# Patient Record
Sex: Male | Born: 1960
Health system: Southern US, Community
[De-identification: ages and names within clinical notes are randomized; demographics above are authoritative.]

## PROBLEM LIST (undated history)

## (undated) DIAGNOSIS — E119 Type 2 diabetes mellitus without complications: Secondary | ICD-10-CM

## (undated) DIAGNOSIS — E785 Hyperlipidemia, unspecified: Secondary | ICD-10-CM

## (undated) DIAGNOSIS — I10 Essential (primary) hypertension: Secondary | ICD-10-CM

## (undated) HISTORY — DX: Hyperlipidemia, unspecified: E78.5

## (undated) HISTORY — PX: COLONOSCOPY: SHX174

## (undated) HISTORY — DX: Type 2 diabetes mellitus without complications: E11.9

---

## 1998-04-06 ENCOUNTER — Encounter: Admission: RE | Admit: 1998-04-06 | Discharge: 1998-07-05 | Payer: Self-pay | Admitting: Family Medicine

## 2002-03-23 ENCOUNTER — Encounter: Admission: RE | Admit: 2002-03-23 | Discharge: 2002-06-21 | Payer: Self-pay | Admitting: Family Medicine

## 2002-07-06 ENCOUNTER — Encounter: Admission: RE | Admit: 2002-07-06 | Discharge: 2002-10-04 | Payer: Self-pay | Admitting: Family Medicine

## 2008-06-27 ENCOUNTER — Inpatient Hospital Stay (HOSPITAL_COMMUNITY): Admission: EM | Admit: 2008-06-27 | Discharge: 2008-07-03 | Payer: Self-pay | Admitting: Emergency Medicine

## 2008-06-29 ENCOUNTER — Ambulatory Visit: Payer: Self-pay | Admitting: Physical Medicine & Rehabilitation

## 2008-09-07 ENCOUNTER — Ambulatory Visit (HOSPITAL_COMMUNITY): Admission: RE | Admit: 2008-09-07 | Discharge: 2008-09-07 | Payer: Self-pay | Admitting: Urology

## 2010-02-27 ENCOUNTER — Encounter
Admission: RE | Admit: 2010-02-27 | Discharge: 2010-02-27 | Payer: Self-pay | Source: Home / Self Care | Attending: Sports Medicine | Admitting: Sports Medicine

## 2010-06-09 LAB — URINE CULTURE
Colony Count: NO GROWTH
Special Requests: NEGATIVE

## 2010-06-09 LAB — BASIC METABOLIC PANEL
CO2: 24 mEq/L (ref 19–32)
Chloride: 110 mEq/L (ref 96–112)
Creatinine, Ser: 0.99 mg/dL (ref 0.4–1.5)
GFR calc Af Amer: >60 mL/min (ref >60)
Glucose, Bld: 105 mg/dL — ABNORMAL HIGH (ref 70–99)
Potassium: 5 mEq/L (ref 3.5–5.1)
Sodium: 141 mEq/L (ref 135–145)

## 2010-06-09 LAB — GLUCOSE, CAPILLARY
Glucose-Capillary: 100 mg/dL — ABNORMAL HIGH (ref 70–99)
Glucose-Capillary: 116 mg/dL — ABNORMAL HIGH (ref 70–99)

## 2010-06-11 LAB — CARDIAC PANEL(CRET KIN+CKTOT+MB+TROPI)
CK, MB: 1.2 ng/mL (ref 0.3–4.0)
CK, MB: 1.2 ng/mL (ref 0.3–4.0)
Total CK: 543 U/L — ABNORMAL HIGH (ref 7–232)

## 2010-06-11 LAB — BASIC METABOLIC PANEL
BUN: 24 mg/dL — ABNORMAL HIGH (ref 6–23)
BUN: 26 mg/dL — ABNORMAL HIGH (ref 6–23)
Chloride: 102 mEq/L (ref 96–112)
Creatinine, Ser: 1.15 mg/dL (ref 0.4–1.5)
GFR calc Af Amer: >60 mL/min (ref >60)
GFR calc non Af Amer: >60 mL/min (ref >60)
Potassium: 5.1 mEq/L (ref 3.5–5.1)
Potassium: 5.2 mEq/L — ABNORMAL HIGH (ref 3.5–5.1)
Sodium: 141 mEq/L (ref 135–145)

## 2010-06-11 LAB — GLUCOSE, CAPILLARY
Glucose-Capillary: 137 mg/dL — ABNORMAL HIGH (ref 70–99)
Glucose-Capillary: 145 mg/dL — ABNORMAL HIGH (ref 70–99)
Glucose-Capillary: 91 mg/dL (ref 70–99)
Glucose-Capillary: 99 mg/dL (ref 70–99)

## 2010-06-11 LAB — CBC
HCT: 32.5 % — ABNORMAL LOW (ref 39.0–52.0)
HCT: 33.1 % — ABNORMAL LOW (ref 39.0–52.0)
Hemoglobin: 10.9 g/dL — ABNORMAL LOW (ref 13.0–17.0)
MCHC: 32.8 g/dL (ref 30.0–36.0)
MCV: 92.8 fL (ref 78.0–100.0)
Platelets: 295 10*3/uL (ref 150–400)
Platelets: 301 10*3/uL (ref 150–400)
RBC: 3.54 MIL/uL — ABNORMAL LOW (ref 4.22–5.81)
RBC: 3.57 MIL/uL — ABNORMAL LOW (ref 4.22–5.81)
WBC: 7.3 10*3/uL (ref 4.0–10.5)

## 2010-06-11 LAB — PROTIME-INR
INR: 1.8 — ABNORMAL HIGH (ref 0.00–1.49)
Prothrombin Time: 21.4 seconds — ABNORMAL HIGH (ref 11.6–15.2)
Prothrombin Time: 22.6 seconds — ABNORMAL HIGH (ref 11.6–15.2)

## 2010-06-11 LAB — BRAIN NATRIURETIC PEPTIDE: Pro B Natriuretic peptide (BNP): <30 pg/mL (ref 0.0–100.0)

## 2010-06-11 LAB — APTT: aPTT: 59 seconds — ABNORMAL HIGH (ref 24–37)

## 2010-06-12 LAB — PROTIME-INR
INR: 1 (ref 0.00–1.49)
INR: 1.2 (ref 0.00–1.49)
INR: 1.3 (ref 0.00–1.49)
Prothrombin Time: 15.2 seconds (ref 11.6–15.2)
Prothrombin Time: 16.4 seconds — ABNORMAL HIGH (ref 11.6–15.2)

## 2010-06-12 LAB — GLUCOSE, CAPILLARY
Glucose-Capillary: 115 mg/dL — ABNORMAL HIGH (ref 70–99)
Glucose-Capillary: 177 mg/dL — ABNORMAL HIGH (ref 70–99)
Glucose-Capillary: 187 mg/dL — ABNORMAL HIGH (ref 70–99)
Glucose-Capillary: 204 mg/dL — ABNORMAL HIGH (ref 70–99)
Glucose-Capillary: 92 mg/dL (ref 70–99)
Glucose-Capillary: 92 mg/dL (ref 70–99)

## 2010-06-12 LAB — CBC
MCV: 91.4 fL (ref 78.0–100.0)
Platelets: 220 10*3/uL (ref 150–400)
WBC: 8.2 10*3/uL (ref 4.0–10.5)

## 2010-07-16 NOTE — Discharge Summary (Signed)
NAMEEARSEL, SHOUSE NO.:  [id number]   MEDICAL RECORD NO.:  [id number]          PATIENT TYPE:  INP   LOCATION:  5023                         FACILITY:  [facility]   PHYSICIAN:  Eulas Post, MD    DATE OF BIRTH:  1960-07-11   DATE OF ADMISSION:  06/27/2008  DATE OF DISCHARGE:  06/30/2008                               DISCHARGE SUMMARY   ADMISSION DIAGNOSES:  1. Right ischium fracture.  2. Morbid obesity.   DISCHARGE DIAGNOSES:  1. Right ischium fracture.  2. Morbid obesity.  3. Diabetes.  4. Hypercholesterolemia.   DISCHARGE INSTRUCTIONS:  He can be weightbearing as tolerated and will  be on Coumadin for DVT prophylaxis for period of 1 month.   HOSPITAL COURSE:  Mr. Mccormick Macon is a 50 year old morbidly obese  gentleman who slipped while getting out of the shower and broke his  right ischium.  He was admitted to the hospital for pain control and  inability to ambulate.  He was given IV and subsequently p.o.  analgesics.  He did improve with therapy, but due to his morbid obesity  was still having significant difficulty getting out of the bed.  He did  not have sufficient support at home and required inpatient  rehabilitation.  Inpatient rehab was consulted, and we are currently  planning for him to be discharged to inpatient rehab.  The patient  benefited maximum from this hospital stay.  There were no complications.  He will plan to follow up with me in 2 weeks.      Eulas Post, MD  Electronically Signed     JPL/MEDQ  D:  06/30/2008  T:  06/30/2008  Job:  [id number]

## 2010-07-16 NOTE — Op Note (Signed)
NAMEGLENDELL, [name]             ACCOUNT NO.:  [id number]   MEDICAL RECORD NO.:  [id number]          PATIENT TYPE:  AMB   LOCATION:  DAY                          FACILITY:  [facility]   PHYSICIAN:  Martina Sinner, MD DATE OF BIRTH:  June 26, 1960   DATE OF PROCEDURE:  09/07/2008  DATE OF DISCHARGE:                               OPERATIVE REPORT   PREOPERATIVE DIAGNOSIS:  Urethral stricture.   POSTOPERATIVE DIAGNOSIS:  Urethral stricture.   PROCEDURE:  Cystoscopy, retrograde urethrogram, balloon dilation of  stricture, insertion of Foley catheter.   [name] has symptomatic stricture with frequency and  decreased flow.   The patient is prepped and draped in usual fashion.  Extra care was  taken with leg positioning to avoid risks.  He is very obese.  He was  given ciprofloxacin.   I initially cystoscoped the patient with a 21-French scope.  The penile  and most of the bulbar urethra were normal.  He had an 8-French  stricture near the membranous urethra at the proximal bulbar urethra.   I then used a modified open-end 6-French ureteral catheter to do  retrograde urethrogram using fluoroscopy.  Hard copy films were taken.  He had a stricture near the sphincter and dye was not reaching the  bladder which was concerning.  The rest of the urethra looked normal.   I then passed a sensor wire cystoscopically and under fluoroscopic  guidance up into the bladder.  There was initially resistance, but it  did finally go through nicely.  There is no question it curled up well  in the bladder.   Under fluoroscopic guidance, I passed the balloon dilation catheter with  appropriate markers just reaching the bladder neck.  I dilated for 5  minutes using contrast and fluoroscopic guidance.  I removed the  balloon.  He had a little bit of urethral bleeding.   I then used a 17-French scope to scope along the wire.  I had to  irrigate a little bit of a small clot.  There is no  question he had a  little bit of a perforation of the sponge  tissue between 3 and 6  o'clock.  I could stay next to the wire in negotiating into the bladder.   Urine was a little bit cloudy.  I sent it for culture.  The bladder  mucosa and trigone were normal.  He had normal prostatic urethra and  mild bilobar enlargement of the prostate.  Verumontanum was intact.  Just distal to his membranous urethra, he had his stricture but again  had been popped open with trauma associated with it.  In my opinion this  was due to the density of the stricture.  I did not keep cystoscoping  since I did not want to cause more scope trauma or extravasation.   Under fluoroscopic guidance, I passed a 16-French Council catheter  easily into the bladder.  It was draining well at the end of the case.   Because of the perforation into the sponge tissue from dilation of his  tight fibrotic stricture, I am going to leave  the Council catheter in  until next Wednesday.  I think it would be fine to remove the catheter  then.  I will keep him on antibiotics.  We will proceed accordingly.   In my opinion, this stricture did extend distal to the sphincter but was  right at the sphincter.  I did examine him in lithotomy position based  on his obesity, and I do not think he is a good urethroplasty candidate  because of his obesity but also because the stricture also involved the  membranous urethra.  Hopefully we can manage him with a dilation  protocol.           ______________________________  Martina Sinner, MD  Electronically Signed     SAM/MEDQ  D:  09/07/2008  T:  09/07/2008  Job:  [id number]

## 2010-07-16 NOTE — H&P (Signed)
Dillon Mcgee NO.:  [id number]   MEDICAL RECORD NO.:  [id number]          PATIENT TYPE:  INP   LOCATION:  5023                         FACILITY:  [facility]   PHYSICIAN:  Dillon Post, MD    DATE OF BIRTH:  March 29, 1960   DATE OF ADMISSION:  06/27/2008  DATE OF DISCHARGE:                              HISTORY & PHYSICAL   CHIEF COMPLAINT:  Right hip pain.   HISTORY:  Dillon Mcgee is a 50 year old morbidly obese gentleman,  who fell while getting out of the shower, and had acute onset of right  posterior buttock and hip pain.  He felt a crack.  He also noted  numbness in his genitalia region.  He was unable to walk and crawled his  way for help.  The date of injury was June 26, 2008.  He came to the  emergency room on June 27, 2008.  He continues to complain of right  posterior thigh and buttock pain.  He also has some mild pain over his  left anterior knee, but this is less significant.  The pain on the right  side is 10/10 and is described as sharp.  It is worse with  weightbearing.   PAST MEDICAL HISTORY:  Significant for:  1. Diabetes.  2. Hypertension.  3. Hypercholesterolemia.  4. Morbid obesity, weighing 370 pounds.   FAMILY HISTORY:  His mother has had non-Hodgkin lymphoma.  She also has  multiple people in the family with diabetes and heart disease.   SOCIAL HISTORY:  He is a nonsmoker and lives alone and works [employer].  He does mostly Company secretary.   REVIEW OF SYSTEMS:  Complete review of systems was performed and was  negative with the exception of the musculoskeletal complaints above.   PHYSICAL EXAMINATION:  CONSTITUTIONAL:  He is alert and oriented, in no  acute distress, and is certainly well developed and well nourished.  He  is fairly obese.  EYES:  His extraocular movements are intact.  LYMPHATICS:  He has no cervical or axillary lymphadenopathy.  ABDOMEN:  He has no rebound or guarding.  I do not  appreciate any  hepatosplenomegaly.  CARDIOVASCULAR:  He has mild pedal edema bilaterally.  RESPIRATORY:  He has no increase in respiratory efforts.  He has no  cyanosis.  PSYCHIATRIC:  His judgment and insight are intact.  SKIN:  He has no lesions around his buttocks or groin.  NEUROLOGIC:  His sensation seems to be intact throughout both legs.  Subjectively, he says that there is numbness that he had felt in his  groin that has resolved.  MUSCULOSKELETAL:  He has a contusion over the left anterior tibial  tubercle.  His left straight leg raise is intact, and he can do a right  straight leg raise as well.  This is with some difficulty.  He has  minimal pain with log roll maneuver, but has exquisite pain to palpation  posteriorly along the buttocks.   His x-rays demonstrate an ischium fracture.  The CT scan does not  definitively demonstrate extension into the acetabulum, however,  even if  it does go into the acetabulum, it is well below the weightbearing dome.   IMPRESSION:  Right ischium fracture with morbid obesity.   PLAN:  Given his inability to walk, and his significant pain, he will be  admitted for IV pain medication and physical therapy.  I have discussed  the risks, benefits, and alternatives to surgical versus nonsurgical  intervention with Dillon Mcgee, and I am currently recommending  nonsurgical intervention.  He should be able to heal this over time, but  it certainly will take at least 4-6 weeks for him to get significantly  better.  He will be out of work for a fairly extended period of time.  We will plan to admit him and get physical therapy.  I will allow them  to be weightbearing as tolerated.      Dillon Post, MD  Electronically Signed     JPL/MEDQ  D:  06/27/2008  T:  06/28/2008  Job:  [id number]

## 2010-07-18 IMAGING — CR DG HIP COMPLETE 2+V*R*
3 series · 3 of 3 positions shown · non-contrast
Comparison: None.

CLINICAL DATA: Fall.

RIGHT HIP - COMPLETE 2+ VIEW

[t pelvis a.p. *]
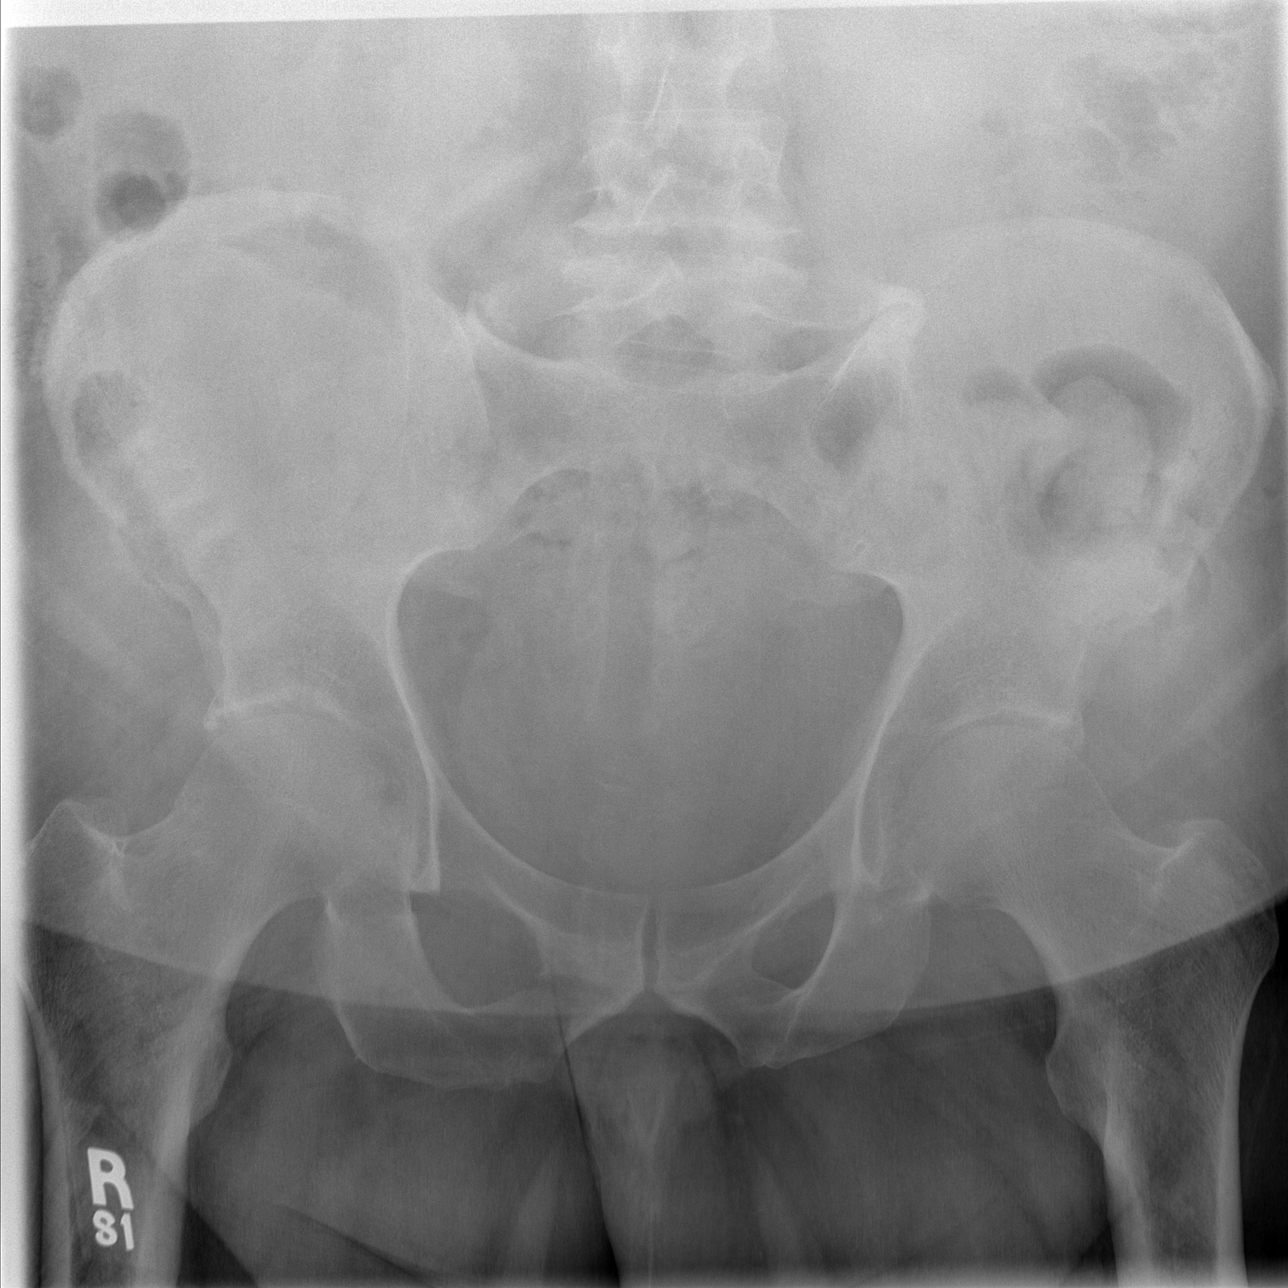

[t hip ap right *]
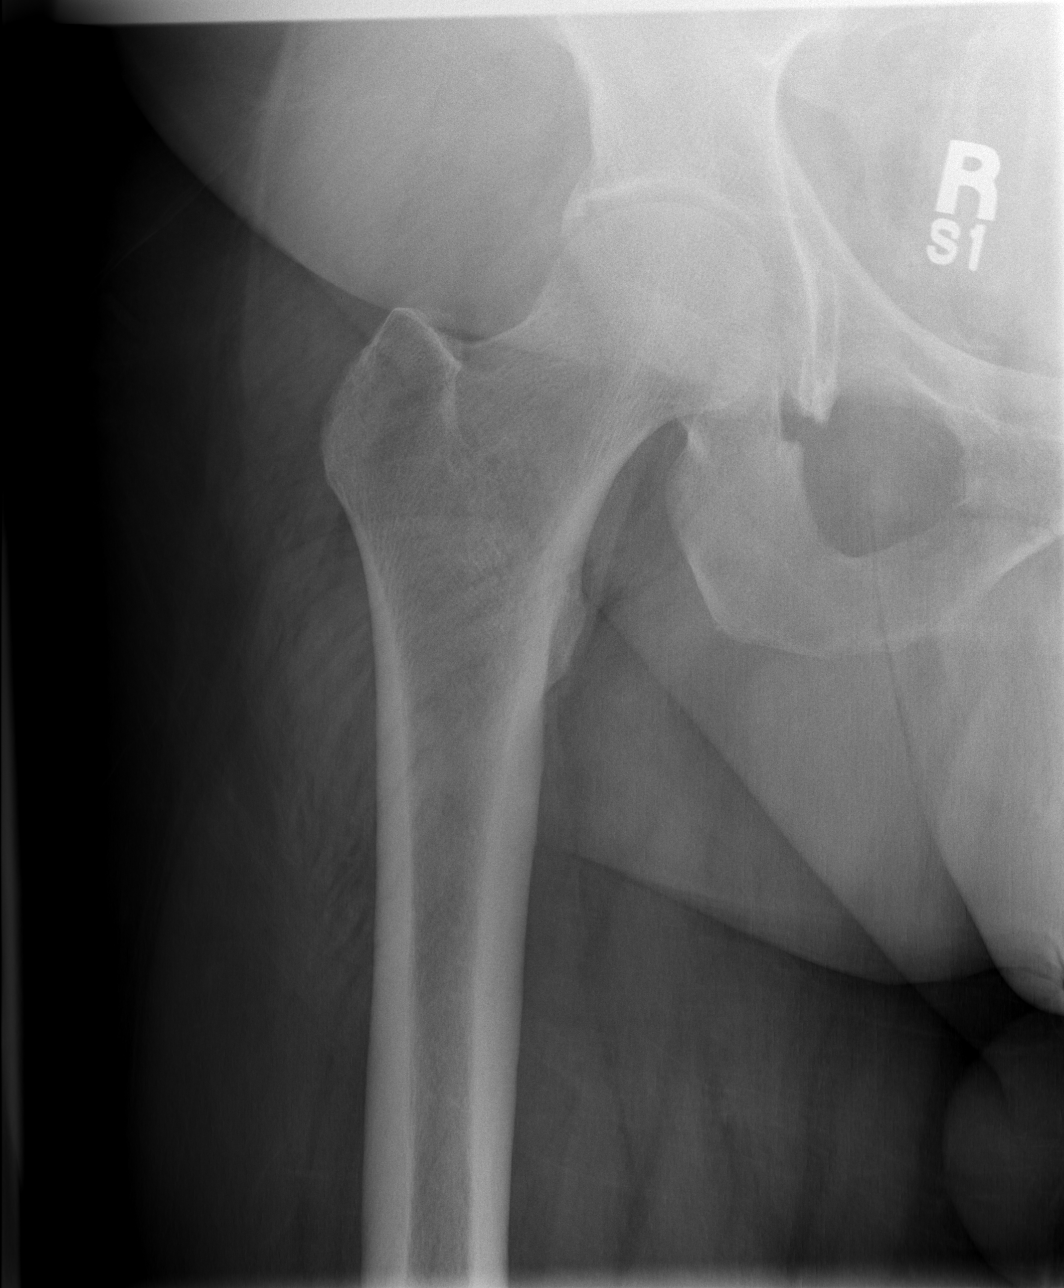

[t hip frog leg right *]
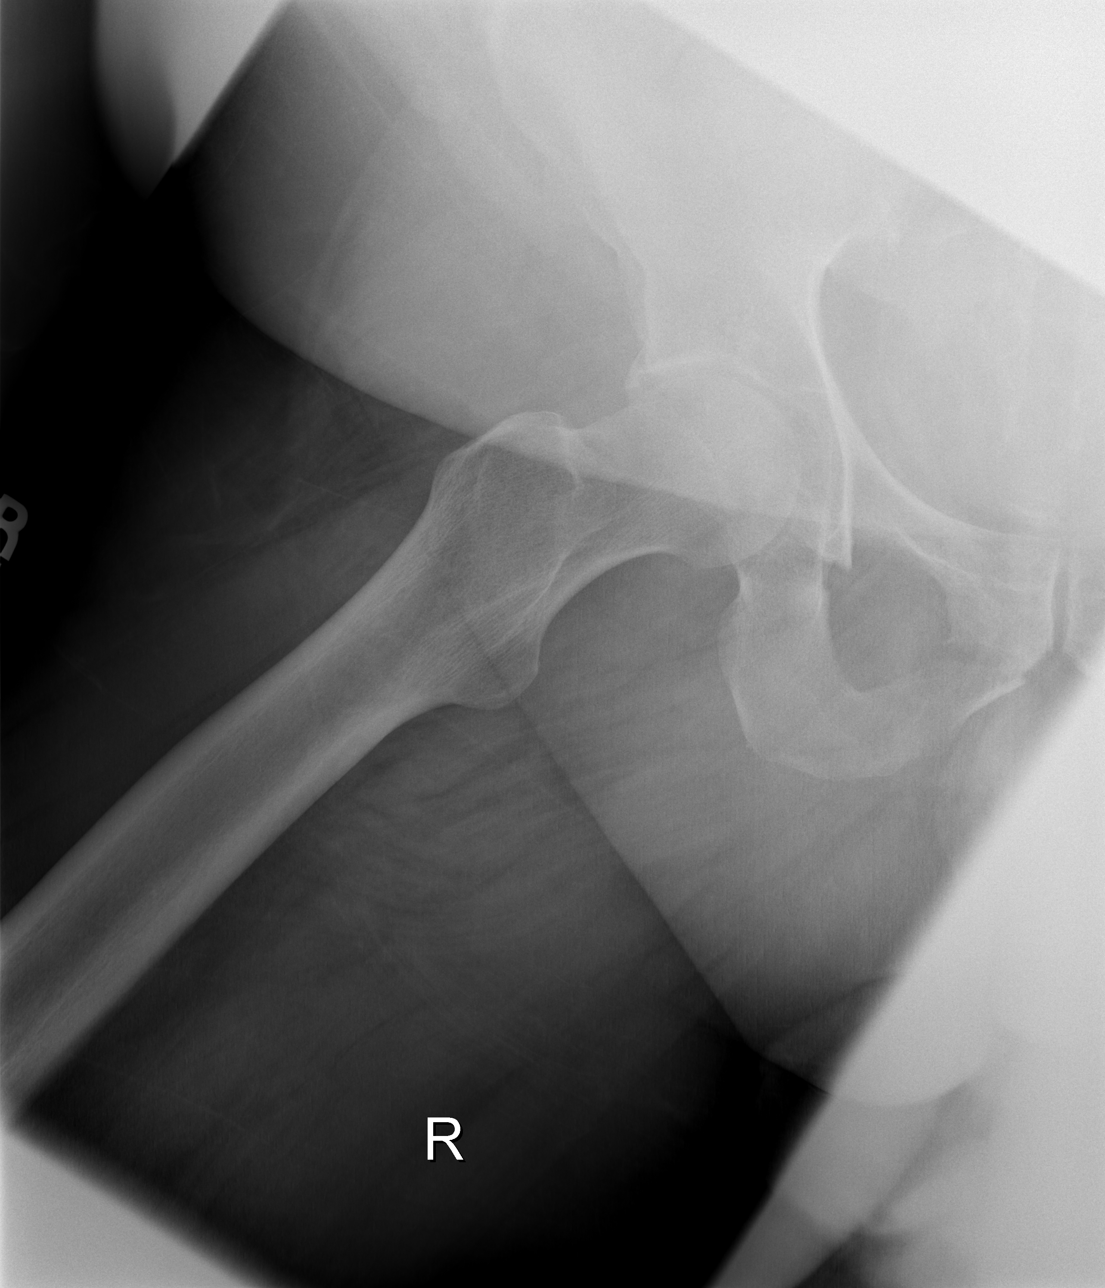

[3 of 3 positions shown; findings below may reference images not displayed]

FINDINGS: There is a fracture of the right parasymphyseal pubis
with mild displacement.  There is a fracture of the right medial
acetabulum.

There is joint space narrowing and mild moderate degenerative
change in both hip joints.  There is no hip fracture.

 There is gas in the soft tissues of the right thigh related to
laceration.
IMPRESSION: Fracture of the right acetabulum and pubic bone.  Gas is present in
the soft tissues.  Further evaluation with CT is suggested.

## 2010-07-19 NOTE — Discharge Summary (Signed)
NAMECAISON, HEARN NO.:  [id number]   MEDICAL RECORD NO.:  [id number]          PATIENT TYPE:  INP   LOCATION:  5023                         FACILITY:  [facility]   PHYSICIAN:  Eulas Post, MD    DATE OF BIRTH:  10/20/1960   DATE OF ADMISSION:  06/27/2008  DATE OF DISCHARGE:  07/03/2008                               DISCHARGE SUMMARY   ADMISSION DIAGNOSIS:  Right ischial tuberosity fracture.   DISCHARGE DIAGNOSIS:  Right ischial tuberosity fracture.   DISCHARGE INSTRUCTIONS:  Weightbearing as tolerated with in-home  physical therapy.   HOSPITAL COURSE:  [name] is a 50 year old gentleman who  slipped and fell and broke his ischial tuberosity.  He was admitted to  the hospital for pain control and mobility work with physical therapy.  He was given IV and subsequently p.o. analgesics.  He was making slow  progress.  He also has morbid obesity.  His sensation was intact  throughout his hospital stay.  He was given Coumadin and sequential  compression devices for DVT prophylaxis.  He initially may have been a  candidate for rehab versus skilled nursing, but ultimately was  discharged home.  He benefited maximally from this hospital stay and is  planned to follow up with me in 1-2 weeks.  There were no complications.      Eulas Post, MD  Electronically Signed     JPL/MEDQ  D:  08/03/2008  T:  08/04/2008  Job:  [id number]

## 2010-09-10 ENCOUNTER — Observation Stay (HOSPITAL_COMMUNITY)
Admission: EM | Admit: 2010-09-10 | Discharge: 2010-09-11 | DRG: 568 | Disposition: A | Discharge status: Home / Self Care | Payer: BC Managed Care – PPO | Attending: Internal Medicine | Admitting: Internal Medicine

## 2010-09-10 DIAGNOSIS — E1100 Type 2 diabetes mellitus with hyperosmolarity without nonketotic hyperglycemic-hyperosmolar coma (NKHHC): Principal | ICD-10-CM | POA: Insufficient documentation

## 2010-09-10 DIAGNOSIS — G8929 Other chronic pain: Secondary | ICD-10-CM | POA: Insufficient documentation

## 2010-09-10 DIAGNOSIS — N179 Acute kidney failure, unspecified: Secondary | ICD-10-CM | POA: Insufficient documentation

## 2010-09-10 DIAGNOSIS — I10 Essential (primary) hypertension: Secondary | ICD-10-CM | POA: Insufficient documentation

## 2010-09-10 DIAGNOSIS — L600 Ingrowing nail: Secondary | ICD-10-CM | POA: Insufficient documentation

## 2010-09-10 DIAGNOSIS — E669 Obesity, unspecified: Secondary | ICD-10-CM | POA: Insufficient documentation

## 2010-09-10 DIAGNOSIS — E860 Dehydration: Secondary | ICD-10-CM | POA: Insufficient documentation

## 2010-09-10 DIAGNOSIS — E785 Hyperlipidemia, unspecified: Secondary | ICD-10-CM | POA: Insufficient documentation

## 2010-09-10 LAB — URINALYSIS, ROUTINE W REFLEX MICROSCOPIC
Leukocytes, UA: NEGATIVE
Nitrite: NEGATIVE
Specific Gravity, Urine: 1.031 — ABNORMAL HIGH (ref 1.005–1.030)
pH: 5.5 (ref 5.0–8.0)

## 2010-09-10 LAB — BASIC METABOLIC PANEL
Chloride: 96 mEq/L (ref 96–112)
GFR calc Af Amer: >60 mL/min (ref >60)
Potassium: 4.4 mEq/L (ref 3.5–5.1)
Sodium: 134 mEq/L — ABNORMAL LOW (ref 135–145)

## 2010-09-10 LAB — CBC
HCT: 40.4 % (ref 39.0–52.0)
Platelets: 190 10*3/uL (ref 150–400)
RDW: 13.7 % (ref 11.5–15.5)
WBC: 8 10*3/uL (ref 4.0–10.5)

## 2010-09-10 LAB — DIFFERENTIAL
Basophils Absolute: 0.1 10*3/uL (ref 0.0–0.1)
Eosinophils Absolute: 0.1 10*3/uL (ref 0.0–0.7)
Eosinophils Relative: 2 % (ref 0–5)
Lymphocytes Relative: 22 % (ref 12–46)

## 2010-09-10 LAB — GLUCOSE, CAPILLARY: Glucose-Capillary: 466 mg/dL — ABNORMAL HIGH (ref 70–99)

## 2010-09-10 LAB — URINE MICROSCOPIC-ADD ON

## 2010-09-11 ENCOUNTER — Emergency Department (HOSPITAL_COMMUNITY): Payer: BC Managed Care – PPO

## 2010-09-11 LAB — LIPID PANEL
Cholesterol: 159 mg/dL (ref 0–200)
HDL: 52 mg/dL (ref >39)
Triglycerides: 167 mg/dL — ABNORMAL HIGH (ref <150)

## 2010-09-11 LAB — CBC
MCH: 31 pg (ref 26.0–34.0)
Platelets: 188 10*3/uL (ref 150–400)
RBC: 4.1 MIL/uL — ABNORMAL LOW (ref 4.22–5.81)
RDW: 14 % (ref 11.5–15.5)
WBC: 7.3 10*3/uL (ref 4.0–10.5)

## 2010-09-11 LAB — POCT I-STAT 3, ART BLOOD GAS (G3+)
O2 Saturation: 96 %
pCO2 arterial: 37.8 mmHg (ref 35.0–45.0)
pH, Arterial: 7.416 (ref 7.350–7.450)

## 2010-09-11 LAB — COMPREHENSIVE METABOLIC PANEL
AST: 29 U/L (ref 0–37)
BUN: 20 mg/dL (ref 6–23)
CO2: 22 mEq/L (ref 19–32)
Calcium: 8.7 mg/dL (ref 8.4–10.5)
Creatinine, Ser: 0.96 mg/dL (ref 0.50–1.35)
GFR calc Af Amer: >60 mL/min (ref >60)
GFR calc non Af Amer: >60 mL/min (ref >60)
Glucose, Bld: 238 mg/dL — ABNORMAL HIGH (ref 70–99)
Total Bilirubin: 0.3 mg/dL (ref 0.3–1.2)

## 2010-09-11 LAB — CK TOTAL AND CKMB (NOT AT ARMC)
CK, MB: 2.8 ng/mL (ref 0.3–4.0)
Relative Index: 2.1 (ref 0.0–2.5)
Total CK: 132 U/L (ref 7–232)

## 2010-09-11 LAB — GLUCOSE, CAPILLARY: Glucose-Capillary: 238 mg/dL — ABNORMAL HIGH (ref 70–99)

## 2010-09-11 LAB — DRUGS OF ABUSE SCREEN W/O ALC, ROUTINE URINE
Amphetamine Screen, Ur: NEGATIVE
Barbiturate Quant, Ur: NEGATIVE
Benzodiazepines.: NEGATIVE
Methadone: NEGATIVE
Phencyclidine (PCP): NEGATIVE

## 2010-09-15 NOTE — Discharge Summary (Signed)
NAMECAYLIN, Dillon Mcgee NO.:  [id number]  MEDICAL RECORD NO.:  [id number]  LOCATION:  3014                         FACILITY:  [facility]  PHYSICIAN:  Lonia Blood, M.D.       DATE OF BIRTH:  Sep 21, 1960  DATE OF ADMISSION:  09/10/2010 DATE OF DISCHARGE:  09/11/2010                              DISCHARGE SUMMARY   PRIMARY CARE PHYSICIAN:  Carola J. Gerri Spore, MD, [place] Sprang, [place].  DISCHARGE DIAGNOSES: 1. Uncontrolled diabetes mellitus type 2 with hyperosmolar nonketotic     syndrome, resolved. 2. Hypertension. 3. Hyperlipidemia. 4. Obesity. 5. Chronic low back pain.  DISCHARGE MEDICATIONS: 1. Actos 15 mg daily. 2. Aspirin 81 mg daily. 3. Cephalexin 500 mg three times a day. 4. Doxycycline 50 mg daily. 5. Iron 325 mg daily. 6. Gabapentin 300 mg twice a day. 7. Glipizide 5 mg daily. 8. Hydrochlorothiazide 12.5 mg daily. 9. Levemir 10 units at bedtime. 10.Metformin 1000 mg twice a day. 11.Naproxen 500 mg daily as needed. 12.Niaspan 1000 mg 2 tablets daily. 13.Sleep-Aid 1 tablet at bedtime as needed. 14.Slow magnesium 1 tablet daily.  CONDITION ON DISCHARGE:  Dillon Mcgee was discharged in good condition. At the time of discharge, his vital signs indicated temperature 98.1, heart rate 90, respirations 18, blood pressure 138/81, saturation 98% on room air.  His discharge CBG was 220.  The patient was instructed to follow up with his primary care physician this week or the next week.  PROCEDURE THIS ADMISSION:  No procedures done.  CONSULTATION:  No consultation obtained.  HISTORY AND PHYSICAL:  Refer to dictation H and P done by Dr. Toniann Fail.  HOSPITAL COURSE:  Dillon Mcgee is a 50 year old gentleman with diabetes mellitus type 2 who was admitted to hospital because he was running persistent elevated capillary blood glucose at home.  He was also becoming weak due to a dehydration from polyuria and polydipsia.  Dillon Mcgee was found upon admission to  have a glucose level of 549 with bicarbonate level of 21.  He was not in DKA suggesting that he was having uncontrolled diabetes mellitus type 2.  I do think that at this point in time though he probably is insulin requiring into his type 2 diabetes and that it might be wise for now to treat him with insulin in addition to his oral anti hypoglycemics.  The patient may require the measurement of GAD antibodies to decide if he is total insulin dependent or if he gets control on his weight an attempt could be made of discontinuing the insulin in a month or two.  Otherwise, the patient has not had any other acute illnesses identified to suggest that he has an infection or stroke or an MI to run his sugars high.  I have given him a dose of Lantus and meal coverage with his food, his CBGs corrected to levels of 200.  He was started on 10 units of Levemir at bedtime dose and he will follow up with his primary care physician for further adjustment of the insulin.     Lonia Blood, M.D.     Mcgee  D:  09/11/2010  T:  09/12/2010  Job:  [id number] cc:   Chesley Mires  Virl Son, M.D.  Electronically Signed by Lonia Blood M.D. on 09/15/2010 07:57:51 AM

## 2010-09-17 NOTE — H&P (Signed)
NAMEJIMMYLEE, [name] NO.:  [id number]  MEDICAL RECORD NO.:  [id number]  LOCATION:  [facility]                         FACILITY:  [facility]  PHYSICIAN:  Eduard Clos, MDDATE OF BIRTH:  Oct 07, 1960  DATE OF ADMISSION:  09/10/2010 DATE OF DISCHARGE:                             HISTORY & PHYSICAL   PRIMARY CARE PHYSICIAN:  [facility].  CHIEF COMPLAINT:  Increased blood sugar.  HISTORY OF PRESENT ILLNESS:  A 50 year old male with known history of diabetes mellitus type 2, hypertension, hyperlipidemia who has had ingrown toenail removed 2 days ago.  Was experiencing increased urination and weakness for last 2 days and had gone to his PCP yesterday who checked his hemoglobin A1c, which was found to be 12 and he stated that it was just 6 couple of months ago and his blood sugar also was found to be very high and more than 500 and was instructed to go to the ER because of his uncontrolled diabetes and also possible dehydration.  The patient denies any loss of consciousness, any focal deficit, any visual symptoms, difficulty speaking or swallowing.  Denies any chest pain, shortness of breath, or any palpitation though his primary care physician did find that he was a little bit tachycardic.  Denies any abdominal pain, dysuria, discharge, or diarrhea.  In the ER, the patient was found to have high blood sugar and anion gap of 17.  UA was found to have ketones.  He was given 2 L bolus of fluids and blood sugars rechecked, it was still very high.  He was started on IV glucose sterilizer.  At this time, his blood sugar is decreasing.  The patient has been admitted for further observation.  PAST MEDICAL HISTORY: 1. Hypertension. 2. Diabetes mellitus type 2. 3. Hyperlipidemia. 4. Obesity. 5. Chronic low-back pain for which he has received steroid shot low in     the low back 2 months ago.  SURGICAL HISTORY:  Ureteral dilatation 2 years ago.  MEDICATIONS PRIOR  TO ADMISSION: 1. Aspirin 81 mg p.o. daily. 2. Cephalexin 500 mg p.o. three times daily. 3. Doxycycline 50 mg daily. 4. Naproxen. 5. Gabapentin 300 mg twice daily. 6. Glipizide 5 mg p.o. daily. 7. Actos 50 mg p.o. daily. 8. Metformin 1000 mg twice daily. 9. Aspirin 1000 mg 2 tablets daily. 10.Hydrochlorothiazide 12.5 mg p.o. daily.  ALLERGIES:  No known drug allergies.  FAMILY HISTORY:  Significant for diabetes mellitus type 2, hypertension.  SOCIAL HISTORY:  The patient denies smoking cigarettes, drinking alcohol, or using illegal drugs.  REVIEW OF SYSTEMS:  As per history of present illness, nothing else significant.  PHYSICAL EXAMINATION:  GENERAL:  The patient examined at bedside not in acute distress . VITAL SIGNS:  Blood pressure is 138/88, pulse is around 100 per minute, temperature is 98.5, respiration 18 per minute, O2 sat 98%. HEENT: Anicteric.  No pallor.  No discharge from ears, eyes, nose, or mouth. CHEST:  Bilateral air entry present.  No rhonchi, no crepitation. HEART:  S1 and S2 heard. ABDOMEN:  Soft, nontender.  Good bowel sounds. CNS:  The patient is alert, awake, and oriented to time, place, and person.  Moves upper and lower  extremities 5/5. EXTREMITIES:  Peripheral pulses felt.  No edema.  The left great toe is dressed.  Has mild discharge from it.  There is no localized tenderness.  LABORATORY DATA:  EKG shows normal sinus rhythm with RBBB pattern. Heart rate of 88 beats per minute with nonspecific ST-T changes.  ABGs show a pH of 7.41, pCO2 of 37.8, pO2 of 81, oxygen saturation is 96%. CBC; WBC is 8, hemoglobin is 13.3, hematocrit is 40.4, platelets 190. Basic metabolic panel; sodium 134, potassium 4.4, chloride 96, carbon dioxide 21, glucose 549, anion gap is 17, glucose 549, BUN 25, creatinine 1.4, calcium 8.8, CK is 132, MB is 2.8, troponin less than 0.3.  UA is clear, glucose 1000, ketones 40, negative for nitrites  and leukocytes.  ASSESSMENT: 1. Uncontrolled diabetes mellitus type 2. 2. Dehydration with acute renal failure. 3. Recent left ingrown toenail removal 2 days ago, is on antibiotics. 4. History of hypertension. 5. History of hyperlipidemia. 6. History of chronic low-back pain. 7. History of obesity.  PLAN: 1. At this time, admit the patient to telemetry. 2. For his uncontrolled diabetes mellitus type 2, the patient's     initial anion gap was high, but at this time ABG shows normal pH.     His blood sugar decreased to 239.  At this time, I am going to     discontinue the glucose stabilizer.  Start him on Lantus 10 units,     place the patient on moderate sliding scale and the patient to use     insulin and we will check hemoglobin A1c. 3. For his acute renal failure and dehydration, I am going to continue     his IV hydration.  We will recheck labs again in a.m. which at this     time I think will improve after hydrating. 4. There is still some discharge from his left great toe, which is     expected, but at this time I am going to get x-ray     of the left great toe to make sure there is no definite     osteomyelitis forming there, but at this time he does not have any     fever, chills, or any leukocytosis.  He is still on doxycycline and     Keppra, which we will continue. 5. Further recommendations based on test order and clinical course.     Eduard Clos, MD     ANK/MEDQ  D:  09/11/2010  T:  09/11/2010  Job:  [id number]  Electronically Signed by Midge Minium MD on 09/17/2010 07:33:38 AM

## 2010-12-02 ENCOUNTER — Ambulatory Visit (INDEPENDENT_AMBULATORY_CARE_PROVIDER_SITE_OTHER): Payer: BC Managed Care – PPO | Admitting: Ophthalmology

## 2010-12-02 DIAGNOSIS — H2510 Age-related nuclear cataract, unspecified eye: Secondary | ICD-10-CM

## 2010-12-02 DIAGNOSIS — E11319 Type 2 diabetes mellitus with unspecified diabetic retinopathy without macular edema: Secondary | ICD-10-CM

## 2010-12-02 DIAGNOSIS — H43819 Vitreous degeneration, unspecified eye: Secondary | ICD-10-CM

## 2011-06-09 ENCOUNTER — Ambulatory Visit (INDEPENDENT_AMBULATORY_CARE_PROVIDER_SITE_OTHER): Payer: BC Managed Care – PPO | Admitting: Ophthalmology

## 2011-06-09 DIAGNOSIS — H43819 Vitreous degeneration, unspecified eye: Secondary | ICD-10-CM

## 2011-06-09 DIAGNOSIS — H2510 Age-related nuclear cataract, unspecified eye: Secondary | ICD-10-CM

## 2011-06-09 DIAGNOSIS — E1165 Type 2 diabetes mellitus with hyperglycemia: Secondary | ICD-10-CM

## 2011-06-09 DIAGNOSIS — E11319 Type 2 diabetes mellitus with unspecified diabetic retinopathy without macular edema: Secondary | ICD-10-CM

## 2011-06-09 DIAGNOSIS — E1139 Type 2 diabetes mellitus with other diabetic ophthalmic complication: Secondary | ICD-10-CM

## 2011-12-09 ENCOUNTER — Ambulatory Visit (INDEPENDENT_AMBULATORY_CARE_PROVIDER_SITE_OTHER): Payer: BC Managed Care – PPO | Admitting: Ophthalmology

## 2011-12-09 DIAGNOSIS — E1139 Type 2 diabetes mellitus with other diabetic ophthalmic complication: Secondary | ICD-10-CM

## 2011-12-09 DIAGNOSIS — E1165 Type 2 diabetes mellitus with hyperglycemia: Secondary | ICD-10-CM

## 2011-12-09 DIAGNOSIS — H43819 Vitreous degeneration, unspecified eye: Secondary | ICD-10-CM

## 2011-12-09 DIAGNOSIS — I10 Essential (primary) hypertension: Secondary | ICD-10-CM

## 2011-12-09 DIAGNOSIS — H35039 Hypertensive retinopathy, unspecified eye: Secondary | ICD-10-CM

## 2011-12-09 DIAGNOSIS — H2510 Age-related nuclear cataract, unspecified eye: Secondary | ICD-10-CM

## 2011-12-09 DIAGNOSIS — E11319 Type 2 diabetes mellitus with unspecified diabetic retinopathy without macular edema: Secondary | ICD-10-CM

## 2012-06-08 ENCOUNTER — Ambulatory Visit (INDEPENDENT_AMBULATORY_CARE_PROVIDER_SITE_OTHER): Payer: BC Managed Care – PPO | Admitting: Ophthalmology

## 2012-06-08 DIAGNOSIS — I10 Essential (primary) hypertension: Secondary | ICD-10-CM

## 2012-06-08 DIAGNOSIS — H43819 Vitreous degeneration, unspecified eye: Secondary | ICD-10-CM

## 2012-06-08 DIAGNOSIS — H2510 Age-related nuclear cataract, unspecified eye: Secondary | ICD-10-CM

## 2012-06-08 DIAGNOSIS — E1139 Type 2 diabetes mellitus with other diabetic ophthalmic complication: Secondary | ICD-10-CM

## 2012-06-08 DIAGNOSIS — E11319 Type 2 diabetes mellitus with unspecified diabetic retinopathy without macular edema: Secondary | ICD-10-CM

## 2012-06-08 DIAGNOSIS — H35039 Hypertensive retinopathy, unspecified eye: Secondary | ICD-10-CM

## 2012-12-08 ENCOUNTER — Ambulatory Visit (INDEPENDENT_AMBULATORY_CARE_PROVIDER_SITE_OTHER): Payer: BC Managed Care – PPO | Admitting: Ophthalmology

## 2014-01-30 ENCOUNTER — Ambulatory Visit: Payer: BC Managed Care – PPO | Attending: Internal Medicine

## 2014-02-06 ENCOUNTER — Ambulatory Visit: Payer: BC Managed Care – PPO | Attending: Internal Medicine | Admitting: Internal Medicine

## 2014-02-06 ENCOUNTER — Encounter: Payer: Self-pay | Admitting: Internal Medicine

## 2014-02-06 VITALS — BP 123/85 | HR 80 | Temp 98.0°F | Resp 16 | Wt 376.2 lb

## 2014-02-06 DIAGNOSIS — E119 Type 2 diabetes mellitus without complications: Secondary | ICD-10-CM | POA: Diagnosis present

## 2014-02-06 DIAGNOSIS — E139 Other specified diabetes mellitus without complications: Secondary | ICD-10-CM | POA: Insufficient documentation

## 2014-02-06 DIAGNOSIS — Z794 Long term (current) use of insulin: Secondary | ICD-10-CM | POA: Insufficient documentation

## 2014-02-06 DIAGNOSIS — Z7982 Long term (current) use of aspirin: Secondary | ICD-10-CM | POA: Diagnosis not present

## 2014-02-06 DIAGNOSIS — E785 Hyperlipidemia, unspecified: Secondary | ICD-10-CM | POA: Diagnosis not present

## 2014-02-06 DIAGNOSIS — I10 Essential (primary) hypertension: Secondary | ICD-10-CM | POA: Diagnosis not present

## 2014-02-06 DIAGNOSIS — Z139 Encounter for screening, unspecified: Secondary | ICD-10-CM

## 2014-02-06 LAB — POCT GLYCOSYLATED HEMOGLOBIN (HGB A1C): HEMOGLOBIN A1C: 6.5

## 2014-02-06 LAB — GLUCOSE, POCT (MANUAL RESULT ENTRY): POC GLUCOSE: 91 mg/dL (ref 70–99)

## 2014-02-06 NOTE — Progress Notes (Signed)
Patient Demographics  Dillon Mcgee, is a 53 y.o. male  WNU:[id number]  IHK:[id number]  DOB - 06/11/1960  CC:  Chief Complaint  Patient presents with  . Establish Care       HPI: Dillon Mcgee is a 53 y.o. male here today to establish medical care.patient has history of diabetes hypertension hyperlipidemia, patient currently denies any acute symptoms denies any hypoglycemic symptoms, his diabetes has been well-controlled currently on metformin 1 g twice a day, Actos 15 mg, NovoLog 10 units 3 times a day, today's hemoglobin A1c 6.5% , as per patient he had a colonoscopy done 5 years ago, will obtain medical records, has been compliant in taking his medications. Patient denies smoking cigarettes drinking alcohol or any illicit drugs, as per patient his trying to lose weight and does exercise regularly. Patient has No headache, No chest pain, No abdominal pain - No Nausea, No new weakness tingling or numbness, No Cough - SOB.  No Known Allergies Past Medical History  Diagnosis Date  . Diabetes mellitus without complication   . Hyperlipidemia    No current outpatient prescriptions on file prior to visit.   No current facility-administered medications on file prior to visit.   Family History  Problem Relation Age of Onset  . Heart disease Maternal Aunt   . Diabetes Maternal Uncle    History   Social History  . Marital Status: Single    Spouse Name: N/A    Number of Children: N/A  . Years of Education: N/A   Occupational History  . Not on file.   Social History Main Topics  . Smoking status: Never Smoker   . Smokeless tobacco: Not on file  . Alcohol Use: No  . Drug Use: Not on file  . Sexual Activity: Not on file   Other Topics Concern  . Not on file   Social History Narrative  . No narrative on file    Review of Systems: Constitutional: Negative for fever, chills, diaphoresis, activity change, appetite change and fatigue. HENT: Negative for ear pain,  nosebleeds, congestion, facial swelling, rhinorrhea, neck pain, neck stiffness and ear discharge.  Eyes: Negative for pain, discharge, redness, itching and visual disturbance. Respiratory: Negative for cough, choking, chest tightness, shortness of breath, wheezing and stridor.  Cardiovascular: Negative for chest pain, palpitations and leg swelling. Gastrointestinal: Negative for abdominal distention. Genitourinary: Negative for dysuria, urgency, frequency, hematuria, flank pain, decreased urine volume, difficulty urinating and dyspareunia.  Musculoskeletal: Negative for back pain, joint swelling, arthralgia and gait problem. Neurological: Negative for dizziness, tremors, seizures, syncope, facial asymmetry, speech difficulty, weakness, light-headedness, numbness and headaches.  Hematological: Negative for adenopathy. Does not bruise/bleed easily. Psychiatric/Behavioral: Negative for hallucinations, behavioral problems, confusion, dysphoric mood, decreased concentration and agitation.    Objective:   Filed Vitals:   02/06/14 1025  BP: 123/85  Pulse: 80  Temp: 98 F (36.7 C)  Resp: 16    Physical Exam: Constitutional: obese male sitting comfortably not in acute distress. HENT: Normocephalic, atraumatic, External right and left ear normal. Oropharynx is clear and moist.  Eyes: Conjunctivae and EOM are normal. PERRLA, no scleral icterus. Neck: Normal ROM. Neck supple. No JVD. No tracheal deviation. No thyromegaly. CVS: RRR, S1/S2 +, no murmurs, no gallops, no carotid bruit.  Pulmonary: Effort and breath sounds normal, no stridor, rhonchi, wheezes, rales.  Abdominal: Soft. BS +, no distension, tenderness, rebound or guarding.  Musculoskeletal: Normal range of motion. No edema and no tenderness.  Neuro: Alert. Normal reflexes,  muscle tone coordination. No cranial nerve deficit. Skin: Skin is warm and dry. No rash noted. Not diaphoretic. No erythema. No pallor. Psychiatric: Normal mood and  affect. Behavior, judgment, thought content normal.  Lab Results  Component Value Date   WBC 7.3 09/11/2010   HGB 12.7* 09/11/2010   HCT 39.0 09/11/2010   MCV 95.1 09/11/2010   PLT 188 09/11/2010   Lab Results  Component Value Date   CREATININE 0.96 09/11/2010   BUN 20 09/11/2010   NA 142 09/11/2010   K 3.9 09/11/2010   CL 103 09/11/2010   CO2 22 09/11/2010    Lab Results  Component Value Date   HGBA1C 6.5 02/06/2014   Lipid Panel     Component Value Date/Time   CHOL 159 09/11/2010 0512   TRIG 167* 09/11/2010 0512   HDL 52 09/11/2010 0512   CHOLHDL 3.1 09/11/2010 0512   VLDL 33 09/11/2010 0512   LDLCALC 74 09/11/2010 0512       Assessment and plan:   1. Other specified diabetes mellitus without complications Results for orders placed or performed in visit on 02/06/14  Glucose (CBG)  Result Value Ref Range   POC Glucose 91 70 - 99 mg/dl  HgB [id number]  Result Value Ref Range   Hemoglobin A1C 6.5     Diabetes is well controlled, continue with metformin, Actos and NovoLog, advise for diabetes meal planning, repeat A1c in 3 months. - COMPLETE METABOLIC PANEL WITH GFR; Future  2. Hyperlipidemia Currently patient is on Pravachol 20 mg daily, will check fasting lipid panel. - Lipid panel; Future  3. Obesity, morbid Advise for diet and exercise. - TSH; Future  4. Essential hypertension Blood pressure is well controlled, currently he's on hydrochlorothiazide 12.5 mg daily.  5. Screening Ordered baseline blood work. - CBC with Differential; Future - TSH; Future - Vit D  25 hydroxy (rtn osteoporosis monitoring); Future      Health Maintenance -Colonoscopy: uptodate obtain records   -Vaccinations: uptodate with the flu shot   Return in about 3 months (around 05/08/2014) for diabetes, hypertension, hyperipidemia, fasting blood work in 2 weeks Lab visit.   Doris CheadleADVANI, Dillon Partain, MD

## 2014-02-06 NOTE — Progress Notes (Signed)
Patient here to establish care  Has history of diabetes and high cholesterol

## 2014-02-06 NOTE — Patient Instructions (Signed)
Diabetes Mellitus and Food It is important for you to manage your blood sugar (glucose) level. Your blood glucose level can be greatly affected by what you eat. Eating healthier foods in the appropriate amounts throughout the day at about the same time each day will help you control your blood glucose level. It can also help slow or prevent worsening of your diabetes mellitus. Healthy eating may even help you improve the level of your blood pressure and reach or maintain a healthy weight.  HOW CAN FOOD AFFECT ME? Carbohydrates Carbohydrates affect your blood glucose level more than any other type of food. Your dietitian will help you determine how many carbohydrates to eat at each meal and teach you how to count carbohydrates. Counting carbohydrates is important to keep your blood glucose at a healthy level, especially if you are using insulin or taking certain medicines for diabetes mellitus. Alcohol Alcohol can cause sudden decreases in blood glucose (hypoglycemia), especially if you use insulin or take certain medicines for diabetes mellitus. Hypoglycemia can be a life-threatening condition. Symptoms of hypoglycemia (sleepiness, dizziness, and disorientation) are similar to symptoms of having too much alcohol.  If your health care provider has given you approval to drink alcohol, do so in moderation and use the following guidelines:  Women should not have more than one drink per day, and men should not have more than two drinks per day. One drink is equal to:  12 oz of beer.  5 oz of wine.  1 oz of hard liquor.  Do not drink on an empty stomach.  Keep yourself hydrated. Have water, diet soda, or unsweetened iced tea.  Regular soda, juice, and other mixers might contain a lot of carbohydrates and should be counted. WHAT FOODS ARE NOT RECOMMENDED? As you make food choices, it is important to remember that all foods are not the same. Some foods have fewer nutrients per serving than other  foods, even though they might have the same number of calories or carbohydrates. It is difficult to get your body what it needs when you eat foods with fewer nutrients. Examples of foods that you should avoid that are high in calories and carbohydrates but low in nutrients include:  Trans fats (most processed foods list trans fats on the Nutrition Facts label).  Regular soda.  Juice.  Candy.  Sweets, such as cake, pie, doughnuts, and cookies.  Fried foods. WHAT FOODS CAN I EAT? Have nutrient-rich foods, which will nourish your body and keep you healthy. The food you should eat also will depend on several factors, including:  The calories you need.  The medicines you take.  Your weight.  Your blood glucose level.  Your blood pressure level.  Your cholesterol level. You also should eat a variety of foods, including:  Protein, such as meat, poultry, fish, tofu, nuts, and seeds (lean animal proteins are best).  Fruits.  Vegetables.  Dairy products, such as milk, cheese, and yogurt (low fat is best).  Breads, grains, pasta, cereal, rice, and beans.  Fats such as olive oil, trans fat-free margarine, canola oil, avocado, and olives. DOES EVERYONE WITH DIABETES MELLITUS HAVE THE SAME MEAL PLAN? Because every person with diabetes mellitus is different, there is not one meal plan that works for everyone. It is very important that you meet with a dietitian who will help you create a meal plan that is just right for you. Document Released: 11/14/2004 Document Revised: 02/22/2013 Document Reviewed: 01/14/2013 ExitCare Patient Information 2015 [employer]. This   information is not intended to replace advice given to you by your health care provider. Make sure you discuss any questions you have with your health care provider. DASH Eating Plan DASH stands for "Dietary Approaches to Stop Hypertension." The DASH eating plan is a healthy eating plan that has been shown to reduce high  blood pressure (hypertension). Additional health benefits may include reducing the risk of type 2 diabetes mellitus, heart disease, and stroke. The DASH eating plan may also help with weight loss. WHAT DO I NEED TO KNOW ABOUT THE DASH EATING PLAN? For the DASH eating plan, you will follow these general guidelines:  Choose foods with a percent daily value for sodium of less than 5% (as listed on the food label).  Use salt-free seasonings or herbs instead of table salt or sea salt.  Check with your health care provider or pharmacist before using salt substitutes.  Eat lower-sodium products, often labeled as "lower sodium" or "no salt added."  Eat fresh foods.  Eat more vegetables, fruits, and low-fat dairy products.  Choose whole grains. Look for the word "whole" as the first word in the ingredient list.  Choose fish and skinless chicken or turkey more often than red meat. Limit fish, poultry, and meat to 6 oz (170 g) each day.  Limit sweets, desserts, sugars, and sugary drinks.  Choose heart-healthy fats.  Limit cheese to 1 oz (28 g) per day.  Eat more home-cooked food and less restaurant, buffet, and fast food.  Limit fried foods.  Cook foods using methods other than frying.  Limit canned vegetables. If you do use them, rinse them well to decrease the sodium.  When eating at a restaurant, ask that your food be prepared with less salt, or no salt if possible. WHAT FOODS CAN I EAT? Seek help from a dietitian for individual calorie needs. Grains Whole grain or whole wheat bread. Brown rice. Whole grain or whole wheat pasta. Quinoa, bulgur, and whole grain cereals. Low-sodium cereals. Corn or whole wheat flour tortillas. Whole grain cornbread. Whole grain crackers. Low-sodium crackers. Vegetables Fresh or frozen vegetables (raw, steamed, roasted, or grilled). Low-sodium or reduced-sodium tomato and vegetable juices. Low-sodium or reduced-sodium tomato sauce and paste. Low-sodium  or reduced-sodium canned vegetables.  Fruits All fresh, canned (in natural juice), or frozen fruits. Meat and Other Protein Products Ground beef (85% or leaner), grass-fed beef, or beef trimmed of fat. Skinless chicken or turkey. Ground chicken or turkey. Pork trimmed of fat. All fish and seafood. Eggs. Dried beans, peas, or lentils. Unsalted nuts and seeds. Unsalted canned beans. Dairy Low-fat dairy products, such as skim or 1% milk, 2% or reduced-fat cheeses, low-fat ricotta or cottage cheese, or plain low-fat yogurt. Low-sodium or reduced-sodium cheeses. Fats and Oils Tub margarines without trans fats. Light or reduced-fat mayonnaise and salad dressings (reduced sodium). Avocado. Safflower, olive, or canola oils. Natural peanut or almond butter. Other Unsalted popcorn and pretzels. The items listed above may not be a complete list of recommended foods or beverages. Contact your dietitian for more options. WHAT FOODS ARE NOT RECOMMENDED? Grains White bread. White pasta. White rice. Refined cornbread. Bagels and croissants. Crackers that contain trans fat. Vegetables Creamed or fried vegetables. Vegetables in a cheese sauce. Regular canned vegetables. Regular canned tomato sauce and paste. Regular tomato and vegetable juices. Fruits Dried fruits. Canned fruit in light or heavy syrup. Fruit juice. Meat and Other Protein Products Fatty cuts of meat. Ribs, chicken wings, bacon, sausage, bologna, salami, chitterlings, fatback, hot   dogs, bratwurst, and packaged luncheon meats. Salted nuts and seeds. Canned beans with salt. Dairy Whole or 2% milk, cream, half-and-half, and cream cheese. Whole-fat or sweetened yogurt. Full-fat cheeses or blue cheese. Nondairy creamers and whipped toppings. Processed cheese, cheese spreads, or cheese curds. Condiments Onion and garlic salt, seasoned salt, table salt, and sea salt. Canned and packaged gravies. Worcestershire sauce. Tartar sauce. Barbecue sauce.  Teriyaki sauce. Soy sauce, including reduced sodium. Steak sauce. Fish sauce. Oyster sauce. Cocktail sauce. Horseradish. Ketchup and mustard. Meat flavorings and tenderizers. Bouillon cubes. Hot sauce. Tabasco sauce. Marinades. Taco seasonings. Relishes. Fats and Oils Butter, stick margarine, lard, shortening, ghee, and bacon fat. Coconut, palm kernel, or palm oils. Regular salad dressings. Other Pickles and olives. Salted popcorn and pretzels. The items listed above may not be a complete list of foods and beverages to avoid. Contact your dietitian for more information. WHERE CAN I FIND MORE INFORMATION? National Heart, Lung, and Blood Institute: www.nhlbi.nih.gov/health/health-topics/topics/dash/ Document Released: 02/06/2011 Document Revised: 07/04/2013 Document Reviewed: 12/22/2012 ExitCare Patient Information 2015 [employer]. This information is not intended to replace advice given to you by your health care provider. Make sure you discuss any questions you have with your health care provider.  

## 2014-02-20 ENCOUNTER — Telehealth: Payer: Self-pay | Admitting: Internal Medicine

## 2014-02-20 ENCOUNTER — Ambulatory Visit: Payer: BC Managed Care – PPO | Attending: Internal Medicine

## 2014-02-20 DIAGNOSIS — E139 Other specified diabetes mellitus without complications: Secondary | ICD-10-CM

## 2014-02-20 DIAGNOSIS — E785 Hyperlipidemia, unspecified: Secondary | ICD-10-CM

## 2014-02-20 DIAGNOSIS — Z139 Encounter for screening, unspecified: Secondary | ICD-10-CM

## 2014-02-20 LAB — CBC WITH DIFFERENTIAL/PLATELET
BASOS PCT: 1 % (ref 0–1)
Basophils Absolute: 0.1 10*3/uL (ref 0.0–0.1)
EOS ABS: 0.4 10*3/uL (ref 0.0–0.7)
EOS PCT: 6 % — AB (ref 0–5)
HCT: 41.1 % (ref 39.0–52.0)
HEMOGLOBIN: 13.1 g/dL (ref 13.0–17.0)
LYMPHS ABS: 2.2 10*3/uL (ref 0.7–4.0)
Lymphocytes Relative: 30 % (ref 12–46)
MCH: 30.1 pg (ref 26.0–34.0)
MCHC: 31.9 g/dL (ref 30.0–36.0)
MCV: 94.5 fL (ref 78.0–100.0)
MPV: 10.4 fL (ref 9.4–12.4)
Monocytes Absolute: 0.5 10*3/uL (ref 0.1–1.0)
Monocytes Relative: 7 % (ref 3–12)
NEUTROS PCT: 56 % (ref 43–77)
Neutro Abs: 4.1 10*3/uL (ref 1.7–7.7)
Platelets: 314 10*3/uL (ref 150–400)
RBC: 4.35 MIL/uL (ref 4.22–5.81)
RDW: 13.5 % (ref 11.5–15.5)
WBC: 7.3 10*3/uL (ref 4.0–10.5)

## 2014-02-20 LAB — COMPLETE METABOLIC PANEL WITH GFR
ALK PHOS: 67 U/L (ref 39–117)
ALT: 12 U/L (ref 0–53)
AST: 12 U/L (ref 0–37)
Albumin: 3.9 g/dL (ref 3.5–5.2)
BILIRUBIN TOTAL: 0.4 mg/dL (ref 0.2–1.2)
BUN: 18 mg/dL (ref 6–23)
CO2: 28 meq/L (ref 19–32)
CREATININE: 0.95 mg/dL (ref 0.50–1.35)
Calcium: 9.4 mg/dL (ref 8.4–10.5)
Chloride: 105 mEq/L (ref 96–112)
GLUCOSE: 133 mg/dL — AB (ref 70–99)
Potassium: 5.4 mEq/L — ABNORMAL HIGH (ref 3.5–5.3)
Sodium: 142 mEq/L (ref 135–145)
Total Protein: 6.6 g/dL (ref 6.0–8.3)

## 2014-02-20 LAB — LIPID PANEL
CHOL/HDL RATIO: 4.5 ratio
Cholesterol: 200 mg/dL (ref 0–200)
HDL: 44 mg/dL (ref >39)
LDL Cholesterol: 133 mg/dL — ABNORMAL HIGH (ref 0–99)
TRIGLYCERIDES: 116 mg/dL (ref <150)
VLDL: 23 mg/dL (ref 0–40)

## 2014-02-20 NOTE — Telephone Encounter (Signed)
Pt. Is requesting new prescriptions for his meds in order for him to get his medications at our pharmacy, pt. States that he would like to be enrolled in the [facility] program and would like to speak to PCP nurse.

## 2014-02-21 ENCOUNTER — Telehealth: Payer: Self-pay | Admitting: Emergency Medicine

## 2014-02-21 LAB — VITAMIN D 25 HYDROXY (VIT D DEFICIENCY, FRACTURES): Vit D, 25-Hydroxy: 15 ng/mL — ABNORMAL LOW (ref 30–100)

## 2014-02-21 LAB — TSH: TSH: 1.413 u[IU]/mL (ref 0.350–4.500)

## 2014-02-21 NOTE — Telephone Encounter (Signed)
Left message for pt to call regarding medication change

## 2014-02-22 ENCOUNTER — Telehealth: Payer: Self-pay

## 2014-02-22 NOTE — Telephone Encounter (Signed)
Patient called inquiring on how to transfer [facility] Explained to patient that our [facility] will take of the transfer [facility] [address] to here

## 2014-02-23 ENCOUNTER — Other Ambulatory Visit: Payer: Self-pay | Admitting: Emergency Medicine

## 2014-02-23 ENCOUNTER — Telehealth: Payer: Self-pay | Admitting: Emergency Medicine

## 2014-02-23 MED ORDER — GLUCOSE BLOOD VI STRP: ORAL_STRIP | Status: DC

## 2014-02-23 MED ORDER — INSULIN SYRINGES (DISPOSABLE) U-100 0.5 ML MISC: Status: DC

## 2014-02-23 MED ORDER — VITAMIN D (ERGOCALCIFEROL) 1.25 MG (50000 UNIT) PO CAPS: 50000.0000 [IU] | ORAL_CAPSULE | ORAL | Status: DC

## 2014-02-23 NOTE — Telephone Encounter (Signed)
-----   Message from Doris Cheadleeepak Advani, MD sent at 02/21/2014  2:19 PM EST ----- Blood work reviewed, noticed low vitamin D, call patient advise to start ergocalciferol 50,000 units once a week for the duration of  12 weeks. noticed borderline elevated potassium, advise patient to avoid high potassium containing foods.

## 2014-02-23 NOTE — Telephone Encounter (Signed)
Left message with lab results and to start taking prescribed medication Vitamin 50,000 units once a week x 12 weeks Medication e-scribed to [facility]

## 2014-03-13 ENCOUNTER — Other Ambulatory Visit: Payer: Self-pay

## 2014-03-13 ENCOUNTER — Ambulatory Visit: Payer: BLUE CROSS/BLUE SHIELD | Attending: Internal Medicine

## 2014-03-13 MED ORDER — INSULIN ASPART 100 UNIT/ML ~~LOC~~ SOLN: 10.0000 [IU] | Freq: Three times a day (TID) | SUBCUTANEOUS | Status: DC

## 2014-03-23 ENCOUNTER — Ambulatory Visit: Payer: BC Managed Care – PPO

## 2014-04-04 ENCOUNTER — Other Ambulatory Visit: Payer: Self-pay

## 2014-04-04 MED ORDER — METFORMIN HCL 1000 MG PO TABS: 1000.0000 mg | ORAL_TABLET | Freq: Two times a day (BID) | ORAL | Status: DC

## 2014-05-31 ENCOUNTER — Encounter: Payer: Self-pay | Admitting: Internal Medicine

## 2014-05-31 ENCOUNTER — Ambulatory Visit: Payer: BLUE CROSS/BLUE SHIELD | Attending: Internal Medicine | Admitting: Internal Medicine

## 2014-05-31 ENCOUNTER — Ambulatory Visit (HOSPITAL_COMMUNITY)
Admission: RE | Admit: 2014-05-31 | Discharge: 2014-05-31 | Disposition: A | Discharge status: Home / Self Care | Payer: No Typology Code available for payment source | Source: Ambulatory Visit | Attending: Internal Medicine | Admitting: Internal Medicine

## 2014-05-31 VITALS — BP 116/77 | HR 97 | Temp 98.4°F | Resp 16 | Wt 357.4 lb

## 2014-05-31 DIAGNOSIS — E559 Vitamin D deficiency, unspecified: Secondary | ICD-10-CM | POA: Diagnosis not present

## 2014-05-31 DIAGNOSIS — M79671 Pain in right foot: Secondary | ICD-10-CM | POA: Insufficient documentation

## 2014-05-31 DIAGNOSIS — E119 Type 2 diabetes mellitus without complications: Secondary | ICD-10-CM | POA: Insufficient documentation

## 2014-05-31 DIAGNOSIS — E139 Other specified diabetes mellitus without complications: Secondary | ICD-10-CM

## 2014-05-31 DIAGNOSIS — M79674 Pain in right toe(s): Secondary | ICD-10-CM

## 2014-05-31 DIAGNOSIS — E785 Hyperlipidemia, unspecified: Secondary | ICD-10-CM | POA: Insufficient documentation

## 2014-05-31 DIAGNOSIS — I10 Essential (primary) hypertension: Secondary | ICD-10-CM

## 2014-05-31 DIAGNOSIS — M7989 Other specified soft tissue disorders: Secondary | ICD-10-CM | POA: Insufficient documentation

## 2014-05-31 LAB — GLUCOSE, POCT (MANUAL RESULT ENTRY): POC GLUCOSE: 203 mg/dL — AB (ref 70–99)

## 2014-05-31 LAB — COMPLETE METABOLIC PANEL WITH GFR
ALT: 10 U/L (ref 0–53)
AST: 12 U/L (ref 0–37)
Albumin: 4.2 g/dL (ref 3.5–5.2)
Alkaline Phosphatase: 80 U/L (ref 39–117)
BUN: 17 mg/dL (ref 6–23)
CO2: 30 meq/L (ref 19–32)
Calcium: 9.9 mg/dL (ref 8.4–10.5)
Chloride: 104 mEq/L (ref 96–112)
Creat: 1.04 mg/dL (ref 0.50–1.35)
GFR, Est African American: >89 mL/min
GFR, Est Non African American: 83 mL/min
Glucose, Bld: 206 mg/dL — ABNORMAL HIGH (ref 70–99)
Potassium: 5.3 mEq/L (ref 3.5–5.3)
SODIUM: 144 meq/L (ref 135–145)
TOTAL PROTEIN: 7.5 g/dL (ref 6.0–8.3)
Total Bilirubin: 0.3 mg/dL (ref 0.2–1.2)

## 2014-05-31 LAB — URIC ACID: URIC ACID, SERUM: 8.5 mg/dL — AB (ref 4.0–7.8)

## 2014-05-31 LAB — POCT GLYCOSYLATED HEMOGLOBIN (HGB A1C): HEMOGLOBIN A1C: 6.5

## 2014-05-31 MED ORDER — IBUPROFEN 600 MG PO TABS: 600.0000 mg | ORAL_TABLET | Freq: Three times a day (TID) | ORAL | Status: DC | PRN

## 2014-05-31 NOTE — Progress Notes (Signed)
MRN: [id number] Name: Dillon Mcgee  Sex: male Age: 54 y.o. DOB: 1961/01/26  Allergies: Review of patient's allergies indicates no known allergies.  Chief Complaint  Patient presents with  . Follow-up    HPI: Patient is 54 y.o. male who history of hypertension, diabetes, hyperlipidemia comes today for followup , he has been compliant with his medications his blood pressure is controlled, his hemoglobin A1c is 6.5%, he denies any hypoglycemic symptoms, today's major concern is right foot big toe swelling for the last 4-5 days, patient not sure if he bumped it, has taken ibuprofen which helped him with the symptoms denies any fever chills. Patient does report family history of gout, denies drinking alcohol.  Past Medical History  Diagnosis Date  . Diabetes mellitus without complication   . Hyperlipidemia     Past Surgical History  Procedure Laterality Date  . Colonoscopy      5 years ago       Medication List       This list is accurate as of: 05/31/14  3:39 PM.  Always use your most recent med list.               aspirin EC 81 MG tablet  Take 81 mg by mouth daily.     ferrous sulfate 325 (65 FE) MG tablet  Take 325 mg by mouth daily with breakfast.     gabapentin 300 MG capsule  Commonly known as:  NEURONTIN  Take 300 mg by mouth 3 (three) times daily.     glucose blood test strip  Use as instructed     hydrochlorothiazide 12.5 MG tablet  Commonly known as:  HYDRODIURIL  Take 12.5 mg by mouth daily.     ibuprofen 600 MG tablet  Commonly known as:  ADVIL,MOTRIN  Take 1 tablet (600 mg total) by mouth every 8 (eight) hours as needed.     insulin aspart 100 UNIT/ML injection  Commonly known as:  novoLOG  Inject 10 Units into the skin 3 (three) times daily before meals.     Insulin Syringes (Disposable) U-100 0.5 ML Misc  Used to inject into the skin as prescribed by physician     metFORMIN 1000 MG tablet  Commonly known as:  GLUCOPHAGE  Take 1 tablet  (1,000 mg total) by mouth 2 (two) times daily with a meal.     pioglitazone 15 MG tablet  Commonly known as:  ACTOS  Take 15 mg by mouth daily.     pravastatin 20 MG tablet  Commonly known as:  PRAVACHOL  Take 20 mg by mouth daily.     Vitamin D (Ergocalciferol) 50000 UNITS Caps capsule  Commonly known as:  DRISDOL  Take 1 capsule (50,000 Units total) by mouth every 7 (seven) days.        Meds ordered this encounter  Medications  . ibuprofen (ADVIL,MOTRIN) 600 MG tablet    Sig: Take 1 tablet (600 mg total) by mouth every 8 (eight) hours as needed.    Dispense:  30 tablet    Refill:  1     There is no immunization history on file for this patient.  Family History  Problem Relation Age of Onset  . Heart disease Maternal Aunt   . Diabetes Maternal Uncle     History  Substance Use Topics  . Smoking status: Never Smoker   . Smokeless tobacco: Not on file  . Alcohol Use: No    Review of Systems   As  noted in HPI  Filed Vitals:   05/31/14 1408  BP: 116/77  Pulse: 97  Temp: 98.4 F (36.9 C)  Resp: 16    Physical Exam  Physical Exam  Constitutional: No distress.  Eyes: EOM are normal. Pupils are equal, round, and reactive to light.  Neck: Neck supple.  Cardiovascular: Normal rate and regular rhythm.   Pulmonary/Chest: Breath sounds normal. No respiratory distress. He has no wheezes. He has no rales.  Musculoskeletal:   Right foot big toe erythema and swelling, minimal warmth and tenderness, no skin breakdown, 2+ dorsalis pedis pulse.    CBC    Component Value Date/Time   WBC 7.3 02/20/2014 0903   RBC 4.35 02/20/2014 0903   HGB 13.1 02/20/2014 0903   HCT 41.1 02/20/2014 0903   PLT 314 02/20/2014 0903   MCV 94.5 02/20/2014 0903   LYMPHSABS 2.2 02/20/2014 0903   MONOABS 0.5 02/20/2014 0903   EOSABS 0.4 02/20/2014 0903   BASOSABS 0.1 02/20/2014 0903    CMP     Component Value Date/Time   NA 142 02/20/2014 0903   K 5.4* 02/20/2014 0903   CL 105  02/20/2014 0903   CO2 28 02/20/2014 0903   GLUCOSE 133* 02/20/2014 0903   BUN 18 02/20/2014 0903   CREATININE 0.95 02/20/2014 0903   CREATININE 0.96 09/11/2010 0512   CALCIUM 9.4 02/20/2014 0903   PROT 6.6 02/20/2014 0903   ALBUMIN 3.9 02/20/2014 0903   AST 12 02/20/2014 0903   ALT 12 02/20/2014 0903   ALKPHOS 67 02/20/2014 0903   BILITOT 0.4 02/20/2014 0903   GFRNONAA >89 02/20/2014 0903   GFRNONAA >60 09/11/2010 0512   GFRAA >89 02/20/2014 0903   GFRAA >60 09/11/2010 0512    Lab Results  Component Value Date/Time   CHOL 200 02/20/2014 09:03 AM    No components found for: HGA1C  Lab Results  Component Value Date/Time   AST 12 02/20/2014 09:03 AM    Assessment and Plan  Other specified diabetes mellitus without complications - Plan: Results for orders placed or performed in visit on 05/31/14  Glucose (CBG)  Result Value Ref Range   POC Glucose 203.0 (A) 70 - 99 mg/dl  HgB [id number]  Result Value Ref Range   Hemoglobin A1C 6.50    Diabetes is well controlled,patient to continue with NovoLog, metformin, Actos, repeat A1c in 3 months.  Essential hypertension - Plan: blood pressure is well controlled continue current meds repeat blood chemistry COMPLETE METABOLIC PANEL WITH GFR  Hyperlipidemia Currently patient is on Pravachol 20 mg, will check fasting lipid panel on the next visit.  Vitamin D deficiency Patient will start taking over-the-counter vitamin D 2000 units daily  Pain of toe of right foot - Plan: Uric acid, DG Toe Great Right, ibuprofen (ADVIL,MOTRIN) 600 MG tablet    Return in about 3 months (around 08/31/2014) for diabetes, hypertension.   This note has been created with Education officer, environmentalDragon speech recognition software and smart phrase technology. Any transcriptional errors are unintentional.    Doris CheadleADVANI, Cordella Nyquist, MD

## 2014-05-31 NOTE — Patient Instructions (Signed)
Fat and Cholesterol Control Diet Fat and cholesterol levels in your blood and organs are influenced by your diet. High levels of fat and cholesterol may lead to diseases of the heart, small and large blood vessels, gallbladder, liver, and pancreas. CONTROLLING FAT AND CHOLESTEROL WITH DIET Although exercise and lifestyle factors are important, your diet is key. That is because certain foods are known to raise cholesterol and others to lower it. The goal is to balance foods for their effect on cholesterol and more importantly, to replace saturated and trans fat with other types of fat, such as monounsaturated fat, polyunsaturated fat, and omega-3 fatty acids. On average, a person should consume no more than 15 to 17 g of saturated fat daily. Saturated and trans fats are considered "bad" fats, and they will raise LDL cholesterol. Saturated fats are primarily found in animal products such as meats, butter, and cream. However, that does not mean you need to give up all your favorite foods. Today, there are good tasting, low-fat, low-cholesterol substitutes for most of the things you like to eat. Choose low-fat or nonfat alternatives. Choose round or loin cuts of red meat. These types of cuts are lowest in fat and cholesterol. Chicken (without the skin), fish, veal, and ground turkey breast are great choices. Eliminate fatty meats, such as hot dogs and salami. Even shellfish have little or no saturated fat. Have a 3 oz (85 g) portion when you eat lean meat, poultry, or fish. Trans fats are also called "partially hydrogenated oils." They are oils that have been scientifically manipulated so that they are solid at room temperature resulting in a longer shelf life and improved taste and texture of foods in which they are added. Trans fats are found in stick margarine, some tub margarines, cookies, crackers, and baked goods.  When baking and cooking, oils are a great substitute for butter. The monounsaturated oils are  especially beneficial since it is believed they lower LDL and raise HDL. The oils you should avoid entirely are saturated tropical oils, such as coconut and palm.  Remember to eat a lot from food groups that are naturally free of saturated and trans fat, including fish, fruit, vegetables, beans, grains (barley, rice, couscous, bulgur wheat), and pasta (without cream sauces).  IDENTIFYING FOODS THAT LOWER FAT AND CHOLESTEROL  Soluble fiber may lower your cholesterol. This type of fiber is found in fruits such as apples, vegetables such as broccoli, potatoes, and carrots, legumes such as beans, peas, and lentils, and grains such as barley. Foods fortified with plant sterols (phytosterol) may also lower cholesterol. You should eat at least 2 g per day of these foods for a cholesterol lowering effect.  Read package labels to identify low-saturated fats, trans fat free, and low-fat foods at the supermarket. Select cheeses that have only 2 to 3 g saturated fat per ounce. Use a heart-healthy tub margarine that is free of trans fats or partially hydrogenated oil. When buying baked goods (cookies, crackers), avoid partially hydrogenated oils. Breads and muffins should be made from whole grains (whole-wheat or whole oat flour, instead of "flour" or "enriched flour"). Buy non-creamy canned soups with reduced salt and no added fats.  FOOD PREPARATION TECHNIQUES  Never deep-fry. If you must fry, either stir-fry, which uses very little fat, or use non-stick cooking sprays. When possible, broil, bake, or roast meats, and steam vegetables. Instead of putting butter or margarine on vegetables, use lemon and herbs, applesauce, and cinnamon (for squash and sweet potatoes). Use nonfat   yogurt, salsa, and low-fat dressings for salads.  LOW-SATURATED FAT / LOW-FAT FOOD SUBSTITUTES Meats / Saturated Fat (g)  Avoid: Steak, marbled (3 oz/85 g) / 11 g  Choose: Steak, lean (3 oz/85 g) / 4 g  Avoid: Hamburger (3 oz/85 g) / 7  g  Choose: Hamburger, lean (3 oz/85 g) / 5 g  Avoid: Ham (3 oz/85 g) / 6 g  Choose: Ham, lean cut (3 oz/85 g) / 2.4 g  Avoid: Chicken, with skin, dark meat (3 oz/85 g) / 4 g  Choose: Chicken, skin removed, dark meat (3 oz/85 g) / 2 g  Avoid: Chicken, with skin, light meat (3 oz/85 g) / 2.5 g  Choose: Chicken, skin removed, light meat (3 oz/85 g) / 1 g Dairy / Saturated Fat (g)  Avoid: Whole milk (1 cup) / 5 g  Choose: Low-fat milk, 2% (1 cup) / 3 g  Choose: Low-fat milk, 1% (1 cup) / 1.5 g  Choose: Skim milk (1 cup) / 0.3 g  Avoid: Hard cheese (1 oz/28 g) / 6 g  Choose: Skim milk cheese (1 oz/28 g) / 2 to 3 g  Avoid: Cottage cheese, 4% fat (1 cup) / 6.5 g  Choose: Low-fat cottage cheese, 1% fat (1 cup) / 1.5 g  Avoid: Ice cream (1 cup) / 9 g  Choose: Sherbet (1 cup) / 2.5 g  Choose: Nonfat frozen yogurt (1 cup) / 0.3 g  Choose: Frozen fruit bar / trace  Avoid: Whipped cream (1 tbs) / 3.5 g  Choose: Nondairy whipped topping (1 tbs) / 1 g Condiments / Saturated Fat (g)  Avoid: Mayonnaise (1 tbs) / 2 g  Choose: Low-fat mayonnaise (1 tbs) / 1 g  Avoid: Butter (1 tbs) / 7 g  Choose: Extra light margarine (1 tbs) / 1 g  Avoid: Coconut oil (1 tbs) / 11.8 g  Choose: Olive oil (1 tbs) / 1.8 g  Choose: Corn oil (1 tbs) / 1.7 g  Choose: Safflower oil (1 tbs) / 1.2 g  Choose: Sunflower oil (1 tbs) / 1.4 g  Choose: Soybean oil (1 tbs) / 2.4 g  Choose: Canola oil (1 tbs) / 1 g Document Released: 02/17/2005 Document Revised: 06/14/2012 Document Reviewed: 05/18/2013 Exit[employer] Patient Information 2015 [employer]. This information is not intended to replace advice given to you by your health care provider. Make sure you discuss any questions you have with your health care provider. Diabetes Mellitus and Food It is important for you to manage your blood sugar (glucose) level. Your blood glucose level can be greatly affected by what you eat. Eating healthier  foods in the appropriate amounts throughout the day at about the same time each day will help you control your blood glucose level. It can also help slow or prevent worsening of your diabetes mellitus. Healthy eating may even help you improve the level of your blood pressure and reach or maintain a healthy weight.  HOW CAN FOOD AFFECT ME? Carbohydrates Carbohydrates affect your blood glucose level more than any other type of food. Your dietitian will help you determine how many carbohydrates to eat at each meal and teach you how to count carbohydrates. Counting carbohydrates is important to keep your blood glucose at a healthy level, especially if you are using insulin or taking certain medicines for diabetes mellitus. Alcohol Alcohol can cause sudden decreases in blood glucose (hypoglycemia), especially if you use insulin or take certain medicines for diabetes mellitus. Hypoglycemia can be a life-threatening   condition. Symptoms of hypoglycemia (sleepiness, dizziness, and disorientation) are similar to symptoms of having too much alcohol.  If your health care provider has given you approval to drink alcohol, do so in moderation and use the following guidelines:  Women should not have more than one drink per day, and men should not have more than two drinks per day. One drink is equal to:  12 oz of beer.  5 oz of wine.  1 oz of hard liquor.  Do not drink on an empty stomach.  Keep yourself hydrated. Have water, diet soda, or unsweetened iced tea.  Regular soda, juice, and other mixers might contain a lot of carbohydrates and should be counted. WHAT FOODS ARE NOT RECOMMENDED? As you make food choices, it is important to remember that all foods are not the same. Some foods have fewer nutrients per serving than other foods, even though they might have the same number of calories or carbohydrates. It is difficult to get your body what it needs when you eat foods with fewer nutrients. Examples of  foods that you should avoid that are high in calories and carbohydrates but low in nutrients include:  Trans fats (most processed foods list trans fats on the Nutrition Facts label).  Regular soda.  Juice.  Candy.  Sweets, such as cake, pie, doughnuts, and cookies.  Fried foods. WHAT FOODS CAN I EAT? Have nutrient-rich foods, which will nourish your body and keep you healthy. The food you should eat also will depend on several factors, including:  The calories you need.  The medicines you take.  Your weight.  Your blood glucose level.  Your blood pressure level.  Your cholesterol level. You also should eat a variety of foods, including:  Protein, such as meat, poultry, fish, tofu, nuts, and seeds (lean animal proteins are best).  Fruits.  Vegetables.  Dairy products, such as milk, cheese, and yogurt (low fat is best).  Breads, grains, pasta, cereal, rice, and beans.  Fats such as olive oil, trans fat-free margarine, canola oil, avocado, and olives. DOES EVERYONE WITH DIABETES MELLITUS HAVE THE SAME MEAL PLAN? Because every person with diabetes mellitus is different, there is not one meal plan that works for everyone. It is very important that you meet with a dietitian who will help you create a meal plan that is just right for you. Document Released: 11/14/2004 Document Revised: 02/22/2013 Document Reviewed: 01/14/2013 ExitCare Patient Information 2015 [employer]. This information is not intended to replace advice given to you by your health care provider. Make sure you discuss any questions you have with your health care provider. DASH Eating Plan DASH stands for "Dietary Approaches to Stop Hypertension." The DASH eating plan is a healthy eating plan that has been shown to reduce high blood pressure (hypertension). Additional health benefits may include reducing the risk of type 2 diabetes mellitus, heart disease, and stroke. The DASH eating plan may also help with  weight loss. WHAT DO I NEED TO KNOW ABOUT THE DASH EATING PLAN? For the DASH eating plan, you will follow these general guidelines:  Choose foods with a percent daily value for sodium of less than 5% (as listed on the food label).  Use salt-free seasonings or herbs instead of table salt or sea salt.  Check with your health care provider or pharmacist before using salt substitutes.  Eat lower-sodium products, often labeled as "lower sodium" or "no salt added."  Eat fresh foods.  Eat more vegetables, fruits, and low-fat dairy   products.  Choose whole grains. Look for the word "whole" as the first word in the ingredient list.  Choose fish and skinless chicken or turkey more often than red meat. Limit fish, poultry, and meat to 6 oz (170 g) each day.  Limit sweets, desserts, sugars, and sugary drinks.  Choose heart-healthy fats.  Limit cheese to 1 oz (28 g) per day.  Eat more home-cooked food and less restaurant, buffet, and fast food.  Limit fried foods.  Cook foods using methods other than frying.  Limit canned vegetables. If you do use them, rinse them well to decrease the sodium.  When eating at a restaurant, ask that your food be prepared with less salt, or no salt if possible. WHAT FOODS CAN I EAT? Seek help from a dietitian for individual calorie needs. Grains Whole grain or whole wheat bread. Brown rice. Whole grain or whole wheat pasta. Quinoa, bulgur, and whole grain cereals. Low-sodium cereals. Corn or whole wheat flour tortillas. Whole grain cornbread. Whole grain crackers. Low-sodium crackers. Vegetables Fresh or frozen vegetables (raw, steamed, roasted, or grilled). Low-sodium or reduced-sodium tomato and vegetable juices. Low-sodium or reduced-sodium tomato sauce and paste. Low-sodium or reduced-sodium canned vegetables.  Fruits All fresh, canned (in natural juice), or frozen fruits. Meat and Other Protein Products Ground beef (85% or leaner), grass-fed beef, or  beef trimmed of fat. Skinless chicken or turkey. Ground chicken or turkey. Pork trimmed of fat. All fish and seafood. Eggs. Dried beans, peas, or lentils. Unsalted nuts and seeds. Unsalted canned beans. Dairy Low-fat dairy products, such as skim or 1% milk, 2% or reduced-fat cheeses, low-fat ricotta or cottage cheese, or plain low-fat yogurt. Low-sodium or reduced-sodium cheeses. Fats and Oils Tub margarines without trans fats. Light or reduced-fat mayonnaise and salad dressings (reduced sodium). Avocado. Safflower, olive, or canola oils. Natural peanut or almond butter. Other Unsalted popcorn and pretzels. The items listed above may not be a complete list of recommended foods or beverages. Contact your dietitian for more options. WHAT FOODS ARE NOT RECOMMENDED? Grains White bread. White pasta. White rice. Refined cornbread. Bagels and croissants. Crackers that contain trans fat. Vegetables Creamed or fried vegetables. Vegetables in a cheese sauce. Regular canned vegetables. Regular canned tomato sauce and paste. Regular tomato and vegetable juices. Fruits Dried fruits. Canned fruit in light or heavy syrup. Fruit juice. Meat and Other Protein Products Fatty cuts of meat. Ribs, chicken wings, bacon, sausage, bologna, salami, chitterlings, fatback, hot dogs, bratwurst, and packaged luncheon meats. Salted nuts and seeds. Canned beans with salt. Dairy Whole or 2% milk, cream, half-and-half, and cream cheese. Whole-fat or sweetened yogurt. Full-fat cheeses or blue cheese. Nondairy creamers and whipped toppings. Processed cheese, cheese spreads, or cheese curds. Condiments Onion and garlic salt, seasoned salt, table salt, and sea salt. Canned and packaged gravies. Worcestershire sauce. Tartar sauce. Barbecue sauce. Teriyaki sauce. Soy sauce, including reduced sodium. Steak sauce. Fish sauce. Oyster sauce. Cocktail sauce. Horseradish. Ketchup and mustard. Meat flavorings and tenderizers. Bouillon cubes.  Hot sauce. Tabasco sauce. Marinades. Taco seasonings. Relishes. Fats and Oils Butter, stick margarine, lard, shortening, ghee, and bacon fat. Coconut, palm kernel, or palm oils. Regular salad dressings. Other Pickles and olives. Salted popcorn and pretzels. The items listed above may not be a complete list of foods and beverages to avoid. Contact your dietitian for more information. WHERE CAN I FIND MORE INFORMATION? National Heart, [employer], and [employer] Institute: www.nhlbi.nih.gov/health/health-topics/topics/dash/ Document Released: 02/06/2011 Document Revised: 07/04/2013 Document Reviewed: 12/22/2012 Exit[employer] Patient Information 2015 [employer]. This information is not intended to replace advice given to you by your health care provider. Make sure you discuss any questions you have with your health care provider.  

## 2014-05-31 NOTE — Progress Notes (Signed)
Patient states was out walking last weekend Not sure if he injured his foot but the right foot was swollen and  Now has a red area on his right great toe that is red and swollen

## 2014-06-01 ENCOUNTER — Telehealth: Payer: Self-pay

## 2014-06-01 NOTE — Telephone Encounter (Signed)
-----   Message from Doris Cheadleeepak Advani, MD sent at 05/31/2014  5:01 PM EDT ----- Call and let the patient know that her as x-ray reported  IMPRESSION: No fracture or dislocation. No erosive change or bony destruction. No radiopaque foreign body.

## 2014-06-01 NOTE — Telephone Encounter (Signed)
Patient is aware of his x ray results 

## 2014-06-05 ENCOUNTER — Telehealth: Payer: Self-pay | Admitting: Internal Medicine

## 2014-06-05 NOTE — Telephone Encounter (Signed)
Patient has walked in the clinic today to request a medication refill for Vitamin D, Ergocalciferol, (DRISDOL) 50000 UNITS CAPS capsule; patient is not going to wait but did request that all his medication be switched over to [facility]

## 2014-06-07 ENCOUNTER — Telehealth: Payer: Self-pay

## 2014-06-07 NOTE — Telephone Encounter (Signed)
-----   Message from Doris Cheadleeepak Advani, MD sent at 06/01/2014 12:11 PM EDT ----- Call and let the patient know that his uric acid is borderline elevated, advise patient to avoid alcohol and avoid high-protein diet.

## 2014-06-07 NOTE — Telephone Encounter (Signed)
Patient is aware of his lab results 

## 2014-07-11 ENCOUNTER — Other Ambulatory Visit: Payer: Self-pay | Admitting: Internal Medicine

## 2014-07-12 ENCOUNTER — Other Ambulatory Visit: Payer: Self-pay | Admitting: Internal Medicine

## 2014-07-13 ENCOUNTER — Other Ambulatory Visit: Payer: Self-pay

## 2014-07-13 DIAGNOSIS — E119 Type 2 diabetes mellitus without complications: Secondary | ICD-10-CM

## 2014-07-13 MED ORDER — METFORMIN HCL 1000 MG PO TABS: 1000.0000 mg | ORAL_TABLET | Freq: Two times a day (BID) | ORAL | Status: DC

## 2014-07-14 ENCOUNTER — Other Ambulatory Visit: Payer: Self-pay | Admitting: Internal Medicine

## 2014-08-31 ENCOUNTER — Encounter (HOSPITAL_COMMUNITY): Payer: Self-pay | Admitting: Emergency Medicine

## 2014-08-31 ENCOUNTER — Emergency Department (INDEPENDENT_AMBULATORY_CARE_PROVIDER_SITE_OTHER)
Admission: EM | Admit: 2014-08-31 | Discharge: 2014-08-31 | Disposition: A | Discharge status: Home / Self Care | Payer: Self-pay | Source: Home / Self Care | Attending: Family Medicine | Admitting: Family Medicine

## 2014-08-31 DIAGNOSIS — G5711 Meralgia paresthetica, right lower limb: Secondary | ICD-10-CM

## 2014-08-31 DIAGNOSIS — M5431 Sciatica, right side: Secondary | ICD-10-CM

## 2014-08-31 MED ORDER — METHYLPREDNISOLONE ACETATE 80 MG/ML IJ SUSP
80.0000 mg | Freq: Once | INTRAMUSCULAR | Status: AC
  Administered 2014-08-31: 80 mg via INTRAMUSCULAR

## 2014-08-31 MED ORDER — METHYLPREDNISOLONE ACETATE 80 MG/ML IJ SUSP
INTRAMUSCULAR | Status: AC
  Filled 2014-08-31: qty 1

## 2014-08-31 NOTE — ED Notes (Signed)
C/o back pain States six years ago he fell inside of shower States pain has been for three weeks Does get cortisone shots  Last shot was sept.  Of last year

## 2014-08-31 NOTE — Discharge Instructions (Signed)
Thank you for coming in today. Follow-up with sports medicine. Consider losing weight. Keep track of your blood sugar as the cortisone shot will increase your blood sugar. Come back or go to the emergency room if you notice new weakness new numbness problems walking or bowel or bladder problems.  Sciatica with Rehab The sciatic nerve runs from the back down the leg and is responsible for sensation and control of the muscles in the back (posterior) side of the thigh, lower leg, and foot. Sciatica is a condition that is characterized by inflammation of this nerve.  SYMPTOMS   Signs of nerve damage, including numbness and/or weakness along the posterior side of the lower extremity.  Pain in the back of the thigh that may also travel down the leg.  Pain that worsens when sitting for long periods of time.  Occasionally, pain in the back or buttock. CAUSES  Inflammation of the sciatic nerve is the cause of sciatica. The inflammation is due to something irritating the nerve. Common sources of irritation include:  Sitting for long periods of time.  Direct trauma to the nerve.  Arthritis of the spine.  Herniated or ruptured disk.  Slipping of the vertebrae (spondylolisthesis).  Pressure from soft tissues, such as muscles or ligament-like tissue (fascia). RISK INCREASES WITH:  Sports that place pressure or stress on the spine (football or weightlifting).  Poor strength and flexibility.  Failure to warm up properly before activity.  Family history of low back pain or disk disorders.  Previous back injury or surgery.  Poor body mechanics, especially when lifting, or poor posture. PREVENTION   Warm up and stretch properly before activity.  Maintain physical fitness:  Strength, flexibility, and endurance.  Cardiovascular fitness.  Learn and use proper technique, especially with posture and lifting. When possible, have coach correct improper technique.  Avoid activities that  place stress on the spine. PROGNOSIS If treated properly, then sciatica usually resolves within 6 weeks. However, occasionally surgery is necessary.  RELATED COMPLICATIONS   Permanent nerve damage, including pain, numbness, tingle, or weakness.  Chronic back pain.  Risks of surgery: infection, bleeding, nerve damage, or damage to surrounding tissues. TREATMENT Treatment initially involves resting from any activities that aggravate your symptoms. The use of ice and medication may help reduce pain and inflammation. The use of strengthening and stretching exercises may help reduce pain with activity. These exercises may be performed at home or with referral to a therapist. A therapist may recommend further treatments, such as transcutaneous electronic nerve stimulation (TENS) or ultrasound. Your caregiver may recommend corticosteroid injections to help reduce inflammation of the sciatic nerve. If symptoms persist despite non-surgical (conservative) treatment, then surgery may be recommended. MEDICATION  If pain medication is necessary, then nonsteroidal anti-inflammatory medications, such as aspirin and ibuprofen, or other minor pain relievers, such as acetaminophen, are often recommended.  Do not take pain medication for 7 days before surgery.  Prescription pain relievers may be given if deemed necessary by your caregiver. Use only as directed and only as much as you need.  Ointments applied to the skin may be helpful.  Corticosteroid injections may be given by your caregiver. These injections should be reserved for the most serious cases, because they may only be given a certain number of times. HEAT AND COLD  Cold treatment (icing) relieves pain and reduces inflammation. Cold treatment should be applied for 10 to 15 minutes every 2 to 3 hours for inflammation and pain and immediately after any activity that  aggravates your symptoms. Use ice packs or massage the area with a piece of ice (ice  massage).  Heat treatment may be used prior to performing the stretching and strengthening activities prescribed by your caregiver, physical therapist, or athletic trainer. Use a heat pack or soak the injury in warm water. SEEK MEDICAL CARE IF:  Treatment seems to offer no benefit, or the condition worsens.  Any medications produce adverse side effects. EXERCISES  RANGE OF MOTION (ROM) AND STRETCHING EXERCISES - Sciatica Most people with sciatic will find that their symptoms worsen with either excessive bending forward (flexion) or arching at the low back (extension). The exercises which will help resolve your symptoms will focus on the opposite motion. Your physician, physical therapist or athletic trainer will help you determine which exercises will be most helpful to resolve your low back pain. Do not complete any exercises without first consulting with your clinician. Discontinue any exercises which worsen your symptoms until you speak to your clinician. If you have pain, numbness or tingling which travels down into your buttocks, leg or foot, the goal of the therapy is for these symptoms to move closer to your back and eventually resolve. Occasionally, these leg symptoms will get better, but your low back pain may worsen; this is typically an indication of progress in your rehabilitation. Be certain to be very alert to any changes in your symptoms and the activities in which you participated in the 24 hours prior to the change. Sharing this information with your clinician will allow him/her to most efficiently treat your condition. These exercises may help you when beginning to rehabilitate your injury. Your symptoms may resolve with or without further involvement from your physician, physical therapist or athletic trainer. While completing these exercises, remember:   Restoring tissue flexibility helps normal motion to return to the joints. This allows healthier, less painful movement and  activity.  An effective stretch should be held for at least 30 seconds.  A stretch should never be painful. You should only feel a gentle lengthening or release in the stretched tissue. FLEXION RANGE OF MOTION AND STRETCHING EXERCISES: STRETCH - Flexion, Single Knee to Chest   Lie on a firm bed or floor with both legs extended in front of you.  Keeping one leg in contact with the floor, bring your opposite knee to your chest. Hold your leg in place by either grabbing behind your thigh or at your knee.  Pull until you feel a gentle stretch in your low back. Hold __________ seconds.  Slowly release your grasp and repeat the exercise with the opposite side. Repeat __________ times. Complete this exercise __________ times per day.  STRETCH - Flexion, Double Knee to Chest  Lie on a firm bed or floor with both legs extended in front of you.  Keeping one leg in contact with the floor, bring your opposite knee to your chest.  Tense your stomach muscles to support your back and then lift your other knee to your chest. Hold your legs in place by either grabbing behind your thighs or at your knees.  Pull both knees toward your chest until you feel a gentle stretch in your low back. Hold __________ seconds.  Tense your stomach muscles and slowly return one leg at a time to the floor. Repeat __________ times. Complete this exercise __________ times per day.  STRETCH - Low Trunk Rotation   Lie on a firm bed or floor. Keeping your legs in front of you, bend your knees  so they are both pointed toward the ceiling and your feet are flat on the floor.  Extend your arms out to the side. This will stabilize your upper body by keeping your shoulders in contact with the floor.  Gently and slowly drop both knees together to one side until you feel a gentle stretch in your low back. Hold for __________ seconds.  Tense your stomach muscles to support your low back as you bring your knees back to the  starting position. Repeat the exercise to the other side. Repeat __________ times. Complete this exercise __________ times per day  EXTENSION RANGE OF MOTION AND FLEXIBILITY EXERCISES: STRETCH - Extension, Prone on Elbows  Lie on your stomach on the floor, a bed will be too soft. Place your palms about shoulder width apart and at the height of your head.  Place your elbows under your shoulders. If this is too painful, stack pillows under your chest.  Allow your body to relax so that your hips drop lower and make contact more completely with the floor.  Hold this position for __________ seconds.  Slowly return to lying flat on the floor. Repeat __________ times. Complete this exercise __________ times per day.  RANGE OF MOTION - Extension, Prone Press Ups  Lie on your stomach on the floor, a bed will be too soft. Place your palms about shoulder width apart and at the height of your head.  Keeping your back as relaxed as possible, slowly straighten your elbows while keeping your hips on the floor. You may adjust the placement of your hands to maximize your comfort. As you gain motion, your hands will come more underneath your shoulders.  Hold this position __________ seconds.  Slowly return to lying flat on the floor. Repeat __________ times. Complete this exercise __________ times per day.  STRENGTHENING EXERCISES - Sciatica  These exercises may help you when beginning to rehabilitate your injury. These exercises should be done near your "sweet spot." This is the neutral, low-back arch, somewhere between fully rounded and fully arched, that is your least painful position. When performed in this safe range of motion, these exercises can be used for people who have either a flexion or extension based injury. These exercises may resolve your symptoms with or without further involvement from your physician, physical therapist or athletic trainer. While completing these exercises, remember:    Muscles can gain both the endurance and the strength needed for everyday activities through controlled exercises.  Complete these exercises as instructed by your physician, physical therapist or athletic trainer. Progress with the resistance and repetition exercises only as your caregiver advises.  You may experience muscle soreness or fatigue, but the pain or discomfort you are trying to eliminate should never worsen during these exercises. If this pain does worsen, stop and make certain you are following the directions exactly. If the pain is still present after adjustments, discontinue the exercise until you can discuss the trouble with your clinician. STRENGTHENING - Deep Abdominals, Pelvic Tilt   Lie on a firm bed or floor. Keeping your legs in front of you, bend your knees so they are both pointed toward the ceiling and your feet are flat on the floor.  Tense your lower abdominal muscles to press your low back into the floor. This motion will rotate your pelvis so that your tail bone is scooping upwards rather than pointing at your feet or into the floor.  With a gentle tension and even breathing, hold this position for  __________ seconds. Repeat __________ times. Complete this exercise __________ times per day.  STRENGTHENING - Abdominals, Crunches   Lie on a firm bed or floor. Keeping your legs in front of you, bend your knees so they are both pointed toward the ceiling and your feet are flat on the floor. Cross your arms over your chest.  Slightly tip your chin down without bending your neck.  Tense your abdominals and slowly lift your trunk high enough to just clear your shoulder blades. Lifting higher can put excessive stress on the low back and does not further strengthen your abdominal muscles.  Control your return to the starting position. Repeat __________ times. Complete this exercise __________ times per day.  STRENGTHENING - Quadruped, Opposite UE/LE Lift  Assume a  hands and knees position on a firm surface. Keep your hands under your shoulders and your knees under your hips. You may place padding under your knees for comfort.  Find your neutral spine and gently tense your abdominal muscles so that you can maintain this position. Your shoulders and hips should form a rectangle that is parallel with the floor and is not twisted.  Keeping your trunk steady, lift your right hand no higher than your shoulder and then your left leg no higher than your hip. Make sure you are not holding your breath. Hold this position __________ seconds.  Continuing to keep your abdominal muscles tense and your back steady, slowly return to your starting position. Repeat with the opposite arm and leg. Repeat __________ times. Complete this exercise __________ times per day.  STRENGTHENING - Abdominals and Quadriceps, Straight Leg Raise   Lie on a firm bed or floor with both legs extended in front of you.  Keeping one leg in contact with the floor, bend the other knee so that your foot can rest flat on the floor.  Find your neutral spine, and tense your abdominal muscles to maintain your spinal position throughout the exercise.  Slowly lift your straight leg off the floor about 6 inches for a count of 15, making sure to not hold your breath.  Still keeping your neutral spine, slowly lower your leg all the way to the floor. Repeat this exercise with each leg __________ times. Complete this exercise __________ times per day. POSTURE AND BODY MECHANICS CONSIDERATIONS - Sciatica Keeping correct posture when sitting, standing or completing your activities will reduce the stress put on different body tissues, allowing injured tissues a chance to heal and limiting painful experiences. The following are general guidelines for improved posture. Your physician or physical therapist will provide you with any instructions specific to your needs. While reading these guidelines,  remember:  The exercises prescribed by your provider will help you have the flexibility and strength to maintain correct postures.  The correct posture provides the optimal environment for your joints to work. All of your joints have less wear and tear when properly supported by a spine with good posture. This means you will experience a healthier, less painful body.  Correct posture must be practiced with all of your activities, especially prolonged sitting and standing. Correct posture is as important when doing repetitive low-stress activities (typing) as it is when doing a single heavy-load activity (lifting). RESTING POSITIONS Consider which positions are most painful for you when choosing a resting position. If you have pain with flexion-based activities (sitting, bending, stooping, squatting), choose a position that allows you to rest in a less flexed posture. You would want to avoid curling into  a fetal position on your side. If your pain worsens with extension-based activities (prolonged standing, working overhead), avoid resting in an extended position such as sleeping on your stomach. Most people will find more comfort when they rest with their spine in a more neutral position, neither too rounded nor too arched. Lying on a non-sagging bed on your side with a pillow between your knees, or on your back with a pillow under your knees will often provide some relief. Keep in mind, being in any one position for a prolonged period of time, no matter how correct your posture, can still lead to stiffness. PROPER SITTING POSTURE In order to minimize stress and discomfort on your spine, you must sit with correct posture Sitting with good posture should be effortless for a healthy body. Returning to good posture is a gradual process. Many people can work toward this most comfortably by using various supports until they have the flexibility and strength to maintain this posture on their own. When sitting  with proper posture, your ears will fall over your shoulders and your shoulders will fall over your hips. You should use the back of the chair to support your upper back. Your low back will be in a neutral position, just slightly arched. You may place a small pillow or folded towel at the base of your low back for support.  When working at a desk, create an environment that supports good, upright posture. Without extra support, muscles fatigue and lead to excessive strain on joints and other tissues. Keep these recommendations in mind: CHAIR:   A chair should be able to slide under your desk when your back makes contact with the back of the chair. This allows you to work closely.  The chair's height should allow your eyes to be level with the upper part of your monitor and your hands to be slightly lower than your elbows. BODY POSITION  Your feet should make contact with the floor. If this is not possible, use a foot rest.  Keep your ears over your shoulders. This will reduce stress on your neck and low back. INCORRECT SITTING POSTURES   If you are feeling tired and unable to assume a healthy sitting posture, do not slouch or slump. This puts excessive strain on your back tissues, causing more damage and pain. Healthier options include:  Using more support, like a lumbar pillow.  Switching tasks to something that requires you to be upright or walking.  Talking a brief walk.  Lying down to rest in a neutral-spine position. PROLONGED STANDING WHILE SLIGHTLY LEANING FORWARD  When completing a task that requires you to lean forward while standing in one place for a long time, place either foot up on a stationary 2-4 inch high object to help maintain the best posture. When both feet are on the ground, the low back tends to lose its slight inward curve. If this curve flattens (or becomes too large), then the back and your other joints will experience too much stress, fatigue more quickly and can  cause pain.  CORRECT STANDING POSTURES Proper standing posture should be assumed with all daily activities, even if they only take a few moments, like when brushing your teeth. As in sitting, your ears should fall over your shoulders and your shoulders should fall over your hips. You should keep a slight tension in your abdominal muscles to brace your spine. Your tailbone should point down to the ground, not behind your body, resulting in an over-extended  swayback posture.  INCORRECT STANDING POSTURES  Common incorrect standing postures include a forward head, locked knees and/or an excessive swayback. WALKING Walk with an upright posture. Your ears, shoulders and hips should all line-up. PROLONGED ACTIVITY IN A FLEXED POSITION When completing a task that requires you to bend forward at your waist or lean over a low surface, try to find a way to stabilize 3 of 4 of your limbs. You can place a hand or elbow on your thigh or rest a knee on the surface you are reaching across. This will provide you more stability so that your muscles do not fatigue as quickly. By keeping your knees relaxed, or slightly bent, you will also reduce stress across your low back. CORRECT LIFTING TECHNIQUES DO :   Assume a wide stance. This will provide you more stability and the opportunity to get as close as possible to the object which you are lifting.  Tense your abdominals to brace your spine; then bend at the knees and hips. Keeping your back locked in a neutral-spine position, lift using your leg muscles. Lift with your legs, keeping your back straight.  Test the weight of unknown objects before attempting to lift them.  Try to keep your elbows locked down at your sides in order get the best strength from your shoulders when carrying an object.  Always ask for help when lifting heavy or awkward objects. INCORRECT LIFTING TECHNIQUES DO NOT:   Lock your knees when lifting, even if it is a small object.  Bend and  twist. Pivot at your feet or move your feet when needing to change directions.  Assume that you cannot safely pick up a paperclip without proper posture. Document Released: 02/17/2005 Document Revised: 07/04/2013 Document Reviewed: 06/01/2008 [facility] Patient Information 2015 [place], [place]. This information is not intended to replace advice given to you by your health care provider. Make sure you discuss any questions you have with your health care provider.

## 2014-08-31 NOTE — ED Provider Notes (Signed)
Dillon Mcgee is a 54 y.o. male who presents to Urgent Care today for Back and leg pain. Patient has a history of intermittent right-sided low back pain with pain radiating to his right leg. This previously was managed via orthopedics with intermittent intermuscular cortical steroid injections. However he has lost his health insurance and is now [facility]. He notes that over the past few days to weeks he's had worsening right low back pain and pain radiating to his right leg. He denies any weakness or numbness in his legs or bowel or bladder problems. He additionally notes a tingling burning sensation to the skin on the lateral upper thigh and his right.   Past Medical History  Diagnosis Date  . Diabetes mellitus without complication   . Hyperlipidemia    Past Surgical History  Procedure Laterality Date  . Colonoscopy      5 years ago    History  Substance Use Topics  . Smoking status: Never Smoker   . Smokeless tobacco: Not on file  . Alcohol Use: No   ROS as above Medications: No current facility-administered medications for this encounter.   Current Outpatient Prescriptions  Medication Sig Dispense Refill  . aspirin EC 81 MG tablet Take 81 mg by mouth daily.    . ferrous sulfate 325 (65 FE) MG tablet Take 325 mg by mouth daily with breakfast.    . gabapentin (NEURONTIN) 300 MG capsule Take 300 mg by mouth 3 (three) times daily.    Marland Kitchen. glucose blood test strip Use as instructed 100 each 12  . hydrochlorothiazide (HYDRODIURIL) 12.5 MG tablet Take 12.5 mg by mouth daily.    Marland Kitchen. ibuprofen (ADVIL,MOTRIN) 600 MG tablet Take 1 tablet (600 mg total) by mouth every 8 (eight) hours as needed. 30 tablet 1  . insulin aspart (NOVOLOG) 100 UNIT/ML injection Inject 10 Units into the skin 3 (three) times daily before meals. 30 mL 3  . Insulin Syringes, Disposable, U-100 0.5 ML MISC Used to inject into the skin as prescribed by physician 100 each 12  . metFORMIN  (GLUCOPHAGE) 1000 MG tablet Take 1 tablet (1,000 mg total) by mouth 2 (two) times daily with a meal. 60 tablet 2  . pioglitazone (ACTOS) 15 MG tablet Take 15 mg by mouth daily.    . pravastatin (PRAVACHOL) 20 MG tablet Take 20 mg by mouth daily.    . Vitamin D, Ergocalciferol, (DRISDOL) 50000 UNITS CAPS capsule Take 1 capsule (50,000 Units total) by mouth every 7 (seven) days. 12 capsule 0   No Known Allergies   Exam:  BP 139/62 mmHg  Pulse 83  Temp(Src) 98.7 F (37.1 C) (Oral)  Resp 16  SpO2 98% Gen: Well NAD morbidly obese HEENT: EOMI,  MMM  Lungs: Normal work of breathing. CTABL Heart: RRR no MRG Abd: NABS, Soft. , Nontender large pannus Exts: Brisk capillary refill, warm and well perfused.  Back: Nontender normal back range of motion. Lower extremity strength is equal and normal bilaterally. Neck sign reflexes are equal and normal bilaterally. Sensation is intact throughout. Negative straight leg raise test bilaterally  No results found for this or any previous visit (from the past 24 hour(s)). No results found.  Assessment and Plan: 54 y.o. male with sciatica. Patient was given 80 mg IM Depo-Medrol injection as this is working very well in the past. Warned about hyperglycemia with this medication. 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 Advised weight loss. Follow up  with sports medicine.  Discussed warning signs or symptoms. Please see discharge instructions. Patient expresses understanding.     Dillon Bong, MD 08/31/14 [phone]

## 2014-09-08 ENCOUNTER — Ambulatory Visit: Payer: Self-pay | Attending: Internal Medicine

## 2014-09-08 ENCOUNTER — Telehealth: Payer: Self-pay | Admitting: Internal Medicine

## 2014-09-08 ENCOUNTER — Other Ambulatory Visit: Payer: Self-pay

## 2014-09-08 DIAGNOSIS — M5489 Other dorsalgia: Secondary | ICD-10-CM

## 2014-09-08 NOTE — Telephone Encounter (Signed)
Patient has come in today for a financial assistance appointment; patient was recently seen in the [facility] where [facility]; patient was told to come over to his PCP for the referral to be placed; please f/u with patient as he has not been seen in our office since March

## 2014-09-25 ENCOUNTER — Encounter: Payer: Self-pay | Admitting: Family Medicine

## 2014-09-25 ENCOUNTER — Ambulatory Visit (INDEPENDENT_AMBULATORY_CARE_PROVIDER_SITE_OTHER): Payer: Self-pay | Admitting: Family Medicine

## 2014-09-25 VITALS — BP 128/75 | Ht 75.0 in | Wt 360.0 lb

## 2014-09-25 DIAGNOSIS — M4726 Other spondylosis with radiculopathy, lumbar region: Secondary | ICD-10-CM

## 2014-09-26 ENCOUNTER — Other Ambulatory Visit: Payer: Self-pay

## 2014-09-26 DIAGNOSIS — M47816 Spondylosis without myelopathy or radiculopathy, lumbar region: Secondary | ICD-10-CM | POA: Insufficient documentation

## 2014-09-26 MED ORDER — INSULIN ASPART 100 UNIT/ML ~~LOC~~ SOLN: 10.0000 [IU] | Freq: Three times a day (TID) | SUBCUTANEOUS | Status: DC

## 2014-09-26 NOTE — Progress Notes (Signed)
Patient ID: Dillon Mcgee, male   DOB: Feb 01, 1961, 54 y.o.   MRN: [id number]  Dillon Mcgee - 54 y.o. 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 This has been going on years. At some point he had custom molded orthotics and received intramuscular injections of steroid-induced which seemed to improve his symptoms. He has unfortunately lost his insurance coverage and has not had regular health care for some time. He was seen at urgent care center or back pain and given intramuscular sterile injection which helped his symptoms about 60%. He is here today for further valuation and workup.  Pain is in the low back radiating down to the posterior and lateral portion of the upper thigh on the right. Occasionally will radiate to the sole of the foot but not always. He does not have any numbness in his leg and does not feel weak, he's had no giving way of his leg. ROS:     He denies any unusual weight change recently. No fever, sweats, chills. No incontinence of bowel or bladder. No leg weakness.  PERTINENT  PMH / PSH FH / / SH:  Past Medical, Surgical, Social, and Family History Reviewed & Updated in the EMR.  Pertinent findings include:  Diabetes mellitus Obesity with most of his weight and the abdominal area Hyperlipidemia   Pertinent past imaging: MRI of December 2011 LS-spine.L4-5: Mild disc degeneration with small Schmorl's node deformity and adjacent bony reactive changes. Mild bilateral facet joint degenerative changes more notable on the left with fluid within the joint space. Bulge with superimposed large left posterior lateral extruded disc with a slight caudal extension and compression of the left lateral aspect of the thecal sac with elevation of the posterior longitudinal ligament extends from the L3-4 through the L5-S1 level. Right lateral extension of disc/osteophyte touches but  does not compress the exiting right L4 nerve root.  L5-S1: Very mild facet joint degenerative changes greater on the right. Minimal bulge. No significant spinal stenosis or foraminal Narrowing  OBJECTIVE: BP 128/75 mmHg  Ht  (1.905 m)  Wt 360 lb (163.295 kg)  BMI 45.00 kg/m2  Physical Exam:  Vital signs are reviewed. GEN.: Well-developed obese male, no acute distress. GAIT: Very slight wide-based to his gait but otherwise normal in stride length. BACK: Nontender to palpation and percussion. There is no defect. He has some mild tenderness to palpation in the gluteus medius area on the right but this does not reproduce his symptoms. There is no tenderness over the issue of crest. HIPS: Bilaterally internal and external rotation is full. There is no tenderness to palpation of either greater trochanter. STRAIGHT leg raise. Positive in the right at 90 in the seated and supine position. EXTREMITY: Lower strandy strength 5 out of 5 hip flexion extension. He can get on off exam table without any assistance. NEURO: DTRs at the knee and ankle 1-2+ bilaterally symmetrical.  ASSESSMENT & PLAN:  See problem based charting & AVS for pt instructions.

## 2014-09-26 NOTE — Assessment & Plan Note (Signed)
I had a long discussion with him about treatment options. Certainly if he could reduce some of his truncal obesity, it might take some pressure off his spine. 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 I would like to avoid doing serial intramuscular injections of cortical stereo for many reasons including his diabetes etc. He would be open to considering epidural sterile injection which I think might be very beneficial and probably for longer period of time. We discussed that at length because he has some concerns about he procedure and risks of paralysis etc. I think at this dose. He's quite concerned about finances and doesn't want to incur large bill so we are checking with radiology to see how much this would cost on his current discounted hospital indigent plan. I be happy see him back in follow-up. If he is able to afford it, we will set him up for the epidural sterile injection.

## 2014-10-11 ENCOUNTER — Other Ambulatory Visit: Payer: Self-pay | Admitting: Internal Medicine

## 2014-10-12 ENCOUNTER — Other Ambulatory Visit: Payer: Self-pay | Admitting: Internal Medicine

## 2014-10-25 ENCOUNTER — Encounter: Payer: Self-pay | Admitting: Internal Medicine

## 2014-10-25 ENCOUNTER — Ambulatory Visit: Payer: Self-pay | Attending: Internal Medicine | Admitting: Internal Medicine

## 2014-10-25 VITALS — BP 115/75 | HR 96 | Temp 98.0°F | Resp 16 | Ht 75.0 in | Wt 368.4 lb

## 2014-10-25 DIAGNOSIS — E785 Hyperlipidemia, unspecified: Secondary | ICD-10-CM | POA: Insufficient documentation

## 2014-10-25 DIAGNOSIS — E1140 Type 2 diabetes mellitus with diabetic neuropathy, unspecified: Secondary | ICD-10-CM | POA: Insufficient documentation

## 2014-10-25 DIAGNOSIS — I10 Essential (primary) hypertension: Secondary | ICD-10-CM | POA: Insufficient documentation

## 2014-10-25 DIAGNOSIS — E669 Obesity, unspecified: Secondary | ICD-10-CM | POA: Insufficient documentation

## 2014-10-25 LAB — GLUCOSE, POCT (MANUAL RESULT ENTRY): POC GLUCOSE: 142 mg/dL — AB (ref 70–99)

## 2014-10-25 LAB — POCT GLYCOSYLATED HEMOGLOBIN (HGB A1C): HEMOGLOBIN A1C: 7.2

## 2014-10-25 NOTE — Progress Notes (Signed)
Patient here for follow up on his diabetes and in need Of refill on his metformin

## 2014-10-26 NOTE — Progress Notes (Signed)
Patient ID: Dillon Mcgee, male   DOB: December 18, 1960, 54 y.o.   MRN: [id number] SUBJECTIVE: 54 y.o. male for follow up of diabetes, HTN, and HLD. Diabetic Review of Systems - medication compliance: compliant all of the time, diabetic diet compliance: compliant all of the time, home glucose monitoring: is performed sporadically, further diabetic ROS: no polyuria or polydipsia, no chest pain, dyspnea or TIA's, no unusual visual symptoms, no hypoglycemia, has dysesthesias in the feet.  He is tolerating pravastatin well, denies claudication or myalgias.  Current Outpatient Prescriptions  Medication Sig Dispense Refill  . aspirin EC 81 MG tablet Take 81 mg by mouth daily.    . ferrous sulfate 325 (65 FE) MG tablet Take 325 mg by mouth daily with breakfast.    . gabapentin (NEURONTIN) 300 MG capsule Take 300 mg by mouth 3 (three) times daily.    Marland Kitchen glucose blood test strip Use as instructed 100 each 12  . hydrochlorothiazide (HYDRODIURIL) 12.5 MG tablet Take 12.5 mg by mouth daily.    Marland Kitchen ibuprofen (ADVIL,MOTRIN) 600 MG tablet Take 1 tablet (600 mg total) by mouth every 8 (eight) hours as needed. 30 tablet 1  . insulin aspart (NOVOLOG) 100 UNIT/ML injection Inject 10 Units into the skin 3 (three) times daily before meals. 30 mL 3  . Insulin Syringes, Disposable, U-100 0.5 ML MISC Used to inject into the skin as prescribed by physician 100 each 12  . metFORMIN (GLUCOPHAGE) 1000 MG tablet Take 1 tablet (1,000 mg total) by mouth 2 (two) times daily with a meal. 60 tablet 2  . pioglitazone (ACTOS) 15 MG tablet Take 15 mg by mouth daily.    . pravastatin (PRAVACHOL) 20 MG tablet Take 20 mg by mouth daily.    . Vitamin D, Ergocalciferol, (DRISDOL) 50000 UNITS CAPS capsule Take 1 capsule (50,000 Units total) by mouth every 7 (seven) days. 12 capsule 0   No current facility-administered medications for this visit.   Review of Systems  Neurological: Positive for tingling. Negative for headaches.  All other systems  reviewed and are negative.   OBJECTIVE: Appearance: alert, well appearing, and in no distress, oriented to person, place, and time and overweight. BP 115/75 mmHg  Pulse 96  Temp(Src) 98 F (36.7 C)  Resp 16  Ht  (1.905 m)  Wt 368 lb 6.4 oz (167.105 kg)  BMI 46.05 kg/m2  SpO2 100%  Exam: heart sounds normal rate, regular rhythm, normal S1, S2, no murmurs, rubs, clicks or gallops, no JVD, no carotid bruits. No edema.   ASSESSMENT: Diabetes Mellitus with neuropathy: well controlled and needs to follow diet more regularly  HTN: Patient blood pressure is stable and may continue on current medication.  Education on diet, exercise, and modifiable risk factors discussed. Will obtain appropriate labs as needed. Will follow up in 3-6 months.  HLD: continue pravastatin, stable. Obesity: Weight loss discussed at length and its complications to health.  Patient will loss 10 months by next visit in 3 months.  Diet and exercise discussed as well as calorie intake.  PLAN: See orders for this visit as documented in the electronic medical record. Issues reviewed with him: diabetic diet discussed in detail, written exchange diet given, low cholesterol diet, weight control and daily exercise discussed, home glucose monitoring emphasized, foot care discussed and Podiatry visits discussed, annual eye examinations at Ophthalmology discussed and labs immediately prior to next visit. Refills were written and given to patient during Epic downtime.   Return in about 3 months (  around 01/25/2015) for Diabetes Mellitus.    Ambrose Finland, NP 10/26/2014 2:37 PM

## 2014-12-14 ENCOUNTER — Telehealth: Payer: Self-pay

## 2014-12-14 MED ORDER — GABAPENTIN 300 MG PO CAPS
300.0000 mg | ORAL_CAPSULE | Freq: Three times a day (TID) | ORAL | Status: DC
Start: 2014-12-14 — End: 2015-06-22

## 2014-12-14 NOTE — Telephone Encounter (Signed)
Returned patient phone call Patient not available Left message on voice mail to return our call Per Holland Commonsvalerie keck it is ok to fill gababpentin [facility]

## 2015-01-03 ENCOUNTER — Telehealth: Payer: Self-pay

## 2015-01-03 NOTE — Telephone Encounter (Signed)
Returned patient phone call Patient stated he will no longer be able to get his metformin for free From [facility] and was inquiring prices at our pharmacy He is aware the cost here will be four dollars

## 2015-01-04 ENCOUNTER — Telehealth: Payer: Self-pay

## 2015-01-04 NOTE — Telephone Encounter (Signed)
Returned patient phone call Patient not available Left message on voice mail that [facility]'s will be transferred [facility] per patient request

## 2015-01-10 ENCOUNTER — Other Ambulatory Visit: Payer: Self-pay

## 2015-01-12 ENCOUNTER — Other Ambulatory Visit: Payer: Self-pay | Admitting: Internal Medicine

## 2015-02-19 ENCOUNTER — Emergency Department (HOSPITAL_BASED_OUTPATIENT_CLINIC_OR_DEPARTMENT_OTHER)
Admission: EM | Admit: 2015-02-19 | Discharge: 2015-02-19 | Disposition: A | Discharge status: Home / Self Care | Payer: Worker's Compensation | Attending: Emergency Medicine | Admitting: Emergency Medicine

## 2015-02-19 ENCOUNTER — Encounter (HOSPITAL_BASED_OUTPATIENT_CLINIC_OR_DEPARTMENT_OTHER): Payer: Self-pay | Admitting: Emergency Medicine

## 2015-02-19 DIAGNOSIS — I10 Essential (primary) hypertension: Secondary | ICD-10-CM | POA: Insufficient documentation

## 2015-02-19 DIAGNOSIS — Z7982 Long term (current) use of aspirin: Secondary | ICD-10-CM | POA: Insufficient documentation

## 2015-02-19 DIAGNOSIS — Y998 Other external cause status: Secondary | ICD-10-CM | POA: Insufficient documentation

## 2015-02-19 DIAGNOSIS — Y9389 Activity, other specified: Secondary | ICD-10-CM | POA: Diagnosis not present

## 2015-02-19 DIAGNOSIS — W208XXA Other cause of strike by thrown, projected or falling object, initial encounter: Secondary | ICD-10-CM | POA: Diagnosis not present

## 2015-02-19 DIAGNOSIS — Z7984 Long term (current) use of oral hypoglycemic drugs: Secondary | ICD-10-CM | POA: Insufficient documentation

## 2015-02-19 DIAGNOSIS — Z794 Long term (current) use of insulin: Secondary | ICD-10-CM | POA: Insufficient documentation

## 2015-02-19 DIAGNOSIS — S01111A Laceration without foreign body of right eyelid and periocular area, initial encounter: Secondary | ICD-10-CM | POA: Insufficient documentation

## 2015-02-19 DIAGNOSIS — E119 Type 2 diabetes mellitus without complications: Secondary | ICD-10-CM | POA: Insufficient documentation

## 2015-02-19 DIAGNOSIS — Z79899 Other long term (current) drug therapy: Secondary | ICD-10-CM | POA: Diagnosis not present

## 2015-02-19 DIAGNOSIS — E785 Hyperlipidemia, unspecified: Secondary | ICD-10-CM | POA: Insufficient documentation

## 2015-02-19 DIAGNOSIS — Y9289 Other specified places as the place of occurrence of the external cause: Secondary | ICD-10-CM | POA: Insufficient documentation

## 2015-02-19 DIAGNOSIS — S0181XA Laceration without foreign body of other part of head, initial encounter: Secondary | ICD-10-CM | POA: Diagnosis present

## 2015-02-19 HISTORY — DX: Essential (primary) hypertension: I10

## 2015-02-19 NOTE — Discharge Instructions (Signed)
Local wound care with bacitracin twice daily.  Return to the emergency department if symptoms significantly worsen or change.   Laceration Care, Adult A laceration is a cut that goes through all of the layers of the skin and into the tissue that is right under the skin. Some lacerations heal on their own. Others need to be closed with stitches (sutures), staples, skin adhesive strips, or skin glue. Proper laceration care minimizes the risk of infection and helps the laceration to heal better. HOW TO CARE FOR YOUR LACERATION If sutures or staples were used:  Keep the wound clean and dry.  If you were given a bandage (dressing), you should change it at least one time per day or as told by your health care provider. You should also change it if it becomes wet or dirty.  Keep the wound completely dry for the first 24 hours or as told by your health care provider. After that time, you may shower or bathe. However, make sure that the wound is not soaked in water until after the sutures or staples have been removed.  Clean the wound one time each day or as told by your health care provider:  Wash the wound with soap and water.  Rinse the wound with water to remove all soap.  Pat the wound dry with a clean towel. Do not rub the wound.  After cleaning the wound, apply a thin layer of antibiotic ointmentas told by your health care provider. This will help to prevent infection and keep the dressing from sticking to the wound.  Have the sutures or staples removed as told by your health care provider. If skin adhesive strips were used:  Keep the wound clean and dry.  If you were given a bandage (dressing), you should change it at least one time per day or as told by your health care provider. You should also change it if it becomes dirty or wet.  Do not get the skin adhesive strips wet. You may shower or bathe, but be careful to keep the wound dry.  If the wound gets wet, pat it dry with a  clean towel. Do not rub the wound.  Skin adhesive strips fall off on their own. You may trim the strips as the wound heals. Do not remove skin adhesive strips that are still stuck to the wound. They will fall off in time. If skin glue was used:  Try to keep the wound dry, but you may briefly wet it in the shower or bath. Do not soak the wound in water, such as by swimming.  After you have showered or bathed, gently pat the wound dry with a clean towel. Do not rub the wound.  Do not do any activities that will make you sweat heavily until the skin glue has fallen off on its own.  Do not apply liquid, cream, or ointment medicine to the wound while the skin glue is in place. Using those may loosen the film before the wound has healed.  If you were given a bandage (dressing), you should change it at least one time per day or as told by your health care provider. You should also change it if it becomes dirty or wet.  If a dressing is placed over the wound, be careful not to apply tape directly over the skin glue. Doing that may cause the glue to be pulled off before the wound has healed.  Do not pick at the glue. The skin glue  usually remains in place for 5-10 days, then it falls off of the skin. General Instructions  Take over-the-counter and prescription medicines only as told by your health care provider.  If you were prescribed an antibiotic medicine or ointment, take or apply it as told by your doctor. Do not stop using it even if your condition improves.  To help prevent scarring, make sure to cover your wound with sunscreen whenever you are outside after stitches are removed, after adhesive strips are removed, or when glue remains in place and the wound is healed. Make sure to wear a sunscreen of at least 30 SPF.  Do not scratch or pick at the wound.  Keep all follow-up visits as told by your health care provider. This is important.  Check your wound every day for signs of infection.  Watch for:  Redness, swelling, or pain.  Fluid, blood, or pus.  Raise (elevate) the injured area above the level of your heart while you are sitting or lying down, if possible. SEEK MEDICAL CARE IF:  You received a tetanus shot and you have swelling, severe pain, redness, or bleeding at the injection site.  You have a fever.  A wound that was closed breaks open.  You notice a bad smell coming from your wound or your dressing.  You notice something coming out of the wound, such as wood or glass.  Your pain is not controlled with medicine.  You have increased redness, swelling, or pain at the site of your wound.  You have fluid, blood, or pus coming from your wound.  You notice a change in the color of your skin near your wound.  You need to change the dressing frequently due to fluid, blood, or pus draining from the wound.  You develop a new rash.  You develop numbness around the wound. SEEK IMMEDIATE MEDICAL CARE IF:  You develop severe swelling around the wound.  Your pain suddenly increases and is severe.  You develop painful lumps near the wound or on skin that is anywhere on your body.  You have a red streak going away from your wound.  The wound is on your hand or foot and you cannot properly move a finger or toe.  The wound is on your hand or foot and you notice that your fingers or toes look pale or bluish.   This information is not intended to replace advice given to you by your health care provider. Make sure you discuss any questions you have with your health care provider.   Document Released: 02/17/2005 Document Revised: 07/04/2014 Document Reviewed: 02/13/2014 Elsevier Interactive Patient Education [employer]2016 [employer]

## 2015-02-19 NOTE — ED Notes (Signed)
Part of a metal Christmas fell and struckrt eye lid while at wrm

## 2015-02-19 NOTE — ED Notes (Signed)
MD at bedside. 

## 2015-02-19 NOTE — ED Provider Notes (Signed)
CSN: [id number]     Arrival date & time 02/19/15  0805 History   First MD Initiated Contact with Patient 02/19/15 [phone]     Chief Complaint  Patient presents with  . Facial Laceration     (Consider location/radiation/quality/duration/timing/severity/associated sxs/prior Treatment) HPI Comments: Patient is a 54 year old male who presents with complaints of laceration to his right eyelid. He states that he was putting up Christmas decorations when one fell and struck him above the eye. He denies any difficulty with his vision. He does report a small laceration to the right eyelid.  The history is provided by the patient.    Past Medical History  Diagnosis Date  . Diabetes mellitus without complication (HCC)   . Hyperlipidemia   . Hypertension    Past Surgical History  Procedure Laterality Date  . Colonoscopy      5 years ago    Family History  Problem Relation Age of Onset  . Heart disease Maternal Aunt   . Diabetes Maternal Uncle    Social History  Substance Use Topics  . Smoking status: Never Smoker   . Smokeless tobacco: None  . Alcohol Use: No    Review of Systems  All other systems reviewed and are negative.     Allergies  Review of patient's allergies indicates no known allergies.  Home Medications   Prior to Admission medications   Medication Sig Start Date End Date Taking? Authorizing Provider  aspirin EC 81 MG tablet Take 81 mg by mouth daily.    Historical Provider, MD  ferrous sulfate 325 (65 FE) MG tablet Take 325 mg by mouth daily with breakfast.    Historical Provider, MD  gabapentin (NEURONTIN) 300 MG capsule Take 1 capsule (300 mg total) by mouth 3 (three) times daily. 12/14/14   Ambrose FinlandValerie A Keck, NP  glucose blood test strip Use as instructed 02/23/14   Doris Cheadleeepak Advani, MD  hydrochlorothiazide (HYDRODIURIL) 12.5 MG tablet Take 12.5 mg by mouth daily.    Historical Provider, MD  ibuprofen (ADVIL,MOTRIN) 600 MG tablet Take 1 tablet (600 mg total) by  mouth every 8 (eight) hours as needed. 05/31/14   Doris Cheadleeepak Advani, MD  insulin aspart (NOVOLOG) 100 UNIT/ML injection Inject 10 Units into the skin 3 (three) times daily before meals. 09/26/14   Doris Cheadleeepak Advani, MD  Insulin Syringes, Disposable, U-100 0.5 ML MISC Used to inject into the skin as prescribed by physician 02/23/14   Doris Cheadleeepak Advani, MD  metFORMIN (GLUCOPHAGE) 1000 MG tablet Take 1 tablet (1,000 mg total) by mouth 2 (two) times daily with a meal. 07/13/14   Doris Cheadleeepak Advani, MD  pioglitazone (ACTOS) 30 MG tablet TAKE 1/2 TABLET BY MOUTH DAILY 01/18/15   Ambrose FinlandValerie A Keck, NP  pravastatin (PRAVACHOL) 20 MG tablet Take 20 mg by mouth daily.    Historical Provider, MD  Vitamin D, Ergocalciferol, (DRISDOL) 50000 UNITS CAPS capsule Take 1 capsule (50,000 Units total) by mouth every 7 (seven) days. 02/23/14   Doris Cheadleeepak Advani, MD   BP 128/84 mmHg  Pulse 78  Temp(Src) 97.5 F (36.4 C) (Oral)  Resp 16  Ht 6\' 3"  (1.905 m)  Wt 375 lb (170.099 kg)  BMI 46.87 kg/m2  SpO2 98% Physical Exam  Constitutional: He is oriented to person, place, and time. He appears well-developed and well-nourished. No distress.  HENT:  Head: Normocephalic.  There is a small 0.5 cm laceration to the right eyelid.  Eyes: EOM are normal. Pupils are equal, round, and reactive to light.  The  remainder of the eye exam is otherwise unremarkable. Pupils are equally round and reactive. Extraocular muscles are intact. There is no diplopia with upward gaze and no evidence for orbital entrapment. The anterior chambers clear.  Neurological: He is alert and oriented to person, place, and time.  Skin: Skin is warm and dry. He is not diaphoretic.  Nursing note and vitals reviewed.   ED Course  Procedures (including critical care time) Labs Review Labs Reviewed - No data to display  Imaging Review No results found. I have personally reviewed and evaluated these images and lab results as part of my medical decision-making.   EKG  Interpretation None      MDM   Final diagnoses:  None    The laceration above the eye does not require stitches. It is well approximated and somewhat superficial. There is no evidence for any globe injury or eye socket injury.    Geoffery Lyons, MD 02/19/15 [phone]

## 2015-02-19 NOTE — ED Notes (Signed)
Patient preparing for discharge. 

## 2015-03-12 MED FILL — PRAVASTATIN NA 40 MG TAB: 40 | 30 days supply | Qty: 30 | Fill #4

## 2015-03-12 MED FILL — HYDROCHLOROTHIAZIDE 12.5 MG: 12.5 | 30 days supply | Qty: 30 | Fill #4

## 2015-03-12 MED FILL — GABAPENTIN 300 MG CAPSULE: 300 | 30 days supply | Qty: 90 | Fill #2

## 2015-03-12 MED FILL — PIOGLITAZONE HCL 30 MG TAB: 30 | 30 days supply | Qty: 15 | Fill #2

## 2015-03-13 ENCOUNTER — Other Ambulatory Visit: Payer: Self-pay | Admitting: Internal Medicine

## 2015-03-13 MED FILL — !NOVOLOG 100UNITS/ML VIAL: 100/ML | 90 days supply | Qty: 30 | Fill #0

## 2015-03-13 MED FILL — ?METFORMIN HCL 1,000 MG TAB: 1000 | 30 days supply | Qty: 60 | Fill #1

## 2015-03-13 MED FILL — TRUEPLUS SYR 1ML 30GX5/16: 30G X 5/16" | 25 days supply | Qty: 100 | Fill #0

## 2015-03-16 ENCOUNTER — Ambulatory Visit: Payer: Self-pay | Attending: Family Medicine

## 2015-04-03 ENCOUNTER — Encounter (HOSPITAL_BASED_OUTPATIENT_CLINIC_OR_DEPARTMENT_OTHER): Payer: Self-pay | Admitting: *Deleted

## 2015-04-03 ENCOUNTER — Emergency Department (HOSPITAL_BASED_OUTPATIENT_CLINIC_OR_DEPARTMENT_OTHER)
Admission: EM | Admit: 2015-04-03 | Discharge: 2015-04-03 | Disposition: A | Discharge status: Home / Self Care | Payer: Self-pay | Attending: Emergency Medicine | Admitting: Emergency Medicine

## 2015-04-03 DIAGNOSIS — Z79899 Other long term (current) drug therapy: Secondary | ICD-10-CM | POA: Insufficient documentation

## 2015-04-03 DIAGNOSIS — G8929 Other chronic pain: Secondary | ICD-10-CM | POA: Insufficient documentation

## 2015-04-03 DIAGNOSIS — Z7984 Long term (current) use of oral hypoglycemic drugs: Secondary | ICD-10-CM | POA: Insufficient documentation

## 2015-04-03 DIAGNOSIS — E785 Hyperlipidemia, unspecified: Secondary | ICD-10-CM | POA: Insufficient documentation

## 2015-04-03 DIAGNOSIS — Z794 Long term (current) use of insulin: Secondary | ICD-10-CM | POA: Insufficient documentation

## 2015-04-03 DIAGNOSIS — M5431 Sciatica, right side: Secondary | ICD-10-CM | POA: Insufficient documentation

## 2015-04-03 DIAGNOSIS — I10 Essential (primary) hypertension: Secondary | ICD-10-CM | POA: Insufficient documentation

## 2015-04-03 DIAGNOSIS — E119 Type 2 diabetes mellitus without complications: Secondary | ICD-10-CM | POA: Insufficient documentation

## 2015-04-03 DIAGNOSIS — Z7982 Long term (current) use of aspirin: Secondary | ICD-10-CM | POA: Insufficient documentation

## 2015-04-03 MED ORDER — METHYLPREDNISOLONE ACETATE 40 MG/ML IJ SUSP
80.0000 mg | Freq: Once | INTRAMUSCULAR | Status: DC
  Filled 2015-04-03: qty 2

## 2015-04-03 MED ORDER — PREDNISONE 50 MG PO TABS
60.0000 mg | ORAL_TABLET | Freq: Once | ORAL | Status: AC
  Administered 2015-04-03: 60 mg via ORAL
  Filled 2015-04-03: qty 1

## 2015-04-03 MED ORDER — PREDNISONE 10 MG PO TABS: 40.0000 mg | ORAL_TABLET | Freq: Every day | ORAL | Status: DC

## 2015-04-03 MED ORDER — HYDROCODONE-ACETAMINOPHEN 5-325 MG PO TABS: 1.0000 | ORAL_TABLET | Freq: Four times a day (QID) | ORAL | Status: DC | PRN

## 2015-04-03 MED FILL — HYDROCODON-APAP 5-325: 5-325 | 2 days supply | Qty: 10 | Fill #0

## 2015-04-03 MED FILL — predniSONE 10 MG TABS: 10 | 5 days supply | Qty: 20 | Fill #0

## 2015-04-03 NOTE — ED Notes (Signed)
MD at bedside. 

## 2015-04-03 NOTE — ED Provider Notes (Signed)
CSN: [id number]     Arrival date & time 04/03/15  0914 History   First MD Initiated Contact with Patient 04/03/15 [phone]     No chief complaint on file.    (Consider location/radiation/quality/duration/timing/severity/associated sxs/prior Treatment) The history is provided by the patient.   55 year old male with history of low back pain and right-sided sciatica. Last exacerbation was in June 2016. Patient was seen at the [facility] urgent care and received a steroid injection I am not into the back. That was Depo-Medrol. Patient the back pain started to flareup a week ago and has been getting worse. Does have pain radiating into the right leg but no significant numbness or weakness to the right leg. No incontinence. Pain currently 8 out of 10. Made worse with movement. No falls or injuries.  Past Medical History  Diagnosis Date  . Diabetes mellitus without complication (HCC)   . Hyperlipidemia   . Hypertension    Past Surgical History  Procedure Laterality Date  . Colonoscopy      5 years ago    Family History  Problem Relation Age of Onset  . Heart disease Maternal Aunt   . Diabetes Maternal Uncle    Social History  Substance Use Topics  . Smoking status: Never Smoker   . Smokeless tobacco: None  . Alcohol Use: No    Review of Systems  Constitutional: Negative for fever.  HENT: Negative for congestion.   Eyes: Negative for visual disturbance.  Respiratory: Negative for shortness of breath.   Cardiovascular: Negative for chest pain.  Gastrointestinal: Negative for abdominal pain.  Genitourinary: Negative for dysuria.  Musculoskeletal: Positive for back pain.  Skin: Negative for rash.  Neurological: Negative for weakness and numbness.  Psychiatric/Behavioral: Negative for confusion.      Allergies  Review of patient's allergies indicates no known allergies.  Home Medications   Prior to Admission medications   Medication Sig Start Date End Date Taking? Authorizing  Provider  aspirin EC 81 MG tablet Take 81 mg by mouth daily.   Yes Historical Provider, MD  ferrous sulfate 325 (65 FE) MG tablet Take 325 mg by mouth daily with breakfast.   Yes Historical Provider, MD  gabapentin (NEURONTIN) 300 MG capsule Take 1 capsule (300 mg total) by mouth 3 (three) times daily. 12/14/14  Yes Ambrose Finland, NP  glucose blood test strip Use as instructed 02/23/14  Yes Deepak Advani, MD  hydrochlorothiazide (HYDRODIURIL) 12.5 MG tablet Take 12.5 mg by mouth daily.   Yes Historical Provider, MD  ibuprofen (ADVIL,MOTRIN) 600 MG tablet Take 1 tablet (600 mg total) by mouth every 8 (eight) hours as needed. 05/31/14  Yes Deepak Advani, MD  insulin aspart (NOVOLOG) 100 UNIT/ML injection Inject 10 Units into the skin 3 (three) times daily before meals. 09/26/14  Yes Doris Cheadle, MD  metFORMIN (GLUCOPHAGE) 1000 MG tablet Take 1 tablet (1,000 mg total) by mouth 2 (two) times daily with a meal. 07/13/14  Yes Deepak Advani, MD  pioglitazone (ACTOS) 30 MG tablet TAKE 1/2 TABLET BY MOUTH DAILY 01/18/15  Yes Ambrose Finland, NP  pravastatin (PRAVACHOL) 20 MG tablet Take 20 mg by mouth daily.   Yes Historical Provider, MD  TRUEPLUS INSULIN SYRINGE 30G X 5/16" 0.5 ML MISC USE TO INJECT INTO THE SKIN AS PRESCRIBED BY PHYSICIAN 03/13/15  Yes Ambrose Finland, NP  Vitamin D, Ergocalciferol, (DRISDOL) 50000 UNITS CAPS capsule Take 1 capsule (50,000 Units total) by mouth every 7 (seven) days. 02/23/14  Yes  Doris Cheadle, MD  HYDROcodone-acetaminophen (NORCO/VICODIN) 5-325 MG tablet Take 1-2 tablets by mouth every 6 (six) hours as needed for moderate pain. 04/03/15   Vanetta Mulders, MD  predniSONE (DELTASONE) 10 MG tablet Take 4 tablets (40 mg total) by mouth daily. 04/03/15   Vanetta Mulders, MD   BP 129/82 mmHg  Pulse 80  Temp(Src) 98.3 F (36.8 C) (Oral)  Resp 18  Ht  (1.93 m)  Wt 172.367 kg  BMI 46.27 kg/m2  SpO2 100% Physical Exam  Constitutional: He is oriented to person, place, and  time. He appears well-developed and well-nourished. No distress.  HENT:  Head: Normocephalic and atraumatic.  Mouth/Throat: Oropharynx is clear and moist.  Eyes: Conjunctivae and EOM are normal. Pupils are equal, round, and reactive to light.  Neck: Normal range of motion. Neck supple.  Cardiovascular: Normal rate, regular rhythm and normal heart sounds.   No murmur heard. Pulmonary/Chest: Effort normal and breath sounds normal. No respiratory distress.  Abdominal: Soft. Bowel sounds are normal. There is no tenderness.  Musculoskeletal: Normal range of motion.  Neurological: He is alert and oriented to person, place, and time. No cranial nerve deficit. He exhibits normal muscle tone. Coordination normal.  Skin: Skin is warm.  Nursing note and vitals reviewed.   ED Course  Procedures (including critical care time) Labs Review Labs Reviewed - No data to display  Imaging Review No results found. I have personally reviewed and evaluated these images and lab results as part of my medical decision-making.   EKG Interpretation None      MDM   Final diagnoses:  Sciatica of right side    Patient with an exacerbation of his chronic of low back pain with the flareup of his right sciatica. No significant neurological deficits. Patient was hoping for a Depo-Medrol injection but we do not carry that out here to even give IM. Patient therefore given 60 mg of prednisone and be continued on prednisone. Patient will follow-up with sports medicine. Patient will check also with [facility] urgent care on possible follow-up there where in June he received a Depo-Medrol. Best we can tell they're currently not open. Work note provided.    Vanetta Mulders, MD 04/03/15 1013

## 2015-04-03 NOTE — Discharge Instructions (Signed)
Follow-up with orthopedic sports medicine. Take prednisone as directed. Take the hydrocodone as needed for pain relief in the short run here. Would expect the prednisone start to help. Return for any new or worse symptoms.

## 2015-04-03 NOTE — ED Notes (Signed)
C/o low back pain x 1 week. States he has hx of pinched nerve.

## 2015-04-03 NOTE — ED Notes (Signed)
Dr. Debroah Loop ware of  meds ordered not available.

## 2015-04-18 ENCOUNTER — Other Ambulatory Visit: Payer: Self-pay | Admitting: Internal Medicine

## 2015-04-18 MED FILL — PRAVASTATIN NA 40 MG TAB: 40 | 30 days supply | Qty: 30 | Fill #0

## 2015-04-18 MED FILL — PIOGLITAZONE HCL 30 MG TAB: 30 | 30 days supply | Qty: 15 | Fill #3

## 2015-04-18 MED FILL — ?HYDROCHLOROTHIAZIDE 12.5MG: 12.5 | 30 days supply | Qty: 30 | Fill #0

## 2015-05-17 ENCOUNTER — Other Ambulatory Visit: Payer: Self-pay | Admitting: Internal Medicine

## 2015-05-17 MED FILL — TRUEPLUS SYR 1ML 30GX5/16: 30G X 5/16" | 25 days supply | Qty: 100 | Fill #1

## 2015-05-18 MED FILL — PRAVASTATIN NA 40 MG TAB: 40 | 30 days supply | Qty: 30 | Fill #0

## 2015-05-18 MED FILL — PIOGLITAZONE HCL 30 MG TAB: 30 | 30 days supply | Qty: 15 | Fill #0

## 2015-05-18 MED FILL — metFORMIN HCL 1000 MG TABS: 1000 | 30 days supply | Qty: 60 | Fill #0

## 2015-05-31 ENCOUNTER — Ambulatory Visit: Payer: Self-pay | Attending: Internal Medicine | Admitting: Internal Medicine

## 2015-05-31 ENCOUNTER — Encounter: Payer: Self-pay | Admitting: Internal Medicine

## 2015-05-31 VITALS — BP 132/74 | HR 102 | Temp 98.1°F | Resp 16 | Ht 75.0 in | Wt 386.0 lb

## 2015-05-31 DIAGNOSIS — M545 Low back pain, unspecified: Secondary | ICD-10-CM

## 2015-05-31 DIAGNOSIS — G8929 Other chronic pain: Secondary | ICD-10-CM

## 2015-05-31 DIAGNOSIS — I10 Essential (primary) hypertension: Secondary | ICD-10-CM

## 2015-05-31 DIAGNOSIS — E139 Other specified diabetes mellitus without complications: Secondary | ICD-10-CM

## 2015-05-31 LAB — POCT GLYCOSYLATED HEMOGLOBIN (HGB A1C): HEMOGLOBIN A1C: 9.1

## 2015-05-31 LAB — GLUCOSE, POCT (MANUAL RESULT ENTRY): POC Glucose: 219 mg/dl — AB (ref 70–99)

## 2015-05-31 MED ORDER — METFORMIN HCL 1000 MG PO TABS: 1000.0000 mg | ORAL_TABLET | Freq: Two times a day (BID) | ORAL | Status: DC

## 2015-05-31 MED ORDER — MELOXICAM 15 MG PO TABS: 15.0000 mg | ORAL_TABLET | Freq: Every day | ORAL | Status: DC

## 2015-05-31 MED ORDER — PRAVASTATIN SODIUM 40 MG PO TABS: 40.0000 mg | ORAL_TABLET | Freq: Every day | ORAL | Status: DC

## 2015-05-31 MED ORDER — HYDROCHLOROTHIAZIDE 12.5 MG PO CAPS: 12.5000 mg | ORAL_CAPSULE | Freq: Every day | ORAL | Status: DC

## 2015-05-31 MED ORDER — PIOGLITAZONE HCL 30 MG PO TABS: 15.0000 mg | ORAL_TABLET | Freq: Every day | ORAL | Status: DC

## 2015-05-31 MED FILL — ?HYDROCHLOROTHIAZIDE 12.5MG: 12.5 | 30 days supply | Qty: 30 | Fill #0

## 2015-05-31 MED FILL — MELOXICAM 15 MG TABLET: 15 | 30 days supply | Qty: 30 | Fill #0

## 2015-05-31 NOTE — Progress Notes (Signed)
Patient ID: Dillon Mcgee, male   DOB: 02/01/1961, 55 y.o.   MRN: [id number] SUBJECTIVE: 55 y.o. male for follow up of diabetes and HTN. Diabetic Review of Systems - medication compliance: noncompliant some of the time, diabetic diet compliance: noncompliant much of the time (forget to take his Novolog), further diabetic ROS: no polyuria or polydipsia, no chest pain, dyspnea or TIA's, no numbness, tingling or pain in extremities, no unusual visual symptoms, no hypoglycemia.   Patient reports that he suffered a tailbone injury 7 years ago and now he is beginning to have more pain. Pain sharp and radiates down his right leg. Pain is aggravated by movement and activity. He would like a refill of his hydrocodone for pain. He would like medication refills today.   Current Outpatient Prescriptions  Medication Sig Dispense Refill  . aspirin EC 81 MG tablet Take 81 mg by mouth daily.    . ferrous sulfate 325 (65 FE) MG tablet Take 325 mg by mouth daily with breakfast.    . gabapentin (NEURONTIN) 300 MG capsule Take 1 capsule (300 mg total) by mouth 3 (three) times daily. 90 capsule 2  . glucose blood test strip Use as instructed 100 each 12  . hydrochlorothiazide (MICROZIDE) 12.5 MG capsule Take 1 capsule (12.5 mg total) by mouth daily. Needs office visit for more refills 30 capsule 0  . ibuprofen (ADVIL,MOTRIN) 600 MG tablet Take 1 tablet (600 mg total) by mouth every 8 (eight) hours as needed. 30 tablet 1  . insulin aspart (NOVOLOG) 100 UNIT/ML injection Inject 10 Units into the skin 3 (three) times daily before meals. 30 mL 3  . metFORMIN (GLUCOPHAGE) 1000 MG tablet Take 1 tablet (1,000 mg total) by mouth 2 (two) times daily with a meal. Must have office visit for refills 60 tablet 0  . pioglitazone (ACTOS) 30 MG tablet Take 0.5 tablets (15 mg total) by mouth daily. Must have office visit for refills 30 tablet 0  . pravastatin (PRAVACHOL) 40 MG tablet Take 1 tablet (40 mg total) by mouth daily. Must have  office visit for refills 30 tablet 0  . TRUEPLUS INSULIN SYRINGE 30G X 5/16" 0.5 ML MISC USE TO INJECT INTO THE SKIN AS PRESCRIBED BY PHYSICIAN 100 each 12  . Vitamin D, Ergocalciferol, (DRISDOL) 50000 UNITS CAPS capsule Take 1 capsule (50,000 Units total) by mouth every 7 (seven) days. 12 capsule 0  . HYDROcodone-acetaminophen (NORCO/VICODIN) 5-325 MG tablet Take 1-2 tablets by mouth every 6 (six) hours as needed for moderate pain. (Patient not taking: Reported on 05/31/2015) 10 tablet 0  . predniSONE (DELTASONE) 10 MG tablet Take 4 tablets (40 mg total) by mouth daily. (Patient not taking: Reported on 05/31/2015) 20 tablet 0   No current facility-administered medications for this visit.  ROS: Other than what is stated in HPI, all other systems are negative.   OBJECTIVE: Appearance: alert, well appearing, and in no distress, oriented to person, place, and time and overweight. BP 132/74 mmHg  Pulse 102  Temp(Src) 98.1 F (36.7 C) (Oral)  Resp 16  Ht 6\' 3"  (1.905 m)  Wt 386 lb (175.088 kg)  BMI 48.25 kg/m2  SpO2 95%  Exam: heart sounds normal rate, regular rhythm, normal S1, S2, no murmurs, rubs, clicks or gallops, no JVD, chest clear, no carotid bruits  ASSESSMENT: Dillon Mcgee was seen today for medication refill and tailbone pain.  Diagnoses and all orders for this visit:  Other specified diabetes mellitus without complications (HCC) -  POCT A1C -     Glucose (CBG) -     Microalbumin, urine -     pravastatin (PRAVACHOL) 40 MG tablet; Take 1 tablet (40 mg total) by mouth daily. Must have office visit for refills -     pioglitazone (ACTOS) 30 MG tablet; Take 0.5 tablets (15 mg total) by mouth daily. Must have office visit for refills -     metFORMIN (GLUCOPHAGE) 1000 MG tablet; Take 1 tablet (1,000 mg total) by mouth 2 (two) times daily with a meal. Must have office visit for refills -     Lipid panel; Future  Patients diabetes remains uncontrolled as evidence by hemoglobin a1c >8.   Patient has been non-compliant with medication regimen. Stressed the multiple complications associated with uncontrolled diabetes.  Patient will stay on current medication dose and report back to clinic with cbg log in 2 weeks.  Essential hypertension -     hydrochlorothiazide (MICROZIDE) 12.5 MG capsule; Take 1 capsule (12.5 mg total) by mouth daily. Needs office visit for more refills -     COMPLETE METABOLIC PANEL WITH GFR; Future Patient blood pressure is stable and may continue on current medication.  Education on diet, exercise, and modifiable risk factors discussed. Will obtain appropriate labs as needed. Will follow up in 3-6 months.   Chronic right-sided low back pain without sciatica -    Begin meloxicam (MOBIC) 15 MG tablet; Take 1 tablet (15 mg total) by mouth daily.   PLAN: See orders for this visit as documented in the electronic medical record. Issues reviewed with him: diabetic diet discussed in detail, written exchange diet given, low cholesterol diet, weight control and daily exercise discussed, long term diabetic complications discussed and weight loss highly advised to help with back and A1C.   Return in about 1 week (around 06/07/2015) for Lab Visit.  Ambrose Finland, NP 05/31/2015 3:51 PM

## 2015-05-31 NOTE — Progress Notes (Signed)
Patient's here for medication refills.  Patient c/o injury to tailbone back in 2010 that radiates down leg causing pain. Patient rates pain 1/10. Patient states it's painful with movement.

## 2015-06-01 LAB — MICROALBUMIN, URINE: Microalb, Ur: 3 mg/dL

## 2015-06-07 ENCOUNTER — Ambulatory Visit: Payer: Self-pay | Attending: Internal Medicine

## 2015-06-07 DIAGNOSIS — I10 Essential (primary) hypertension: Secondary | ICD-10-CM

## 2015-06-07 DIAGNOSIS — E139 Other specified diabetes mellitus without complications: Secondary | ICD-10-CM

## 2015-06-07 LAB — COMPLETE METABOLIC PANEL WITH GFR
ALT: 16 U/L (ref 9–46)
AST: 15 U/L (ref 10–35)
Albumin: 3.8 g/dL (ref 3.6–5.1)
Alkaline Phosphatase: 63 U/L (ref 40–115)
BUN: 19 mg/dL (ref 7–25)
CHLORIDE: 103 mmol/L (ref 98–110)
CO2: 28 mmol/L (ref 20–31)
Calcium: 9 mg/dL (ref 8.6–10.3)
Creat: 0.87 mg/dL (ref 0.70–1.33)
GFR, Est African American: >89 mL/min (ref >=60)
GFR, Est Non African American: >89 mL/min (ref >=60)
GLUCOSE: 161 mg/dL — AB (ref 65–99)
POTASSIUM: 4.9 mmol/L (ref 3.5–5.3)
SODIUM: 140 mmol/L (ref 135–146)
Total Bilirubin: 0.3 mg/dL (ref 0.2–1.2)
Total Protein: 6.4 g/dL (ref 6.1–8.1)

## 2015-06-07 LAB — LIPID PANEL
CHOL/HDL RATIO: 3.5 ratio (ref <=5.0)
Cholesterol: 169 mg/dL (ref 125–200)
HDL: 48 mg/dL (ref >=40)
LDL CALC: 88 mg/dL (ref <130)
Triglycerides: 164 mg/dL — ABNORMAL HIGH (ref <150)
VLDL: 33 mg/dL — AB (ref <30)

## 2015-06-18 ENCOUNTER — Telehealth: Payer: Self-pay

## 2015-06-18 NOTE — Telephone Encounter (Signed)
CMA called patient, patient verified name and DOB. Patient was given lab results verbalized he understood with no further questions. 

## 2015-06-18 NOTE — Telephone Encounter (Signed)
-----   Message from Ambrose FinlandValerie A Keck, NP sent at 06/14/2015  9:35 PM EDT ----- Continue pravastatin Labs look good

## 2015-06-22 ENCOUNTER — Other Ambulatory Visit: Payer: Self-pay | Admitting: Internal Medicine

## 2015-06-22 MED FILL — ?PRAVASTATIN NA 40 MG TAB: 40 MG | 30 days supply | Qty: 30 | Fill #0

## 2015-06-22 MED FILL — PIOGLITAZONE HCL 30 MG TAB: 30 | 30 days supply | Qty: 15 | Fill #1

## 2015-06-22 MED FILL — GABAPENTIN 300 MG CAPSULE: 300 | 30 days supply | Qty: 90 | Fill #0

## 2015-06-22 MED FILL — ?METFORMIN HCL 1,000 MG TAB: 1000 | 30 days supply | Qty: 60 | Fill #0

## 2015-06-25 ENCOUNTER — Other Ambulatory Visit: Payer: Self-pay | Admitting: Internal Medicine

## 2015-06-25 MED FILL — !NOVOLOG 100UNITS/ML VIAL: 100/ML | 33 days supply | Qty: 10 | Fill #0

## 2015-07-04 MED FILL — MELOXICAM 15 MG TABLET: 15 | 30 days supply | Qty: 30 | Fill #1

## 2015-07-16 MED FILL — !NOVOLOG 100UNITS/ML VIAL: 100/ML | 33 days supply | Qty: 10 | Fill #1

## 2015-07-16 MED FILL — HYDROCHLOROTHIAZIDE 12.5 MG: 12.5 | 30 days supply | Qty: 30 | Fill #1

## 2015-07-16 MED FILL — ?PRAVASTATIN NA 40 MG TAB: 40 MG | 30 days supply | Qty: 30 | Fill #1

## 2015-07-16 MED FILL — ?PIOGLITAZONE HCL 30 MG TAB: 30 | 30 days supply | Qty: 15 | Fill #0

## 2015-07-31 ENCOUNTER — Other Ambulatory Visit: Payer: Self-pay | Admitting: Internal Medicine

## 2015-07-31 MED FILL — TRUEPLUS SYR 1ML 30GX5/16: 30G X 5/16" | 25 days supply | Qty: 100 | Fill #2

## 2015-07-31 MED FILL — GABAPENTIN 300 MG CAPSULE: 300 | 30 days supply | Qty: 90 | Fill #1

## 2015-07-31 MED FILL — MELOXICAM 15 MG TABLET: 15 | 30 days supply | Qty: 30 | Fill #0

## 2015-07-31 MED FILL — ?METFORMIN HCL 1,000 MG TAB: 1000 | 30 days supply | Qty: 60 | Fill #1

## 2015-08-09 MED FILL — ?HYDROCHLOROTHIAZIDE 12.5MG: 12.5 | 30 days supply | Qty: 30 | Fill #2

## 2015-08-09 MED FILL — !NOVOLOG 100UNITS/ML VIAL: 100/ML | 33 days supply | Qty: 10 | Fill #2

## 2015-08-20 MED FILL — PRAVASTATIN NA 40 MG TAB: 40 | 30 days supply | Qty: 30 | Fill #2

## 2015-08-20 MED FILL — ?PIOGLITAZONE HCL 30 MG TAB: 30 | 30 days supply | Qty: 15 | Fill #1

## 2015-08-28 ENCOUNTER — Other Ambulatory Visit: Payer: Self-pay | Admitting: Internal Medicine

## 2015-08-28 MED FILL — GABAPENTIN 300 MG CAPSULE: 300 | 30 days supply | Qty: 90 | Fill #2

## 2015-08-28 MED FILL — metFORMIN HCL 1000 MG TABS: 1000 | 30 days supply | Qty: 60 | Fill #2

## 2015-08-29 MED FILL — MELOXICAM 15 MG TABLET: 15 | 30 days supply | Qty: 30 | Fill #0

## 2015-08-30 ENCOUNTER — Other Ambulatory Visit: Payer: Self-pay | Admitting: Internal Medicine

## 2015-09-05 MED FILL — TRUEPLUS SYR 1ML 30GX5/16: 30G X 5/16" | 25 days supply | Qty: 100 | Fill #3

## 2015-09-05 MED FILL — !NOVOLOG 100UNITS/ML VIAL: 100/ML | 33 days supply | Qty: 10 | Fill #0

## 2015-09-21 MED FILL — ?HYDROCHLOROTHIAZIDE 12.5MG: 12.5 | 30 days supply | Qty: 30 | Fill #3

## 2015-09-21 MED FILL — ?PIOGLITAZONE HCL 30 MG TAB: 30 | 30 days supply | Qty: 15 | Fill #2

## 2015-09-21 MED FILL — ?PRAVASTATIN NA 40 MG TAB: 40 MG | 30 days supply | Qty: 30 | Fill #3

## 2015-10-01 ENCOUNTER — Other Ambulatory Visit: Payer: Self-pay | Admitting: Internal Medicine

## 2015-10-01 MED FILL — metFORMIN HCL 1000 MG TABS: 1000 | 30 days supply | Qty: 60 | Fill #3

## 2015-10-02 ENCOUNTER — Other Ambulatory Visit: Payer: Self-pay | Admitting: Internal Medicine

## 2015-10-03 MED FILL — MELOXICAM 15 MG TABLET: 15 | 30 days supply | Qty: 30 | Fill #0

## 2015-10-15 MED FILL — !NOVOLOG 100UNITS/ML VIAL: 100/ML | 33 days supply | Qty: 10 | Fill #1

## 2015-10-22 ENCOUNTER — Other Ambulatory Visit: Payer: Self-pay | Admitting: Internal Medicine

## 2015-10-22 DIAGNOSIS — I10 Essential (primary) hypertension: Secondary | ICD-10-CM

## 2015-10-22 DIAGNOSIS — E139 Other specified diabetes mellitus without complications: Secondary | ICD-10-CM

## 2015-10-22 MED FILL — ?HYDROCHLOROTHIAZIDE 12.5MG: 12.5 | 30 days supply | Qty: 30 | Fill #0

## 2015-10-22 MED FILL — ?PIOGLITAZONE HCL 30 MG TAB: 30 | 30 days supply | Qty: 15 | Fill #3

## 2015-10-22 MED FILL — ?PRAVASTATIN NA 40 MG TAB: 40 MG | 30 days supply | Qty: 30 | Fill #0

## 2015-10-22 MED FILL — GABAPENTIN 300 MG CAPSULE: 300 | 30 days supply | Qty: 90 | Fill #0

## 2015-11-06 ENCOUNTER — Other Ambulatory Visit: Payer: Self-pay | Admitting: Internal Medicine

## 2015-11-06 DIAGNOSIS — E139 Other specified diabetes mellitus without complications: Secondary | ICD-10-CM

## 2015-11-06 MED FILL — ?METFORMIN HCL 1,000 MG TAB: 1000 | 30 days supply | Qty: 60 | Fill #0

## 2015-11-08 ENCOUNTER — Other Ambulatory Visit: Payer: Self-pay | Admitting: Internal Medicine

## 2015-11-12 MED FILL — !NOVOLOG 100UNITS/ML VIAL: 100/ML | 33 days supply | Qty: 10 | Fill #2

## 2015-11-12 MED FILL — TRUEPLUS SYR 1ML 30GX5/16": 30G X 5/16" | 25 days supply | Qty: 100 | Fill #4

## 2015-11-12 MED FILL — TRUEPLUS SYR 1ML 30GX5/16: 30G X 5/16" | 25 days supply | Qty: 100 | Fill #4

## 2015-11-13 ENCOUNTER — Other Ambulatory Visit: Payer: Self-pay | Admitting: Internal Medicine

## 2015-11-16 MED FILL — ?PIOGLITAZONE HCL 30 MG TAB: 30 | 30 days supply | Qty: 15 | Fill #4

## 2015-11-19 ENCOUNTER — Other Ambulatory Visit: Payer: Self-pay | Admitting: Internal Medicine

## 2015-11-19 DIAGNOSIS — E139 Other specified diabetes mellitus without complications: Secondary | ICD-10-CM

## 2015-11-21 ENCOUNTER — Other Ambulatory Visit: Payer: Self-pay | Admitting: Internal Medicine

## 2015-11-21 DIAGNOSIS — E139 Other specified diabetes mellitus without complications: Secondary | ICD-10-CM

## 2015-11-27 ENCOUNTER — Other Ambulatory Visit: Payer: Self-pay | Admitting: Internal Medicine

## 2015-11-28 ENCOUNTER — Other Ambulatory Visit: Payer: Self-pay | Admitting: Internal Medicine

## 2015-11-28 MED FILL — GABAPENTIN 300 MG CAPSULE: 300 | 30 days supply | Qty: 90 | Fill #0

## 2015-12-03 ENCOUNTER — Ambulatory Visit: Payer: Self-pay | Attending: Internal Medicine | Admitting: Internal Medicine

## 2015-12-03 ENCOUNTER — Encounter: Payer: Self-pay | Admitting: Internal Medicine

## 2015-12-03 VITALS — BP 146/80 | HR 84 | Temp 99.0°F | Resp 16 | Wt 381.8 lb

## 2015-12-03 DIAGNOSIS — Z1159 Encounter for screening for other viral diseases: Secondary | ICD-10-CM

## 2015-12-03 DIAGNOSIS — Z23 Encounter for immunization: Secondary | ICD-10-CM | POA: Insufficient documentation

## 2015-12-03 DIAGNOSIS — E119 Type 2 diabetes mellitus without complications: Secondary | ICD-10-CM | POA: Insufficient documentation

## 2015-12-03 DIAGNOSIS — I10 Essential (primary) hypertension: Secondary | ICD-10-CM | POA: Insufficient documentation

## 2015-12-03 DIAGNOSIS — Z114 Encounter for screening for human immunodeficiency virus [HIV]: Secondary | ICD-10-CM | POA: Insufficient documentation

## 2015-12-03 DIAGNOSIS — E785 Hyperlipidemia, unspecified: Secondary | ICD-10-CM | POA: Insufficient documentation

## 2015-12-03 DIAGNOSIS — M4726 Other spondylosis with radiculopathy, lumbar region: Secondary | ICD-10-CM

## 2015-12-03 DIAGNOSIS — E1169 Type 2 diabetes mellitus with other specified complication: Secondary | ICD-10-CM

## 2015-12-03 DIAGNOSIS — Z6841 Body Mass Index (BMI) 40.0 and over, adult: Secondary | ICD-10-CM | POA: Insufficient documentation

## 2015-12-03 DIAGNOSIS — M4716 Other spondylosis with myelopathy, lumbar region: Secondary | ICD-10-CM | POA: Insufficient documentation

## 2015-12-03 DIAGNOSIS — E669 Obesity, unspecified: Secondary | ICD-10-CM

## 2015-12-03 LAB — CBC WITH DIFFERENTIAL/PLATELET
BASOS ABS: 74 {cells}/uL (ref 0–200)
Basophils Relative: 1 %
EOS ABS: 148 {cells}/uL (ref 15–500)
Eosinophils Relative: 2 %
HCT: 38.9 % (ref 38.5–50.0)
HEMOGLOBIN: 12.6 g/dL — AB (ref 13.2–17.1)
LYMPHS ABS: 2294 {cells}/uL (ref 850–3900)
Lymphocytes Relative: 31 %
MCH: 30.2 pg (ref 27.0–33.0)
MCHC: 32.4 g/dL (ref 32.0–36.0)
MCV: 93.3 fL (ref 80.0–100.0)
MONO ABS: 518 {cells}/uL (ref 200–950)
MPV: 10.9 fL (ref 7.5–12.5)
Monocytes Relative: 7 %
NEUTROS ABS: 4366 {cells}/uL (ref 1500–7800)
Neutrophils Relative %: 59 %
Platelets: 288 10*3/uL (ref 140–400)
RBC: 4.17 MIL/uL — ABNORMAL LOW (ref 4.20–5.80)
RDW: 13.1 % (ref 11.0–15.0)
WBC: 7.4 10*3/uL (ref 3.8–10.8)

## 2015-12-03 LAB — POCT GLYCOSYLATED HEMOGLOBIN (HGB A1C): HEMOGLOBIN A1C: 7.9

## 2015-12-03 LAB — GLUCOSE, POCT (MANUAL RESULT ENTRY): POC GLUCOSE: 130 mg/dL — AB (ref 70–99)

## 2015-12-03 MED ORDER — PRAVASTATIN SODIUM 40 MG PO TABS: 40.0000 mg | ORAL_TABLET | Freq: Every day | ORAL | 3 refills | Status: DC

## 2015-12-03 MED ORDER — GABAPENTIN 300 MG PO CAPS: 300.0000 mg | ORAL_CAPSULE | Freq: Three times a day (TID) | ORAL | 3 refills | Status: DC

## 2015-12-03 MED ORDER — PIOGLITAZONE HCL 30 MG PO TABS: 15.0000 mg | ORAL_TABLET | Freq: Every day | ORAL | 3 refills | Status: DC

## 2015-12-03 MED ORDER — METFORMIN HCL 1000 MG PO TABS: 1000.0000 mg | ORAL_TABLET | Freq: Two times a day (BID) | ORAL | 0 refills | Status: DC

## 2015-12-03 MED ORDER — MELOXICAM 15 MG PO TABS: 15.0000 mg | ORAL_TABLET | Freq: Every day | ORAL | 2 refills | Status: DC

## 2015-12-03 MED ORDER — HYDROCHLOROTHIAZIDE 25 MG PO TABS: 25.0000 mg | ORAL_TABLET | Freq: Every day | ORAL | 3 refills | Status: DC

## 2015-12-03 MED ORDER — ASPIRIN EC 81 MG PO TBEC: 81.0000 mg | DELAYED_RELEASE_TABLET | Freq: Every day | ORAL | 2 refills | Status: DC

## 2015-12-03 MED ORDER — INSULIN ASPART 100 UNIT/ML ~~LOC~~ SOLN: 10.0000 [IU] | Freq: Three times a day (TID) | SUBCUTANEOUS | 3 refills | Status: DC

## 2015-12-03 MED FILL — PRAVASTATIN NA 40 MG TAB: 40 | 30 days supply | Qty: 30 | Fill #0

## 2015-12-03 MED FILL — HYDROCHLOROTHIAZIDE 25 MG T: 25 | 30 days supply | Qty: 30 | Fill #0

## 2015-12-03 MED FILL — metFORMIN HCL 1000 MG TABS: 1000 | 30 days supply | Qty: 60 | Fill #0

## 2015-12-03 MED FILL — MELOXICAM 15 MG TABLET: 15 | 30 days supply | Qty: 30 | Fill #0

## 2015-12-03 NOTE — Progress Notes (Signed)
Dillon Mcgee, is a 55 y.o. male  JYN:[id number]:[id number]  [id number]  DOB - [birth year]  CC:  Chief Complaint  Patient presents with  . Establish Care       HPI: Dillon Mcgee is a 55 y.o. male here today to establish medical care, last seen in clinic 3/17, w/ hx of htn, dm, chronic left flank/extremity pain from prior injury to his tailbone he states, as well as morbid obesity.   Per pt, sometimes does not take am novolog b/c does not eat much carbs and walks a lot at work. Typically takes 10 units lunch and dinner. No issues taking metformin.  He ran out of mobic, which helped his pain greatly. But also c/o of leg/thigh pains/muscle tightness, improves w/ walking it out and stretching muscles sometimes, but asked for rx assitance.  Does not smoke or drink etoh.  Patient has No headache, No chest pain, No abdominal pain - No Nausea, No new weakness tingling or numbness, No Cough - SOB.    Review of Systems: Per HPI, o/w all systems reviewed and negative.  No Known Allergies Past Medical History:  Diagnosis Date  . Diabetes mellitus without complication (HCC)   . Hyperlipidemia   . Hypertension    Current Outpatient Prescriptions on File Prior to Visit  Medication Sig Dispense Refill  . TRUEPLUS INSULIN SYRINGE 30G X 5/16" 0.5 ML MISC USE TO INJECT INTO THE SKIN AS PRESCRIBED BY PHYSICIAN 100 each 12  . ferrous sulfate 325 (65 FE) MG tablet Take 325 mg by mouth daily with breakfast.    . glucose blood test strip Use as instructed 100 each 12  . hydrochlorothiazide (MICROZIDE) 12.5 MG capsule TAKE 1 CAPSULE BY MOUTH DAILY. 30 capsule 0  . HYDROcodone-acetaminophen (NORCO/VICODIN) 5-325 MG tablet Take 1-2 tablets by mouth every 6 (six) hours as needed for moderate pain. (Patient not taking: Reported on 12/03/2015) 10 tablet 0  . ibuprofen (ADVIL,MOTRIN) 600 MG tablet Take 1 tablet (600 mg total) by mouth every 8 (eight) hours as needed. 30 tablet 1  . Vitamin D,  Ergocalciferol, (DRISDOL) 50000 UNITS CAPS capsule Take 1 capsule (50,000 Units total) by mouth every 7 (seven) days. (Patient not taking: Reported on 12/03/2015) 12 capsule 0   No current facility-administered medications on file prior to visit.    Family History  Problem Relation Age of Onset  . Heart disease Maternal Aunt   . Diabetes Maternal Uncle    Social History   Social History  . Marital status: Single    Spouse name: N/A  . Number of children: N/A  . Years of education: N/A   Occupational History  . Not on file.   Social History Main Topics  . Smoking status: Never Smoker  . Smokeless tobacco: Not on file  . Alcohol use No  . Drug use: Unknown  . Sexual activity: Not on file   Other Topics Concern  . Not on file   Social History Narrative  . No narrative on file    Objective:   Vitals:   12/03/15 1621  BP: (!) 146/80  Pulse: 84  Resp: 16  Temp: 99 F (37.2 C)    Filed Weights   12/03/15 1621  Weight: (!) 381 lb 12.8 oz (173.2 kg)    BP Readings from Last 3 Encounters:  12/03/15 (!) 146/80  05/31/15 132/74  04/03/15 129/82   Pt was 386lbs 05/31/15 (lost 5 lbs since)  Physical Exam: Constitutional: Patient appears well-developed and well-nourished. No  distress. AAOx3, morbid obese HENT: Normocephalic, atraumatic, External right and left ear normal. Oropharynx is clear and moist.  Eyes: Conjunctivae and EOM are normal. PERRL, no scleral icterus. Neck: Normal ROM. Neck supple. No JVD.  CVS: RRR, S1/S2 +, no murmurs, no gallops, no carotid bruit.  Pulmonary: Effort and breath sounds normal, no stridor, rhonchi, wheezes, rales.  Abdominal: Soft. BS +, abd distension/obesity,  NO tenderness, rebound or guarding.  Musculoskeletal: Normal range of motion. No edema and no tenderness.   +muslce stiffness left lateral thigh region, mild ttp, rom intact LE: bilat/ no c/c/e, pulses 2+ bilateral. Neuro: Alert.  muscle tone coordination wnl. No cranial  nerve deficit grossly. Skin: Skin is warm and dry. No rash noted. Not diaphoretic. No erythema. No pallor. Psychiatric: Normal mood and affect. Behavior, judgment, thought content normal.  Lab Results  Component Value Date   WBC 7.3 02/20/2014   HGB 13.1 02/20/2014   HCT 41.1 02/20/2014   MCV 94.5 02/20/2014   PLT 314 02/20/2014   Lab Results  Component Value Date   CREATININE 0.87 06/07/2015   BUN 19 06/07/2015   NA 140 06/07/2015   K 4.9 06/07/2015   CL 103 06/07/2015   CO2 28 06/07/2015    Lab Results  Component Value Date   HGBA1C 7.9 12/03/2015   Lipid Panel     Component Value Date/Time   CHOL 169 06/07/2015 1005   TRIG 164 (H) 06/07/2015 1005   HDL 48 06/07/2015 1005   CHOLHDL 3.5 06/07/2015 1005   VLDL 33 (H) 06/07/2015 1005   LDLCALC 88 06/07/2015 1005       Depression screen [facility] 2/9 05/31/2015 09/25/2014 09/25/2014  Decreased Interest 0 0 0  Down, Depressed, Hopeless 0 0 0  PHQ - 2 Score 0 0 0    Assessment and plan:   1. Essential hypertension Not at goal for dm, increase to hctz 25 qd Dash diet disccused - CBC with Differential - BASIC METABOLIC PANEL WITH GFR  2. Osteoarthritis of spine with radiculopathy, lumbar region Flexeril rx, prn Renewed mobic  3. Hyperlipidemia, unspecified hyperlipidemia type Renewed statin  4. Diabetes mellitus type 2 in obese (HCC) Continue metformin and novolog same dosing, but cautioned pt that if does strict low carb diet for weight loss, than his insulin /metformin needs may decrease as well, and to watch it closely - renewed pioglitazone for now, consider stopping next appt. - Glucose (CBG) - HgB A1c 7.9 - pneumococcal 23v today  5. Need for hepatitis C screening test - Hepatitis C antibody  6. Encounter for screening for HIV - HIV antibody (with reflex)  7. Morbid obesity Lengthy discussion w/ pt today, recd LCHF/IF to patient, info provided, cautioned him that if does this, will notice weight loss,  but also may need to reduce insulin/dm rx as well due to it. Recd <50gm/carbs day if able.  Return in about 4 weeks (around 12/31/2015) for DM /OBESITY.  The patient was given clear instructions to go to ER or return to medical center if symptoms don't improve, worsen or new problems develop. The patient verbalized understanding. The patient was told to call to get lab results if they haven't heard anything in the next week.    This note has been created with Education officer, environmental. Any transcriptional errors are unintentional.   Pete Glatter, MD, MBA/MHA [facility] [place], [place] [phone]   12/03/2015, 5:08 PM

## 2015-12-03 NOTE — Patient Instructions (Signed)
RECOMMEND LIMIT TO <50 GM /DAY CARBOHYDRATES. WATCH YOUR SUGARS!!  QUICK START PATIENT GUIDE TO LCHF/IF LOW CARB HIGH FAT / INTERMITTENT FASTING What is this diet and how does it work? o Insulin is a hormone made by your body that allows you to use sugar (glucose) from carbohydrates in the food you eat for energy or to store glucose (as fat) for future use  o Insulin levels need to be lowered in order to utilize our stored energy (fat) o Many struggling with obesity are insulin resistant and have high levels of insulin o This diet works to lower your insulin in two ways o Fasting - allows your insulin levels to naturally decrease  o Avoiding carbohydrates - carbs trigger increase in insulin Low Carb Healthy Fat (LCHF) o Get a free app for your phone, such as MyFitnessPal, to help you track your macronutrients (carbs/protein/fats) and to track your weight and body measurements to see your progress o Set your goal for around 10% carbs/20% protein/70% fat o A good starting goal for amount of net carbs per day is 50 grams (some will aim for 20 grams) o "Net carbs" refers to total grams of carbs minus grams of fiber (as fiber is not typically absorbed). For example, if a food has 5g total carb and 3g fiber, that would be 2g net carbs o Increase healthy fats - eg. olive oil, eggs, nuts, avocado, cheese, butter, coconut, meats, fish o Avoid high carb foods - eg. bread, pasta, potatoes, rice, cookies, soda, juice, anything sugary o Buy full-fat ingredients (avoid low-fat versions, which often have more sugar) o No need to count calories, but pay close attention to grams of carbs on labels Intermittent Fasting (IF) o "Fasting" is going a period of time without eating - it helps to stay busy and well-hydrated o Purpose of fasting is to allow insulin levels to drop as low as possible, allowing your body to switch into fat-burning mode o With this diet there are many approaches to fasting, but 16:8 and  24hr fasts are commonly used o 16:8 fast, usually 5-7 days a week - Fasting for 16 hours of the day, then eating all meals for the day over course of 8 hours. o 24 hour fast, usually 1-3 days a week - Typically eating one meal a day, then fasting until the next day. Plenty of fluids (and some salt to help you hold onto fluids) are recommended during longer fasts.  o During fasts certain beverages are still acceptable - water, sparkling water, bone broth, black tea or coffee, or tea/coffee with small amount of heavy whipping cream Special note for those on diabetic medications o Discuss your medications with your physician. You may need to hold your medication or adjust to only taking when eating. Diabetics should keep close track of their blood sugars when making any changes to diet/meds, to ensure they are staying within normal limits For more info about LCHF/IF o Watch "Therapeutic Fasting - Solving the Two-Compartment Problem" video by Dr. Wylene SimmerJason Fung on YouTube (SatelliteSeeker.nohttps://www.youtube.com/watch?v=tIuj-oMN-Fk) for a great intro to these concepts o Read "The Obesity Code" and/or "The Complete Guide to Fasting" by Dr. Wylene SimmerJason Fung o Go to www.dietdoctor.com for explanations, recipes, and infographics about foods to eat/avoid EXAMPLES TO GET STARTED Fasting Beverages -water (can add  tsp Pink Himalayan salt once or twice a day to help stay hydrated for longer fasts) -Sparkling water (such as [employer] or similar; avoid any with artificial sweeteners)  -Bone broth (multiple  recipes available online or can buy pre-made) -Tea or Coffee (Adding heavy whipping cream or coconut oil to your tea or coffee can be helpful if you find yourself getting too hungry during the fasts. Can also add cinnamon for flavor. Or "bulletproof coffee.") Low Carb Healthy Fat Breakfast (if not fasting) -eggs in butter or olive oil with avocado -omelet with veggies and cheese  Lunch -hamburger with cheese and avocado wrapped in  lettuce (no bun, no ketchup) -meat and cheese wrapped in lettuce (can dip in mustard or olive oil/vinegar/mayo) -salad with meat/cheese/nuts and higher fat dressing (vinaigrette or Ranch, etc) -tuna salad lettuce wrap -taco meat with cheese, sour cream, guacamole, cheese over lettuce  Dinner -steak with herb butter or Barnaise sauce -"Fathead" pizza (uses cheese and almond flour for the dough - several recipes available online) -roasted or grilled chicken with skin on, with low carb sauce (buffalo, garlic butter, alfredo, pesto, etc) -baked salmon with lemon butter -chicken alfredo with zucchini noodles -[place] butter chicken with low carb garlic naan -egg roll in a bowl  Side Dishes -mashed cauliflower (homemade or available in freezer section) -roast vegetables (green veggies that grow above ground rather than root veggies) with butter or cheese -Caprese salad (fresh mozzarella, tomato and basil with olive oil) -homemade low-carb coleslaw Snacks/Desserts (try to avoid unnecessary snacking and sweets in general) -celery or cucumber dipped in guacamole or sour cream dip -cheese and meat slices  -raspberries with whipped cream (can make homemade with no sugar added) -low carb [place] butter cake  AVOID - sugar, diet/regular soda, potatoes, breads, rice, pasta, candy, cookies, cakes, muffins, juice, high carb fruit (bananas, grapes), beer, ketchup, barbeque and other sweet sauces

## 2015-12-04 LAB — BASIC METABOLIC PANEL WITH GFR
BUN: 18 mg/dL (ref 7–25)
CHLORIDE: 105 mmol/L (ref 98–110)
CO2: 26 mmol/L (ref 20–31)
CREATININE: 0.85 mg/dL (ref 0.70–1.33)
Calcium: 9.6 mg/dL (ref 8.6–10.3)
GFR, Est African American: >89 mL/min (ref >=60)
Glucose, Bld: 95 mg/dL (ref 65–99)
POTASSIUM: 4 mmol/L (ref 3.5–5.3)
SODIUM: 142 mmol/L (ref 135–146)

## 2015-12-04 LAB — HEPATITIS C ANTIBODY: HCV AB: NEGATIVE

## 2015-12-04 LAB — HIV ANTIBODY (ROUTINE TESTING W REFLEX): HIV: NONREACTIVE

## 2015-12-05 ENCOUNTER — Telehealth: Payer: Self-pay

## 2015-12-05 ENCOUNTER — Telehealth: Payer: Self-pay | Admitting: Internal Medicine

## 2015-12-05 NOTE — Telephone Encounter (Signed)
Patient returned the nurses phone call. Patient is best reached after 3pm.

## 2015-12-05 NOTE — Telephone Encounter (Signed)
Contacted pt to go over lab results pt didn't answer lvm for pt to give me a call at his earliest convenience  

## 2015-12-11 ENCOUNTER — Other Ambulatory Visit: Payer: Self-pay | Admitting: Internal Medicine

## 2015-12-11 MED FILL — TRUEPLUS SYR 1ML 30GX5/16: 30G X 5/16" | 25 days supply | Qty: 100 | Fill #5

## 2015-12-11 MED FILL — TRUEPLUS SYR 1ML 30GX5/16": 30G X 5/16" | 25 days supply | Qty: 100 | Fill #5

## 2015-12-12 MED FILL — !NOVOLOG 100UNITS/ML VIAL: 100/ML | 33 days supply | Qty: 10 | Fill #0

## 2015-12-24 MED FILL — PIOGLITAZONE HCL 30 MG TAB: 30 | 30 days supply | Qty: 15 | Fill #5

## 2015-12-28 ENCOUNTER — Other Ambulatory Visit: Payer: Self-pay | Admitting: Internal Medicine

## 2015-12-28 MED FILL — HYDROCHLOROTHIAZIDE 25 MG T: 25 | 30 days supply | Qty: 30 | Fill #1

## 2015-12-28 MED FILL — ?PRAVASTATIN NA 40 MG TAB: 40 MG | 30 days supply | Qty: 30 | Fill #1

## 2016-01-01 ENCOUNTER — Encounter: Payer: Self-pay | Admitting: Internal Medicine

## 2016-01-01 ENCOUNTER — Ambulatory Visit: Payer: Self-pay | Attending: Internal Medicine | Admitting: Internal Medicine

## 2016-01-01 VITALS — BP 130/80 | HR 79 | Temp 97.7°F | Resp 16 | Wt 374.2 lb

## 2016-01-01 DIAGNOSIS — E1165 Type 2 diabetes mellitus with hyperglycemia: Secondary | ICD-10-CM

## 2016-01-01 DIAGNOSIS — E785 Hyperlipidemia, unspecified: Secondary | ICD-10-CM | POA: Insufficient documentation

## 2016-01-01 DIAGNOSIS — Z1211 Encounter for screening for malignant neoplasm of colon: Secondary | ICD-10-CM

## 2016-01-01 DIAGNOSIS — Z794 Long term (current) use of insulin: Secondary | ICD-10-CM | POA: Insufficient documentation

## 2016-01-01 DIAGNOSIS — Z79899 Other long term (current) drug therapy: Secondary | ICD-10-CM | POA: Insufficient documentation

## 2016-01-01 DIAGNOSIS — I10 Essential (primary) hypertension: Secondary | ICD-10-CM | POA: Insufficient documentation

## 2016-01-01 DIAGNOSIS — E119 Type 2 diabetes mellitus without complications: Secondary | ICD-10-CM | POA: Insufficient documentation

## 2016-01-01 DIAGNOSIS — Z23 Encounter for immunization: Secondary | ICD-10-CM

## 2016-01-01 DIAGNOSIS — Z7982 Long term (current) use of aspirin: Secondary | ICD-10-CM | POA: Insufficient documentation

## 2016-01-01 DIAGNOSIS — Z7984 Long term (current) use of oral hypoglycemic drugs: Secondary | ICD-10-CM | POA: Insufficient documentation

## 2016-01-01 LAB — GLUCOSE, POCT (MANUAL RESULT ENTRY): POC Glucose: 85 mg/dl (ref 70–99)

## 2016-01-01 MED ORDER — GABAPENTIN 300 MG PO CAPS: 300.0000 mg | ORAL_CAPSULE | Freq: Three times a day (TID) | ORAL | 3 refills | Status: DC

## 2016-01-01 MED ORDER — TRIAMCINOLONE ACETONIDE 0.1 % EX CREA: 1.0000 "application " | TOPICAL_CREAM | Freq: Two times a day (BID) | CUTANEOUS | 2 refills | Status: DC

## 2016-01-01 MED ORDER — METFORMIN HCL 500 MG PO TABS: 500.0000 mg | ORAL_TABLET | Freq: Two times a day (BID) | ORAL | 2 refills | Status: DC

## 2016-01-01 MED FILL — ?METFORMIN HCL 500MG TABLET: 500 | 30 days supply | Qty: 60 | Fill #0

## 2016-01-01 MED FILL — TRIAMCINOLONE 0.1% CREAM: 0.1 | 20 days supply | Qty: 30 | Fill #0

## 2016-01-01 MED FILL — GABAPENTIN 300 MG CAPSULE: 300 | 30 days supply | Qty: 90 | Fill #0

## 2016-01-01 NOTE — Progress Notes (Addendum)
Dillon CompCharles Poffenberger, is a 55 y.o. male  ZOX:[id number]  [id number]  DOB - Jan 12, 1961  Chief Complaint  Patient presents with  . Diabetes  . Obesity        Subjective:   Dillon Mcgee is a 55 y.o. male here today for a follow up visit for dm, obesity and htn. Pt taking all his meds, now on low carb diet, watching what he eats much more closely. He has lost about 7 lbs since beginning of month. Largest meal is breakfast, typically skips am insulin b/c gets jittery by atou 9am b/c burning off the calories due to lots of walking. Sometimes has to eat candy bar to help with. Usually forgets to chk his sugar though.  He is eating smaller meals, smaller snacks, lots of vegs/salads.   Urinating frequently since started hctz.   Patient has No headache, No chest pain, No abdominal pain - No Nausea, No new weakness tingling or numbness, No Cough - SOB.  No problems updated.  ALLERGIES: No Known Allergies  PAST MEDICAL HISTORY: Past Medical History:  Diagnosis Date  . Diabetes mellitus without complication (HCC)   . Hyperlipidemia   . Hypertension     MEDICATIONS AT HOME: Prior to Admission medications   Medication Sig Start Date End Date Taking? Authorizing Provider  aspirin EC 81 MG tablet Take 1 tablet (81 mg total) by mouth daily. 12/03/15   Pete Glatterawn T Mykelti Goldenstein, MD  ferrous sulfate 325 (65 FE) MG tablet Take 325 mg by mouth daily with breakfast.    Historical Provider, MD  gabapentin (NEURONTIN) 300 MG capsule Take 1 capsule (300 mg total) by mouth 3 (three) times daily. 01/01/16   Pete Glatterawn T Lexiana Spindel, MD  glucose blood test strip Use as instructed 02/23/14   Doris Cheadleeepak Advani, MD  hydrochlorothiazide (HYDRODIURIL) 25 MG tablet Take 1 tablet (25 mg total) by mouth daily. 12/03/15   Pete Glatterawn T Lonzy Mato, MD  hydrochlorothiazide (MICROZIDE) 12.5 MG capsule TAKE 1 CAPSULE BY MOUTH DAILY. 10/22/15   Quentin Angstlugbemiga E Jegede, MD  HYDROcodone-acetaminophen (NORCO/VICODIN) 5-325 MG tablet Take 1-2  tablets by mouth every 6 (six) hours as needed for moderate pain. Patient not taking: Reported on 01/01/2016 04/03/15   Vanetta MuldersScott Zackowski, MD  ibuprofen (ADVIL,MOTRIN) 600 MG tablet Take 1 tablet (600 mg total) by mouth every 8 (eight) hours as needed. 05/31/14   Doris Cheadleeepak Advani, MD  insulin aspart (NOVOLOG) 100 UNIT/ML injection Inject 10 Units into the skin 3 (three) times daily with meals. MAY NEED TO REDUCE PENDING CARB LIMITING DIET 12/03/15   Pete Glatterawn T Endre Coutts, MD  meloxicam (MOBIC) 15 MG tablet Take 1 tablet (15 mg total) by mouth daily. 12/03/15   Pete Glatterawn T Joanna Hall, MD  metFORMIN (GLUCOPHAGE) 500 MG tablet Take 1 tablet (500 mg total) by mouth 2 (two) times daily with a meal. 01/01/16   Pete Glatterawn T Sipriano Fendley, MD  pioglitazone (ACTOS) 30 MG tablet Take 0.5 tablets (15 mg total) by mouth daily. 12/03/15   Pete Glatterawn T Shakina Choy, MD  pravastatin (PRAVACHOL) 40 MG tablet Take 1 tablet (40 mg total) by mouth daily. 12/03/15   Pete Glatterawn T Waris Rodger, MD  triamcinolone cream (KENALOG) 0.1 % Apply 1 application topically 2 (two) times daily. 01/01/16   Pete Glatterawn T Nayef College, MD  TRUEPLUS INSULIN SYRINGE 30G X 5/16" 0.5 ML MISC USE TO INJECT INTO THE SKIN AS PRESCRIBED BY PHYSICIAN 03/13/15   Ambrose FinlandValerie A Keck, NP  Vitamin D, Ergocalciferol, (DRISDOL) 50000 UNITS CAPS capsule Take 1 capsule (50,000 Units total) by mouth  every 7 (seven) days. Patient not taking: Reported on 01/01/2016 02/23/14   Doris Cheadleeepak Advani, MD     Objective:   Vitals:   01/01/16 1607  BP: 130/80  Pulse: 79  Resp: 16  Temp: 97.7 F (36.5 C)  TempSrc: Oral  SpO2: 93%  Weight: (!) 374 lb 3.2 oz (169.7 kg)    Exam General appearance : Awake, alert, not in any distress. Speech Clear. Not toxic looking HEENT: Atraumatic and Normocephalic, pupils equally reactive to light. Neck: supple, no JVD. No cervical lymphadenopathy.  Chest:Good air entry bilaterally, no added sounds. CVS: S1 S2 regular, no murmurs/gallups or rubs. Abdomen: Bowel sounds active, Non  tender and not distended with no gaurding, rigidity or rebound. Extremities: B/L Lower Ext shows no edema, both legs are warm to touch Neurology: Awake alert, and oriented X 3, CN II-XII grossly intact, Non focal Skin:No Rash  Data Review Lab Results  Component Value Date   HGBA1C 7.9 12/03/2015   HGBA1C 9.1 05/31/2015   HGBA1C 7.2 10/25/2014    Depression screen PHQ 2/9 01/01/2016 12/03/2015 05/31/2015 09/25/2014 09/25/2014  Decreased Interest 0 0 0 0 0  Down, Depressed, Hopeless 0 0 0 0 0  PHQ - 2 Score 0 0 0 0 0      Assessment & Plan   1. Essential hypertension Controlled on hctz, dash diet discussed  2. Type 2 diabetes mellitus with hyperglycemia, with long-term current use of insulin (HCC) Suspect getting better controlled since on low carb diet now. - back off on metformin to 500bid given hypoglycemia - Glucose (CBG)  3. Obesity, morbid (HCC) - continue exericise/low carb diet and weight loss! Doing great, lost 7 lbs since I saw at beginning of month  4. tdap today  5. amb ref to gi and eye doctor    Patient have been counseled extensively about nutrition and exercise  Return in about 3 months (around 04/02/2016).  The patient was given clear instructions to go to ER or return to medical center if symptoms don't improve, worsen or new problems develop. The patient verbalized understanding. The patient was told to call to get lab results if they haven't heard anything in the next week.   This note has been created with Education officer, environmentalDragon speech recognition software and smart phrase technology. Any transcriptional errors are unintentional.   Pete Glatterawn T Zakari Couchman, MD, MBA/MHA [facility] [place], [place] [phone]   01/01/2016, 4:27 PM

## 2016-01-01 NOTE — Patient Instructions (Addendum)
Tdap Vaccine (Tetanus, Diphtheria and Pertussis): What You Need to Know 1. Why get vaccinated? Tetanus, diphtheria and pertussis are very serious diseases. Tdap vaccine can protect us from these diseases. And, Tdap vaccine given to pregnant women can protect newborn babies against pertussis. TETANUS (Lockjaw) is rare in the [place] today. It causes painful muscle tightening and stiffness, usually all over the body.  It can lead to tightening of muscles in the head and neck so you can't open your mouth, swallow, or sometimes even breathe. Tetanus kills about 1 out of 10 people who are infected even after receiving the best medical care. DIPHTHERIA is also rare in the [place] today. It can cause a thick coating to form in the back of the throat.  It can lead to breathing problems, heart failure, paralysis, and death. PERTUSSIS (Whooping Cough) causes severe coughing spells, which can cause difficulty breathing, vomiting and disturbed sleep.  It can also lead to weight loss, incontinence, and rib fractures. Up to 2 in 100 adolescents and 5 in 100 adults with pertussis are hospitalized or have complications, which could include pneumonia or death. These diseases are caused by bacteria. Diphtheria and pertussis are spread from person to person through secretions from coughing or sneezing. Tetanus enters the body through cuts, scratches, or wounds. Before vaccines, as many as 200,000 cases of diphtheria, 200,000 cases of pertussis, and hundreds of cases of tetanus, were reported in the [place] each year. Since vaccination began, reports of cases for tetanus and diphtheria have dropped by about 99% and for pertussis by about 80%. 2. Tdap vaccine Tdap vaccine can protect adolescents and adults from tetanus, diphtheria, and pertussis. One dose of Tdap is routinely given at age 11 or 12. People who did not get Tdap at that age should get it as soon as possible. Tdap is especially important  for healthcare professionals and anyone having close contact with a baby younger than 12 months. Pregnant women should get a dose of Tdap during every pregnancy, to protect the newborn from pertussis. Infants are most at risk for severe, life-threatening complications from pertussis. Another vaccine, called Td, protects against tetanus and diphtheria, but not pertussis. A Td booster should be given every 10 years. Tdap may be given as one of these boosters if you have never gotten Tdap before. Tdap may also be given after a severe cut or burn to prevent tetanus infection. Your doctor or the person giving you the vaccine can give you more information. Tdap may safely be given at the same time as other vaccines. 3. Some people should not get this vaccine  A person who has ever had a life-threatening allergic reaction after a previous dose of any diphtheria, tetanus or pertussis containing vaccine, OR has a severe allergy to any part of this vaccine, should not get Tdap vaccine. Tell the person giving the vaccine about any severe allergies.  Anyone who had coma or long repeated seizures within 7 days after a childhood dose of DTP or DTaP, or a previous dose of Tdap, should not get Tdap, unless a cause other than the vaccine was found. They can still get Td.  Talk to your doctor if you:  have seizures or another nervous system problem,  had severe pain or swelling after any vaccine containing diphtheria, tetanus or pertussis,  ever had a condition called Guillain-Barr Syndrome (GBS),  aren't feeling well on the day the shot is scheduled. 4. Risks With any medicine, including vaccines, there is   a chance of side effects. These are usually mild and go away on their own. Serious reactions are also possible but are rare. Most people who get Tdap vaccine do not have any problems with it. Mild problems following Tdap (Did not interfere with activities)  Pain where the shot was given (about 3 in 4  adolescents or 2 in 3 adults)  Redness or swelling where the shot was given (about 1 person in 5)  Mild fever of at least 100.4F (up to about 1 in 25 adolescents or 1 in 100 adults)  Headache (about 3 or 4 people in 10)  Tiredness (about 1 person in 3 or 4)  Nausea, vomiting, diarrhea, stomach ache (up to 1 in 4 adolescents or 1 in 10 adults)  Chills, sore joints (about 1 person in 10)  Body aches (about 1 person in 3 or 4)  Rash, swollen glands (uncommon) Moderate problems following Tdap (Interfered with activities, but did not require medical attention)  Pain where the shot was given (up to 1 in 5 or 6)  Redness or swelling where the shot was given (up to about 1 in 16 adolescents or 1 in 12 adults)  Fever over 102F (about 1 in 100 adolescents or 1 in 250 adults)  Headache (about 1 in 7 adolescents or 1 in 10 adults)  Nausea, vomiting, diarrhea, stomach ache (up to 1 or 3 people in 100)  Swelling of the entire arm where the shot was given (up to about 1 in 500). Severe problems following Tdap (Unable to perform usual activities; required medical attention)  Swelling, severe pain, bleeding and redness in the arm where the shot was given (rare). Problems that could happen after any vaccine:  People sometimes faint after a medical procedure, including vaccination. Sitting or lying down for about 15 minutes can help prevent fainting, and injuries caused by a fall. Tell your doctor if you feel dizzy, or have vision changes or ringing in the ears.  Some people get severe pain in the shoulder and have difficulty moving the arm where a shot was given. This happens very rarely.  Any medication can cause a severe allergic reaction. Such reactions from a vaccine are very rare, estimated at fewer than 1 in a million doses, and would happen within a few minutes to a few hours after the vaccination. As with any medicine, there is a very remote chance of a vaccine causing a serious  injury or death. The safety of vaccines is always being monitored. For more information, visit: www.cdc.gov/vaccinesafety/ 5. What if there is a serious problem? What should I look for?  Look for anything that concerns you, such as signs of a severe allergic reaction, very high fever, or unusual behavior.  Signs of a severe allergic reaction can include hives, swelling of the face and throat, difficulty breathing, a fast heartbeat, dizziness, and weakness. These would usually start a few minutes to a few hours after the vaccination. What should I do?  If you think it is a severe allergic reaction or other emergency that can't wait, call 9-1-1 or get the person to the nearest hospital. Otherwise, call your doctor.  Afterward, the reaction should be reported to the Vaccine Adverse Event Reporting System (VAERS). Your doctor might file this report, or you can do it yourself through the VAERS web site at www.vaers.hhs.gov, or by calling 1-800-822-7967. VAERS does not give medical advice.  6. The National Vaccine Injury Compensation Program The National Vaccine Injury Compensation Program (  VICP) is a federal program that was created to compensate people who may have been injured by certain vaccines. Persons who believe they may have been injured by a vaccine can learn about the program and about filing a claim by calling 1-770-208-7099 or visiting the VICP website at SpiritualWord.atwww.hrsa.gov/vaccinecompensation. There is a time limit to file a claim for compensation. 7. How can I learn more?  Ask your doctor. He or she can give you the vaccine package insert or suggest other sources of information.  Call your local or state health department.  Contact the Centers for Disease Control and Prevention (CDC):  Call 860-358-44631-361-856-4689 (1-800-CDC-INFO) or  Visit CDC's website at PicCapture.uywww.cdc.gov/vaccines CDC Tdap Vaccine VIS (04/26/13)   This information is not intended to replace advice given to you by your health care  provider. Make sure you discuss any questions you have with your health care provider.   Document Released: 08/19/2011 Document Revised: 03/10/2014 Document Reviewed: 06/01/2013 Elsevier Interactive Patient Education 2016 Elsevier Inc.   - QUICK START PATIENT GUIDE TO LCHF/IF LOW CARB HIGH FAT / INTERMITTENT FASTING  Recommend: <50 gram carbohydrate a day for weightloss.  What is this diet and how does it work? o Insulin is a hormone made by your body that allows you to use sugar (glucose) from carbohydrates in the food you eat for energy or to store glucose (as fat) for future use  o Insulin levels need to be lowered in order to utilize our stored energy (fat) o Many struggling with obesity are insulin resistant and have high levels of insulin o This diet works to lower your insulin in two ways o Fasting - allows your insulin levels to naturally decrease  o Avoiding carbohydrates - carbs trigger increase in insulin Low Carb Healthy Fat (LCHF) o Get a free app for your phone, such as MyFitnessPal, to help you track your macronutrients (carbs/protein/fats) and to track your weight and body measurements to see your progress o Set your goal for around 10% carbs/20% protein/70% fat o A good starting goal for amount of net carbs per day is 50 grams (some will aim for 20 grams) o "Net carbs" refers to total grams of carbs minus grams of fiber (as fiber is not typically absorbed). For example, if a food has 5g total carb and 3g fiber, that would be 2g net carbs o Increase healthy fats - eg. olive oil, eggs, nuts, avocado, cheese, butter, coconut, meats, fish o Avoid high carb foods - eg. bread, pasta, potatoes, rice, cookies, soda, juice, anything sugary o Buy full-fat ingredients (avoid low-fat versions, which often have more sugar) o No need to count calories, but pay close attention to grams of carbs on labels Intermittent Fasting (IF) o "Fasting" is going a period of time without eating -  it helps to stay busy and well-hydrated o Purpose of fasting is to allow insulin levels to drop as low as possible, allowing your body to switch into fat-burning mode o With this diet there are many approaches to fasting, but 16:8 and 24hr fasts are commonly used o 16:8 fast, usually 5-7 days a week - Fasting for 16 hours of the day, then eating all meals for the day over course of 8 hours. o 24 hour fast, usually 1-3 days a week - Typically eating one meal a day, then fasting until the next day. Plenty of fluids (and some salt to help you hold onto fluids) are recommended during longer fasts.  o During fasts certain beverages  are still acceptable - water, sparkling water, bone broth, black tea or coffee, or tea/coffee with small amount of heavy whipping cream Special note for those on diabetic medications o Discuss your medications with your physician. You may need to hold your medication or adjust to only taking when eating. Diabetics should keep close track of their blood sugars when making any changes to diet/meds, to ensure they are staying within normal limits For more info about LCHF/IF o Watch "Therapeutic Fasting - Solving the Two-Compartment Problem" video by Dr. Wylene SimmerJason Fung on YouTube (SatelliteSeeker.nohttps://www.youtube.com/watch?v=tIuj-oMN-Fk) for a great intro to these concepts o Read "The Obesity Code" and/or "The Complete Guide to Fasting" by Dr. Wylene SimmerJason Fung o Go to www.dietdoctor.com for explanations, recipes, and infographics about foods to eat/avoid o Get a Free smartphone app that helps count carbohydrates  - ie MyKeto EXAMPLES TO GET STARTED Fasting Beverages -water (can add  tsp Pink Himalayan salt once or twice a day to help stay hydrated for longer fasts) -Sparkling water (such as [employer] or similar; avoid any with artificial sweeteners)  -Bone broth (multiple recipes available online or can buy pre-made) -Tea or Coffee (Adding heavy whipping cream or coconut oil to your tea or coffee can  be helpful if you find yourself getting too hungry during the fasts. Can also add cinnamon for flavor. Or "bulletproof coffee.") Low Carb Healthy Fat Breakfast (if not fasting) -eggs in butter or olive oil with avocado -omelet with veggies and cheese  Lunch -hamburger with cheese and avocado wrapped in lettuce (no bun, no ketchup) -meat and cheese wrapped in lettuce (can dip in mustard or olive oil/vinegar/mayo) -salad with meat/cheese/nuts and higher fat dressing (vinaigrette or Ranch, etc) -tuna salad lettuce wrap -taco meat with cheese, sour cream, guacamole, cheese over lettuce  Dinner -steak with herb butter or Barnaise sauce -"Fathead" pizza (uses cheese and almond flour for the dough - several recipes available online) -roasted or grilled chicken with skin on, with low carb sauce (buffalo, garlic butter, alfredo, pesto, etc) -baked salmon with lemon butter -chicken alfredo with zucchini noodles -[place] butter chicken with low carb garlic naan -egg roll in a bowl  Side Dishes -mashed cauliflower (homemade or available in freezer section) -roast vegetables (green veggies that grow above ground rather than root veggies) with butter or cheese -Caprese salad (fresh mozzarella, tomato and basil with olive oil) -homemade low-carb coleslaw Snacks/Desserts (try to avoid unnecessary snacking and sweets in general) -celery or cucumber dipped in guacamole or sour cream dip -cheese and meat slices  -raspberries with whipped cream (can make homemade with no sugar added) -low carb [place] butter cake  AVOID - sugar, diet/regular soda, potatoes, breads, rice, pasta, candy, cookies, cakes, muffins, juice, high carb fruit (bananas, grapes), beer, ketchup, barbeque and other sweet sauces  -  Diabetes and Exercise Exercising regularly is important. It is not just about losing weight. It has many health benefits, such as:  Improving your overall fitness, flexibility, and  endurance.  Increasing your bone density.  Helping with weight control.  Decreasing your body fat.  Increasing your muscle strength.  Reducing stress and tension.  Improving your overall health. People with diabetes who exercise gain additional benefits because exercise:  Reduces appetite.  Improves the body's use of blood sugar (glucose).  Helps lower or control blood glucose.  Decreases blood pressure.  Helps control blood lipids (such as cholesterol and triglycerides).  Improves the body's use of the hormone insulin by:  Increasing the body's insulin sensitivity.  Reducing the body's insulin needs.  Decreases the risk for heart disease because exercising:  Lowers cholesterol and triglycerides levels.  Increases the levels of good cholesterol (such as high-density lipoproteins [HDL]) in the body.  Lowers blood glucose levels. YOUR ACTIVITY PLAN  Choose an activity that you enjoy, and set realistic goals. To exercise safely, you should begin practicing any new physical activity slowly, and gradually increase the intensity of the exercise over time. Your health care provider or diabetes educator can help create an activity plan that works for you. General recommendations include:  Encouraging children to engage in at least 60 minutes of physical activity each day.  Stretching and performing strength training exercises, such as yoga or weight lifting, at least 2 times per week.  Performing a total of at least 150 minutes of moderate-intensity exercise each week, such as brisk walking or water aerobics.  Exercising at least 3 days per week, making sure you allow no more than 2 consecutive days to pass without exercising.  Avoiding long periods of inactivity (90 minutes or more). When you have to spend an extended period of time sitting down, take frequent breaks to walk or stretch. RECOMMENDATIONS FOR EXERCISING WITH TYPE 1 OR TYPE 2 DIABETES   Check your blood  glucose before exercising. If blood glucose levels are greater than 240 mg/dL, check for urine ketones. Do not exercise if ketones are present.  Avoid injecting insulin into areas of the body that are going to be exercised. For example, avoid injecting insulin into:  The arms when playing tennis.  The legs when jogging.  Keep a record of:  Food intake before and after you exercise.  Expected peak times of insulin action.  Blood glucose levels before and after you exercise.  The type and amount of exercise you have done.  Review your records with your health care provider. Your health care provider will help you to develop guidelines for adjusting food intake and insulin amounts before and after exercising.  If you take insulin or oral hypoglycemic agents, watch for signs and symptoms of hypoglycemia. They include:  Dizziness.  Shaking.  Sweating.  Chills.  Confusion.  Drink plenty of water while you exercise to prevent dehydration or heat stroke. Body water is lost during exercise and must be replaced.  Talk to your health care provider before starting an exercise program to make sure it is safe for you. Remember, almost any type of activity is better than none.   This information is not intended to replace advice given to you by your health care provider. Make sure you discuss any questions you have with your health care provider.   Document Released: 05/10/2003 Document Revised: 07/04/2014 Document Reviewed: 07/27/2012 Elsevier Interactive Patient Education 2016 [employer].   -

## 2016-01-14 MED FILL — MELOXICAM 15 MG TABLET: 15 | 30 days supply | Qty: 30 | Fill #1

## 2016-01-15 MED FILL — !NOVOLOG 100UNITS/ML VIAL: 100/ML | 33 days supply | Qty: 10 | Fill #1

## 2016-02-01 ENCOUNTER — Encounter: Payer: Self-pay | Admitting: Internal Medicine

## 2016-02-08 MED FILL — MELOXICAM 15 MG TABLET: 15 | 30 days supply | Qty: 30 | Fill #2

## 2016-02-08 MED FILL — HYDROCHLOROTHIAZIDE 25 MG T: 25 | 30 days supply | Qty: 30 | Fill #2

## 2016-02-08 MED FILL — GABAPENTIN 300 MG CAPSULE: 300 | 30 days supply | Qty: 90 | Fill #1

## 2016-02-08 MED FILL — PIOGLITAZONE HCL 30 MG TAB: 30 | 30 days supply | Qty: 15 | Fill #6

## 2016-02-08 MED FILL — ?PRAVASTATIN NA 40 MG TAB: 40 MG | 30 days supply | Qty: 30 | Fill #2

## 2016-02-18 MED FILL — ?METFORMIN HCL 500MG TABLET: 500 | 30 days supply | Qty: 60 | Fill #1

## 2016-03-10 MED FILL — ?PRAVASTATIN NA 40 MG TAB: 40 MG | 30 days supply | Qty: 30 | Fill #3

## 2016-03-10 MED FILL — HYDROCHLOROTHIAZIDE 25 MG T: 25 | 30 days supply | Qty: 30 | Fill #3

## 2016-03-10 MED FILL — !NOVOLOG 100UNITS/ML VIAL: 100/ML | 33 days supply | Qty: 10 | Fill #2

## 2016-03-10 MED FILL — PIOGLITAZONE HCL 30 MG TAB: 30 | 30 days supply | Qty: 15 | Fill #7

## 2016-03-22 MED FILL — metFORMIN HCL 500 MG TABS: 500 | 30 days supply | Qty: 60 | Fill #2

## 2016-03-22 MED FILL — GABAPENTIN 300 MG CAPSULE: 300 | 30 days supply | Qty: 90 | Fill #2

## 2016-04-04 ENCOUNTER — Other Ambulatory Visit: Payer: Self-pay | Admitting: Internal Medicine

## 2016-04-04 MED FILL — PIOGLITAZONE HCL 30 MG TAB: 30 | 30 days supply | Qty: 15 | Fill #0

## 2016-04-07 MED FILL — ?PRAVASTATIN NA 40 MG TAB: 40 MG | 30 days supply | Qty: 30 | Fill #0

## 2016-04-18 MED FILL — HYDROCHLOROTHIAZIDE 25 MG T: 25 | 30 days supply | Qty: 30 | Fill #4

## 2016-04-18 MED FILL — !NOVOLOG 100UNITS/ML VIAL: 100/ML | 33 days supply | Qty: 10 | Fill #3

## 2016-04-24 ENCOUNTER — Ambulatory Visit: Payer: Self-pay | Attending: Internal Medicine

## 2016-04-25 ENCOUNTER — Other Ambulatory Visit: Payer: Self-pay

## 2016-04-25 ENCOUNTER — Other Ambulatory Visit: Payer: Self-pay | Admitting: Internal Medicine

## 2016-04-25 MED ORDER — INSULIN ASPART 100 UNIT/ML ~~LOC~~ SOLN: 10.0000 [IU] | Freq: Three times a day (TID) | SUBCUTANEOUS | 3 refills | Status: DC

## 2016-04-25 MED FILL — ?METFORMIN HCL 500MG TABLET: 500 | 30 days supply | Qty: 60 | Fill #0

## 2016-05-12 ENCOUNTER — Other Ambulatory Visit: Payer: Self-pay | Admitting: Internal Medicine

## 2016-05-12 MED FILL — ?PIOGLITAZONE HCL 30 MG TAB: 30 MG | 30 days supply | Qty: 15 | Fill #1

## 2016-05-12 MED FILL — MELOXICAM 15 MG TABLET: 15 | 30 days supply | Qty: 30 | Fill #0

## 2016-05-12 MED FILL — PRAVASTATIN NA 40 MG TAB: 40 | 30 days supply | Qty: 30 | Fill #1

## 2016-05-14 ENCOUNTER — Other Ambulatory Visit: Payer: Self-pay | Admitting: Internal Medicine

## 2016-05-26 MED FILL — GABAPENTIN 300 MG CAPSULE: 300 | 30 days supply | Qty: 90 | Fill #3

## 2016-05-26 MED FILL — !NOVOLOG 100UNITS/ML VIAL: 100/ML | 28 days supply | Qty: 10 | Fill #4

## 2016-05-27 MED FILL — TRUEPLUS SYR 1ML 30GX5/16": 30G X 5/16" | 30 days supply | Qty: 100 | Fill #0

## 2016-05-27 MED FILL — TRUEPLUS SYR 1ML 30GX5/16: 30G X 5/16" | 30 days supply | Qty: 100 | Fill #0

## 2016-06-05 ENCOUNTER — Other Ambulatory Visit: Payer: Self-pay | Admitting: Internal Medicine

## 2016-06-05 MED FILL — ?PIOGLITAZONE HCL 30 MG TAB: 30 MG | 30 days supply | Qty: 15 | Fill #2

## 2016-06-05 MED FILL — PRAVASTATIN NA 40 MG TAB: 40 | 30 days supply | Qty: 30 | Fill #2

## 2016-06-09 ENCOUNTER — Other Ambulatory Visit: Payer: Self-pay | Admitting: Internal Medicine

## 2016-06-09 MED FILL — metFORMIN HCL 500 MG TABS: 500 | 30 days supply | Qty: 60 | Fill #0

## 2016-06-10 ENCOUNTER — Other Ambulatory Visit: Payer: Self-pay | Admitting: Internal Medicine

## 2016-06-10 ENCOUNTER — Ambulatory Visit: Payer: Self-pay | Attending: Internal Medicine | Admitting: Internal Medicine

## 2016-06-10 VITALS — BP 144/87 | HR 85 | Temp 98.6°F | Resp 16 | Wt 397.2 lb

## 2016-06-10 DIAGNOSIS — Z794 Long term (current) use of insulin: Secondary | ICD-10-CM

## 2016-06-10 DIAGNOSIS — I10 Essential (primary) hypertension: Secondary | ICD-10-CM

## 2016-06-10 DIAGNOSIS — E785 Hyperlipidemia, unspecified: Secondary | ICD-10-CM

## 2016-06-10 DIAGNOSIS — M79609 Pain in unspecified limb: Secondary | ICD-10-CM | POA: Insufficient documentation

## 2016-06-10 DIAGNOSIS — Z7982 Long term (current) use of aspirin: Secondary | ICD-10-CM | POA: Insufficient documentation

## 2016-06-10 DIAGNOSIS — E1140 Type 2 diabetes mellitus with diabetic neuropathy, unspecified: Secondary | ICD-10-CM

## 2016-06-10 DIAGNOSIS — F329 Major depressive disorder, single episode, unspecified: Secondary | ICD-10-CM

## 2016-06-10 LAB — GLUCOSE, POCT (MANUAL RESULT ENTRY): POC GLUCOSE: 216 mg/dL — AB (ref 70–99)

## 2016-06-10 LAB — POCT GLYCOSYLATED HEMOGLOBIN (HGB A1C): HEMOGLOBIN A1C: 8.7

## 2016-06-10 MED ORDER — TRUEPLUS LANCETS 26G MISC: 1.0000 | Freq: Three times a day (TID) | 12 refills | Status: DC | PRN

## 2016-06-10 MED ORDER — METFORMIN HCL 1000 MG PO TABS: 1000.0000 mg | ORAL_TABLET | Freq: Two times a day (BID) | ORAL | 3 refills | Status: DC

## 2016-06-10 MED ORDER — GLUCOSE BLOOD VI STRP: ORAL_STRIP | 12 refills | Status: DC

## 2016-06-10 MED ORDER — PIOGLITAZONE HCL 30 MG PO TABS: 15.0000 mg | ORAL_TABLET | Freq: Every day | ORAL | 3 refills | Status: DC

## 2016-06-10 MED ORDER — INSULIN DETEMIR 100 UNIT/ML ~~LOC~~ SOLN: 15.0000 [IU] | Freq: Every day | SUBCUTANEOUS | 11 refills | Status: DC

## 2016-06-10 MED ORDER — LISINOPRIL-HYDROCHLOROTHIAZIDE 10-12.5 MG PO TABS: 1.0000 | ORAL_TABLET | Freq: Every day | ORAL | 3 refills | Status: DC

## 2016-06-10 MED ORDER — INSULIN ASPART 100 UNIT/ML ~~LOC~~ SOLN: 15.0000 [IU] | Freq: Three times a day (TID) | SUBCUTANEOUS | 3 refills | Status: DC

## 2016-06-10 MED ORDER — MELOXICAM 15 MG PO TABS: 15.0000 mg | ORAL_TABLET | Freq: Every day | ORAL | 3 refills | Status: DC

## 2016-06-10 MED ORDER — ASPIRIN EC 81 MG PO TBEC: 81.0000 mg | DELAYED_RELEASE_TABLET | Freq: Every day | ORAL | 2 refills | Status: DC

## 2016-06-10 MED ORDER — GABAPENTIN 300 MG PO CAPS: 300.0000 mg | ORAL_CAPSULE | Freq: Three times a day (TID) | ORAL | 3 refills | Status: DC

## 2016-06-10 MED ORDER — TRUE METRIX GO GLUCOSE METER W/DEVICE KIT: 1.0000 | PACK | Freq: Three times a day (TID) | 0 refills | Status: DC | PRN

## 2016-06-10 MED ORDER — PRAVASTATIN SODIUM 40 MG PO TABS: 40.0000 mg | ORAL_TABLET | Freq: Every day | ORAL | 3 refills | Status: DC

## 2016-06-10 MED FILL — LISINOPRIL-HCTZ 10-12.5 MG: 10-12.5 | 90 days supply | Qty: 90 | Fill #0

## 2016-06-10 MED FILL — TRUEplus LANCETS 28G MISC: 33 days supply | Qty: 100 | Fill #0

## 2016-06-10 MED FILL — !NOVOLOG 100UNITS/ML VIAL: 100/ML | 22 days supply | Qty: 10 | Fill #0

## 2016-06-10 MED FILL — TRUE METRIX TEST STRIP: 100 days supply | Qty: 100 | Fill #0

## 2016-06-10 MED FILL — !TRUE METRIX BLOOD GLUCOSE: 1 days supply | Qty: 1 | Fill #0

## 2016-06-10 MED FILL — !LEVEMIR 100 UNITS/ML VIAL: 100/ML | 30 days supply | Qty: 10 | Fill #0

## 2016-06-10 MED FILL — ?MELOXICAM 15 MG TABLET: 15 MG | 30 days supply | Qty: 30 | Fill #0

## 2016-06-10 NOTE — Progress Notes (Signed)
Dillon Mcgee, is a 56 y.o. male  HEN:[id number]  TIR:[id number]  DOB - [birth year]  Chief Complaint  Patient presents with  . Diabetes        Subjective:   Dillon Mcgee is a 56 y.o. 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    He has been taking all his meds as prescribed, but has started to eat a lot more due to depression from losing his job about 2 weeks ago. +sedentary lifestyle.  c/o of intermittent swelling in bilat legs as well, which goes away, none currently.  Does not smoke or drink etoh.  Patient has No headache, No chest pain, No abdominal pain - No Nausea, No new weakness tingling or numbness, No Cough - SOB.  No problems updated.  ALLERGIES: No Known Allergies  PAST MEDICAL HISTORY: Past Medical History:  Diagnosis Date  . Diabetes mellitus without complication ([place])   . Hyperlipidemia   . Hypertension     MEDICATIONS AT HOME: Prior to Admission medications   Medication Sig Start Date End Date Taking? Authorizing Provider  aspirin EC 81 MG tablet Take 1 tablet (81 mg total) by mouth daily. 06/10/16   Maren Reamer, MD  Blood Glucose Monitoring Suppl (TRUE METRIX GO GLUCOSE METER) w/Device KIT 1 each by Does not apply route every 8 (eight) hours as needed. 06/10/16   Maren Reamer, MD  ferrous sulfate 325 (65 FE) MG tablet Take 325 mg by mouth daily with breakfast.    Historical Provider, MD  gabapentin (NEURONTIN) 300 MG capsule Take 1 capsule (300 mg total) by mouth 3 (three) times daily. 06/10/16   Maren Reamer, MD  glucose blood test strip Use as instructed 06/10/16   Maren Reamer, MD  hydrochlorothiazide (HYDRODIURIL) 25 MG tablet Take 1 tablet (25 mg total) by mouth daily. 12/03/15   Maren Reamer, MD  ibuprofen (ADVIL,MOTRIN) 600 MG tablet Take 1 tablet (600 mg total) by mouth every 8 (eight) hours as  needed. 05/31/14   Lorayne Marek, MD  insulin aspart (NOVOLOG) 100 UNIT/ML injection Inject 15 Units into the skin 3 (three) times daily with meals. 06/10/16   Maren Reamer, MD  insulin detemir (LEVEMIR) 100 UNIT/ML injection Inject 0.15 mLs (15 Units total) into the skin at bedtime. 06/10/16   Maren Reamer, MD  meloxicam (MOBIC) 15 MG tablet Take 1 tablet (15 mg total) by mouth daily. Take with food 06/10/16   Maren Reamer, MD  metFORMIN (GLUCOPHAGE) 1000 MG tablet Take 1 tablet (1,000 mg total) by mouth 2 (two) times daily with a meal. 06/10/16   Maren Reamer, MD  pioglitazone (ACTOS) 30 MG tablet Take 0.5 tablets (15 mg total) by mouth daily. 06/10/16   Maren Reamer, MD  pravastatin (PRAVACHOL) 40 MG tablet Take 1 tablet (40 mg total) by mouth daily. 06/10/16   Maren Reamer, MD  triamcinolone cream (KENALOG) 0.1 % Apply 1 application topically 2 (two) times daily. 01/01/16   Maren Reamer, MD  TRUEPLUS INSULIN SYRINGE 30G X 5/16" 1 ML MISC USE TO INJECT INTO THE SKIN AS PRESCRIBED BY PHYSICIAN 05/14/16   Maren Reamer, MD  TRUEPLUS LANCETS 26G MISC 1 each by Does not apply route every 8 (eight) hours as needed. 06/10/16   Maren Reamer, MD  Vitamin D, Ergocalciferol, (DRISDOL) 50000 UNITS  CAPS capsule Take 1 capsule (50,000 Units total) by mouth every 7 (seven) days. Patient not taking: Reported on 01/01/2016 02/23/14   Lorayne Marek, MD     Objective:   Vitals:   06/10/16 1125  BP: (!) 144/87  Pulse: 85  Resp: 16  Temp: 98.6 F (37 C)  TempSrc: Oral  SpO2: 95%  Weight: (!) 397 lb 3.2 oz (180.2 kg)   Weight 10/17 374  Exam General appearance : Awake, alert, not in any distress. Speech Clear. Not toxic looking, morbid obese HEENT: Atraumatic and Normocephalic, pupils equally reactive to light. Neck: supple, no JVD.  Chest:Good air entry bilaterally, no added sounds. CVS: S1 S2 regular, no murmurs/gallups or rubs. Abdomen: Bowel sounds active, obese,  Non  tender. Limited exam due to body habitus. Foot exam: chronic venous stasis changes noted, +varicose veins noted bilat, trace edema bilat as well.  bilateral peripheral pulses 2+ (dorsalis pedis and post tibialis pulses), no ulcers noted/no ecchymosis, warm to touch, monofilament testing 3/3 bilat. Sensation intact.   Neurology: Awake alert, and oriented X 3, CN II-XII grossly intact, Non focal Skin:No Rash  Data Review Lab Results  Component Value Date   HGBA1C 8.7 06/10/2016   HGBA1C 7.9 12/03/2015   HGBA1C 9.1 05/31/2015    Depression screen PHQ 2/9 06/10/2016 01/01/2016 12/03/2015 05/31/2015 09/25/2014  Decreased Interest 0 0 0 0 0  Down, Depressed, Hopeless 1 0 0 0 0  PHQ - 2 Score 1 0 0 0 0      Assessment & Plan   1. Type 2 diabetes mellitus with diabetic neuropathy, with long-term current use of insulin (HCC) - he has been doing 10-20 units of novolog prior to meals. - increase metformin to 1042m bid - continue actos 150mqd - continue novolog qac - add levemir 15 u qhs - POCT glucose (manual entry) 216 - POCT glycosylated hemoglobin (Hb A1C) 8.7 (from 7.9) - ada diet discussed, increase activity recd as well - fu [facility] 2 wks for dm and bp check  2. Essential hypertension Uncontrolled currently, was controlled in oct. Suspect due to worsening dietary discretion. Pt was not on acei. Change hctz 25 to prinzide 10-12.5 qd for now. * bp check [facility]/ [facility] 2 wks.  3. Hyperlipidemia, unspecified hyperlipidemia type Continue asa 81 and pravastin 40 qhs for cvs/stroke protection.  4. Obesity, morbid (HCC) Recd increase exercise, watching his diet better  5. Reactive depression - currently unemployed, d/w him that increase his activity will help his mood as well.  6. Financial aid.     Patient have been counseled extensively about nutrition and exercise  Return in about 2 months (around 08/10/2016), or if symptoms worsen or fail to  improve.  The patient was given clear instructions to go to ER or return to medical center if symptoms don't improve, worsen or new problems develop. The patient verbalized understanding. The patient was told to call to get lab results if they haven't heard anything in the next week.   This note has been created with DrSurveyor, quantityAny transcriptional errors are unintentional.   DaMaren ReamerMD, MBBatchtownnd WeR[facility] [place] 06/10/2016, 12:14 PM

## 2016-06-10 NOTE — Patient Instructions (Addendum)
[facility] - 2 wks for dm and bp check  -  financial aid -  - Diabetes Mellitus and Food It is important for you to manage your blood sugar (glucose) level. Your blood glucose level can be greatly affected by what you eat. Eating healthier foods in the appropriate amounts throughout the day at about the same time each day will help you control your blood glucose level. It can also help slow or prevent worsening of your diabetes mellitus. Healthy eating may even help you improve the level of your blood pressure and reach or maintain a healthy weight. General recommendations for healthful eating and cooking habits include:  Eating meals and snacks regularly. Avoid going long periods of time without eating to lose weight.  Eating a diet that consists mainly of plant-based foods, such as fruits, vegetables, nuts, legumes, and whole grains.  Using low-heat cooking methods, such as baking, instead of high-heat cooking methods, such as deep frying. Work with your dietitian to make sure you understand how to use the Nutrition Facts information on food labels. How can food affect me? Carbohydrates  Carbohydrates affect your blood glucose level more than any other type of food. Your dietitian will help you determine how many carbohydrates to eat at each meal and teach you how to count carbohydrates. Counting carbohydrates is important to keep your blood glucose at a healthy level, especially if you are using insulin or taking certain medicines for diabetes mellitus. Alcohol  Alcohol can cause sudden decreases in blood glucose (hypoglycemia), especially if you use insulin or take certain medicines for diabetes mellitus. Hypoglycemia can be a life-threatening condition. Symptoms of hypoglycemia (sleepiness, dizziness, and disorientation) are similar to symptoms of having too much alcohol. If your health care provider has given you approval to drink alcohol, do so in moderation and use the following  guidelines:  Women should not have more than one drink per day, and men should not have more than two drinks per day. One drink is equal to:  12 oz of beer.  5 oz of wine.  1 oz of hard liquor.  Do not drink on an empty stomach.  Keep yourself hydrated. Have water, diet soda, or unsweetened iced tea.  Regular soda, juice, and other mixers might contain a lot of carbohydrates and should be counted. What foods are not recommended? As you make food choices, it is important to remember that all foods are not the same. Some foods have fewer nutrients per serving than other foods, even though they might have the same number of calories or carbohydrates. It is difficult to get your body what it needs when you eat foods with fewer nutrients. Examples of foods that you should avoid that are high in calories and carbohydrates but low in nutrients include:  Trans fats (most processed foods list trans fats on the Nutrition Facts label).  Regular soda.  Juice.  Candy.  Sweets, such as cake, pie, doughnuts, and cookies.  Fried foods. What foods can I eat? Eat nutrient-rich foods, which will nourish your body and keep you healthy. The food you should eat also will depend on several factors, including:  The calories you need.  The medicines you take.  Your weight.  Your blood glucose level.  Your blood pressure level.  Your cholesterol level. You should eat a variety of foods, including:  Protein.  Lean cuts of meat.  Proteins low in saturated fats, such as fish, egg whites, and beans. Avoid processed meats.  Fruits and vegetables.  Fruits and vegetables that may help control blood glucose levels, such as apples, mangoes, and yams.  Dairy products.  Choose fat-free or low-fat dairy products, such as milk, yogurt, and cheese.  Grains, bread, pasta, and rice.  Choose whole grain products, such as multigrain bread, whole oats, and brown rice. These foods may help control  blood pressure.  Fats.  Foods containing healthful fats, such as nuts, avocado, olive oil, canola oil, and fish. Does everyone with diabetes mellitus have the same meal plan? Because every person with diabetes mellitus is different, there is not one meal plan that works for everyone. It is very important that you meet with a dietitian who will help you create a meal plan that is just right for you. This information is not intended to replace advice given to you by your health care provider. Make sure you discuss any questions you have with your health care provider. Document Released: 11/14/2004 Document Revised: 07/26/2015 Document Reviewed: 01/14/2013 Elsevier Interactive Patient Education  2017 Elsevier Inc.  -  Diabetes Mellitus and Exercise Exercising regularly is important for your overall health, especially when you have diabetes (diabetes mellitus). Exercising is not only about losing weight. It has many health benefits, such as increasing muscle strength and bone density and reducing body fat and stress. This leads to improved fitness, flexibility, and endurance, all of which result in better overall health. Exercise has additional benefits for people with diabetes, including:  Reducing appetite.  Helping to lower and control blood glucose.  Lowering blood pressure.  Helping to control amounts of fatty substances (lipids) in the blood, such as cholesterol and triglycerides.  Helping the body to respond better to insulin (improving insulin sensitivity).  Reducing how much insulin the body needs.  Decreasing the risk for heart disease by:  Lowering cholesterol and triglyceride levels.  Increasing the levels of good cholesterol.  Lowering blood glucose levels. What is my activity plan? Your health care provider or certified diabetes educator can help you make a plan for the type and frequency of exercise (activity plan) that works for you. Make sure that you:  Do at  least 150 minutes of moderate-intensity or vigorous-intensity exercise each week. This could be brisk walking, biking, or water aerobics.  Do stretching and strength exercises, such as yoga or weightlifting, at least 2 times a week.  Spread out your activity over at least 3 days of the week.  Get some form of physical activity every day.  Do not go more than 2 days in a row without some kind of physical activity.  Avoid being inactive for more than 90 minutes at a time. Take frequent breaks to walk or stretch.  Choose a type of exercise or activity that you enjoy, and set realistic goals.  Start slowly, and gradually increase the intensity of your exercise over time. What do I need to know about managing my diabetes?  Check your blood glucose before and after exercising.  If your blood glucose is higher than 240 mg/dL (16.1 mmol/L) before you exercise, check your urine for ketones. If you have ketones in your urine, do not exercise until your blood glucose returns to normal.  Know the symptoms of low blood glucose (hypoglycemia) and how to treat it. Your risk for hypoglycemia increases during and after exercise. Common symptoms of hypoglycemia can include:  Hunger.  Anxiety.  Sweating and feeling clammy.  Confusion.  Dizziness or feeling light-headed.  Increased heart rate or palpitations.  Blurry vision.  Tingling or numbness around the mouth, lips, or tongue.  Tremors or shakes.  Irritability.  Keep a rapid-acting carbohydrate snack available before, during, and after exercise to help prevent or treat hypoglycemia.  Avoid injecting insulin into areas of the body that are going to be exercised. For example, avoid injecting insulin into:  The arms, when playing tennis.  The legs, when jogging.  Keep records of your exercise habits. Doing this can help you and your health care provider adjust your diabetes management plan as needed. Write down:  Food that you eat  before and after you exercise.  Blood glucose levels before and after you exercise.  The type and amount of exercise you have done.  When your insulin is expected to peak, if you use insulin. Avoid exercising at times when your insulin is peaking.  When you start a new exercise or activity, work with your health care provider to make sure the activity is safe for you, and to adjust your insulin, medicines, or food intake as needed.  Drink plenty of water while you exercise to prevent dehydration or heat stroke. Drink enough fluid to keep your urine clear or pale yellow. This information is not intended to replace advice given to you by your health care provider. Make sure you discuss any questions you have with your health care provider. Document Released: 05/10/2003 Document Revised: 09/07/2015 Document Reviewed: 07/30/2015 Elsevier Interactive Patient Education  2017 Elsevier Inc.  -   Low-Sodium Eating Plan Sodium, which is an element that makes up salt, helps you maintain a healthy balance of fluids in your body. Too much sodium can increase your blood pressure and cause fluid and waste to be held in your body. Your health care provider or dietitian may recommend following this plan if you have high blood pressure (hypertension), kidney disease, liver disease, or heart failure. Eating less sodium can help lower your blood pressure, reduce swelling, and protect your heart, liver, and kidneys. What are tips for following this plan? General guidelines   Most people on this plan should limit their sodium intake to 1,500-2,000 mg (milligrams) of sodium each day. Reading food labels   The Nutrition Facts label lists the amount of sodium in one serving of the food. If you eat more than one serving, you must multiply the listed amount of sodium by the number of servings.  Choose foods with less than 140 mg of sodium per serving.  Avoid foods with 300 mg of sodium or more per  serving. Shopping   Look for lower-sodium products, often labeled as "low-sodium" or "no salt added."  Always check the sodium content even if foods are labeled as "unsalted" or "no salt added".  Buy fresh foods.  Avoid canned foods and premade or frozen meals.  Avoid canned, cured, or processed meats  Buy breads that have less than 80 mg of sodium per slice. Cooking   Eat more home-cooked food and less restaurant, buffet, and fast food.  Avoid adding salt when cooking. Use salt-free seasonings or herbs instead of table salt or sea salt. Check with your health care provider or pharmacist before using salt substitutes.  Cook with plant-based oils, such as canola, sunflower, or olive oil. Meal planning   When eating at a restaurant, ask that your food be prepared with less salt or no salt, if possible.  Avoid foods that contain MSG (monosodium glutamate). MSG is sometimes added to [place] food, bouillon, and some canned foods. What foods  are recommended? The items listed may not be a complete list. Talk with your dietitian about what dietary choices are best for you. Grains  Low-sodium cereals, including oats, puffed wheat and rice, and shredded wheat. Low-sodium crackers. Unsalted rice. Unsalted pasta. Low-sodium bread. Whole-grain breads and whole-grain pasta. Vegetables  Fresh or frozen vegetables. "No salt added" canned vegetables. "No salt added" tomato sauce and paste. Low-sodium or reduced-sodium tomato and vegetable juice. Fruits  Fresh, frozen, or canned fruit. Fruit juice. Meats and other protein foods  Fresh or frozen (no salt added) meat, poultry, seafood, and fish. Low-sodium canned tuna and salmon. Unsalted nuts. Dried peas, beans, and lentils without added salt. Unsalted canned beans. Eggs. Unsalted nut butters. Dairy  Milk. Soy milk. Cheese that is naturally low in sodium, such as ricotta cheese, fresh mozzarella, or Swiss cheese Low-sodium or reduced-sodium cheese.  Cream cheese. Yogurt. Fats and oils  Unsalted butter. Unsalted margarine with no trans fat. Vegetable oils such as canola or olive oils. Seasonings and other foods  Fresh and dried herbs and spices. Salt-free seasonings. Low-sodium mustard and ketchup. Sodium-free salad dressing. Sodium-free light mayonnaise. Fresh or refrigerated horseradish. Lemon juice. Vinegar. Homemade, reduced-sodium, or low-sodium soups. Unsalted popcorn and pretzels. Low-salt or salt-free chips. What foods are not recommended? The items listed may not be a complete list. Talk with your dietitian about what dietary choices are best for you. Grains  Instant hot cereals. Bread stuffing, pancake, and biscuit mixes. Croutons. Seasoned rice or pasta mixes. Noodle soup cups. Boxed or frozen macaroni and cheese. Regular salted crackers. Self-rising flour. Vegetables  Sauerkraut, pickled vegetables, and relishes. Olives. [place] fries. Onion rings. Regular canned vegetables (not low-sodium or reduced-sodium). Regular canned tomato sauce and paste (not low-sodium or reduced-sodium). Regular tomato and vegetable juice (not low-sodium or reduced-sodium). Frozen vegetables in sauces. Meats and other protein foods  Meat or fish that is salted, canned, smoked, spiced, or pickled. Bacon, ham, sausage, hotdogs, corned beef, chipped beef, packaged lunch meats, salt pork, jerky, pickled herring, anchovies, regular canned tuna, sardines, salted nuts. Dairy  Processed cheese and cheese spreads. Cheese curds. Blue cheese. Feta cheese. String cheese. Regular cottage cheese. Buttermilk. Canned milk. Fats and oils  Salted butter. Regular margarine. Ghee. Bacon fat. Seasonings and other foods  Onion salt, garlic salt, seasoned salt, table salt, and sea salt. Canned and packaged gravies. Worcestershire sauce. Tartar sauce. Barbecue sauce. Teriyaki sauce. Soy sauce, including reduced-sodium. Steak sauce. Fish sauce. Oyster sauce. Cocktail sauce.  Horseradish that you find on the shelf. Regular ketchup and mustard. Meat flavorings and tenderizers. Bouillon cubes. Hot sauce and Tabasco sauce. Premade or packaged marinades. Premade or packaged taco seasonings. Relishes. Regular salad dressings. Salsa. Potato and tortilla chips. Corn chips and puffs. Salted popcorn and pretzels. Canned or dried soups. Pizza. Frozen entrees and pot pies. Summary  Eating less sodium can help lower your blood pressure, reduce swelling, and protect your heart, liver, and kidneys.  Most people on this plan should limit their sodium intake to 1,500-2,000 mg (milligrams) of sodium each day.  Canned, boxed, and frozen foods are high in sodium. Restaurant foods, fast foods, and pizza are also very high in sodium. You also get sodium by adding salt to food.  Try to cook at home, eat more fresh fruits and vegetables, and eat less fast food, canned, processed, or prepared foods. This information is not intended to replace advice given to you by your health care provider. Make sure you discuss any questions you have  with your health care provider. Document Released: 08/09/2001 Document Revised: 02/11/2016 Document Reviewed: 02/11/2016 Elsevier Interactive Patient Education  2017 Elsevier Inc.   -   Hypertension Hypertension is another name for high blood pressure. High blood pressure forces your heart to work harder to pump blood. This can cause problems over time. There are two numbers in a blood pressure reading. There is a top number (systolic) over a bottom number (diastolic). It is best to have a blood pressure below 120/80. Healthy choices can help lower your blood pressure. You may need medicine to help lower your blood pressure if:  Your blood pressure cannot be lowered with healthy choices.  Your blood pressure is higher than 130/80. Follow these instructions at home: Eating and drinking   If directed, follow the DASH eating plan. This diet  includes:  Filling half of your plate at each meal with fruits and vegetables.  Filling one quarter of your plate at each meal with whole grains. Whole grains include whole wheat pasta, brown rice, and whole grain bread.  Eating or drinking low-fat dairy products, such as skim milk or low-fat yogurt.  Filling one quarter of your plate at each meal with low-fat (lean) proteins. Low-fat proteins include fish, skinless chicken, eggs, beans, and tofu.  Avoiding fatty meat, cured and processed meat, or chicken with skin.  Avoiding premade or processed food.  Eat less than 1,500 mg of salt (sodium) a day.  Limit alcohol use to no more than 1 drink a day for nonpregnant women and 2 drinks a day for men. One drink equals 12 oz of beer, 5 oz of wine, or 1 oz of hard liquor. Lifestyle   Work with your doctor to stay at a healthy weight or to lose weight. Ask your doctor what the best weight is for you.  Get at least 30 minutes of exercise that causes your heart to beat faster (aerobic exercise) most days of the week. This may include walking, swimming, or biking.  Get at least 30 minutes of exercise that strengthens your muscles (resistance exercise) at least 3 days a week. This may include lifting weights or pilates.  Do not use any products that contain nicotine or tobacco. This includes cigarettes and e-cigarettes. If you need help quitting, ask your doctor.  Check your blood pressure at home as told by your doctor.  Keep all follow-up visits as told by your doctor. This is important. Medicines   Take over-the-counter and prescription medicines only as told by your doctor. Follow directions carefully.  Do not skip doses of blood pressure medicine. The medicine does not work as well if you skip doses. Skipping doses also puts you at risk for problems.  Ask your doctor about side effects or reactions to medicines that you should watch for. Contact a doctor if:  You think you are having  a reaction to the medicine you are taking.  You have headaches that keep coming back (recurring).  You feel dizzy.  You have swelling in your ankles.  You have trouble with your vision. Get help right away if:  You get a very bad headache.  You start to feel confused.  You feel weak or numb.  You feel faint.  You get very bad pain in your:  Chest.  Belly (abdomen).  You throw up (vomit) more than once.  You have trouble breathing. Summary  Hypertension is another name for high blood pressure.  Making healthy choices can help lower blood pressure. If  your blood pressure cannot be controlled with healthy choices, you may need to take medicine. This information is not intended to replace advice given to you by your health care provider. Make sure you discuss any questions you have with your health care provider. Document Released: 08/06/2007 Document Revised: 01/16/2016 Document Reviewed: 01/16/2016 Elsevier Interactive Patient Education  2017 [employer]

## 2016-06-11 ENCOUNTER — Other Ambulatory Visit: Payer: Self-pay

## 2016-06-11 MED ORDER — INSULIN DETEMIR 100 UNIT/ML ~~LOC~~ SOLN: 15.0000 [IU] | Freq: Every day | SUBCUTANEOUS | 3 refills | Status: DC

## 2016-06-11 MED ORDER — INSULIN DETEMIR 100 UNIT/ML ~~LOC~~ SOLN: 15.0000 [IU] | Freq: Every day | SUBCUTANEOUS | 11 refills | Status: DC

## 2016-06-17 ENCOUNTER — Ambulatory Visit: Payer: Self-pay | Attending: Internal Medicine

## 2016-06-24 ENCOUNTER — Ambulatory Visit: Payer: Self-pay | Attending: Internal Medicine | Admitting: Pharmacist

## 2016-06-24 VITALS — BP 133/82 | HR 80

## 2016-06-24 DIAGNOSIS — I10 Essential (primary) hypertension: Secondary | ICD-10-CM | POA: Insufficient documentation

## 2016-06-24 DIAGNOSIS — E119 Type 2 diabetes mellitus without complications: Secondary | ICD-10-CM | POA: Insufficient documentation

## 2016-06-24 DIAGNOSIS — Z794 Long term (current) use of insulin: Secondary | ICD-10-CM | POA: Insufficient documentation

## 2016-06-24 DIAGNOSIS — E1140 Type 2 diabetes mellitus with diabetic neuropathy, unspecified: Secondary | ICD-10-CM

## 2016-06-24 MED ORDER — INSULIN DETEMIR 100 UNIT/ML ~~LOC~~ SOLN: 18.0000 [IU] | Freq: Every day | SUBCUTANEOUS | 3 refills | Status: DC

## 2016-06-24 MED ORDER — "INSULIN SYRINGE-NEEDLE U-100 30G X 5/16"" 1 ML MISC": 3 refills | Status: DC

## 2016-06-24 MED FILL — TRUEPLUS SYR 0.5ML 30GX5/16: 30G X 5/16" | 25 days supply | Qty: 100 | Fill #0

## 2016-06-24 MED FILL — !LEVEMIR 100 UNITS/ML VIAL: 100/ML | 30 days supply | Qty: 10 | Fill #0

## 2016-06-24 NOTE — Progress Notes (Signed)
S:  No chief complaint today.   DM-  Patient arrives in good spirits.  Presents for diabetes evaluation, education, and management at the request of [facility]. Patient was referred on 06/10/16.  Patient was last seen by Primary Care Provider on 06/10/16.    Patient reports adherence with medications. Current diabetes medications include: insulin detemir (Levemir) 15 Units QHS, insulin aspart (novolog) 15 Units TID, metformin  BID, pioglitazone 30 mg daily  Patient reports single hypoglycemic episode since adding levemir, patient ate something sweet to bring back up. Patient reported dietary habits: follows diabetic diet hand out Patient reported exercise habits: walking in neighborhood Patient reports nocturia - 2/3 times per night.  Patient denies neuropathy as gabapentin is working well. Patient denies visual changes. Patient reports self foot exams.  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  Current BP Medications include:  hydrochlorothiazide/lisinopril 10-12.5 QD   O:   DM- Home fasting CBG: 68, 150's  2 hour post-prandial/random CBG: none - counseled to bring at next visit.   Last A1c: 8.7 (06/10/16)  HTN- Last 3 Office BP readings: BP Readings from Last 3 Encounters: 06/24/16: 133/82  06/10/16: (!)144/87 01/01/16: 130/80  A/P:  DM- Diabetes currently UNcontrolled. Patient reports single hypoglycemic event and is able to verbalize appropriate hypoglycemia management plan. Patient reports adherence with medication. Control is suboptimal due to elevated A1c and home readings - patient has made dietary and exercise changes to lifestyle. Increase insulin detemir (levemir) to 18 Units QHS.    Next A1C anticipated July 2018.      HTN-  HTN is controlled on current medications. No changes made at this time. Encouraged patient to take all of his medications as prescribed and  to not miss any doses. Patient verbalized understanding.  Patient reports being very concerned with money pertaining to visits and affording medications. Patient was encouraged to talk to financial counseling and the pharmacy about patient assistance programs to help make visits/meds more affordable.   Results reviewed and written information provided. Total time in face-to-face counseling 40 minutes.   F/U Clinic Visit with Viann Fish in 4 weeks.  Patient seen with Cheral Bay, PharmD Candidate

## 2016-06-24 NOTE — Patient Instructions (Addendum)
Thanks for coming to see Dillon Mcgee!  Great job with your diet and exercise! Make sure you have a snack or candy to take with you when you exercise or have a snack before.  Increase Levemir to 18 units daily.  Follow up with me in 4 weeks.  Talk to the pharmacy about help with affording your medications, they have programs that can help you get some of your medications for reduced or no cost.  Follow up with Diane about financial assistance.   Hypoglycemia  Hypoglycemia is when the sugar (glucose) level in the blood is too low. Symptoms of low blood sugar may include:  Feeling:  Hungry.  Worried or nervous (anxious).  Sweaty and clammy.  Confused.  Dizzy.  Sleepy.  Sick to your stomach (nauseous).  Having:  A fast heartbeat.  A headache.  A change in your vision.  Jerky movements that you cannot control (seizure).  Nightmares.  Tingling or no feeling (numbness) around the mouth, lips, or tongue.  Having trouble with:  Talking.  Paying attention (concentrating).  Moving (coordination).  Sleeping.  Shaking.  Passing out (fainting).  Getting upset easily (irritability). Low blood sugar can happen to people who have diabetes and people who do not have diabetes. Low blood sugar can happen quickly, and it can be an emergency. Treating Low Blood Sugar  Low blood sugar is often treated by eating or drinking something sugary right away. If you can think clearly and swallow safely, follow the 15:15 rule:  Take 15 grams of a fast-acting carb (carbohydrate). Some fast-acting carbs are:  1 tube of glucose gel.  3 sugar tablets (glucose pills).  6-8 pieces of hard candy.  4 oz (120 mL) of fruit juice.  4 oz (120 mL) of regular (not diet) soda.  Check your blood sugar 15 minutes after you take the carb.  If your blood sugar is still at or below 70 mg/dL (3.9 mmol/L), take 15 grams of a carb again.  If your blood sugar does not go above 70 mg/dL (3.9 mmol/L)  after 3 tries, get help right away.  After your blood sugar goes back to normal, eat a meal or a snack within 1 hour. Treating Very Low Blood Sugar  If your blood sugar is at or below 54 mg/dL (3 mmol/L), you have very low blood sugar (severe hypoglycemia). This is an emergency. Do not wait to see if the symptoms will go away. Get medical help right away. Call your local emergency services (911 in the [place] Do not drive yourself to the hospital. If you have very low blood sugar and you cannot eat or drink, you may need a glucagon shot (injection). A family member or friend should learn how to check your blood sugar and how to give you a glucagon shot. Ask your doctor if you need to have a glucagon shot kit at home. Follow these instructions at home: General instructions   Avoid any diets that cause you to not eat enough food. Talk with your doctor before you start any new diet.  Take over-the-counter and prescription medicines only as told by your doctor.  Limit alcohol to no more than 1 drink per day for nonpregnant women and 2 drinks per day for men. One drink equals 12 oz of beer, 5 oz of wine, or 1 oz of hard liquor.  Keep all follow-up visits as told by your doctor. This is important. If You Have Diabetes:    Make sure you know the  symptoms of low blood sugar.  Always keep a source of sugar with you, such as:  Sugar.  Sugar tablets.  Glucose gel.  Fruit juice.  Regular soda (not diet soda).  Milk.  Hard candy.  Honey.  Take your medicines as told.  Follow your exercise and meal plan.  Eat on time. Do not skip meals.  Follow your sick day plan when you cannot eat or drink normally. Make this plan ahead of time with your doctor.  Check your blood sugar as often as told by your doctor. Always check before and after exercise.  Share your diabetes care plan with:  Your work or school.  People you live with.  Check your pee (urine) for ketones:  When you are  sick.  As told by your doctor.  Carry a card or wear jewelry that says you have diabetes. If You Have Low Blood Sugar From Other Causes:    Check your blood sugar as often as told by your doctor.  Follow instructions from your doctor about what you cannot eat or drink. Contact a doctor if:  You have trouble keeping your blood sugar in your target range.  You have low blood sugar often. Get help right away if:  You still have symptoms after you eat or drink something sugary.  Your blood sugar is at or below 54 mg/dL (3 mmol/L).  You have jerky movements that you cannot control.  You pass out. These symptoms may be an emergency. Do not wait to see if the symptoms will go away. Get medical help right away. Call your local emergency services (911 in the [place] Do not drive yourself to the hospital. This information is not intended to replace advice given to you by your health care provider. Make sure you discuss any questions you have with your health care provider. Document Released: 05/14/2009 Document Revised: 07/26/2015 Document Reviewed: 03/23/2015 Elsevier Interactive Patient Education  2017 [employer].

## 2016-07-04 ENCOUNTER — Other Ambulatory Visit: Payer: Self-pay | Admitting: Internal Medicine

## 2016-07-04 MED FILL — MELOXICAM 15 MG TABLET: 15 | 30 days supply | Qty: 30 | Fill #1

## 2016-07-04 MED FILL — PIOGLITAZONE HCL 30 MG TAB: 30 | 30 days supply | Qty: 15 | Fill #3

## 2016-07-04 MED FILL — PRAVASTATIN NA 40 MG TAB: 40 | 30 days supply | Qty: 30 | Fill #3

## 2016-07-09 MED FILL — ?METFORMIN HCL 1,000 MG TAB: 1000 | 30 days supply | Qty: 60 | Fill #0

## 2016-07-17 ENCOUNTER — Encounter: Payer: Self-pay | Admitting: Internal Medicine

## 2016-07-18 ENCOUNTER — Encounter: Payer: Self-pay | Admitting: Internal Medicine

## 2016-07-21 ENCOUNTER — Encounter: Payer: Self-pay | Admitting: Internal Medicine

## 2016-07-29 MED FILL — TRUE METRIX TEST STRIP: 100 days supply | Qty: 100 | Fill #1

## 2016-07-29 MED FILL — GABAPENTIN 300 MG CAPSULE: 300 | 30 days supply | Qty: 90 | Fill #0

## 2016-07-29 MED FILL — TRUEPLUS SYR 0.5ML 30GX5/16: 30G X 5/16" | 25 days supply | Qty: 100 | Fill #1

## 2016-07-29 MED FILL — $novoLOG 100 UNITS/ML VIAL: 100 | 43 days supply | Qty: 20 | Fill #1

## 2016-08-04 MED FILL — PIOGLITAZONE HCL 30 MG TAB: 30 | 30 days supply | Qty: 15 | Fill #4

## 2016-08-04 MED FILL — metFORMIN HCL 1000 MG TABS: 1000 | 30 days supply | Qty: 60 | Fill #1

## 2016-08-04 MED FILL — PRAVASTATIN NA 40 MG TAB: 40 | 30 days supply | Qty: 30 | Fill #0

## 2016-08-04 MED FILL — MELOXICAM 15 MG TABLET: 15 | 30 days supply | Qty: 30 | Fill #2

## 2016-08-11 ENCOUNTER — Ambulatory Visit: Payer: Self-pay | Admitting: Family Medicine

## 2016-08-14 ENCOUNTER — Other Ambulatory Visit: Payer: Self-pay

## 2016-08-14 ENCOUNTER — Ambulatory Visit: Payer: Self-pay | Attending: Family Medicine | Admitting: Family Medicine

## 2016-08-14 ENCOUNTER — Ambulatory Visit (HOSPITAL_COMMUNITY)
Admission: RE | Admit: 2016-08-14 | Discharge: 2016-08-14 | Disposition: A | Discharge status: Home / Self Care | Payer: Self-pay | Source: Ambulatory Visit | Attending: Family Medicine | Admitting: Family Medicine

## 2016-08-14 VITALS — BP 122/70 | HR 82 | Temp 98.1°F | Resp 18 | Ht 75.0 in | Wt >= 6400 oz

## 2016-08-14 DIAGNOSIS — Z7982 Long term (current) use of aspirin: Secondary | ICD-10-CM | POA: Insufficient documentation

## 2016-08-14 DIAGNOSIS — E1142 Type 2 diabetes mellitus with diabetic polyneuropathy: Secondary | ICD-10-CM

## 2016-08-14 DIAGNOSIS — Z794 Long term (current) use of insulin: Secondary | ICD-10-CM | POA: Insufficient documentation

## 2016-08-14 DIAGNOSIS — S76119A Strain of unspecified quadriceps muscle, fascia and tendon, initial encounter: Secondary | ICD-10-CM

## 2016-08-14 DIAGNOSIS — E119 Type 2 diabetes mellitus without complications: Secondary | ICD-10-CM | POA: Insufficient documentation

## 2016-08-14 DIAGNOSIS — E785 Hyperlipidemia, unspecified: Secondary | ICD-10-CM

## 2016-08-14 DIAGNOSIS — R609 Edema, unspecified: Secondary | ICD-10-CM

## 2016-08-14 DIAGNOSIS — I4510 Unspecified right bundle-branch block: Secondary | ICD-10-CM

## 2016-08-14 DIAGNOSIS — Z79899 Other long term (current) drug therapy: Secondary | ICD-10-CM | POA: Insufficient documentation

## 2016-08-14 DIAGNOSIS — I10 Essential (primary) hypertension: Secondary | ICD-10-CM

## 2016-08-14 LAB — POCT UA - MICROALBUMIN
Albumin/Creatinine Ratio, Urine, POC: <30
Creatinine, POC: 200 mg/dL
Microalbumin Ur, POC: 10 mg/L

## 2016-08-14 LAB — GLUCOSE, POCT (MANUAL RESULT ENTRY): POC GLUCOSE: 186 mg/dL — AB (ref 70–99)

## 2016-08-14 LAB — POCT GLYCOSYLATED HEMOGLOBIN (HGB A1C): Hemoglobin A1C: 8

## 2016-08-14 MED ORDER — GLIPIZIDE 5 MG PO TABS: 2.5000 mg | ORAL_TABLET | Freq: Two times a day (BID) | ORAL | 2 refills | Status: DC

## 2016-08-14 MED ORDER — GLUCOSE BLOOD VI STRP: ORAL_STRIP | 12 refills | Status: DC

## 2016-08-14 MED ORDER — ACETAMINOPHEN 500 MG PO TABS: 1000.0000 mg | ORAL_TABLET | Freq: Four times a day (QID) | ORAL | 0 refills | Status: DC | PRN

## 2016-08-14 MED FILL — TRUE METRIX TEST STRIP: 30 days supply | Qty: 100 | Fill #0

## 2016-08-14 MED FILL — ?GLIPIZIDE 5MG TABLET: 5 | 30 days supply | Qty: 30 | Fill #0

## 2016-08-14 NOTE — Progress Notes (Signed)
Patient is here for f/up  

## 2016-08-14 NOTE — Patient Instructions (Addendum)
Quadriceps Strain A quadriceps strain is an injury to the muscles or tendons on the front of the thigh. The quadriceps muscles are used in straightening the knee and bending the hip. A strain occurs when the muscle is overstretched or overloaded. There are three types of strains:  Grade 1 is a mild strain. It involves a stretching or minor tearing of your muscle fibers or tendons. You should have little, if any, trouble using your thigh.  Grade 2 is a moderate strain. It involves a partial tearing of your muscle fibers or tendons. You will have pain and some loss of strength in your thigh.  Grade 3 is a severe strain. It involves a complete tearing of your muscle fibers or tendon. It causes severe pain and loss of strength in your thigh.  Recovery will take a few weeks or longer, depending on how bad your strain is. What are the causes? This injury is caused by overextending the muscles in the thigh. What increases the risk? The following factors may make you more likely to develop this injury:  Participating in: ? Activities that involve jumping or sprinting. ? Contact sports, such as football or soccer.  Having a previous injury to your thigh or knee.  Having poor strength and flexibility.  Not warming up properly before activity.  Having one leg that is much stronger than the other.  Exercising to the point of exhaustion.  What are the signs or symptoms? Symptoms of this condition include:  Sudden, severe pain in your thigh.  Pain and tenderness over your quadriceps muscles. The pain gets worse when you use these muscles.  Muscle spasm in your thigh.  Bruising.  Having trouble with tasks that involve using your quadriceps muscle, such as walking.  A crackling sound when the tendon is moved or touched.  How is this diagnosed? This condition may be diagnosed based on your symptoms, your medical history, and a physical exam. Your health care provider may order tests such  as an X-ray, ultrasound, or MRI to further evaluate your injury and to rule out other conditions. How is this treated? Treatment for this condition may include:  Resting your leg and avoiding activities that cause pain.  Taking medicine to help reduce pain and inflammation.  Applying ice to the area to relieve swelling and inflammation.  Using crutches until you can walk without pain.  Working with a physical therapist on exercises to restore strength and flexibility in your thigh.  In rare cases, surgery may be needed. Follow these instructions at home: Managing pain, stiffness, and swelling  If directed, put ice on the injured area: ? Put ice in a plastic bag. ? Place a towel between your skin and the bag. ? Leave the ice on for 20 minutes, 2-3 times a day.  Raise (elevate) the injured area above the level of your heart while you are sitting or lying down. Activity  Do not use the injured limb to support your body weight until your health care provider says that you can. Use crutches as told by your health care provider.  Do exercises as told by your health care provider.  Return to your normal activities as told by your health care provider. Ask your health care provider what activities are safe for you. General instructions  Take over-the-counter and prescription medicines only as told by your health care provider.  Keep all follow-up visits as told by your health care provider. This is important. How is this prevented?    Warm up and stretch before being active.  Cool down and stretch after being active.  Give your body time to rest between periods of activity.  Make sure to use equipment that fits you.  Be safe and responsible while being active to avoid falls.  Do at least 150 minutes of moderate-intensity exercise each week, such as brisk walking or water aerobics.  Maintain physical fitness, including: ? Strength ? Flexibility. ? Cardiovascular  fitness. ? Endurance. Contact a health care provider if:  Your pain, bruising, or tenderness gets worse, even with treatment.  Your leg becomes weaker. This information is not intended to replace advice given to you by your health care provider. Make sure you discuss any questions you have with your health care provider. Document Released: 02/17/2005 Document Revised: 10/23/2015 Document Reviewed: 11/21/2014 Elsevier Interactive Patient Education  2018 [employer]    Edema Edema is when you have too much fluid in your body or under your skin. Edema may make your legs, feet, and ankles swell up. Swelling is also common in looser tissues, like around your eyes. This is a common condition. It gets more common as you get older. There are many possible causes of edema. Eating too much salt (sodium) and being on your feet or sitting for a long time can cause edema in your legs, feet, and ankles. Hot weather may make edema worse. Edema is usually painless. Your skin may look swollen or shiny. Follow these instructions at home: Keep the swollen body part raised (elevated) above the level of your heart when you are sitting or lying down. Do not sit still or stand for a long time. Do not wear tight clothes. Do not wear garters on your upper legs. Exercise your legs. This can help the swelling go down. Wear elastic bandages or support stockings as told by your doctor. Eat a low-salt (low-sodium) diet to reduce fluid as told by your doctor. Depending on the cause of your swelling, you may need to limit how much fluid you drink (fluid restriction). Take over-the-counter and prescription medicines only as told by your doctor. Contact a doctor if: Treatment is not working. You have heart, liver, or kidney disease and have symptoms of edema. You have sudden and unexplained weight gain. Get help right away if: You have shortness of breath or chest pain. You cannot breathe when you lie down. You  have pain, redness, or warmth in the swollen areas. You have heart, liver, or kidney disease and get edema all of a sudden. You have a fever and your symptoms get worse all of a sudden. Summary Edema is when you have too much fluid in your body or under your skin. Edema may make your legs, feet, and ankles swell up. Swelling is also common in looser tissues, like around your eyes. Raise (elevate) the swollen body part above the level of your heart when you are sitting or lying down. Follow your doctor's instructions about diet and how much fluid you can drink (fluid restriction). This information is not intended to replace advice given to you by your health care provider. Make sure you discuss any questions you have with your health care provider. Document Released: 08/06/2007 Document Revised: 03/07/2016 Document Reviewed: 03/07/2016 Elsevier Interactive Patient Education  2017 [employer]

## 2016-08-14 NOTE — Progress Notes (Signed)
Subjective:  Patient ID: Dillon Mcgee, male    DOB: [birth year]  Age: 56 y.o. MRN: [id number]  CC: Diabetes   HPI Dillon Mcgee presents to establish care and for 2 month follow up for diabetes. PMH of DM, HTN, and HLD. History of diabetes mellitus. Patient denies foot ulcerations, hyperglycemia, nausea, paresthesia of the feet, polydipsia, polyuria, visual disturbances and vomitting.  Evaluation to date has been included: fasting blood sugar, fasting lipid panel, hemoglobin A1C and microalbuminuria.  Home sugars: BGs are running  consistent with Hgb A1C. Treatment to date: Continued insulin which has been effective, Continued metformin which has been effective and Continued Actos which has been effective. He does reports bilateral lower extremity edema. He reports suspecting edema from pioglitazone use. History of hypertension and hyperlipidemia.He is not exercising and is not adherent to low salt diet. He doesn't check his BP at home. Cardiac symptoms none. Patient denies chest pain, chest pressure/discomfort, claudication, dyspnea, lower extremity edema, near-syncope, orthopnea, palpitations and syncope.  Cardiovascular risk factors: diabetes mellitus, dyslipidemia, family history of premature cardiovascular disease, hypertension, male gender, obesity (BMI >= 30 kg/m2) and sedentary lifestyle. Use of agents associated with hypertension: none. History of target organ damage: none.  Outpatient Medications Prior to Visit  Medication Sig Dispense Refill  . aspirin EC 81 MG tablet Take 1 tablet (81 mg total) by mouth daily. 90 tablet 2  . Blood Glucose Monitoring Suppl (TRUE METRIX GO GLUCOSE METER) w/Device KIT 1 each by Does not apply route every 8 (eight) hours as needed. 1 kit 0  . ferrous sulfate 325 (65 FE) MG tablet Take 325 mg by mouth daily with breakfast.    . gabapentin (NEURONTIN) 300 MG capsule Take 1 capsule (300 mg total) by mouth 3 (three) times daily. 90 capsule 3  . insulin  aspart (NOVOLOG) 100 UNIT/ML injection Inject 15 Units into the skin 3 (three) times daily with meals. 60 mL 3  . insulin detemir (LEVEMIR) 100 UNIT/ML injection Inject 0.18 mLs (18 Units total) into the skin at bedtime. 30 mL 3  . Insulin Syringe-Needle U-100 (TRUEPLUS INSULIN SYRINGE) 30G X 5/16" 1 ML MISC Use as directed 4 times daily 300 each 3  . lisinopril-hydrochlorothiazide (PRINZIDE,ZESTORETIC) 10-12.5 MG tablet Take 1 tablet by mouth daily. 90 tablet 3  . meloxicam (MOBIC) 15 MG tablet Take 1 tablet (15 mg total) by mouth daily. Take with food 90 tablet 3  . metFORMIN (GLUCOPHAGE) 1000 MG tablet Take 1 tablet (1,000 mg total) by mouth 2 (two) times daily with a meal. 180 tablet 3  . pravastatin (PRAVACHOL) 40 MG tablet Take 1 tablet (40 mg total) by mouth daily. 90 tablet 3  . triamcinolone cream (KENALOG) 0.1 % Apply 1 application topically 2 (two) times daily. 30 g 2  . TRUEPLUS LANCETS 26G MISC 1 each by Does not apply route every 8 (eight) hours as needed. 100 each 12  . Vitamin D, Ergocalciferol, (DRISDOL) 50000 UNITS CAPS capsule Take 1 capsule (50,000 Units total) by mouth every 7 (seven) days. (Patient not taking: Reported on 01/01/2016) 12 capsule 0  . glucose blood test strip Use as instructed 100 each 12  . ibuprofen (ADVIL,MOTRIN) 600 MG tablet Take 1 tablet (600 mg total) by mouth every 8 (eight) hours as needed. 30 tablet 1  . pioglitazone (ACTOS) 30 MG tablet Take 0.5 tablets (15 mg total) by mouth daily. 45 tablet 3   No facility-administered medications prior to visit.     ROS Review of  Systems  Constitutional: Negative.   Eyes: Negative.   Respiratory: Negative.   Cardiovascular: Positive for leg swelling.  Gastrointestinal: Negative.   Skin: Negative.   Neurological: Negative.   Psychiatric/Behavioral: Negative.     Objective:  BP 122/70 (BP Location: Left Arm, Patient Position: Sitting, Cuff Size: Normal)   Pulse 82   Temp 98.1 F (36.7 C) (Oral)   Resp  18   Ht '6\' 3"'  (1.905 m)   Wt (!) 403 lb (182.8 kg)   SpO2 91%   BMI 50.37 kg/m   BP/Weight 08/14/2016 06/24/2016 1/50/5697  Systolic BP 948 016 553  Diastolic BP 70 82 87  Wt. (Lbs) 403 - 397.2  BMI 50.37 - 49.65    Physical Exam  Constitutional: He appears well-developed and well-nourished.  obese  Eyes: Conjunctivae are normal. Pupils are equal, round, and reactive to light.  Neck: No JVD present.  Cardiovascular: Normal rate, regular rhythm, normal heart sounds and intact distal pulses.   Pulmonary/Chest: Effort normal and breath sounds normal.  No adventitious lung sounds.   Abdominal: Soft. Bowel sounds are normal.  Skin: Skin is warm and dry.  BLE pitting edema 2+ Skin to bilateral feet wnl. No wounds present.  Psychiatric: He has a normal mood and affect.  Nursing note and vitals reviewed.   Assessment & Plan:   Problem List Items Addressed This Visit      Cardiovascular and Mediastinum   Essential hypertension   Relevant Orders   Lipid Panel (Completed)   POCT UA - Microalbumin (Completed)   CMP14+EGFR (Completed)     Other   Hyperlipidemia   Relevant Orders   Lipid Panel (Completed)    Other Visit Diagnoses    Type 2 diabetes mellitus with diabetic polyneuropathy, with long-term current use of insulin ([place])    -  Primary   D/C actos. Start glipizide.   Relevant Medications   glipiZIDE (GLUCOTROL) 5 MG tablet   glucose blood test strip   Other Relevant Orders   Lipid Panel (Completed)   POCT glycosylated hemoglobin (Hb A1C) (Completed)   Glucose (CBG) (Completed)   POCT UA - Microalbumin (Completed)   CMP14+EGFR (Completed)   Pitting edema       Relevant Orders   Brain natriuretic peptide (Completed)   EKG 12-Lead   DG Chest 2 View (Completed)   ECHOCARDIOGRAM COMPLETE   Strain of quadriceps muscle, unspecified laterality, initial encounter       Relevant Medications   acetaminophen (TYLENOL) 500 MG tablet   Obesity, morbid, BMI 50 or higher  (HCC)       Relevant Medications   glipiZIDE (GLUCOTROL) 5 MG tablet   Other Relevant Orders   ECHOCARDIOGRAM COMPLETE   Right bundle branch block (RBBB)       Relevant Orders   ECHOCARDIOGRAM COMPLETE      Meds ordered this encounter  Medications  . glipiZIDE (GLUCOTROL) 5 MG tablet    Sig: Take 0.5 tablets (2.5 mg total) by mouth 2 (two) times daily before a meal.    Dispense:  30 tablet    Refill:  2    Order Specific Question:   Supervising Provider    Answer:   Tresa Garter [id number]  . glucose blood test strip    Sig: Use as instructed    Dispense:  100 each    Refill:  12    Order Specific Question:   Supervising Provider    Answer:   Tresa Garter [[id number]]  .  acetaminophen (TYLENOL) 500 MG tablet    Sig: Take 2 tablets (1,000 mg total) by mouth every 6 (six) hours as needed for moderate pain.    Dispense:  30 tablet    Refill:  0    Order Specific Question:   Supervising Provider    Answer:   Tresa Garter [id number]    Follow-up: Return in about 1 month (around 09/13/2016), or if symptoms worsen or fail to improve, for DM .   Alfonse Spruce FNP

## 2016-08-15 LAB — CMP14+EGFR
ALBUMIN: 3.9 g/dL (ref 3.5–5.5)
ALT: 16 IU/L (ref 0–44)
AST: 19 IU/L (ref 0–40)
Albumin/Globulin Ratio: 1.7 (ref 1.2–2.2)
Alkaline Phosphatase: 70 IU/L (ref 39–117)
BUN / CREAT RATIO: 17 (ref 9–20)
BUN: 18 mg/dL (ref 6–24)
CHLORIDE: 106 mmol/L (ref 96–106)
CO2: 26 mmol/L (ref 20–29)
CREATININE: 1.04 mg/dL (ref 0.76–1.27)
Calcium: 9.4 mg/dL (ref 8.7–10.2)
GFR calc non Af Amer: 81 mL/min/{1.73_m2} (ref >59)
GFR, EST AFRICAN AMERICAN: 94 mL/min/{1.73_m2} (ref >59)
GLOBULIN, TOTAL: 2.3 g/dL (ref 1.5–4.5)
GLUCOSE: 113 mg/dL — AB (ref 65–99)
Potassium: 5.4 mmol/L — ABNORMAL HIGH (ref 3.5–5.2)
SODIUM: 146 mmol/L — AB (ref 134–144)
TOTAL PROTEIN: 6.2 g/dL (ref 6.0–8.5)

## 2016-08-15 LAB — LIPID PANEL
CHOL/HDL RATIO: 3.5 ratio (ref 0.0–5.0)
Cholesterol, Total: 154 mg/dL (ref 100–199)
HDL: 44 mg/dL (ref >39)
LDL CALC: 79 mg/dL (ref 0–99)
TRIGLYCERIDES: 155 mg/dL — AB (ref 0–149)
VLDL CHOLESTEROL CAL: 31 mg/dL (ref 5–40)

## 2016-08-15 LAB — BRAIN NATRIURETIC PEPTIDE: BNP: 6.2 pg/mL (ref 0.0–100.0)

## 2016-08-21 ENCOUNTER — Other Ambulatory Visit: Payer: Self-pay | Admitting: Family Medicine

## 2016-08-25 ENCOUNTER — Telehealth: Payer: Self-pay | Admitting: Family Medicine

## 2016-08-25 ENCOUNTER — Other Ambulatory Visit: Payer: Self-pay | Admitting: Family Medicine

## 2016-08-25 DIAGNOSIS — E785 Hyperlipidemia, unspecified: Principal | ICD-10-CM

## 2016-08-25 DIAGNOSIS — E1169 Type 2 diabetes mellitus with other specified complication: Secondary | ICD-10-CM

## 2016-08-25 MED ORDER — PRAVASTATIN SODIUM 80 MG PO TABS: 80.0000 mg | ORAL_TABLET | Freq: Every day | ORAL | 3 refills | Status: DC

## 2016-08-26 MED FILL — PRAVASTATIN NA 80 MG TAB: 80 | 30 days supply | Qty: 30 | Fill #0

## 2016-08-29 ENCOUNTER — Ambulatory Visit (HOSPITAL_COMMUNITY)
Admission: RE | Admit: 2016-08-29 | Discharge: 2016-08-29 | Disposition: A | Discharge status: Home / Self Care | Payer: Self-pay | Source: Ambulatory Visit | Attending: Family Medicine | Admitting: Family Medicine

## 2016-08-29 DIAGNOSIS — R609 Edema, unspecified: Secondary | ICD-10-CM | POA: Insufficient documentation

## 2016-08-29 DIAGNOSIS — I4510 Unspecified right bundle-branch block: Secondary | ICD-10-CM | POA: Insufficient documentation

## 2016-08-29 NOTE — Progress Notes (Signed)
  Echocardiogram 2D Echocardiogram has been performed.  Pieter PartridgeBrooke S Teneisha Gignac 08/29/2016, 8:46 AM

## 2016-09-01 MED FILL — $LEVEMIR 100U/ML VIAL: 100 | 30 days supply | Qty: 10 | Fill #1

## 2016-09-01 NOTE — Telephone Encounter (Signed)
CMA call regarding lab results   Patient verify DOB  Patient was aware and understood  

## 2016-09-01 NOTE — Telephone Encounter (Signed)
-----   Message from Lizbeth BarkMandesia R Hairston, FNP sent at 08/25/2016  5:43 PM EDT ----- CXR was normal. Heart size is normal. Lungs clear no fluid or mass. Labs normal. Potassium and glucose mildly elevated. BNP lab is normal. When elevated can indicate heart failure.  Triglyceride levels are elevated this can increase your risk of heart disease. Your dose of pravastatin will be increased. Recommend follow up in 3 months.

## 2016-09-08 ENCOUNTER — Telehealth: Payer: Self-pay

## 2016-09-08 MED FILL — glipiZIDE 5 MG TABS: 5 | 30 days supply | Qty: 30 | Fill #1

## 2016-09-08 MED FILL — GABAPENTIN 300 MG CAPSULE: 300 | 30 days supply | Qty: 90 | Fill #1

## 2016-09-08 MED FILL — PRAVASTATIN NA 40 MG TAB: 40 | 30 days supply | Qty: 30 | Fill #1

## 2016-09-08 MED FILL — ?MELOXICAM 15MG TABLET: 15 | 30 days supply | Qty: 30 | Fill #3

## 2016-09-08 MED FILL — ?METFORMIN HCL 1,000 MG TAB: 1000 | 30 days supply | Qty: 60 | Fill #2

## 2016-09-08 NOTE — Telephone Encounter (Signed)
CMA cal regarding lab results  Patient verify DOB  Patient was aware and understood

## 2016-09-08 NOTE — Telephone Encounter (Signed)
-----   Message from Lizbeth BarkMandesia R Hairston, FNP sent at 09/08/2016  9:53 AM EDT ----- -Echocardiogram which looks at your heart structure and function showed normal pumping ability of the heart. Left side of heart showed mild wall thickening. This can occur when hypertension is not well controlled overtime. There is no narrowing of the heart valves or fluid around the heart. Continue to take your medications for blood pressure control. Start eating a diet low in saturated fat. Limit your intake of fried foods, red meats, and whole milk.  Increase physical activity, lose weight,  goal is exercising at least 3-5 times per week for 30 minutes.

## 2016-09-19 ENCOUNTER — Encounter: Payer: Self-pay | Admitting: Family Medicine

## 2016-09-19 ENCOUNTER — Ambulatory Visit: Payer: Self-pay | Attending: Family Medicine | Admitting: Family Medicine

## 2016-09-19 VITALS — BP 126/73 | HR 78 | Temp 98.3°F | Resp 18 | Ht 76.0 in | Wt 395.4 lb

## 2016-09-19 DIAGNOSIS — E785 Hyperlipidemia, unspecified: Secondary | ICD-10-CM | POA: Insufficient documentation

## 2016-09-19 DIAGNOSIS — Z8249 Family history of ischemic heart disease and other diseases of the circulatory system: Secondary | ICD-10-CM | POA: Insufficient documentation

## 2016-09-19 DIAGNOSIS — E1142 Type 2 diabetes mellitus with diabetic polyneuropathy: Secondary | ICD-10-CM | POA: Insufficient documentation

## 2016-09-19 DIAGNOSIS — I10 Essential (primary) hypertension: Secondary | ICD-10-CM | POA: Insufficient documentation

## 2016-09-19 DIAGNOSIS — Z794 Long term (current) use of insulin: Secondary | ICD-10-CM | POA: Insufficient documentation

## 2016-09-19 DIAGNOSIS — Z7982 Long term (current) use of aspirin: Secondary | ICD-10-CM | POA: Insufficient documentation

## 2016-09-19 LAB — GLUCOSE, POCT (MANUAL RESULT ENTRY): POC Glucose: 86 mg/dl (ref 70–99)

## 2016-09-19 NOTE — Progress Notes (Signed)
Subjective:  Patient ID: Dillon Mcgee, male    DOB: March 28, 1960  Age: 56 y.o. MRN: [id number]  CC: Diabetes   HPI Dillon Mcgee presents for follow up for diabetes. PMH of DM, HTN, and HLD. History of diabetes mellitus. Patient denies foot ulcerations, hyperglycemia, nausea, paresthesia of the feet, polydipsia, polyuria, visual disturbances and vomitting.  Evaluation to date has been included: fasting blood sugar, fasting lipid panel, hemoglobin A1C and microalbuminuria. He brings a log of his CBG with him. Home sugars: BGs lowest 79, highest 209 . Treatment to date: Continued insulin which has been effective, Continued metformin which has been effective. History of hypertension and hyperlipidemia.He is now incorporating walking for exercise and has made dietary changes. diet. He reports losing 8 lbs so fa. Cardiac symptoms none. Patient denies chest pain, chest pressure/discomfort, claudication, dyspnea, lower extremity edema, near-syncope, orthopnea, palpitations and syncope.  Cardiovascular risk factors: diabetes mellitus, dyslipidemia, family history of premature cardiovascular disease, hypertension, male gender, obesity (BMI >= 30 kg/m2) and sedentary lifestyle. Use of agents associated with hypertension: none. History of target organ damage: none.   Outpatient Medications Prior to Visit  Medication Sig Dispense Refill  . acetaminophen (TYLENOL) 500 MG tablet Take 2 tablets (1,000 mg total) by mouth every 6 (six) hours as needed for moderate pain. 30 tablet 0  . aspirin EC 81 MG tablet Take 1 tablet (81 mg total) by mouth daily. 90 tablet 2  . Blood Glucose Monitoring Suppl (TRUE METRIX GO GLUCOSE METER) w/Device KIT 1 each by Does not apply route every 8 (eight) hours as needed. 1 kit 0  . ferrous sulfate 325 (65 FE) MG tablet Take 325 mg by mouth daily with breakfast.    . gabapentin (NEURONTIN) 300 MG capsule Take 1 capsule (300 mg total) by mouth 3 (three) times daily. 90 capsule 3  .  glipiZIDE (GLUCOTROL) 5 MG tablet Take 0.5 tablets (2.5 mg total) by mouth 2 (two) times daily before a meal. 30 tablet 2  . glucose blood test strip Use as instructed 100 each 12  . insulin aspart (NOVOLOG) 100 UNIT/ML injection Inject 15 Units into the skin 3 (three) times daily with meals. 60 mL 3  . insulin detemir (LEVEMIR) 100 UNIT/ML injection Inject 0.18 mLs (18 Units total) into the skin at bedtime. 30 mL 3  . Insulin Syringe-Needle U-100 (TRUEPLUS INSULIN SYRINGE) 30G X 5/16" 1 ML MISC Use as directed 4 times daily 300 each 3  . lisinopril-hydrochlorothiazide (PRINZIDE,ZESTORETIC) 10-12.5 MG tablet Take 1 tablet by mouth daily. 90 tablet 3  . meloxicam (MOBIC) 15 MG tablet Take 1 tablet (15 mg total) by mouth daily. Take with food 90 tablet 3  . metFORMIN (GLUCOPHAGE) 1000 MG tablet Take 1 tablet (1,000 mg total) by mouth 2 (two) times daily with a meal. 180 tablet 3  . pravastatin (PRAVACHOL) 80 MG tablet Take 1 tablet (80 mg total) by mouth daily. 90 tablet 3  . triamcinolone cream (KENALOG) 0.1 % Apply 1 application topically 2 (two) times daily. 30 g 2  . TRUEPLUS LANCETS 26G MISC 1 each by Does not apply route every 8 (eight) hours as needed. 100 each 12  . Vitamin D, Ergocalciferol, (DRISDOL) 50000 UNITS CAPS capsule Take 1 capsule (50,000 Units total) by mouth every 7 (seven) days. (Patient not taking: Reported on 01/01/2016) 12 capsule 0   No facility-administered medications prior to visit.     ROS Review of Systems  Constitutional: Negative.   Eyes: Negative.  Respiratory: Negative.   Cardiovascular: Negative.   Gastrointestinal: Negative.   Skin: Negative.   Neurological: Negative.    Objective:  BP 126/73 (BP Location: Left Arm, Patient Position: Sitting, Cuff Size: Normal)   Pulse 78   Temp 98.3 F (36.8 C) (Oral)   Resp 18   Ht _0  (1.93 m)   Wt (!) 395 lb 6.4 oz (179.4 kg)   SpO2 95%   BMI 48.13 kg/m   BP/Weight 09/19/2016 08/14/2016 7/97/2820    Systolic BP 601 561 537  Diastolic BP 73 70 82  Wt. (Lbs) 395.4 403 -  BMI 48.13 50.37 -   Physical Exam  Constitutional: He appears well-developed and well-nourished.  Eyes: Pupils are equal, round, and reactive to light. Conjunctivae are normal.  Neck: Normal range of motion. Neck supple. No JVD present.  Cardiovascular: Normal rate, regular rhythm, normal heart sounds and intact distal pulses.   Pulmonary/Chest: Effort normal and breath sounds normal.  Abdominal: Soft. Bowel sounds are normal. There is no tenderness.  Neurological: He is alert.  Skin: Skin is warm and dry.  Skin to bilateral feet and legs, wnl. No wounds present.  Psychiatric: He has a normal mood and affect.  Nursing note and vitals reviewed.  Assessment & Plan:   Problem List Items Addressed This Visit      Cardiovascular and Mediastinum   Essential hypertension     BP well controlled on current medications   Other   Hyperlipidemia        Adherent with statin use. Recheck lipids in 3 months.  Other Visit Diagnoses    Type 2 diabetes mellitus with diabetic polyneuropathy, with long-term current use of insulin ([facility])    -  Primary   Pt.adherent with CBG TID checks and diabetic medications   Continue dietary interventions.   Follow up in 3 month with PCP   Relevant Orders   Glucose (CBG) (Completed)       Follow-up: Return in about 3 months (around 12/20/2016) for DM.   Alfonse Spruce FNP

## 2016-09-19 NOTE — Progress Notes (Signed)
Patient is here for f/up   Patient has eaten today   Patient has taking his Meds

## 2016-09-19 NOTE — Patient Instructions (Signed)
Continue dietary changes and incorporating exercise.   Diabetes Mellitus and Exercise Exercising regularly is important for your overall health, especially when you have diabetes (diabetes mellitus). Exercising is not only about losing weight. It has many health benefits, such as increasing muscle strength and bone density and reducing body fat and stress. This leads to improved fitness, flexibility, and endurance, all of which result in better overall health. Exercise has additional benefits for people with diabetes, including:  Reducing appetite.  Helping to lower and control blood glucose.  Lowering blood pressure.  Helping to control amounts of fatty substances (lipids) in the blood, such as cholesterol and triglycerides.  Helping the body to respond better to insulin (improving insulin sensitivity).  Reducing how much insulin the body needs.  Decreasing the risk for heart disease by: ? Lowering cholesterol and triglyceride levels. ? Increasing the levels of good cholesterol. ? Lowering blood glucose levels.  What is my activity plan? Your health care provider or certified diabetes educator can help you make a plan for the type and frequency of exercise (activity plan) that works for you. Make sure that you:  Do at least 150 minutes of moderate-intensity or vigorous-intensity exercise each week. This could be brisk walking, biking, or water aerobics. ? Do stretching and strength exercises, such as yoga or weightlifting, at least 2 times a week. ? Spread out your activity over at least 3 days of the week.  Get some form of physical activity every day. ? Do not go more than 2 days in a row without some kind of physical activity. ? Avoid being inactive for more than 90 minutes at a time. Take frequent breaks to walk or stretch.  Choose a type of exercise or activity that you enjoy, and set realistic goals.  Start slowly, and gradually increase the intensity of your exercise over  time.  What do I need to know about managing my diabetes?  Check your blood glucose before and after exercising. ? If your blood glucose is higher than 240 mg/dL (78.213.3 mmol/L) before you exercise, check your urine for ketones. If you have ketones in your urine, do not exercise until your blood glucose returns to normal.  Know the symptoms of low blood glucose (hypoglycemia) and how to treat it. Your risk for hypoglycemia increases during and after exercise. Common symptoms of hypoglycemia can include: ? Hunger. ? Anxiety. ? Sweating and feeling clammy. ? Confusion. ? Dizziness or feeling light-headed. ? Increased heart rate or palpitations. ? Blurry vision. ? Tingling or numbness around the mouth, lips, or tongue. ? Tremors or shakes. ? Irritability.  Keep a rapid-acting carbohydrate snack available before, during, and after exercise to help prevent or treat hypoglycemia.  Avoid injecting insulin into areas of the body that are going to be exercised. For example, avoid injecting insulin into: ? The arms, when playing tennis. ? The legs, when jogging.  Keep records of your exercise habits. Doing this can help you and your health care provider adjust your diabetes management plan as needed. Write down: ? Food that you eat before and after you exercise. ? Blood glucose levels before and after you exercise. ? The type and amount of exercise you have done. ? When your insulin is expected to peak, if you use insulin. Avoid exercising at times when your insulin is peaking.  When you start a new exercise or activity, work with your health care provider to make sure the activity is safe for you, and to adjust your  insulin, medicines, or food intake as needed.  Drink plenty of water while you exercise to prevent dehydration or heat stroke. Drink enough fluid to keep your urine clear or pale yellow. This information is not intended to replace advice given to you by your health care provider.  Make sure you discuss any questions you have with your health care provider. Document Released: 05/10/2003 Document Revised: 09/07/2015 Document Reviewed: 07/30/2015 Elsevier Interactive Patient Education  2018 [employer].

## 2016-09-29 MED FILL — LISINOPRIL-HCTZ 10-12.5 MG: 10-12.5 | 90 days supply | Qty: 90 | Fill #1

## 2016-10-02 MED FILL — ?MELOXICAM 15MG TABLET: 15 | 30 days supply | Qty: 30 | Fill #4

## 2016-10-02 MED FILL — $novoLOG 100 UNITS/ML VIAL: 100 | 43 days supply | Qty: 20 | Fill #2

## 2016-10-02 MED FILL — TRUE METRIX TEST STRIP: 30 days supply | Qty: 100 | Fill #1

## 2016-10-02 MED FILL — TRUEPLUS SYR 0.5ML 30GX5/16: 30G X 5/16" | 25 days supply | Qty: 100 | Fill #2

## 2016-10-02 MED FILL — $LEVEMIR 100U/ML VIAL: 100 | 42 days supply | Qty: 10 | Fill #2

## 2016-10-13 ENCOUNTER — Other Ambulatory Visit: Payer: Self-pay | Admitting: Family Medicine

## 2016-10-13 DIAGNOSIS — E1142 Type 2 diabetes mellitus with diabetic polyneuropathy: Secondary | ICD-10-CM

## 2016-10-13 DIAGNOSIS — Z794 Long term (current) use of insulin: Principal | ICD-10-CM

## 2016-10-13 MED FILL — ?METFORMIN HCL 1,000 MG TAB: 1000 | 30 days supply | Qty: 60 | Fill #3

## 2016-10-13 MED FILL — glipiZIDE 5 MG TABS: 5 | 30 days supply | Qty: 30 | Fill #2

## 2016-10-31 MED FILL — GABAPENTIN 300 MG CAPSULE: 300 | 30 days supply | Qty: 90 | Fill #2

## 2016-10-31 MED FILL — ?MELOXICAM 15MG TABLET: 15 | 30 days supply | Qty: 30 | Fill #5

## 2016-10-31 MED FILL — PRAVASTATIN NA 80 MG TAB: 80 | 30 days supply | Qty: 30 | Fill #1

## 2016-11-07 ENCOUNTER — Other Ambulatory Visit: Payer: Self-pay | Admitting: Family Medicine

## 2016-11-07 DIAGNOSIS — E1142 Type 2 diabetes mellitus with diabetic polyneuropathy: Secondary | ICD-10-CM

## 2016-11-07 DIAGNOSIS — Z794 Long term (current) use of insulin: Principal | ICD-10-CM

## 2016-11-07 MED FILL — glipiZIDE 5 MG TABS: 5 | 30 days supply | Qty: 30 | Fill #0

## 2016-11-17 MED FILL — ?METFORMIN HCL 1,000 MG TAB: 1000 | 30 days supply | Qty: 60 | Fill #4

## 2016-12-01 MED FILL — PRAVASTATIN NA 80 MG TAB: 80 | 30 days supply | Qty: 30 | Fill #2

## 2016-12-01 MED FILL — TRUE METRIX TEST STRIP: 30 days supply | Qty: 100 | Fill #2

## 2016-12-01 MED FILL — ?MELOXICAM 15MG TABLET: 15 | 30 days supply | Qty: 30 | Fill #6

## 2016-12-15 MED FILL — $LEVEMIR 100U/ML VIAL: 100 | 42 days supply | Qty: 10 | Fill #3

## 2016-12-15 MED FILL — ?METFORMIN HCL 1,000 MG TAB: 1000 | 30 days supply | Qty: 60 | Fill #5

## 2016-12-15 MED FILL — ?GLIPIZIDE 5MG TABLET: 5 | 30 days supply | Qty: 30 | Fill #1

## 2016-12-19 ENCOUNTER — Ambulatory Visit: Payer: Self-pay | Admitting: Family Medicine

## 2017-01-02 MED FILL — PRAVASTATIN NA 80 MG TAB: 80 | 30 days supply | Qty: 30 | Fill #3

## 2017-01-02 MED FILL — ?MELOXICAM 15MG TABLET: 15 | 24 days supply | Qty: 24 | Fill #7

## 2017-01-02 MED FILL — TRUEPLUS SYR 0.5ML 30GX5/16: 30G X 5/16" | 25 days supply | Qty: 100 | Fill #3

## 2017-01-12 MED FILL — LISINOPRIL-HCTZ 10-12.5 MG: 10-12.5 | 90 days supply | Qty: 90 | Fill #2

## 2017-01-12 MED FILL — ?GLIPIZIDE 5MG TABLET: 5 | 30 days supply | Qty: 30 | Fill #2

## 2017-01-19 ENCOUNTER — Ambulatory Visit: Payer: Self-pay | Attending: Family Medicine | Admitting: Family Medicine

## 2017-01-19 ENCOUNTER — Encounter: Payer: Self-pay | Admitting: Family Medicine

## 2017-01-19 VITALS — BP 134/74 | HR 85 | Temp 97.8°F | Resp 16 | Wt 397.8 lb

## 2017-01-19 DIAGNOSIS — Z794 Long term (current) use of insulin: Secondary | ICD-10-CM | POA: Insufficient documentation

## 2017-01-19 DIAGNOSIS — I10 Essential (primary) hypertension: Secondary | ICD-10-CM | POA: Insufficient documentation

## 2017-01-19 DIAGNOSIS — Z8249 Family history of ischemic heart disease and other diseases of the circulatory system: Secondary | ICD-10-CM | POA: Insufficient documentation

## 2017-01-19 DIAGNOSIS — E1142 Type 2 diabetes mellitus with diabetic polyneuropathy: Secondary | ICD-10-CM | POA: Insufficient documentation

## 2017-01-19 DIAGNOSIS — E785 Hyperlipidemia, unspecified: Secondary | ICD-10-CM | POA: Insufficient documentation

## 2017-01-19 DIAGNOSIS — Z79899 Other long term (current) drug therapy: Secondary | ICD-10-CM | POA: Insufficient documentation

## 2017-01-19 DIAGNOSIS — Z7982 Long term (current) use of aspirin: Secondary | ICD-10-CM | POA: Insufficient documentation

## 2017-01-19 LAB — GLUCOSE, POCT (MANUAL RESULT ENTRY): POC GLUCOSE: 120 mg/dL — AB (ref 70–99)

## 2017-01-19 LAB — POCT GLYCOSYLATED HEMOGLOBIN (HGB A1C): HEMOGLOBIN A1C: 8.1

## 2017-01-19 MED ORDER — LISINOPRIL-HYDROCHLOROTHIAZIDE 20-25 MG PO TABS: 1.0000 | ORAL_TABLET | Freq: Every day | ORAL | 1 refills | Status: DC

## 2017-01-19 MED ORDER — INSULIN ASPART 100 UNIT/ML ~~LOC~~ SOLN
15.0000 [IU] | Freq: Three times a day (TID) | SUBCUTANEOUS | 3 refills | Status: DC
Start: 2017-01-19 — End: 2017-05-21

## 2017-01-19 MED ORDER — METFORMIN HCL 1000 MG PO TABS: 1000.0000 mg | ORAL_TABLET | Freq: Two times a day (BID) | ORAL | 3 refills | Status: DC

## 2017-01-19 MED ORDER — GABAPENTIN 300 MG PO CAPS: 300.0000 mg | ORAL_CAPSULE | Freq: Three times a day (TID) | ORAL | 3 refills | Status: DC

## 2017-01-19 MED ORDER — GLIPIZIDE 5 MG PO TABS: ORAL_TABLET | ORAL | 5 refills | Status: DC

## 2017-01-19 MED ORDER — INSULIN DETEMIR 100 UNIT/ML ~~LOC~~ SOLN: 20.0000 [IU] | Freq: Every day | SUBCUTANEOUS | 3 refills | Status: DC

## 2017-01-19 MED ORDER — ASPIRIN EC 81 MG PO TBEC: 81.0000 mg | DELAYED_RELEASE_TABLET | Freq: Every day | ORAL | 3 refills | Status: AC

## 2017-01-19 MED FILL — $novoLOG 100 UNITS/ML VIAL: 100 | 22 days supply | Qty: 10 | Fill #0

## 2017-01-19 MED FILL — TRUE METRIX TEST STRIP: 30 days supply | Qty: 100 | Fill #3

## 2017-01-19 MED FILL — $LEVEMIR 100U/ML VIAL: 100 | 30 days supply | Qty: 30 | Fill #0

## 2017-01-19 MED FILL — ?METFORMIN HCL 1,000 MG TAB: 1000 | 30 days supply | Qty: 60 | Fill #6

## 2017-01-19 MED FILL — GABAPENTIN 300 MG CAPSULE: 300 | 30 days supply | Qty: 90 | Fill #0

## 2017-01-19 MED FILL — ?PRAVASTATIN SODIUM 40MG TA: 40 | 30 days supply | Qty: 30 | Fill #2

## 2017-01-19 NOTE — Progress Notes (Signed)
Subjective:  Patient ID: Dillon Mcgee, male    DOB: 1960/12/03  Age: 56 y.o. MRN: [id number]  CC: Diabetes   HPI Dillon Mcgee presents for follow up for diabetes. PMH of DM, HTN, and HLD. History of diabetes mellitus. Patient denies foot ulcerations, hyperglycemia, nausea, paresthesia of the feet, polydipsia, polyuria, visual disturbances and vomitting.  Evaluation to date has been included: fasting blood sugar, fasting lipid panel, hemoglobin A1C and microalbuminuria. He brings a log of his CBG with him. Home sugars: BGs lowest 108, highest 294 . Treatment to date: Continued insulin which has been effective, Continued metformin which has been effective. History of hypertension and hyperlipidemia. Cardiac symptoms none. Patient denies chest pain, chest pressure/discomfort, claudication, dyspnea, lower extremity edema, near-syncope, orthopnea, palpitations and syncope.  Cardiovascular risk factors: diabetes mellitus, dyslipidemia, family history of premature cardiovascular disease, hypertension, male gender, obesity (BMI >= 30 kg/m2) and sedentary lifestyle. Use of agents associated with hypertension: none. History of target organ damage: none.   Outpatient Medications Prior to Visit  Medication Sig Dispense Refill  . Blood Glucose Monitoring Suppl (TRUE METRIX GO GLUCOSE METER) w/Device KIT 1 each by Does not apply route every 8 (eight) hours as needed. 1 kit 0  . ferrous sulfate 325 (65 FE) MG tablet Take 325 mg by mouth daily with breakfast.    . glucose blood test strip Use as instructed 100 each 12  . Insulin Syringe-Needle U-100 (TRUEPLUS INSULIN SYRINGE) 30G X 5/16" 1 ML MISC Use as directed 4 times daily 300 each 3  . meloxicam (MOBIC) 15 MG tablet Take 1 tablet (15 mg total) by mouth daily. Take with food 90 tablet 3  . pravastatin (PRAVACHOL) 80 MG tablet Take 1 tablet (80 mg total) by mouth daily. 90 tablet 3  . triamcinolone cream (KENALOG) 0.1 % Apply 1 application topically 2  (two) times daily. 30 g 2  . TRUEPLUS LANCETS 26G MISC 1 each by Does not apply route every 8 (eight) hours as needed. 100 each 12  . aspirin EC 81 MG tablet Take 1 tablet (81 mg total) by mouth daily. 90 tablet 2  . gabapentin (NEURONTIN) 300 MG capsule Take 1 capsule (300 mg total) by mouth 3 (three) times daily. 90 capsule 3  . glipiZIDE (GLUCOTROL) 5 MG tablet TAKE 1/2 TABLETS BY MOUTH 2 TIMES DAILY BEFORE A MEAL. 30 tablet 2  . insulin aspart (NOVOLOG) 100 UNIT/ML injection Inject 15 Units into the skin 3 (three) times daily with meals. 60 mL 3  . insulin detemir (LEVEMIR) 100 UNIT/ML injection Inject 0.18 mLs (18 Units total) into the skin at bedtime. 30 mL 3  . lisinopril-hydrochlorothiazide (PRINZIDE,ZESTORETIC) 10-12.5 MG tablet Take 1 tablet by mouth daily. 90 tablet 3  . metFORMIN (GLUCOPHAGE) 1000 MG tablet Take 1 tablet (1,000 mg total) by mouth 2 (two) times daily with a meal. 180 tablet 3  . Vitamin D, Ergocalciferol, (DRISDOL) 50000 UNITS CAPS capsule Take 1 capsule (50,000 Units total) by mouth every 7 (seven) days. 12 capsule 0  . acetaminophen (TYLENOL) 500 MG tablet Take 2 tablets (1,000 mg total) by mouth every 6 (six) hours as needed for moderate pain. (Patient not taking: Reported on 01/19/2017) 30 tablet 0   No facility-administered medications prior to visit.     ROS Review of Systems  Constitutional: Negative.   Eyes: Negative.   Respiratory: Negative.   Cardiovascular: Negative.   Gastrointestinal: Negative.   Skin: Negative.   Neurological: Negative.    Objective:  BP 134/74 (BP Location: Left Arm, Cuff Size: Large)   Pulse 85   Temp 97.8 F (36.6 C) (Oral)   Resp 16   Wt (!) 397 lb 12.8 oz (180.4 kg)   SpO2 97%   BMI 48.42 kg/m   BP/Weight 01/19/2017 09/19/2016 0/93/2671  Systolic BP 245 809 983  Diastolic BP 74 73 70  Wt. (Lbs) 397.8 395.4 403  BMI 48.42 48.13 50.37   Physical Exam  Constitutional: He appears well-developed and well-nourished.    Eyes: Conjunctivae are normal. Pupils are equal, round, and reactive to light.  Neck: Normal range of motion. Neck supple. No JVD present.  Cardiovascular: Normal rate, regular rhythm, normal heart sounds and intact distal pulses.  Pulmonary/Chest: Effort normal and breath sounds normal.  Abdominal: Soft. Bowel sounds are normal. There is no tenderness.  Neurological: He is alert.  Skin: Skin is warm and dry.  Skin to bilateral feet and legs, wnl. No wounds present.  Psychiatric: He has a normal mood and affect.  Nursing note and vitals reviewed.  Assessment & Plan:   1. Type 2 diabetes mellitus with diabetic polyneuropathy, with long-term current use of insulin ([facility]) Increased levemir 18 to 20 units. Encouraged more attention to diet and incorporating exercise. Patient reports seeing nutritionist in the past. - Glucose (CBG) - HgB A1c - insulin detemir (LEVEMIR) 100 UNIT/ML injection; Inject 0.2 mLs (20 Units total) at bedtime into the skin.  Dispense: 30 mL; Refill: 3 - gabapentin (NEURONTIN) 300 MG capsule; Take 1 capsule (300 mg total) 3 (three) times daily by mouth.  Dispense: 90 capsule; Refill: 3 - insulin aspart (NOVOLOG) 100 UNIT/ML injection; Inject 15 Units 3 (three) times daily with meals into the skin.  Dispense: 60 mL; Refill: 3 - aspirin EC 81 MG tablet; Take 1 tablet (81 mg total) daily by mouth.  Dispense: 90 tablet; Refill: 3 - glipiZIDE (GLUCOTROL) 5 MG tablet; TAKE 1/2 TABLETS BY MOUTH 2 TIMES DAILY BEFORE A MEAL.  Dispense: 30 tablet; Refill: 5 - Lipid Panel; Future - metFORMIN (GLUCOPHAGE) 1000 MG tablet; Take 1 tablet (1,000 mg total) 2 (two) times daily with a meal by mouth.  Dispense: 180 tablet; Refill: 3  2. Essential hypertension BP recheck elevated, increase dose. - lisinopril-hydrochlorothiazide (PRINZIDE,ZESTORETIC) 20-25 MG tablet; Take 1 tablet daily by mouth.  Dispense: 90 tablet; Refill: 1  3. Hyperlipidemia, unspecified hyperlipidemia type  -  aspirin EC 81 MG tablet; Take 1 tablet (81 mg total) daily by mouth.  Dispense: 90 tablet; Refill: 3 - Lipid Panel; Future     Follow-up: Return in about 3 months (around 04/21/2017) for HTN/DM.   Alfonse Spruce FNP

## 2017-01-19 NOTE — Patient Instructions (Signed)
Schedule walk in lab visit for cholesterol screening. Must be fasting at least 4 hours prior to lab being drawn.  Diabetes Mellitus and Food It is important for you to manage your blood sugar (glucose) level. Your blood glucose level can be greatly affected by what you eat. Eating healthier foods in the appropriate amounts throughout the day at about the same time each day will help you control your blood glucose level. It can also help slow or prevent worsening of your diabetes mellitus. Healthy eating may even help you improve the level of your blood pressure and reach or maintain a healthy weight. General recommendations for healthful eating and cooking habits include:  Eating meals and snacks regularly. Avoid going long periods of time without eating to lose weight.  Eating a diet that consists mainly of plant-based foods, such as fruits, vegetables, nuts, legumes, and whole grains.  Using low-heat cooking methods, such as baking, instead of high-heat cooking methods, such as deep frying.  Work with your dietitian to make sure you understand how to use the Nutrition Facts information on food labels. How can food affect me? Carbohydrates Carbohydrates affect your blood glucose level more than any other type of food. Your dietitian will help you determine how many carbohydrates to eat at each meal and teach you how to count carbohydrates. Counting carbohydrates is important to keep your blood glucose at a healthy level, especially if you are using insulin or taking certain medicines for diabetes mellitus. Alcohol Alcohol can cause sudden decreases in blood glucose (hypoglycemia), especially if you use insulin or take certain medicines for diabetes mellitus. Hypoglycemia can be a life-threatening condition. Symptoms of hypoglycemia (sleepiness, dizziness, and disorientation) are similar to symptoms of having too much alcohol. If your health care provider has given you approval to drink alcohol, do  so in moderation and use the following guidelines:  Women should not have more than one drink per day, and men should not have more than two drinks per day. One drink is equal to: ? 12 oz of beer. ? 5 oz of wine. ? 1 oz of hard liquor.  Do not drink on an empty stomach.  Keep yourself hydrated. Have water, diet soda, or unsweetened iced tea.  Regular soda, juice, and other mixers might contain a lot of carbohydrates and should be counted.  What foods are not recommended? As you make food choices, it is important to remember that all foods are not the same. Some foods have fewer nutrients per serving than other foods, even though they might have the same number of calories or carbohydrates. It is difficult to get your body what it needs when you eat foods with fewer nutrients. Examples of foods that you should avoid that are high in calories and carbohydrates but low in nutrients include:  Trans fats (most processed foods list trans fats on the Nutrition Facts label).  Regular soda.  Juice.  Candy.  Sweets, such as cake, pie, doughnuts, and cookies.  Fried foods.  What foods can I eat? Eat nutrient-rich foods, which will nourish your body and keep you healthy. The food you should eat also will depend on several factors, including:  The calories you need.  The medicines you take.  Your weight.  Your blood glucose level.  Your blood pressure level.  Your cholesterol level.  You should eat a variety of foods, including:  Protein. ? Lean cuts of meat. ? Proteins low in saturated fats, such as fish, egg whites, and beans.  Avoid processed meats.  Fruits and vegetables. ? Fruits and vegetables that may help control blood glucose levels, such as apples, mangoes, and yams.  Dairy products. ? Choose fat-free or low-fat dairy products, such as milk, yogurt, and cheese.  Grains, bread, pasta, and rice. ? Choose whole grain products, such as multigrain bread, whole oats,  and brown rice. These foods may help control blood pressure.  Fats. ? Foods containing healthful fats, such as nuts, avocado, olive oil, canola oil, and fish.  Does everyone with diabetes mellitus have the same meal plan? Because every person with diabetes mellitus is different, there is not one meal plan that works for everyone. It is very important that you meet with a dietitian who will help you create a meal plan that is just right for you. This information is not intended to replace advice given to you by your health care provider. Make sure you discuss any questions you have with your health care provider. Document Released: 11/14/2004 Document Revised: 07/26/2015 Document Reviewed: 01/14/2013 Elsevier Interactive Patient Education  2017 [employer]

## 2017-01-19 NOTE — Progress Notes (Signed)
Follow up DM

## 2017-01-26 ENCOUNTER — Ambulatory Visit: Payer: Self-pay

## 2017-01-26 ENCOUNTER — Ambulatory Visit: Payer: Self-pay | Attending: Family Medicine

## 2017-01-26 DIAGNOSIS — E785 Hyperlipidemia, unspecified: Secondary | ICD-10-CM | POA: Insufficient documentation

## 2017-01-26 DIAGNOSIS — E1142 Type 2 diabetes mellitus with diabetic polyneuropathy: Secondary | ICD-10-CM | POA: Insufficient documentation

## 2017-01-26 DIAGNOSIS — Z794 Long term (current) use of insulin: Secondary | ICD-10-CM | POA: Insufficient documentation

## 2017-01-26 NOTE — Progress Notes (Signed)
Patient here for lab visit only 

## 2017-01-27 LAB — LIPID PANEL
CHOL/HDL RATIO: 4.1 ratio (ref 0.0–5.0)
CHOLESTEROL TOTAL: 172 mg/dL (ref 100–199)
HDL: 42 mg/dL (ref >39)
LDL CALC: 98 mg/dL (ref 0–99)
Triglycerides: 160 mg/dL — ABNORMAL HIGH (ref 0–149)
VLDL CHOLESTEROL CAL: 32 mg/dL (ref 5–40)

## 2017-02-02 ENCOUNTER — Telehealth: Payer: Self-pay

## 2017-02-02 ENCOUNTER — Other Ambulatory Visit: Payer: Self-pay | Admitting: Family Medicine

## 2017-02-02 DIAGNOSIS — E785 Hyperlipidemia, unspecified: Principal | ICD-10-CM

## 2017-02-02 DIAGNOSIS — E1169 Type 2 diabetes mellitus with other specified complication: Secondary | ICD-10-CM

## 2017-02-02 MED ORDER — OMEGA-3-ACID ETHYL ESTERS 1 G PO CAPS: 1.0000 g | ORAL_CAPSULE | Freq: Two times a day (BID) | ORAL | 5 refills | Status: DC

## 2017-02-02 MED FILL — OMEGA-3 ETHYL ESTERS 1 GM C: 1 | 30 days supply | Qty: 60 | Fill #0

## 2017-02-02 MED FILL — MELOXICAM 15 MG TABLET: 15 | 24 days supply | Qty: 24 | Fill #8

## 2017-02-02 NOTE — Telephone Encounter (Signed)
-----   Message from Lizbeth BarkMandesia R Hairston, FNP sent at 02/02/2017  8:52 AM EST ----- Triglyceride levels were still elevated. This can increase your risk of heart disease overtime. You will be prescribed lovaza. Continue to take your pravastatin.  Eat a diet low in saturated fat. Limit your intake of fried foods, red meats, and whole milk. Increase activity.

## 2017-02-02 NOTE — Telephone Encounter (Signed)
CMA call regarding lab results   Patient Verify DOB   Patient was aware and understood  

## 2017-02-09 ENCOUNTER — Other Ambulatory Visit: Payer: Self-pay | Admitting: Family Medicine

## 2017-02-09 DIAGNOSIS — Z794 Long term (current) use of insulin: Principal | ICD-10-CM

## 2017-02-09 DIAGNOSIS — E1142 Type 2 diabetes mellitus with diabetic polyneuropathy: Secondary | ICD-10-CM

## 2017-02-11 MED FILL — ?GLIPIZIDE 5MG TABLET: 5 | 30 days supply | Qty: 30 | Fill #0

## 2017-02-25 MED FILL — $novoLOG 100 UNITS/ML VIAL: 100 | 22 days supply | Qty: 10 | Fill #1

## 2017-02-25 MED FILL — ?PRAVASTATIN SODIUM 40MG TA: 40 | 30 days supply | Qty: 30 | Fill #3

## 2017-02-25 MED FILL — ?METFORMIN HCL 1,000 MG TAB: 1000 | 30 days supply | Qty: 60 | Fill #7

## 2017-02-25 MED FILL — $LEVEMIR 100U/ML VIAL: 100 | 42 days supply | Qty: 10 | Fill #1

## 2017-02-25 MED FILL — TRUEPLUS SYR 0.5ML 30GX5/16: 30G X 5/16" | 25 days supply | Qty: 100 | Fill #4

## 2017-02-25 MED FILL — MELOXICAM 15 MG TABLET: 15 | 24 days supply | Qty: 24 | Fill #9

## 2017-03-02 MED FILL — OMEGA-3 ETHYL ESTERS 1 GM C: 1 | 30 days supply | Qty: 60 | Fill #1

## 2017-03-23 MED FILL — MELOXICAM 15 MG TABLET: 15 | 24 days supply | Qty: 24 | Fill #10

## 2017-03-23 MED FILL — ?GLIPIZIDE 5MG TABLET: 5 | 30 days supply | Qty: 30 | Fill #1

## 2017-03-23 MED FILL — $novoLOG 100 UNITS/ML VIAL: 100 | 22 days supply | Qty: 10 | Fill #2

## 2017-03-27 MED FILL — ?PRAVASTATIN SODIUM 40MG TA: 40 | 30 days supply | Qty: 30 | Fill #4

## 2017-03-27 MED FILL — ?METFORMIN HCL 1,000 MG TAB: 1000 | 30 days supply | Qty: 60 | Fill #8

## 2017-04-06 MED FILL — OMEGA-3 ETHYL ESTERS 1 GM C: 1 | 30 days supply | Qty: 60 | Fill #2

## 2017-04-06 MED FILL — GABAPENTIN 300 MG CAPSULE: 300 | 30 days supply | Qty: 90 | Fill #1

## 2017-04-06 MED FILL — TRUE METRIX TEST STRIP: 30 days supply | Qty: 100 | Fill #4

## 2017-04-20 MED FILL — ?GLIPIZIDE 5MG TABLET: 5 | 30 days supply | Qty: 30 | Fill #2

## 2017-04-20 MED FILL — MELOXICAM 15 MG TABLET: 15 | 30 days supply | Qty: 30 | Fill #11

## 2017-04-23 ENCOUNTER — Ambulatory Visit: Payer: Self-pay | Admitting: Family Medicine

## 2017-04-27 MED FILL — ?PRAVASTATIN SODIUM 40MG TA: 40 | 30 days supply | Qty: 30 | Fill #5

## 2017-04-27 MED FILL — ?METFORMIN HCL 1,000 MG TAB: 1000 | 30 days supply | Qty: 60 | Fill #9

## 2017-05-04 MED FILL — OMEGA-3 ETHYL ESTERS 1 GM C: 1 | 30 days supply | Qty: 60 | Fill #3

## 2017-05-11 MED FILL — $novoLOG 100 UNITS/ML VIAL: 100 | 22 days supply | Qty: 10 | Fill #3

## 2017-05-11 MED FILL — TRUEPLUS SYR 0.5ML 30GX5/16: 30G X 5/16" | 25 days supply | Qty: 100 | Fill #5

## 2017-05-18 MED FILL — MELOXICAM 15 MG TABLET: 15 | 24 days supply | Qty: 24 | Fill #12

## 2017-05-21 ENCOUNTER — Ambulatory Visit: Payer: Self-pay | Attending: Family Medicine | Admitting: Family Medicine

## 2017-05-21 ENCOUNTER — Encounter: Payer: Self-pay | Admitting: Family Medicine

## 2017-05-21 VITALS — BP 132/87 | HR 73 | Temp 97.5°F | Ht 76.0 in | Wt 396.0 lb

## 2017-05-21 DIAGNOSIS — Z6841 Body Mass Index (BMI) 40.0 and over, adult: Secondary | ICD-10-CM | POA: Insufficient documentation

## 2017-05-21 DIAGNOSIS — Z79899 Other long term (current) drug therapy: Secondary | ICD-10-CM | POA: Insufficient documentation

## 2017-05-21 DIAGNOSIS — E1140 Type 2 diabetes mellitus with diabetic neuropathy, unspecified: Secondary | ICD-10-CM

## 2017-05-21 DIAGNOSIS — E1142 Type 2 diabetes mellitus with diabetic polyneuropathy: Secondary | ICD-10-CM | POA: Insufficient documentation

## 2017-05-21 DIAGNOSIS — I2510 Atherosclerotic heart disease of native coronary artery without angina pectoris: Secondary | ICD-10-CM | POA: Insufficient documentation

## 2017-05-21 DIAGNOSIS — E785 Hyperlipidemia, unspecified: Secondary | ICD-10-CM

## 2017-05-21 DIAGNOSIS — Z7982 Long term (current) use of aspirin: Secondary | ICD-10-CM | POA: Insufficient documentation

## 2017-05-21 DIAGNOSIS — Z794 Long term (current) use of insulin: Secondary | ICD-10-CM

## 2017-05-21 DIAGNOSIS — I10 Essential (primary) hypertension: Secondary | ICD-10-CM

## 2017-05-21 LAB — GLUCOSE, POCT (MANUAL RESULT ENTRY): POC Glucose: 152 mg/dl — AB (ref 70–99)

## 2017-05-21 LAB — POCT GLYCOSYLATED HEMOGLOBIN (HGB A1C): HEMOGLOBIN A1C: 8.7

## 2017-05-21 MED ORDER — GLIPIZIDE 5 MG PO TABS: 5.0000 mg | ORAL_TABLET | Freq: Two times a day (BID) | ORAL | 3 refills | Status: DC

## 2017-05-21 MED ORDER — GABAPENTIN 300 MG PO CAPS: 300.0000 mg | ORAL_CAPSULE | Freq: Three times a day (TID) | ORAL | 3 refills | Status: DC

## 2017-05-21 MED ORDER — ATORVASTATIN CALCIUM 40 MG PO TABS: 40.0000 mg | ORAL_TABLET | Freq: Every day | ORAL | 3 refills | Status: DC

## 2017-05-21 MED ORDER — LIRAGLUTIDE 18 MG/3ML ~~LOC~~ SOPN: PEN_INJECTOR | SUBCUTANEOUS | 3 refills | Status: DC

## 2017-05-21 MED ORDER — METFORMIN HCL 1000 MG PO TABS
1000.0000 mg | ORAL_TABLET | Freq: Two times a day (BID) | ORAL | 3 refills | Status: DC
Start: 2017-05-21 — End: 2017-10-01

## 2017-05-21 MED ORDER — LISINOPRIL-HYDROCHLOROTHIAZIDE 20-25 MG PO TABS: 1.0000 | ORAL_TABLET | Freq: Every day | ORAL | 3 refills | Status: DC

## 2017-05-21 MED ORDER — INSULIN DETEMIR 100 UNIT/ML ~~LOC~~ SOLN: 20.0000 [IU] | Freq: Every day | SUBCUTANEOUS | 3 refills | Status: DC

## 2017-05-21 MED FILL — ?ATORVASTATIN 40MG TAB: 40 | 30 days supply | Qty: 30 | Fill #0

## 2017-05-21 MED FILL — $LEVEMIR 100U/ML VIAL: 100 | 42 days supply | Qty: 10 | Fill #0

## 2017-05-21 MED FILL — glipiZIDE 5 MG TABS: 5 | 30 days supply | Qty: 60 | Fill #0

## 2017-05-21 MED FILL — **VICTOZA 18 MG/3 ML INJECT: 18 | 10 days supply | Qty: 3 | Fill #0

## 2017-05-21 MED FILL — LISINOPRIL-HCTZ 20-25 MG TA: 20-25 | 30 days supply | Qty: 30 | Fill #0

## 2017-05-21 MED FILL — ?METFORMIN HCL 1,000 MG TAB: 1000 | 30 days supply | Qty: 60 | Fill #0

## 2017-05-21 MED FILL — GABAPENTIN 300 MG CAPSULE: 300 | 30 days supply | Qty: 90 | Fill #0

## 2017-05-21 NOTE — Progress Notes (Signed)
Subjective:  Patient ID: Dillon Mcgee, male    DOB: 11-17-1960  Age: 57 y.o. MRN: [id number]  CC: Hypertension and Diabetes   HPI Dillon Mcgee is a 57 year old male with a history of hypertension, type 2 diabetes mellitus (A1c 8.7), hyperlipidemia, obesity who presents today to establish care with me as he was previously followed by Dillon Beets, FNP. His A1c is 8.7 and has trended up from 8.1 previously  And he endorses compliance with his Levemir 20 units at bedtime, glipizide 2.5 mg twice daily, metformin 1000 mg twice daily and takes anywhere from 5-15 units of NovoLog 3 times daily depending on his blood sugars. His blood sugar log reveals fasting sugars of 130-186 and random sugars in the 170-200 range and he denies hypoglycemia, blurry vision.  He endorses excessive eating especially when he is nervous and he does not exercise. Also concerned about his inability to lose weight.  Doing well on his antihypertensive and his statin and denies myalgias.  he has no additional concerns at this time.    Past Medical History:  Diagnosis Date  . Diabetes mellitus without complication ([place])   . Hyperlipidemia   . Hypertension     Past Surgical History:  Procedure Laterality Date  . COLONOSCOPY     5 years ago     Allergies  Allergen Reactions  . Pioglitazone Swelling    Lower extremity swelling.     Outpatient Medications Prior to Visit  Medication Sig Dispense Refill  . acetaminophen (TYLENOL) 500 MG tablet Take 2 tablets (1,000 mg total) by mouth every 6 (six) hours as needed for moderate pain. 30 tablet 0  . aspirin EC 81 MG tablet Take 1 tablet (81 mg total) daily by mouth. 90 tablet 3  . Blood Glucose Monitoring Suppl (TRUE METRIX GO GLUCOSE METER) w/Device KIT 1 each by Does not apply route every 8 (eight) hours as needed. 1 kit 0  . ferrous sulfate 325 (65 FE) MG tablet Take 325 mg by mouth daily with breakfast.    . glucose blood test strip Use as instructed  100 each 12  . Insulin Syringe-Needle U-100 (TRUEPLUS INSULIN SYRINGE) 30G X 5/16" 1 ML MISC Use as directed 4 times daily 300 each 3  . meloxicam (MOBIC) 15 MG tablet Take 1 tablet (15 mg total) by mouth daily. Take with food 90 tablet 3  . TRUEPLUS LANCETS 26G MISC 1 each by Does not apply route every 8 (eight) hours as needed. 100 each 12  . gabapentin (NEURONTIN) 300 MG capsule Take 1 capsule (300 mg total) 3 (three) times daily by mouth. 90 capsule 3  . glipiZIDE (GLUCOTROL) 5 MG tablet TAKE 1/2 TABLETS BY MOUTH 2 TIMES DAILY BEFORE A MEAL. 30 tablet 5  . insulin aspart (NOVOLOG) 100 UNIT/ML injection Inject 15 Units 3 (three) times daily with meals into the skin. 60 mL 3  . insulin detemir (LEVEMIR) 100 UNIT/ML injection Inject 0.2 mLs (20 Units total) at bedtime into the skin. 30 mL 3  . lisinopril-hydrochlorothiazide (PRINZIDE,ZESTORETIC) 20-25 MG tablet Take 1 tablet daily by mouth. 90 tablet 1  . metFORMIN (GLUCOPHAGE) 1000 MG tablet Take 1 tablet (1,000 mg total) 2 (two) times daily with a meal by mouth. 180 tablet 3  . pravastatin (PRAVACHOL) 80 MG tablet Take 1 tablet (80 mg total) by mouth daily. 90 tablet 3  . omega-3 acid ethyl esters (LOVAZA) 1 g capsule Take 1 capsule (1 g total) by mouth 2 (two) times daily. (Patient  not taking: Reported on 05/21/2017) 60 capsule 5  . triamcinolone cream (KENALOG) 0.1 % Apply 1 application topically 2 (two) times daily. (Patient not taking: Reported on 05/21/2017) 30 g 2   No facility-administered medications prior to visit.     ROS Review of Systems  Constitutional: Negative for activity change and appetite change.  HENT: Negative for sinus pressure and sore throat.   Eyes: Negative for visual disturbance.  Respiratory: Negative for cough, chest tightness and shortness of breath.   Cardiovascular: Negative for chest pain and leg swelling.  Gastrointestinal: Negative for abdominal distention, abdominal pain, constipation and diarrhea.    Endocrine: Negative.   Genitourinary: Negative for dysuria.  Musculoskeletal: Negative for joint swelling and myalgias.  Skin: Negative for rash.  Allergic/Immunologic: Negative.   Neurological: Negative for weakness, light-headedness and numbness.  Psychiatric/Behavioral: Negative for dysphoric mood and suicidal ideas.    Objective:  BP 132/87   Pulse 73   Temp (!) 97.5 F (36.4 C) (Oral)   Ht _0  (1.93 m)   Wt (!) 396 lb (179.6 kg)   SpO2 96%   BMI 48.20 kg/m   BP/Weight 05/21/2017 01/19/2017 04/24/9796  Systolic BP 921 194 174  Diastolic BP 87 74 73  Wt. (Lbs) 396 397.8 395.4  BMI 48.2 48.42 48.13      Physical Exam  Constitutional: He is oriented to person, place, and time. He appears well-developed and well-nourished.  Morbidly obese  Cardiovascular: Normal rate, normal heart sounds and intact distal pulses.  No murmur heard. Pulmonary/Chest: Effort normal and breath sounds normal. He has no wheezes. He has no rales. He exhibits no tenderness.  Abdominal: Soft. Bowel sounds are normal. He exhibits no distension and no mass. There is no tenderness.  Musculoskeletal: Normal range of motion.  Neurological: He is alert and oriented to person, place, and time.  Skin: Skin is warm and dry.  Psychiatric: He has a normal mood and affect.    CMP Latest Ref Rng & Units 08/14/2016 12/03/2015 06/07/2015  Glucose 65 - 99 mg/dL 113(H) 95 161(H)  BUN 6 - 24 mg/dL _1 Creatinine 0.76 - 1.27 mg/dL 1.04 0.85 0.87  Sodium 134 - 144 mmol/L 146(H) 142 140  Potassium 3.5 - 5.2 mmol/L 5.4(H) 4.0 4.9  Chloride 96 - 106 mmol/L 106 105 103  CO2 20 - 29 mmol/L _2 Calcium 8.7 - 10.2 mg/dL 9.4 9.6 9.0  Total Protein 6.0 - 8.5 g/dL 6.2 - 6.4  Total Bilirubin 0.0 - 1.2 mg/dL <0.2 - 0.3  Alkaline Phos 39 - 117 IU/L 70 - 63  AST 0 - 40 IU/L 19 - 15  ALT 0 - 44 IU/L 16 - 16    Lipid Panel     Component Value Date/Time   CHOL 172 01/26/2017 0845   TRIG 160 (H) 01/26/2017  0845   HDL 42 01/26/2017 0845   CHOLHDL 4.1 01/26/2017 0845   CHOLHDL 3.5 06/07/2015 1005   VLDL 33 (H) 06/07/2015 1005   LDLCALC 98 01/26/2017 0845    The 10-year ASCVD risk score Mikey Bussing DC Jr., et al., 2013) is: 11.6%   Values used to calculate the score:     Age: 78 years     Sex: Male     Is Non-Hispanic African American: No     Diabetic: Yes     Tobacco smoker: No     Systolic Blood Pressure: 081 mmHg     Is BP treated: Yes  HDL Cholesterol: 42 mg/dL     Total Cholesterol: 172 mg/dL   Lab Results  Component Value Date   HGBA1C 8.7 05/21/2017     Assessment & Plan:   1. Type 2 diabetes mellitus with diabetic polyneuropathy, with long-term current use of insulin (HCC) Uncontrolled with A1c of 8.7 which has trended up from 8.1 Commence Victoza which will help with weight loss educated on titration instructions;  Discontinue NovoLog sliding scale Increase glipizide from 2.5 mg twice daily to 5 mg twice daily Review blood sugar log at next visit Continue diabetic diet, lifestyle modifications. - POCT glucose (manual entry) - POCT glycosylated hemoglobin (Hb A1C) - glipiZIDE (GLUCOTROL) 5 MG tablet; Take 1 tablet (5 mg total) by mouth 2 (two) times daily before a meal.  Dispense: 60 tablet; Refill: 3 - metFORMIN (GLUCOPHAGE) 1000 MG tablet; Take 1 tablet (1,000 mg total) by mouth 2 (two) times daily with a meal.  Dispense: 60 tablet; Refill: 3 - insulin detemir (LEVEMIR) 100 UNIT/ML injection; Inject 0.2 mLs (20 Units total) into the skin at bedtime.  Dispense: 30 mL; Refill: 3 - gabapentin (NEURONTIN) 300 MG capsule; Take 1 capsule (300 mg total) by mouth 3 (three) times daily.  Dispense: 90 capsule; Refill: 3 - liraglutide (VICTOZA) 18 MG/3ML SOPN; Inject subcutaneously in the morning 0.6 mg daily for 1 week then 1.2 mg daily for 1 week then 1.8 mg daily thereafter  Dispense: 30 mL; Refill: 3  2. Essential hypertension Controlled Counseled on blood pressure goal of  less than 130/80, low-sodium, DASH diet, medication compliance, 150 minutes of moderate intensity exercise per week. Discussed medication compliance, adverse effects. - lisinopril-hydrochlorothiazide (PRINZIDE,ZESTORETIC) 20-25 MG tablet; Take 1 tablet by mouth daily.  Dispense: 30 tablet; Refill: 3  3. Hyperlipidemia, unspecified hyperlipidemia type Switched from pravastatin to atorvastatin which is a moderate intensity statin ASCVD risk of 11.6% - atorvastatin (LIPITOR) 40 MG tablet; Take 1 tablet (40 mg total) by mouth daily.  Dispense: 30 tablet; Refill: 3  4. Obesity, morbid ([place]) Discussed reducing portion sizes, increasing physical activity, avoiding late meals   Meds ordered this encounter  Medications  . glipiZIDE (GLUCOTROL) 5 MG tablet    Sig: Take 1 tablet (5 mg total) by mouth 2 (two) times daily before a meal.    Dispense:  60 tablet    Refill:  3  . lisinopril-hydrochlorothiazide (PRINZIDE,ZESTORETIC) 20-25 MG tablet    Sig: Take 1 tablet by mouth daily.    Dispense:  30 tablet    Refill:  3  . metFORMIN (GLUCOPHAGE) 1000 MG tablet    Sig: Take 1 tablet (1,000 mg total) by mouth 2 (two) times daily with a meal.    Dispense:  60 tablet    Refill:  3  . insulin detemir (LEVEMIR) 100 UNIT/ML injection    Sig: Inject 0.2 mLs (20 Units total) into the skin at bedtime.    Dispense:  30 mL    Refill:  3  . gabapentin (NEURONTIN) 300 MG capsule    Sig: Take 1 capsule (300 mg total) by mouth 3 (three) times daily.    Dispense:  90 capsule    Refill:  3  . atorvastatin (LIPITOR) 40 MG tablet    Sig: Take 1 tablet (40 mg total) by mouth daily.    Dispense:  30 tablet    Refill:  3    Discontinue pravastatin  . liraglutide (VICTOZA) 18 MG/3ML SOPN    Sig: Inject subcutaneously in the morning 0.6  mg daily for 1 week then 1.2 mg daily for 1 week then 1.8 mg daily thereafter    Dispense:  30 mL    Refill:  3    Discontinue NovoLog    Follow-up: Return in about 1 month  (around 06/21/2017) for Follow-up on diabetes mellitus.   Charlott Rakes MD

## 2017-05-21 NOTE — Patient Instructions (Signed)
Medication changes: -Discontinue NovoLog -Discontinue pravastatin -Commence Victoza for diabetes -Commence atorvastatin for cholesterol -Increase glipizide to 1 whole tablet (5 mg) twice daily

## 2017-06-08 ENCOUNTER — Other Ambulatory Visit: Payer: Self-pay

## 2017-06-08 MED ORDER — MELOXICAM 15 MG PO TABS: 15.0000 mg | ORAL_TABLET | Freq: Every day | ORAL | 3 refills | Status: DC

## 2017-06-08 MED FILL — OMEGA-3 ETHYL ESTERS 1 GM C: 1 | 30 days supply | Qty: 60 | Fill #4

## 2017-06-08 MED FILL — **VICTOZA 18 MG/3 ML INJECT: 18 | 10 days supply | Qty: 3 | Fill #1

## 2017-06-15 MED FILL — ?ATORVASTATIN 40MG TABLET: 40 | 30 days supply | Qty: 30 | Fill #1

## 2017-06-15 MED FILL — LISINOPRIL-HCTZ 20-25 MG TA: 20-25 | 30 days supply | Qty: 30 | Fill #1

## 2017-06-15 MED FILL — glipiZIDE 5 MG TABS: 5 | 30 days supply | Qty: 60 | Fill #1

## 2017-06-18 MED FILL — MELOXICAM 15 MG TABLET: 15 | 30 days supply | Qty: 30 | Fill #0

## 2017-06-22 MED FILL — ?METFORMIN HCL 1,000 MG TAB: 1000 | 30 days supply | Qty: 60 | Fill #1

## 2017-06-22 MED FILL — TRUE METRIX TEST STRIP: 30 days supply | Qty: 100 | Fill #5

## 2017-06-22 MED FILL — $VICTOZA 2-PAK 18MG/3ML PEN: 18 | 30 days supply | Qty: 9 | Fill #2

## 2017-06-25 ENCOUNTER — Encounter: Payer: Self-pay | Admitting: Family Medicine

## 2017-06-25 ENCOUNTER — Ambulatory Visit: Payer: Self-pay | Attending: Family Medicine | Admitting: Family Medicine

## 2017-06-25 VITALS — BP 140/75 | HR 82 | Temp 97.9°F | Ht 76.0 in | Wt 380.0 lb

## 2017-06-25 DIAGNOSIS — M7989 Other specified soft tissue disorders: Secondary | ICD-10-CM | POA: Insufficient documentation

## 2017-06-25 DIAGNOSIS — E1142 Type 2 diabetes mellitus with diabetic polyneuropathy: Secondary | ICD-10-CM | POA: Insufficient documentation

## 2017-06-25 DIAGNOSIS — Z7982 Long term (current) use of aspirin: Secondary | ICD-10-CM | POA: Insufficient documentation

## 2017-06-25 DIAGNOSIS — Z794 Long term (current) use of insulin: Secondary | ICD-10-CM | POA: Insufficient documentation

## 2017-06-25 DIAGNOSIS — I10 Essential (primary) hypertension: Secondary | ICD-10-CM | POA: Insufficient documentation

## 2017-06-25 DIAGNOSIS — Z79899 Other long term (current) drug therapy: Secondary | ICD-10-CM | POA: Insufficient documentation

## 2017-06-25 DIAGNOSIS — E785 Hyperlipidemia, unspecified: Secondary | ICD-10-CM | POA: Insufficient documentation

## 2017-06-25 LAB — GLUCOSE, POCT (MANUAL RESULT ENTRY): POC Glucose: 106 mg/dl — AB (ref 70–99)

## 2017-06-25 MED ORDER — LIRAGLUTIDE 18 MG/3ML ~~LOC~~ SOPN: 1.8000 mg | PEN_INJECTOR | Freq: Every day | SUBCUTANEOUS | 3 refills | Status: DC

## 2017-06-25 NOTE — Progress Notes (Signed)
Subjective:  Patient ID: Dillon Mcgee, male    DOB: 1961/02/19  Age: 57 y.o. MRN: [id number]  CC: Diabetes   HPI Dillon Mcgee s a 57 year old male with a history of hypertension, type 2 diabetes mellitus (A1c 8.7), hyperlipidemia, obesity who was commenced on Victoza at his last office visit for optimization of glycemic control. He brings in his blood sugar log which revealed fasting sugars between 82 and 140 with a single fasting sugar of 200 and he has lost 16 pounds in the last 1 month. He is also taking his Levemir 20 units at bedtime; Novolog had been discontinued. He is working on a diabetic diet and exercising. He has no additional concerns today.  Past Medical History:  Diagnosis Date  . Diabetes mellitus without complication ([place])   . Hyperlipidemia   . Hypertension     Past Surgical History:  Procedure Laterality Date  . COLONOSCOPY     5 years ago     Allergies  Allergen Reactions  . Pioglitazone Swelling    Lower extremity swelling.      Outpatient Medications Prior to Visit  Medication Sig Dispense Refill  . acetaminophen (TYLENOL) 500 MG tablet Take 2 tablets (1,000 mg total) by mouth every 6 (six) hours as needed for moderate pain. 30 tablet 0  . aspirin EC 81 MG tablet Take 1 tablet (81 mg total) daily by mouth. 90 tablet 3  . atorvastatin (LIPITOR) 40 MG tablet Take 1 tablet (40 mg total) by mouth daily. 30 tablet 3  . Blood Glucose Monitoring Suppl (TRUE METRIX GO GLUCOSE METER) w/Device KIT 1 each by Does not apply route every 8 (eight) hours as needed. 1 kit 0  . ferrous sulfate 325 (65 FE) MG tablet Take 325 mg by mouth daily with breakfast.    . gabapentin (NEURONTIN) 300 MG capsule Take 1 capsule (300 mg total) by mouth 3 (three) times daily. 90 capsule 3  . glipiZIDE (GLUCOTROL) 5 MG tablet Take 1 tablet (5 mg total) by mouth 2 (two) times daily before a meal. 60 tablet 3  . glucose blood test strip Use as instructed 100 each 12  . insulin  detemir (LEVEMIR) 100 UNIT/ML injection Inject 0.2 mLs (20 Units total) into the skin at bedtime. 30 mL 3  . Insulin Syringe-Needle U-100 (TRUEPLUS INSULIN SYRINGE) 30G X 5/16" 1 ML MISC Use as directed 4 times daily 300 each 3  . lisinopril-hydrochlorothiazide (PRINZIDE,ZESTORETIC) 20-25 MG tablet Take 1 tablet by mouth daily. 30 tablet 3  . meloxicam (MOBIC) 15 MG tablet Take 1 tablet (15 mg total) by mouth daily. Take with food 90 tablet 3  . metFORMIN (GLUCOPHAGE) 1000 MG tablet Take 1 tablet (1,000 mg total) by mouth 2 (two) times daily with a meal. 60 tablet 3  . TRUEPLUS LANCETS 26G MISC 1 each by Does not apply route every 8 (eight) hours as needed. 100 each 12  . liraglutide (VICTOZA) 18 MG/3ML SOPN Inject subcutaneously in the morning 0.6 mg daily for 1 week then 1.2 mg daily for 1 week then 1.8 mg daily thereafter 30 mL 3  . omega-3 acid ethyl esters (LOVAZA) 1 g capsule Take 1 capsule (1 g total) by mouth 2 (two) times daily. (Patient not taking: Reported on 05/21/2017) 60 capsule 5  . triamcinolone cream (KENALOG) 0.1 % Apply 1 application topically 2 (two) times daily. (Patient not taking: Reported on 05/21/2017) 30 g 2   No facility-administered medications prior to visit.  ROS Review of Systems  Constitutional: Negative for activity change and appetite change.  HENT: Negative for sinus pressure and sore throat.   Eyes: Negative for visual disturbance.  Respiratory: Negative for cough, chest tightness and shortness of breath.   Cardiovascular: Negative for chest pain and leg swelling.  Gastrointestinal: Negative for abdominal distention, abdominal pain, constipation and diarrhea.  Endocrine: Negative.   Genitourinary: Negative for dysuria.  Musculoskeletal: Negative for joint swelling and myalgias.  Skin: Negative for rash.  Allergic/Immunologic: Negative.   Neurological: Negative for weakness, light-headedness and numbness.  Psychiatric/Behavioral: Negative for dysphoric  mood and suicidal ideas.    Objective:  BP 140/75   Pulse 82   Temp 97.9 F (36.6 C) (Oral)   Ht '6\' 4"'  (1.93 m)   Wt (!) 380 lb (172.4 kg)   SpO2 95%   BMI 46.26 kg/m   BP/Weight 06/25/2017 05/21/2017 07/57/3225  Systolic BP 672 091 980  Diastolic BP 75 87 74  Wt. (Lbs) 380 396 397.8  BMI 46.26 48.2 48.42      Physical Exam  Constitutional: He is oriented to person, place, and time. He appears well-developed and well-nourished.  Obese  Cardiovascular: Normal rate, normal heart sounds and intact distal pulses.  No murmur heard. Pulmonary/Chest: Effort normal and breath sounds normal. He has no wheezes. He has no rales. He exhibits no tenderness.  Abdominal: Soft. Bowel sounds are normal. He exhibits no distension and no mass. There is no tenderness.  Musculoskeletal: Normal range of motion.  Neurological: He is alert and oriented to person, place, and time.    Lab Results  Component Value Date   HGBA1C 8.7 05/21/2017    Assessment & Plan:   1. Type 2 diabetes mellitus with diabetic polyneuropathy, with long-term current use of insulin (HCC) Uncontrolled with A1c of 8.7 but blood sugar log reveals improvement Discussed signs and symptoms of hypoglycemia and treatment He knows to reduce his Levemir dose by 2 units in the event of hypoglycemia Continue diabetic diet and lifestyle modifications - POCT glucose (manual entry) - liraglutide (VICTOZA) 18 MG/3ML SOPN; Inject 0.3 mLs (1.8 mg total) into the skin daily.  Dispense: 30 mL; Refill: 3  2. Obesity, morbid ([place]) Commended on weight loss of 16 pounds Continue to cut back on calories, increase physical activity and avoid late meals.   Meds ordered this encounter  Medications  . liraglutide (VICTOZA) 18 MG/3ML SOPN    Sig: Inject 0.3 mLs (1.8 mg total) into the skin daily.    Dispense:  30 mL    Refill:  3    Discontinue NovoLog    Follow-up: Return in about 3 months (around 09/24/2017) for follow up on  diabetes mellitus.   Charlott Rakes MD

## 2017-06-25 NOTE — Patient Instructions (Signed)
Diabetes Mellitus and Nutrition When you have diabetes (diabetes mellitus), it is very important to have healthy eating habits because your blood sugar (glucose) levels are greatly affected by what you eat and drink. Eating healthy foods in the appropriate amounts, at about the same times every day, can help you:  Control your blood glucose.  Lower your risk of heart disease.  Improve your blood pressure.  Reach or maintain a healthy weight.  Every person with diabetes is different, and each person has different needs for a meal plan. Your health care provider may recommend that you work with a diet and nutrition specialist (dietitian) to make a meal plan that is best for you. Your meal plan may vary depending on factors such as:  The calories you need.  The medicines you take.  Your weight.  Your blood glucose, blood pressure, and cholesterol levels.  Your activity level.  Other health conditions you have, such as heart or kidney disease.  How do carbohydrates affect me? Carbohydrates affect your blood glucose level more than any other type of food. Eating carbohydrates naturally increases the amount of glucose in your blood. Carbohydrate counting is a method for keeping track of how many carbohydrates you eat. Counting carbohydrates is important to keep your blood glucose at a healthy level, especially if you use insulin or take certain oral diabetes medicines. It is important to know how many carbohydrates you can safely have in each meal. This is different for every person. Your dietitian can help you calculate how many carbohydrates you should have at each meal and for snack. Foods that contain carbohydrates include:  Bread, cereal, rice, pasta, and crackers.  Potatoes and corn.  Peas, beans, and lentils.  Milk and yogurt.  Fruit and juice.  Desserts, such as cakes, cookies, ice cream, and candy.  How does alcohol affect me? Alcohol can cause a sudden decrease in blood  glucose (hypoglycemia), especially if you use insulin or take certain oral diabetes medicines. Hypoglycemia can be a life-threatening condition. Symptoms of hypoglycemia (sleepiness, dizziness, and confusion) are similar to symptoms of having too much alcohol. If your health care provider says that alcohol is safe for you, follow these guidelines:  Limit alcohol intake to no more than 1 drink per day for nonpregnant women and 2 drinks per day for men. One drink equals 12 oz of beer, 5 oz of wine, or 1 oz of hard liquor.  Do not drink on an empty stomach.  Keep yourself hydrated with water, diet soda, or unsweetened iced tea.  Keep in mind that regular soda, juice, and other mixers may contain a lot of sugar and must be counted as carbohydrates.  What are tips for following this plan? Reading food labels  Start by checking the serving size on the label. The amount of calories, carbohydrates, fats, and other nutrients listed on the label are based on one serving of the food. Many foods contain more than one serving per package.  Check the total grams (g) of carbohydrates in one serving. You can calculate the number of servings of carbohydrates in one serving by dividing the total carbohydrates by 15. For example, if a food has 30 g of total carbohydrates, it would be equal to 2 servings of carbohydrates.  Check the number of grams (g) of saturated and trans fats in one serving. Choose foods that have low or no amount of these fats.  Check the number of milligrams (mg) of sodium in one serving. Most people   should limit total sodium intake to less than 2,300 mg per day.  Always check the nutrition information of foods labeled as "low-fat" or "nonfat". These foods may be higher in added sugar or refined carbohydrates and should be avoided.  Talk to your dietitian to identify your daily goals for nutrients listed on the label. Shopping  Avoid buying canned, premade, or processed foods. These  foods tend to be high in fat, sodium, and added sugar.  Shop around the outside edge of the grocery store. This includes fresh fruits and vegetables, bulk grains, fresh meats, and fresh dairy. Cooking  Use low-heat cooking methods, such as baking, instead of high-heat cooking methods like deep frying.  Cook using healthy oils, such as olive, canola, or sunflower oil.  Avoid cooking with butter, cream, or high-fat meats. Meal planning  Eat meals and snacks regularly, preferably at the same times every day. Avoid going long periods of time without eating.  Eat foods high in fiber, such as fresh fruits, vegetables, beans, and whole grains. Talk to your dietitian about how many servings of carbohydrates you can eat at each meal.  Eat 4-6 ounces of lean protein each day, such as lean meat, chicken, fish, eggs, or tofu. 1 ounce is equal to 1 ounce of meat, chicken, or fish, 1 egg, or 1/4 cup of tofu.  Eat some foods each day that contain healthy fats, such as avocado, nuts, seeds, and fish. Lifestyle   Check your blood glucose regularly.  Exercise at least 30 minutes 5 or more days each week, or as told by your health care provider.  Take medicines as told by your health care provider.  Do not use any products that contain nicotine or tobacco, such as cigarettes and e-cigarettes. If you need help quitting, ask your health care provider.  Work with a counselor or diabetes educator to identify strategies to manage stress and any emotional and social challenges. What are some questions to ask my health care provider?  Do I need to meet with a diabetes educator?  Do I need to meet with a dietitian?  What number can I call if I have questions?  When are the best times to check my blood glucose? Where to find more information:  American Diabetes Association: diabetes.org/food-and-fitness/food  Academy of Nutrition and Dietetics:  www.eatright.org/resources/health/diseases-and-conditions/diabetes  National Institute of Diabetes and Digestive and Kidney Diseases (NIH): www.niddk.nih.gov/health-information/diabetes/overview/diet-eating-physical-activity Summary  A healthy meal plan will help you control your blood glucose and maintain a healthy lifestyle.  Working with a diet and nutrition specialist (dietitian) can help you make a meal plan that is best for you.  Keep in mind that carbohydrates and alcohol have immediate effects on your blood glucose levels. It is important to count carbohydrates and to use alcohol carefully. This information is not intended to replace advice given to you by your health care provider. Make sure you discuss any questions you have with your health care provider. Document Released: 11/14/2004 Document Revised: 03/24/2016 Document Reviewed: 03/24/2016 Elsevier Interactive Patient Education  2018 [employer]  

## 2017-07-06 MED FILL — OMEGA-3 ETHYL ESTERS 1 GM C: 1 | 30 days supply | Qty: 60 | Fill #5

## 2017-07-13 MED FILL — MELOXICAM 15 MG TABLET: 15 | 30 days supply | Qty: 30 | Fill #1

## 2017-07-13 MED FILL — glipiZIDE 5 MG TABS: 5 | 30 days supply | Qty: 60 | Fill #2

## 2017-07-13 MED FILL — LISINOPRIL-HCTZ 20-25 MG TA: 20-25 | 30 days supply | Qty: 30 | Fill #2

## 2017-07-13 MED FILL — ?ATORVASTATIN 40MG TABLET: 40 | 30 days supply | Qty: 30 | Fill #2

## 2017-07-20 MED FILL — $VICTOZA 2-PAK 18MG/3ML PEN: 18 | 30 days supply | Qty: 9 | Fill #3

## 2017-07-20 MED FILL — ?METFORMIN HCL 1,000 MG TAB: 1000 | 30 days supply | Qty: 60 | Fill #2

## 2017-08-03 ENCOUNTER — Other Ambulatory Visit: Payer: Self-pay

## 2017-08-03 DIAGNOSIS — E785 Hyperlipidemia, unspecified: Principal | ICD-10-CM

## 2017-08-03 DIAGNOSIS — E1169 Type 2 diabetes mellitus with other specified complication: Secondary | ICD-10-CM

## 2017-08-03 MED ORDER — OMEGA-3-ACID ETHYL ESTERS 1 G PO CAPS: 1.0000 g | ORAL_CAPSULE | Freq: Two times a day (BID) | ORAL | 3 refills | Status: DC

## 2017-08-03 MED FILL — OMEGA-3 ETHYL ESTERS 1 GM C: 1 | 30 days supply | Qty: 60 | Fill #0

## 2017-08-10 MED FILL — ?ATORVASTATIN 40MG TABLET: 40 | 30 days supply | Qty: 30 | Fill #3

## 2017-08-17 MED FILL — glipiZIDE 5 MG TABS: 5 | 30 days supply | Qty: 60 | Fill #3

## 2017-08-17 MED FILL — LISINOPRIL-HCTZ 20-25 MG TA: 20-25 | 30 days supply | Qty: 30 | Fill #3

## 2017-08-17 MED FILL — MELOXICAM 15 MG TABLET: 15 | 30 days supply | Qty: 30 | Fill #2

## 2017-08-24 MED FILL — ?METFORMIN HCL 1,000 MG TAB: 1000 | 30 days supply | Qty: 60 | Fill #3

## 2017-08-24 MED FILL — $VICTOZA 2-PAK 18MG/3ML PEN: 18 | 28 days supply | Qty: 9 | Fill #4

## 2017-08-31 MED FILL — OMEGA-3 ETHYL ESTERS 1 GM C: 1 | 30 days supply | Qty: 60 | Fill #1

## 2017-09-14 ENCOUNTER — Other Ambulatory Visit: Payer: Self-pay | Admitting: Family Medicine

## 2017-09-14 DIAGNOSIS — Z794 Long term (current) use of insulin: Secondary | ICD-10-CM

## 2017-09-14 DIAGNOSIS — E785 Hyperlipidemia, unspecified: Secondary | ICD-10-CM

## 2017-09-14 DIAGNOSIS — I10 Essential (primary) hypertension: Secondary | ICD-10-CM

## 2017-09-14 DIAGNOSIS — E1142 Type 2 diabetes mellitus with diabetic polyneuropathy: Secondary | ICD-10-CM

## 2017-09-14 MED FILL — ?GLIPIZIDE 5MG TABLET: 5 | 30 days supply | Qty: 60 | Fill #0

## 2017-09-14 MED FILL — $LEVEMIR 100U/ML VIAL: 100 | 42 days supply | Qty: 10 | Fill #1

## 2017-09-14 MED FILL — ?LISINOPRIL-HCTZ 20/25 TAB: 20-25 | 30 days supply | Qty: 30 | Fill #0

## 2017-09-14 MED FILL — ?ATORVASTATIN 40MG TABLET: 40 | 30 days supply | Qty: 30 | Fill #0

## 2017-09-14 MED FILL — MELOXICAM 15 MG TABLET: 15 | 30 days supply | Qty: 30 | Fill #3

## 2017-09-14 MED FILL — GABAPENTIN 300 MG CAPSULE: 300 | 30 days supply | Qty: 90 | Fill #1

## 2017-09-15 ENCOUNTER — Other Ambulatory Visit: Payer: Self-pay

## 2017-09-15 DIAGNOSIS — Z794 Long term (current) use of insulin: Principal | ICD-10-CM

## 2017-09-15 DIAGNOSIS — E1142 Type 2 diabetes mellitus with diabetic polyneuropathy: Secondary | ICD-10-CM

## 2017-09-15 MED ORDER — GLUCOSE BLOOD VI STRP: ORAL_STRIP | 1 refills | Status: DC

## 2017-09-15 MED FILL — TRUE METRIX TEST STRIP: 30 days supply | Qty: 100 | Fill #0

## 2017-09-21 MED FILL — $VICTOZA 2-PAK 18MG/3ML PEN: 18 | 28 days supply | Qty: 9 | Fill #5

## 2017-09-21 MED FILL — ?METFORMIN HCL 1,000 MG TAB: 1000 | 30 days supply | Qty: 60 | Fill #0

## 2017-09-28 MED FILL — OMEGA-3 ETHYL ESTERS 1 GM C: 1 | 30 days supply | Qty: 60 | Fill #2

## 2017-10-01 ENCOUNTER — Ambulatory Visit: Payer: Self-pay | Attending: Family Medicine | Admitting: Family Medicine

## 2017-10-01 ENCOUNTER — Other Ambulatory Visit: Payer: Self-pay | Admitting: Family Medicine

## 2017-10-01 VITALS — BP 121/77 | HR 78 | Temp 97.8°F | Ht 76.0 in | Wt 371.8 lb

## 2017-10-01 DIAGNOSIS — M4726 Other spondylosis with radiculopathy, lumbar region: Secondary | ICD-10-CM

## 2017-10-01 DIAGNOSIS — M25512 Pain in left shoulder: Secondary | ICD-10-CM | POA: Insufficient documentation

## 2017-10-01 DIAGNOSIS — E1142 Type 2 diabetes mellitus with diabetic polyneuropathy: Secondary | ICD-10-CM | POA: Insufficient documentation

## 2017-10-01 DIAGNOSIS — E119 Type 2 diabetes mellitus without complications: Secondary | ICD-10-CM | POA: Insufficient documentation

## 2017-10-01 DIAGNOSIS — E785 Hyperlipidemia, unspecified: Secondary | ICD-10-CM

## 2017-10-01 DIAGNOSIS — Z794 Long term (current) use of insulin: Secondary | ICD-10-CM | POA: Insufficient documentation

## 2017-10-01 DIAGNOSIS — E669 Obesity, unspecified: Secondary | ICD-10-CM | POA: Insufficient documentation

## 2017-10-01 DIAGNOSIS — Z7982 Long term (current) use of aspirin: Secondary | ICD-10-CM | POA: Insufficient documentation

## 2017-10-01 DIAGNOSIS — I10 Essential (primary) hypertension: Secondary | ICD-10-CM | POA: Insufficient documentation

## 2017-10-01 DIAGNOSIS — Z7952 Long term (current) use of systemic steroids: Secondary | ICD-10-CM | POA: Insufficient documentation

## 2017-10-01 DIAGNOSIS — Z791 Long term (current) use of non-steroidal anti-inflammatories (NSAID): Secondary | ICD-10-CM | POA: Insufficient documentation

## 2017-10-01 DIAGNOSIS — Z79899 Other long term (current) drug therapy: Secondary | ICD-10-CM | POA: Insufficient documentation

## 2017-10-01 DIAGNOSIS — Z1211 Encounter for screening for malignant neoplasm of colon: Secondary | ICD-10-CM

## 2017-10-01 LAB — POCT GLYCOSYLATED HEMOGLOBIN (HGB A1C): HbA1c, POC (controlled diabetic range): 7.6 % — AB (ref 0.0–7.0)

## 2017-10-01 LAB — GLUCOSE, POCT (MANUAL RESULT ENTRY): POC Glucose: 107 mg/dl — AB (ref 70–99)

## 2017-10-01 MED ORDER — LISINOPRIL-HYDROCHLOROTHIAZIDE 20-25 MG PO TABS: 1.0000 | ORAL_TABLET | Freq: Every day | ORAL | 6 refills | Status: DC

## 2017-10-01 MED ORDER — METFORMIN HCL 1000 MG PO TABS: 1000.0000 mg | ORAL_TABLET | Freq: Two times a day (BID) | ORAL | 6 refills | Status: DC

## 2017-10-01 MED ORDER — INSULIN DETEMIR 100 UNIT/ML ~~LOC~~ SOLN: 20.0000 [IU] | Freq: Every day | SUBCUTANEOUS | 6 refills | Status: DC

## 2017-10-01 MED ORDER — LIRAGLUTIDE 18 MG/3ML ~~LOC~~ SOPN: 1.8000 mg | PEN_INJECTOR | Freq: Every day | SUBCUTANEOUS | 6 refills | Status: DC

## 2017-10-01 MED ORDER — GABAPENTIN 300 MG PO CAPS
300.0000 mg | ORAL_CAPSULE | Freq: Three times a day (TID) | ORAL | 3 refills | Status: DC
Start: 2017-10-01 — End: 2018-01-07

## 2017-10-01 MED ORDER — GLIPIZIDE 5 MG PO TABS: ORAL_TABLET | ORAL | 6 refills | Status: DC

## 2017-10-01 MED ORDER — METHOCARBAMOL 500 MG PO TABS: 500.0000 mg | ORAL_TABLET | Freq: Three times a day (TID) | ORAL | 1 refills | Status: DC | PRN

## 2017-10-01 MED FILL — METHOCARBAMOL 500 MG TABS: 500 | 30 days supply | Qty: 90 | Fill #0

## 2017-10-01 NOTE — Progress Notes (Signed)
Subjective:  Patient ID: Dillon Mcgee, male    DOB: [birth year]  Age: 57 y.o. MRN: [id number]  CC: Diabetes   HPI Dillon Mcgee is a 57 year old male with a history of hypertension, type 2 diabetes mellitus (A1c 7.6), hyperlipidemia, obesity here for follow-up visit.   His A1c is 7.6 which has improved from 8.7 previously and he is doing well with Victoza and has noticed a weight loss of 9 pounds in the last 4 months.  He denies blurry vision or numbness in extremities. He is doing well on his antihypertensive and his statin with no complaints of myalgias or other adverse effects. He has had a 2 to 3-week history of left shoulder pain especially when he lifts his arm above a certain degree and is unable to elevate his arm over his head.  He has been doing a lot of heavy lifting at his place of work.  Denies numb sensation in his hand and has not been dropping things. With regards to healthcare maintenance he has been unable to afford the co-pay for colonoscopy.  Past Medical History:  Diagnosis Date  . Diabetes mellitus without complication ([place])   . Hyperlipidemia   . Hypertension     Past Surgical History:  Procedure Laterality Date  . COLONOSCOPY     5 years ago     Allergies  Allergen Reactions  . Pioglitazone Swelling    Lower extremity swelling.     Outpatient Medications Prior to Visit  Medication Sig Dispense Refill  . acetaminophen (TYLENOL) 500 MG tablet Take 2 tablets (1,000 mg total) by mouth every 6 (six) hours as needed for moderate pain. 30 tablet 0  . aspirin EC 81 MG tablet Take 1 tablet (81 mg total) daily by mouth. 90 tablet 3  . atorvastatin (LIPITOR) 40 MG tablet TAKE 1 TABLET BY MOUTH DAILY. 30 tablet 0  . Blood Glucose Monitoring Suppl (TRUE METRIX GO GLUCOSE METER) w/Device KIT 1 each by Does not apply route every 8 (eight) hours as needed. 1 kit 0  . ferrous sulfate 325 (65 FE) MG tablet Take 325 mg by mouth daily with breakfast.    . glucose  blood test strip Use as instructed 100 each 1  . Insulin Syringe-Needle U-100 (TRUEPLUS INSULIN SYRINGE) 30G X 5/16" 1 ML MISC Use as directed 4 times daily 300 each 3  . meloxicam (MOBIC) 15 MG tablet Take 1 tablet (15 mg total) by mouth daily. Take with food 90 tablet 3  . omega-3 acid ethyl esters (LOVAZA) 1 g capsule Take 1 capsule (1 g total) by mouth 2 (two) times daily. 60 capsule 3  . TRUEPLUS LANCETS 26G MISC 1 each by Does not apply route every 8 (eight) hours as needed. 100 each 12  . gabapentin (NEURONTIN) 300 MG capsule Take 1 capsule (300 mg total) by mouth 3 (three) times daily. 90 capsule 3  . glipiZIDE (GLUCOTROL) 5 MG tablet TAKE 1 TABLET BY MOUTH 2 TIMES DAILY BEFORE A MEAL. 60 tablet 0  . insulin detemir (LEVEMIR) 100 UNIT/ML injection Inject 0.2 mLs (20 Units total) into the skin at bedtime. 30 mL 3  . liraglutide (VICTOZA) 18 MG/3ML SOPN Inject 0.3 mLs (1.8 mg total) into the skin daily. 30 mL 3  . lisinopril-hydrochlorothiazide (PRINZIDE,ZESTORETIC) 20-25 MG tablet TAKE 1 TABLET BY MOUTH DAILY. 30 tablet 0  . metFORMIN (GLUCOPHAGE) 1000 MG tablet Take 1 tablet (1,000 mg total) by mouth 2 (two) times daily with a meal. 60 tablet 3  .  triamcinolone cream (KENALOG) 0.1 % Apply 1 application topically 2 (two) times daily. (Patient not taking: Reported on 10/01/2017) 30 g 2   No facility-administered medications prior to visit.     ROS Review of Systems  Constitutional: Negative for activity change and appetite change.  HENT: Negative for sinus pressure and sore throat.   Eyes: Negative for visual disturbance.  Respiratory: Negative for cough, chest tightness and shortness of breath.   Cardiovascular: Negative for chest pain and leg swelling.  Gastrointestinal: Negative for abdominal distention, abdominal pain, constipation and diarrhea.  Endocrine: Negative.   Genitourinary: Negative for dysuria.  Musculoskeletal:       See hpi  Skin: Negative for rash.    Allergic/Immunologic: Negative.   Neurological: Negative for weakness, light-headedness and numbness.  Psychiatric/Behavioral: Negative for dysphoric mood and suicidal ideas.    Objective:  BP 121/77   Pulse 78   Temp 97.8 F (36.6 C) (Oral)   Ht _0  (1.93 m)   Wt (!) 371 lb 12.8 oz (168.6 kg)   SpO2 96%   BMI 45.26 kg/m   BP/Weight 10/01/2017 06/25/2017 3/70/4888  Systolic BP 916 945 038  Diastolic BP 77 75 87  Wt. (Lbs) 371.8 380 396  BMI 45.26 46.26 48.2      Physical Exam  Constitutional: He is oriented to person, place, and time. He appears well-developed and well-nourished.  Neck: No JVD present.  Cardiovascular: Normal rate, normal heart sounds and intact distal pulses.  No murmur heard. Pulmonary/Chest: Effort normal and breath sounds normal. He has no wheezes. He has no rales. He exhibits no tenderness.  Abdominal: Soft. Bowel sounds are normal. He exhibits no distension and no mass. There is no tenderness.  Musculoskeletal:  Left upper extremity active range of motion restricted to 80 degrees with pain associated on range of motion beyond that point in the anterior aspect of shoulder  Neurological: He is alert and oriented to person, place, and time.  Skin:  Left shin scar with surrounding erythema, no discharge  Psychiatric: He has a normal mood and affect.     CMP Latest Ref Rng & Units 08/14/2016 12/03/2015 06/07/2015  Glucose 65 - 99 mg/dL 113(H) 95 161(H)  BUN 6 - 24 mg/dL _1 Creatinine 0.76 - 1.27 mg/dL 1.04 0.85 0.87  Sodium 134 - 144 mmol/L 146(H) 142 140  Potassium 3.5 - 5.2 mmol/L 5.4(H) 4.0 4.9  Chloride 96 - 106 mmol/L 106 105 103  CO2 20 - 29 mmol/L _2 Calcium 8.7 - 10.2 mg/dL 9.4 9.6 9.0  Total Protein 6.0 - 8.5 g/dL 6.2 - 6.4  Total Bilirubin 0.0 - 1.2 mg/dL <0.2 - 0.3  Alkaline Phos 39 - 117 IU/L 70 - 63  AST 0 - 40 IU/L 19 - 15  ALT 0 - 44 IU/L 16 - 16    Lipid Panel     Component Value Date/Time   CHOL 172 01/26/2017  0845   TRIG 160 (H) 01/26/2017 0845   HDL 42 01/26/2017 0845   CHOLHDL 4.1 01/26/2017 0845   CHOLHDL 3.5 06/07/2015 1005   VLDL 33 (H) 06/07/2015 1005   LDLCALC 98 01/26/2017 0845     Lab Results  Component Value Date   HGBA1C 7.6 (A) 10/01/2017     Assessment & Plan:   1. Type 2 diabetes mellitus with diabetic polyneuropathy, with long-term current use of insulin (HCC) Improved from A1c of 8.7 previously Continue current regimen Counseled on Diabetic diet,  my plate method, 379 minutes of moderate intensity exercise/week Keep blood sugar logs with fasting goals of 80-120 mg/dl, random of less than 180 and in the event of sugars less than 60 mg/dl or greater than 400 mg/dl please notify the clinic ASAP. It is recommended that you undergo annual eye exams and annual foot exams. Pneumonia vaccine is recommended. - POCT glucose (manual entry) - POCT glycosylated hemoglobin (Hb A1C) - CMP14+EGFR; Future - Microalbumin/Creatinine Ratio, Urine; Future - gabapentin (NEURONTIN) 300 MG capsule; Take 1 capsule (300 mg total) by mouth 3 (three) times daily.  Dispense: 90 capsule; Refill: 3 - glipiZIDE (GLUCOTROL) 5 MG tablet; TAKE 1 TABLET BY MOUTH 2 TIMES DAILY BEFORE A MEAL.  Dispense: 60 tablet; Refill: 6 - insulin detemir (LEVEMIR) 100 UNIT/ML injection; Inject 0.2 mLs (20 Units total) into the skin at bedtime.  Dispense: 30 mL; Refill: 6 - liraglutide (VICTOZA) 18 MG/3ML SOPN; Inject 0.3 mLs (1.8 mg total) into the skin daily.  Dispense: 30 mL; Refill: 6 - metFORMIN (GLUCOPHAGE) 1000 MG tablet; Take 1 tablet (1,000 mg total) by mouth 2 (two) times daily with a meal.  Dispense: 60 tablet; Refill: 6  2. Essential hypertension Controlled Counseled on blood pressure goal of less than 130/80, low-sodium, DASH diet, medication compliance, 150 minutes of moderate intensity exercise per week. Discussed medication compliance, adverse effects. - lisinopril-hydrochlorothiazide  (PRINZIDE,ZESTORETIC) 20-25 MG tablet; Take 1 tablet by mouth daily.  Dispense: 30 tablet; Refill: 6  3. Screening for colon cancer Unable to afford co-pay for colonoscopy - Fecal occult blood, imunochemical([facility]); Future  4. Osteoarthritis of spine with radiculopathy, lumbar region Currently on meloxicam  5. Hyperlipidemia, unspecified hyperlipidemia type Controlled Low-cholesterol diet - Lipid panel; Future  6. Acute pain of left shoulder Tendinopathy If symptoms persist will refer for physical therapy - methocarbamol (ROBAXIN) 500 MG tablet; Take 1 tablet (500 mg total) by mouth every 8 (eight) hours as needed for muscle spasms.  Dispense: 90 tablet; Refill: 1   Meds ordered this encounter  Medications  . methocarbamol (ROBAXIN) 500 MG tablet    Sig: Take 1 tablet (500 mg total) by mouth every 8 (eight) hours as needed for muscle spasms.    Dispense:  90 tablet    Refill:  1  . gabapentin (NEURONTIN) 300 MG capsule    Sig: Take 1 capsule (300 mg total) by mouth 3 (three) times daily.    Dispense:  90 capsule    Refill:  3  . glipiZIDE (GLUCOTROL) 5 MG tablet    Sig: TAKE 1 TABLET BY MOUTH 2 TIMES DAILY BEFORE A MEAL.    Dispense:  60 tablet    Refill:  6  . insulin detemir (LEVEMIR) 100 UNIT/ML injection    Sig: Inject 0.2 mLs (20 Units total) into the skin at bedtime.    Dispense:  30 mL    Refill:  6  . liraglutide (VICTOZA) 18 MG/3ML SOPN    Sig: Inject 0.3 mLs (1.8 mg total) into the skin daily.    Dispense:  30 mL    Refill:  6    Discontinue NovoLog  . lisinopril-hydrochlorothiazide (PRINZIDE,ZESTORETIC) 20-25 MG tablet    Sig: Take 1 tablet by mouth daily.    Dispense:  30 tablet    Refill:  6  . metFORMIN (GLUCOPHAGE) 1000 MG tablet    Sig: Take 1 tablet (1,000 mg total) by mouth 2 (two) times daily with a meal.    Dispense:  60 tablet  Refill:  6    Follow-up: Return in about 3 months (around 01/01/2018) for Follow-up of chronic medical  conditions.   Charlott Rakes MD

## 2017-10-01 NOTE — Patient Instructions (Signed)
Shoulder Pain Many things can cause shoulder pain, including:  An injury.  Moving the arm in the same way again and again (overuse).  Joint pain (arthritis).  Follow these instructions at home: Take these actions to help with your pain:  Squeeze a soft ball or a foam pad as much as you can. This helps to prevent swelling. It also makes the arm stronger.  Take over-the-counter and prescription medicines only as told by your doctor.  If told, put ice on the area: ? Put ice in a plastic bag. ? Place a towel between your skin and the bag. ? Leave the ice on for 20 minutes, 2-3 times per day. Stop putting on ice if it does not help with the pain.  If you were given a shoulder sling or immobilizer: ? Wear it as told. ? Remove it to shower or bathe. ? Move your arm as little as possible. ? Keep your hand moving. This helps prevent swelling.  Contact a doctor if:  Your pain gets worse.  Medicine does not help your pain.  You have new pain in your arm, hand, or fingers. Get help right away if:  Your arm, hand, or fingers: ? Tingle. ? Are numb. ? Are swollen. ? Are painful. ? Turn white or blue. This information is not intended to replace advice given to you by your health care provider. Make sure you discuss any questions you have with your health care provider. Document Released: 08/06/2007 Document Revised: 10/14/2015 Document Reviewed: 06/12/2014 Elsevier Interactive Patient Education  2018 [employer]  

## 2017-10-02 ENCOUNTER — Encounter: Payer: Self-pay | Admitting: Family Medicine

## 2017-10-04 LAB — FECAL OCCULT BLOOD, IMMUNOCHEMICAL: FECAL OCCULT BLD: NEGATIVE

## 2017-10-05 ENCOUNTER — Telehealth: Payer: Self-pay

## 2017-10-05 NOTE — Telephone Encounter (Signed)
-----   Message from Hoy RegisterEnobong Newlin, MD sent at 10/05/2017  8:48 AM EDT ----- Absence of blood in his stool test performed as a screening for colon malignancy.

## 2017-10-05 NOTE — Telephone Encounter (Signed)
Patient was called and informed of lab results. 

## 2017-10-08 ENCOUNTER — Ambulatory Visit: Payer: Self-pay | Attending: Family Medicine

## 2017-10-08 DIAGNOSIS — E785 Hyperlipidemia, unspecified: Secondary | ICD-10-CM

## 2017-10-08 DIAGNOSIS — Z794 Long term (current) use of insulin: Secondary | ICD-10-CM | POA: Insufficient documentation

## 2017-10-08 DIAGNOSIS — E1142 Type 2 diabetes mellitus with diabetic polyneuropathy: Secondary | ICD-10-CM | POA: Insufficient documentation

## 2017-10-08 NOTE — Progress Notes (Signed)
Patient here for lab visit only 

## 2017-10-09 LAB — CMP14+EGFR
ALBUMIN: 3.9 g/dL (ref 3.5–5.5)
ALT: 37 IU/L (ref 0–44)
AST: 32 IU/L (ref 0–40)
Albumin/Globulin Ratio: 1.7 (ref 1.2–2.2)
Alkaline Phosphatase: 79 IU/L (ref 39–117)
BILIRUBIN TOTAL: 0.4 mg/dL (ref 0.0–1.2)
BUN / CREAT RATIO: 20 (ref 9–20)
BUN: 26 mg/dL — AB (ref 6–24)
CALCIUM: 8.8 mg/dL (ref 8.7–10.2)
CHLORIDE: 108 mmol/L — AB (ref 96–106)
CO2: 23 mmol/L (ref 20–29)
CREATININE: 1.33 mg/dL — AB (ref 0.76–1.27)
GFR, EST AFRICAN AMERICAN: 69 mL/min/{1.73_m2} (ref >59)
GFR, EST NON AFRICAN AMERICAN: 60 mL/min/{1.73_m2} (ref >59)
GLUCOSE: 177 mg/dL — AB (ref 65–99)
Globulin, Total: 2.3 g/dL (ref 1.5–4.5)
Potassium: 5.2 mmol/L (ref 3.5–5.2)
Sodium: 144 mmol/L (ref 134–144)
TOTAL PROTEIN: 6.2 g/dL (ref 6.0–8.5)

## 2017-10-09 LAB — MICROALBUMIN / CREATININE URINE RATIO
CREATININE, UR: 66.4 mg/dL
MICROALBUM., U, RANDOM: 4.1 ug/mL
Microalb/Creat Ratio: 6.2 mg/g creat (ref 0.0–30.0)

## 2017-10-09 LAB — LIPID PANEL
Chol/HDL Ratio: 3.6 ratio (ref 0.0–5.0)
Cholesterol, Total: 129 mg/dL (ref 100–199)
HDL: 36 mg/dL — AB (ref >39)
LDL Calculated: 73 mg/dL (ref 0–99)
Triglycerides: 99 mg/dL (ref 0–149)
VLDL Cholesterol Cal: 20 mg/dL (ref 5–40)

## 2017-10-12 ENCOUNTER — Other Ambulatory Visit: Payer: Self-pay | Admitting: Family Medicine

## 2017-10-12 DIAGNOSIS — E785 Hyperlipidemia, unspecified: Secondary | ICD-10-CM

## 2017-10-12 MED FILL — MELOXICAM 15 MG TABLET: 15 | 30 days supply | Qty: 30 | Fill #4

## 2017-10-12 MED FILL — ?ATORVASTATIN 40MG TABLET: 40 | 30 days supply | Qty: 30 | Fill #0

## 2017-10-14 ENCOUNTER — Telehealth: Payer: Self-pay | Admitting: *Deleted

## 2017-10-14 NOTE — Telephone Encounter (Signed)
Left message on voicemail to return call.    Notes recorded by Hoy RegisterNewlin, [name], MD on 10/12/2017 at 8:09 AM EDT Total cholesterol is normal. Other labs are stable

## 2017-10-19 MED FILL — ?GLIPIZIDE 5MG TABLET: 5 | 30 days supply | Qty: 60 | Fill #0

## 2017-10-19 MED FILL — ?METFORMIN HCL 1000MG TABS: 1000 | 30 days supply | Qty: 60 | Fill #1

## 2017-10-19 MED FILL — ?LISINOPRIL-HCTZ 20/25 TAB: 20-25 | 30 days supply | Qty: 30 | Fill #0

## 2017-10-19 NOTE — Telephone Encounter (Signed)
Patient was called and informed of lab results. 

## 2017-10-19 NOTE — Telephone Encounter (Signed)
-----   Message from Hoy RegisterEnobong Newlin, MD sent at 10/12/2017  8:09 AM EDT ----- Total cholesterol is normal.  Other labs are stable

## 2017-10-26 MED FILL — $LEVEMIR 100U/ML VIAL: 100 | 42 days supply | Qty: 10 | Fill #2

## 2017-10-26 MED FILL — $VICTOZA 2-PAK 18MG/3ML PEN: 18 | 28 days supply | Qty: 9 | Fill #6

## 2017-11-03 ENCOUNTER — Other Ambulatory Visit: Payer: Self-pay | Admitting: Family Medicine

## 2017-11-03 DIAGNOSIS — E785 Hyperlipidemia, unspecified: Principal | ICD-10-CM

## 2017-11-03 DIAGNOSIS — E1169 Type 2 diabetes mellitus with other specified complication: Secondary | ICD-10-CM

## 2017-11-03 MED FILL — GABAPENTIN 300 MG CAPSULE: 300 | 30 days supply | Qty: 90 | Fill #2

## 2017-11-03 MED FILL — OMEGA-3 ETHYL ESTERS 1 GM C: 1 | 30 days supply | Qty: 60 | Fill #3

## 2017-11-09 MED FILL — MELOXICAM 15 MG TABLET: 15 | 30 days supply | Qty: 30 | Fill #5

## 2017-11-09 MED FILL — ?ATORVASTATIN 40MG TABLET: 40 | 30 days supply | Qty: 30 | Fill #1

## 2017-11-16 MED FILL — ?GLIPIZIDE 5MG TABLET: 5 | 30 days supply | Qty: 60 | Fill #1

## 2017-11-16 MED FILL — ?LISINOPRIL-HCTZ 20/25 TAB: 20-25 | 30 days supply | Qty: 30 | Fill #1

## 2017-11-23 MED FILL — $VICTOZA 2-PAK 18MG/3ML PEN: 18 | 28 days supply | Qty: 9 | Fill #7

## 2017-11-23 MED FILL — ?METFORMIN HCL 1000MG TABS: 1000 | 30 days supply | Qty: 60 | Fill #2

## 2017-11-25 ENCOUNTER — Other Ambulatory Visit: Payer: Self-pay

## 2017-11-25 DIAGNOSIS — Z794 Long term (current) use of insulin: Principal | ICD-10-CM

## 2017-11-25 DIAGNOSIS — E1142 Type 2 diabetes mellitus with diabetic polyneuropathy: Secondary | ICD-10-CM

## 2017-11-25 MED ORDER — INSULIN PEN NEEDLE 32G X 4 MM MISC: 3 refills | Status: DC

## 2017-11-25 MED ORDER — "INSULIN SYRINGE-NEEDLE U-100 30G X 5/16"" 1 ML MISC": 3 refills | Status: DC

## 2017-12-03 MED FILL — ?ATORVASTATIN 40MG TABLET: 40 | 30 days supply | Qty: 30 | Fill #2

## 2017-12-03 MED FILL — OMEGA-3 ETHYL ESTERS 1 GM C: 1 | 30 days supply | Qty: 60 | Fill #0

## 2017-12-09 MED FILL — LISINOPRIL-HCTZ 20-25 MG TA: 20-25 | 30 days supply | Qty: 30 | Fill #2

## 2017-12-09 MED FILL — MELOXICAM 15 MG TABLET: 15 | 30 days supply | Qty: 30 | Fill #6

## 2017-12-09 MED FILL — ?GLIPIZIDE 5MG TABLET: 5 | 30 days supply | Qty: 60 | Fill #2

## 2017-12-16 MED FILL — GABAPENTIN 300 MG CAPSULE: 300 | 30 days supply | Qty: 90 | Fill #3

## 2017-12-21 MED FILL — ?METFORMIN HCL 1000MG TABS: 1000 | 30 days supply | Qty: 60 | Fill #3

## 2017-12-28 MED FILL — OMEGA-3 ETHYL ESTERS 1 GM C: 1 | 30 days supply | Qty: 60 | Fill #1

## 2018-01-04 MED FILL — $VICTOZA 2-PAK 18MG/3ML PEN: 18 | 84 days supply | Qty: 27 | Fill #8

## 2018-01-04 MED FILL — TRUE METRIX TEST STRIP: 30 days supply | Qty: 100 | Fill #1

## 2018-01-04 MED FILL — $LEVEMIR 100U/ML VIAL: 100 | 84 days supply | Qty: 20 | Fill #3

## 2018-01-06 MED FILL — TRUEPLUS SYR 0.5ML 30GX5/16: 30G X 5/16" | 25 days supply | Qty: 100 | Fill #0

## 2018-01-07 ENCOUNTER — Encounter: Payer: Self-pay | Admitting: Family Medicine

## 2018-01-07 ENCOUNTER — Ambulatory Visit: Payer: Self-pay | Attending: Family Medicine | Admitting: Family Medicine

## 2018-01-07 VITALS — BP 123/74 | HR 84 | Temp 97.8°F | Ht 76.0 in | Wt 367.6 lb

## 2018-01-07 DIAGNOSIS — M79652 Pain in left thigh: Secondary | ICD-10-CM | POA: Insufficient documentation

## 2018-01-07 DIAGNOSIS — E1142 Type 2 diabetes mellitus with diabetic polyneuropathy: Secondary | ICD-10-CM

## 2018-01-07 DIAGNOSIS — Z6841 Body Mass Index (BMI) 40.0 and over, adult: Secondary | ICD-10-CM | POA: Insufficient documentation

## 2018-01-07 DIAGNOSIS — Z794 Long term (current) use of insulin: Secondary | ICD-10-CM | POA: Insufficient documentation

## 2018-01-07 DIAGNOSIS — E785 Hyperlipidemia, unspecified: Secondary | ICD-10-CM

## 2018-01-07 DIAGNOSIS — Z7982 Long term (current) use of aspirin: Secondary | ICD-10-CM | POA: Insufficient documentation

## 2018-01-07 DIAGNOSIS — E119 Type 2 diabetes mellitus without complications: Secondary | ICD-10-CM | POA: Insufficient documentation

## 2018-01-07 DIAGNOSIS — I10 Essential (primary) hypertension: Secondary | ICD-10-CM

## 2018-01-07 DIAGNOSIS — Z79899 Other long term (current) drug therapy: Secondary | ICD-10-CM | POA: Insufficient documentation

## 2018-01-07 DIAGNOSIS — M79651 Pain in right thigh: Secondary | ICD-10-CM

## 2018-01-07 LAB — POCT GLYCOSYLATED HEMOGLOBIN (HGB A1C): Hemoglobin A1C: 8.5 % — AB (ref 4.0–5.6)

## 2018-01-07 LAB — GLUCOSE, POCT (MANUAL RESULT ENTRY): POC Glucose: 178 mg/dl — AB (ref 70–99)

## 2018-01-07 MED ORDER — LIRAGLUTIDE 18 MG/3ML ~~LOC~~ SOPN: 1.8000 mg | PEN_INJECTOR | Freq: Every day | SUBCUTANEOUS | 6 refills | Status: DC

## 2018-01-07 MED ORDER — GABAPENTIN 300 MG PO CAPS: 300.0000 mg | ORAL_CAPSULE | Freq: Three times a day (TID) | ORAL | 3 refills | Status: DC

## 2018-01-07 MED ORDER — GLIPIZIDE 10 MG PO TABS: ORAL_TABLET | ORAL | 6 refills | Status: DC

## 2018-01-07 MED ORDER — MELOXICAM 15 MG PO TABS: 15.0000 mg | ORAL_TABLET | Freq: Every day | ORAL | 3 refills | Status: DC

## 2018-01-07 MED ORDER — LISINOPRIL-HYDROCHLOROTHIAZIDE 20-25 MG PO TABS: 1.0000 | ORAL_TABLET | Freq: Every day | ORAL | 6 refills | Status: DC

## 2018-01-07 MED ORDER — ATORVASTATIN CALCIUM 40 MG PO TABS: 40.0000 mg | ORAL_TABLET | Freq: Every day | ORAL | 6 refills | Status: DC

## 2018-01-07 MED ORDER — METFORMIN HCL 1000 MG PO TABS: 1000.0000 mg | ORAL_TABLET | Freq: Two times a day (BID) | ORAL | 6 refills | Status: DC

## 2018-01-07 MED FILL — glipiZIDE 10 MG TABS: 10 | 30 days supply | Qty: 60 | Fill #0

## 2018-01-07 NOTE — Progress Notes (Signed)
Subjective:  Patient ID: Dillon Mcgee, male    DOB: [birth year]  Age: 57 y.o. MRN: [id number]  CC: Diabetes   HPI Dillon Mcgee  is a 57 year old male with a history of hypertension, type 2 diabetes mellitus (A1c 8.5), hyperlipidemia, obesity here for follow-up visit.   His A1c is 8.5 which has trended up from 7.6 previously and he endorses compliance with his medications and is working on a diabetic diet.  He denies visual concerns, numbness in extremities or hypoglycemia.  Fasting sugars have been in the 96-232 range. He has noticed tightness in both thighs after working for about 4 to 5 hours and has to sit for relief of symptoms.  He denies cramps in other body parts.  He works in an [employer] and is doing heavy lifting of up to 40 to 50 pounds during his workday.  Thigh symptoms were noticed 3 to 4 weeks ago. He is tolerating his statin. Denies chest pains, shortness of breath or pedal edema.  Past Medical History:  Diagnosis Date  . Diabetes mellitus without complication ([place])   . Hyperlipidemia   . Hypertension     Past Surgical History:  Procedure Laterality Date  . COLONOSCOPY     5 years ago     Allergies  Allergen Reactions  . Pioglitazone Swelling    Lower extremity swelling.     Outpatient Medications Prior to Visit  Medication Sig Dispense Refill  . aspirin EC 81 MG tablet Take 1 tablet (81 mg total) daily by mouth. 90 tablet 3  . Blood Glucose Monitoring Suppl (TRUE METRIX GO GLUCOSE METER) w/Device KIT 1 each by Does not apply route every 8 (eight) hours as needed. 1 kit 0  . ferrous sulfate 325 (65 FE) MG tablet Take 325 mg by mouth daily with breakfast.    . glucose blood test strip Use as instructed 100 each 1  . insulin detemir (LEVEMIR) 100 UNIT/ML injection Inject 0.2 mLs (20 Units total) into the skin at bedtime. 30 mL 6  . Insulin Pen Needle (TRUEPLUS PEN NEEDLES) 32G X 4 MM MISC Use as directed once daily 100 each 3  . Insulin  Syringe-Needle U-100 (TRUEPLUS INSULIN SYRINGE) 30G X 5/16" 1 ML MISC Use as directed 4 times daily 300 each 3  . methocarbamol (ROBAXIN) 500 MG tablet Take 1 tablet (500 mg total) by mouth every 8 (eight) hours as needed for muscle spasms. 90 tablet 1  . omega-3 acid ethyl esters (LOVAZA) 1 g capsule TAKE 1 CAPSULE BY MOUTH 2 (TWO) TIMES DAILY. 60 capsule 2  . TRUEPLUS LANCETS 26G MISC 1 each by Does not apply route every 8 (eight) hours as needed. 100 each 12  . atorvastatin (LIPITOR) 40 MG tablet TAKE 1 TABLET BY MOUTH DAILY. 30 tablet 5  . gabapentin (NEURONTIN) 300 MG capsule Take 1 capsule (300 mg total) by mouth 3 (three) times daily. 90 capsule 3  . glipiZIDE (GLUCOTROL) 5 MG tablet TAKE 1 TABLET BY MOUTH 2 TIMES DAILY BEFORE A MEAL. 60 tablet 6  . liraglutide (VICTOZA) 18 MG/3ML SOPN Inject 0.3 mLs (1.8 mg total) into the skin daily. 30 mL 6  . lisinopril-hydrochlorothiazide (PRINZIDE,ZESTORETIC) 20-25 MG tablet Take 1 tablet by mouth daily. 30 tablet 6  . meloxicam (MOBIC) 15 MG tablet Take 1 tablet (15 mg total) by mouth daily. Take with food 90 tablet 3  . metFORMIN (GLUCOPHAGE) 1000 MG tablet Take 1 tablet (1,000 mg total) by mouth 2 (two) times daily  with a meal. 60 tablet 6  . acetaminophen (TYLENOL) 500 MG tablet Take 2 tablets (1,000 mg total) by mouth every 6 (six) hours as needed for moderate pain. (Patient not taking: Reported on 01/07/2018) 30 tablet 0  . triamcinolone cream (KENALOG) 0.1 % Apply 1 application topically 2 (two) times daily. (Patient not taking: Reported on 10/01/2017) 30 g 2   No facility-administered medications prior to visit.     ROS Review of Systems  Constitutional: Negative for activity change and appetite change.  HENT: Negative for sinus pressure and sore throat.   Eyes: Negative for visual disturbance.  Respiratory: Negative for cough, chest tightness and shortness of breath.   Cardiovascular: Negative for chest pain and leg swelling.    Gastrointestinal: Negative for abdominal distention, abdominal pain, constipation and diarrhea.  Endocrine: Negative.   Genitourinary: Negative for dysuria.  Musculoskeletal: Negative for joint swelling and myalgias.  Skin: Negative for rash.  Allergic/Immunologic: Negative.   Neurological: Negative for weakness, light-headedness and numbness.  Psychiatric/Behavioral: Negative for dysphoric mood and suicidal ideas.    Objective:  BP 123/74   Pulse 84   Temp 97.8 F (36.6 C) (Oral)   Ht _0  (1.93 m)   Wt (!) 367 lb 9.6 oz (166.7 kg)   SpO2 100%   BMI 44.75 kg/m   BP/Weight 01/07/2018 10/01/2017 0/10/6759  Systolic BP 950 932 671  Diastolic BP 74 77 75  Wt. (Lbs) 367.6 371.8 380  BMI 44.75 45.26 46.26      Physical Exam  Constitutional: He is oriented to person, place, and time. He appears well-developed and well-nourished.  Cardiovascular: Normal rate, normal heart sounds and intact distal pulses.  No murmur heard. Pulmonary/Chest: Effort normal and breath sounds normal. He has no wheezes. He has no rales. He exhibits no tenderness.  Abdominal: Soft. Bowel sounds are normal. He exhibits no distension and no mass. There is no tenderness.  Musculoskeletal: Normal range of motion.  Neurological: He is alert and oriented to person, place, and time.  Skin: Skin is warm and dry.     CMP Latest Ref Rng & Units 10/08/2017 08/14/2016 12/03/2015  Glucose 65 - 99 mg/dL 177(H) 113(H) 95  BUN 6 - 24 mg/dL 26(H) 18 18  Creatinine 0.76 - 1.27 mg/dL 1.33(H) 1.04 0.85  Sodium 134 - 144 mmol/L 144 146(H) 142  Potassium 3.5 - 5.2 mmol/L 5.2 5.4(H) 4.0  Chloride 96 - 106 mmol/L 108(H) 106 105  CO2 20 - 29 mmol/L _1 Calcium 8.7 - 10.2 mg/dL 8.8 9.4 9.6  Total Protein 6.0 - 8.5 g/dL 6.2 6.2 -  Total Bilirubin 0.0 - 1.2 mg/dL 0.4 <0.2 -  Alkaline Phos 39 - 117 IU/L 79 70 -  AST 0 - 40 IU/L 32 19 -  ALT 0 - 44 IU/L 37 16 -    Lipid Panel     Component Value Date/Time   CHOL  129 10/08/2017 0833   TRIG 99 10/08/2017 0833   HDL 36 (L) 10/08/2017 0833   CHOLHDL 3.6 10/08/2017 0833   CHOLHDL 3.5 06/07/2015 1005   VLDL 33 (H) 06/07/2015 1005   LDLCALC 73 10/08/2017 0833    Lab Results  Component Value Date   HGBA1C 8.5 (A) 01/07/2018    Assessment & Plan:   1. Type 2 diabetes mellitus with diabetic polyneuropathy, with long-term current use of insulin (HCC) Uncontrolled with A1c of 8.5 which has trended up from 7.6 Increase glipizide dose Emphasized the need to  comply with a diabetic diet Counseled on Diabetic diet, my plate method, 509 minutes of moderate intensity exercise/week Keep blood sugar logs with fasting goals of 80-120 mg/dl, random of less than 180 and in the event of sugars less than 60 mg/dl or greater than 400 mg/dl please notify the clinic ASAP. It is recommended that you undergo annual eye exams and annual foot exams. Pneumonia vaccine is recommended. - POCT glucose (manual entry) - POCT glycosylated hemoglobin (Hb A1C) - glipiZIDE (GLUCOTROL) 10 MG tablet; TAKE 1 TABLET BY MOUTH 2 TIMES DAILY BEFORE A MEAL.  Dispense: 60 tablet; Refill: 6 - gabapentin (NEURONTIN) 300 MG capsule; Take 1 capsule (300 mg total) by mouth 3 (three) times daily.  Dispense: 90 capsule; Refill: 3 - liraglutide (VICTOZA) 18 MG/3ML SOPN; Inject 0.3 mLs (1.8 mg total) into the skin daily.  Dispense: 30 mL; Refill: 6 - metFORMIN (GLUCOPHAGE) 1000 MG tablet; Take 1 tablet (1,000 mg total) by mouth 2 (two) times daily with a meal.  Dispense: 60 tablet; Refill: 6  2. Hyperlipidemia, unspecified hyperlipidemia type Controlled Low-cholesterol diet - atorvastatin (LIPITOR) 40 MG tablet; Take 1 tablet (40 mg total) by mouth daily.  Dispense: 30 tablet; Refill: 6  3. Essential hypertension Controlled Counseled on blood pressure goal of less than 130/80, low-sodium, DASH diet, medication compliance, 150 minutes of moderate intensity exercise per week. Discussed medication  compliance, adverse effects. - lisinopril-hydrochlorothiazide (PRINZIDE,ZESTORETIC) 20-25 MG tablet; Take 1 tablet by mouth daily.  Dispense: 30 tablet; Refill: 6  4. Pain in both thighs We will need to evaluate for myopathy given he is on statin Increase fluid intake - CK  5. Obesity, morbid ([place]) Reduce portion sizes, increase physical activity   Meds ordered this encounter  Medications  . glipiZIDE (GLUCOTROL) 10 MG tablet    Sig: TAKE 1 TABLET BY MOUTH 2 TIMES DAILY BEFORE A MEAL.    Dispense:  60 tablet    Refill:  6    Discontinue previous dose  . gabapentin (NEURONTIN) 300 MG capsule    Sig: Take 1 capsule (300 mg total) by mouth 3 (three) times daily.    Dispense:  90 capsule    Refill:  3  . atorvastatin (LIPITOR) 40 MG tablet    Sig: Take 1 tablet (40 mg total) by mouth daily.    Dispense:  30 tablet    Refill:  6  . liraglutide (VICTOZA) 18 MG/3ML SOPN    Sig: Inject 0.3 mLs (1.8 mg total) into the skin daily.    Dispense:  30 mL    Refill:  6    Discontinue NovoLog  . lisinopril-hydrochlorothiazide (PRINZIDE,ZESTORETIC) 20-25 MG tablet    Sig: Take 1 tablet by mouth daily.    Dispense:  30 tablet    Refill:  6  . meloxicam (MOBIC) 15 MG tablet    Sig: Take 1 tablet (15 mg total) by mouth daily. Take with food    Dispense:  90 tablet    Refill:  3  . metFORMIN (GLUCOPHAGE) 1000 MG tablet    Sig: Take 1 tablet (1,000 mg total) by mouth 2 (two) times daily with a meal.    Dispense:  60 tablet    Refill:  6    Follow-up: Return in about 3 months (around 04/09/2018) for Follow-up of chronic medical conditions.   Charlott Rakes MD

## 2018-01-07 NOTE — Patient Instructions (Signed)

## 2018-01-08 ENCOUNTER — Encounter: Payer: Self-pay | Admitting: Family Medicine

## 2018-01-08 LAB — CK: CK TOTAL: 89 U/L (ref 24–204)

## 2018-01-11 ENCOUNTER — Telehealth: Payer: Self-pay

## 2018-01-11 MED FILL — LISINOPRIL-HCTZ 20-25 MG TA: 20-25 | 30 days supply | Qty: 30 | Fill #3

## 2018-01-11 MED FILL — MELOXICAM 15 MG TABLET: 15 | 30 days supply | Qty: 30 | Fill #7

## 2018-01-11 MED FILL — ?ATORVASTATIN 40MG TABLET: 40 | 30 days supply | Qty: 30 | Fill #3

## 2018-01-11 NOTE — Telephone Encounter (Signed)
-----   Message from Hoy Register, MD sent at 01/08/2018  9:54 AM EST ----- Blood work is normal.  Thigh pain is unlikely to be from his cholesterol medication.  Please advise him to increase fluid intake

## 2018-01-11 NOTE — Telephone Encounter (Signed)
Patient was called and informed of lab results. 

## 2018-01-20 MED FILL — ?METFORMIN HCL 1000MG TABS: 1000 | 30 days supply | Qty: 60 | Fill #0

## 2018-02-01 MED FILL — OMEGA-3 ETHYL ESTERS 1 GM C: 1 | 30 days supply | Qty: 60 | Fill #2

## 2018-02-01 MED FILL — TRUEPLUS PEN NDL 32GX5/32: 32G X 4 MM | 30 days supply | Qty: 100 | Fill #0

## 2018-02-05 MED FILL — ATORVASTATIN CALCIUM 40 MG: 40 | 30 days supply | Qty: 30 | Fill #4

## 2018-02-05 MED FILL — MELOXICAM 15 MG TABLET: 15 | 30 days supply | Qty: 30 | Fill #8

## 2018-02-05 MED FILL — glipiZIDE 10 MG TABS: 10 | 30 days supply | Qty: 60 | Fill #1

## 2018-02-11 ENCOUNTER — Other Ambulatory Visit: Payer: Self-pay | Admitting: Family Medicine

## 2018-02-11 DIAGNOSIS — Z794 Long term (current) use of insulin: Principal | ICD-10-CM

## 2018-02-11 DIAGNOSIS — E1142 Type 2 diabetes mellitus with diabetic polyneuropathy: Secondary | ICD-10-CM

## 2018-02-11 MED FILL — LISINOPRIL-HCTZ 20-25 MG TA: 20-25 | 30 days supply | Qty: 30 | Fill #4

## 2018-02-12 MED FILL — GABAPENTIN 300 MG CAPSULE: 300 | 30 days supply | Qty: 90 | Fill #0

## 2018-02-18 MED FILL — ?METFORMIN HCL 1,000 MG TAB: 1000 | 30 days supply | Qty: 60 | Fill #1

## 2018-03-04 ENCOUNTER — Other Ambulatory Visit: Payer: Self-pay | Admitting: Family Medicine

## 2018-03-04 DIAGNOSIS — E1169 Type 2 diabetes mellitus with other specified complication: Secondary | ICD-10-CM

## 2018-03-04 DIAGNOSIS — E785 Hyperlipidemia, unspecified: Principal | ICD-10-CM

## 2018-03-08 MED FILL — OMEGA-3 ETHYL ESTERS 1 GM C: 1 | 30 days supply | Qty: 60 | Fill #0

## 2018-03-11 MED FILL — ATORVASTATIN CALCIUM 40 MG: 40 | 30 days supply | Qty: 30 | Fill #5

## 2018-03-11 MED FILL — LISINOPRIL-HCTZ 20-25 MG TA: 20-25 | 30 days supply | Qty: 30 | Fill #5

## 2018-03-11 MED FILL — MELOXICAM 15 MG TABLET: 15 | 30 days supply | Qty: 30 | Fill #9

## 2018-03-11 MED FILL — glipiZIDE 10 MG TABS: 10 | 30 days supply | Qty: 60 | Fill #2

## 2018-03-18 MED FILL — metFORMIN HCL 1000 MG TABS: 1000 | 30 days supply | Qty: 60 | Fill #2

## 2018-03-29 ENCOUNTER — Other Ambulatory Visit: Payer: Self-pay | Admitting: Family Medicine

## 2018-03-29 DIAGNOSIS — Z794 Long term (current) use of insulin: Principal | ICD-10-CM

## 2018-03-29 DIAGNOSIS — E1142 Type 2 diabetes mellitus with diabetic polyneuropathy: Secondary | ICD-10-CM

## 2018-03-29 MED FILL — TRUE METRIX TEST STRIP: 25 days supply | Qty: 100 | Fill #0

## 2018-03-29 MED FILL — $VICTOZA 2-PAK 18MG/3ML PEN: 18 | 84 days supply | Qty: 27 | Fill #9

## 2018-03-29 MED FILL — $LEVEMIR 100U/ML VIAL: 100 | 84 days supply | Qty: 20 | Fill #4

## 2018-03-31 MED FILL — GABAPENTIN 300 MG CAPSULE: 300 | 30 days supply | Qty: 90 | Fill #1

## 2018-04-05 MED FILL — OMEGA-3 ETHYL ESTERS 1 GM C: 1 | 30 days supply | Qty: 60 | Fill #1

## 2018-04-08 MED FILL — ATORVASTATIN CALCIUM 40 MG: 40 | 30 days supply | Qty: 30 | Fill #0

## 2018-04-08 MED FILL — glipiZIDE 10 MG TABS: 10 | 30 days supply | Qty: 60 | Fill #3

## 2018-04-13 ENCOUNTER — Encounter: Payer: Self-pay | Admitting: Family Medicine

## 2018-04-13 ENCOUNTER — Ambulatory Visit: Payer: Self-pay | Attending: Family Medicine | Admitting: Family Medicine

## 2018-04-13 VITALS — BP 113/70 | HR 76 | Temp 97.5°F | Ht 76.0 in | Wt 358.4 lb

## 2018-04-13 DIAGNOSIS — Z6841 Body Mass Index (BMI) 40.0 and over, adult: Secondary | ICD-10-CM | POA: Insufficient documentation

## 2018-04-13 DIAGNOSIS — E1142 Type 2 diabetes mellitus with diabetic polyneuropathy: Secondary | ICD-10-CM | POA: Insufficient documentation

## 2018-04-13 DIAGNOSIS — Z7982 Long term (current) use of aspirin: Secondary | ICD-10-CM | POA: Insufficient documentation

## 2018-04-13 DIAGNOSIS — Z79899 Other long term (current) drug therapy: Secondary | ICD-10-CM | POA: Insufficient documentation

## 2018-04-13 DIAGNOSIS — Z791 Long term (current) use of non-steroidal anti-inflammatories (NSAID): Secondary | ICD-10-CM | POA: Insufficient documentation

## 2018-04-13 DIAGNOSIS — Z794 Long term (current) use of insulin: Secondary | ICD-10-CM | POA: Insufficient documentation

## 2018-04-13 DIAGNOSIS — Z8249 Family history of ischemic heart disease and other diseases of the circulatory system: Secondary | ICD-10-CM | POA: Insufficient documentation

## 2018-04-13 DIAGNOSIS — E785 Hyperlipidemia, unspecified: Secondary | ICD-10-CM | POA: Insufficient documentation

## 2018-04-13 DIAGNOSIS — I10 Essential (primary) hypertension: Secondary | ICD-10-CM | POA: Insufficient documentation

## 2018-04-13 LAB — GLUCOSE, POCT (MANUAL RESULT ENTRY): POC Glucose: 110 mg/dl — AB (ref 70–99)

## 2018-04-13 MED ORDER — GABAPENTIN 300 MG PO CAPS: 300.0000 mg | ORAL_CAPSULE | Freq: Three times a day (TID) | ORAL | 6 refills | Status: DC

## 2018-04-13 MED FILL — GABAPENTIN 300 MG CAPSULE: 300 | 30 days supply | Qty: 90 | Fill #0

## 2018-04-13 NOTE — Progress Notes (Signed)
Subjective:  Patient ID: Dillon Mcgee, male    DOB: 24-Feb-1961  Age: 58 y.o. MRN: [id number]  CC: Diabetes   HPI Dillon Mcgee  is a 58 year old male with a history of hypertension, type 2 diabetes mellitus (A1c 8.5), hyperlipidemia, obesity here for follow-up visit.   He has lost 7 pounds in the last 3 months and has been working on cutting back his portion sizes.  His major form of exercise it is at his place of work where his job involves heavy lifting and frequent walking.  Denies visual concerns, numbness in extremities or side effects from his medication. Compliant with his statin and denies myalgias; also compliant with his antihypertensive. Unable to undergo an annual eye exam with an ophthalmologist due to lack of finances He is yet to have a colonoscopy but had negative FIT test in 10/2017  Past Medical History:  Diagnosis Date  . Diabetes mellitus without complication ([place])   . Hyperlipidemia   . Hypertension     Past Surgical History:  Procedure Laterality Date  . COLONOSCOPY     5 years ago     Family History  Problem Relation Age of Onset  . Heart disease Maternal Aunt   . Diabetes Maternal Uncle     Allergies  Allergen Reactions  . Pioglitazone Swelling    Lower extremity swelling.    Outpatient Medications Prior to Visit  Medication Sig Dispense Refill  . acetaminophen (TYLENOL) 500 MG tablet Take 2 tablets (1,000 mg total) by mouth every 6 (six) hours as needed for moderate pain. 30 tablet 0  . aspirin EC 81 MG tablet Take 1 tablet (81 mg total) daily by mouth. 90 tablet 3  . atorvastatin (LIPITOR) 40 MG tablet Take 1 tablet (40 mg total) by mouth daily. 30 tablet 6  . Blood Glucose Monitoring Suppl (TRUE METRIX GO GLUCOSE METER) w/Device KIT 1 each by Does not apply route every 8 (eight) hours as needed. 1 kit 0  . ferrous sulfate 325 (65 FE) MG tablet Take 325 mg by mouth daily with breakfast.    . glipiZIDE (GLUCOTROL) 10 MG tablet TAKE 1 TABLET  BY MOUTH 2 TIMES DAILY BEFORE A MEAL. 60 tablet 6  . glucose blood test strip Use as instructed 100 each 1  . insulin detemir (LEVEMIR) 100 UNIT/ML injection Inject 0.2 mLs (20 Units total) into the skin at bedtime. 30 mL 6  . Insulin Pen Needle (TRUEPLUS PEN NEEDLES) 32G X 4 MM MISC Use as directed once daily 100 each 3  . Insulin Syringe-Needle U-100 (TRUEPLUS INSULIN SYRINGE) 30G X 5/16" 1 ML MISC Use as directed 4 times daily 300 each 3  . liraglutide (VICTOZA) 18 MG/3ML SOPN Inject 0.3 mLs (1.8 mg total) into the skin daily. 30 mL 6  . lisinopril-hydrochlorothiazide (PRINZIDE,ZESTORETIC) 20-25 MG tablet Take 1 tablet by mouth daily. 30 tablet 6  . meloxicam (MOBIC) 15 MG tablet Take 1 tablet (15 mg total) by mouth daily. Take with food 90 tablet 3  . metFORMIN (GLUCOPHAGE) 1000 MG tablet Take 1 tablet (1,000 mg total) by mouth 2 (two) times daily with a meal. 60 tablet 6  . methocarbamol (ROBAXIN) 500 MG tablet Take 1 tablet (500 mg total) by mouth every 8 (eight) hours as needed for muscle spasms. 90 tablet 1  . omega-3 acid ethyl esters (LOVAZA) 1 g capsule TAKE 1 CAPSULE BY MOUTH 2 TIMES DAILY. 60 capsule 2  . TRUEPLUS LANCETS 26G MISC 1 each by Does not  apply route every 8 (eight) hours as needed. 100 each 12  . gabapentin (NEURONTIN) 300 MG capsule TAKE 1 CAPSULE BY MOUTH 3 TIMES DAILY. 90 capsule 3  . triamcinolone cream (KENALOG) 0.1 % Apply 1 application topically 2 (two) times daily. (Patient not taking: Reported on 10/01/2017) 30 g 2   No facility-administered medications prior to visit.      ROS Review of Systems General: negative for fever, weight loss, appetite change Eyes: no visual symptoms. ENT: no ear symptoms, no sinus tenderness, no nasal congestion or sore throat. Neck: no pain  Respiratory: no wheezing, shortness of breath, cough Cardiovascular: no chest pain, no dyspnea on exertion, no pedal edema, no orthopnea. Gastrointestinal: no abdominal pain, no diarrhea, no  constipation Genito-Urinary: no urinary frequency, no dysuria, no polyuria. Hematologic: no bruising Endocrine: no cold or heat intolerance Neurological: no headaches, no seizures, no tremors Musculoskeletal: no joint pains, no joint swelling Skin: no pruritus, no rash. Psychological: no depression, no anxiety,    Objective:  BP 113/70   Pulse 76   Temp (!) 97.5 F (36.4 C) (Oral)   Ht '6\' 4"'  (1.93 m)   Wt (!) 358 lb 6.4 oz (162.6 kg)   SpO2 100%   BMI 43.63 kg/m   BP/Weight 04/13/2018 25/11/5636 09/05/6431  Systolic BP 295 188 416  Diastolic BP 70 74 77  Wt. (Lbs) 358.4 367.6 371.8  BMI 43.63 44.75 45.26    Wt Readings from Last 3 Encounters:  04/13/18 (!) 358 lb 6.4 oz (162.6 kg)  01/07/18 (!) 367 lb 9.6 oz (166.7 kg)  10/01/17 (!) 371 lb 12.8 oz (168.6 kg)     Physical Exam  Constitutional: Morbidly obese Eyes: PERRLA HEENT: Head is atraumatic, normal sinuses, normal oropharynx, normal appearing tonsils and palate, tympanic membrane is normal bilaterally. Neck: normal range of motion, no thyromegaly, no JVD Cardiovascular: normal rate and rhythm, normal heart sounds, no murmurs, rub or gallop, no pedal edema Respiratory: Normal breath sounds, clear to auscultation bilaterally, no wheezes, no rales, no rhonchi Abdomen: soft, not tender to palpation, normal bowel sounds, no enlarged organs Musculoskeletal: Full ROM, no tenderness in joints Skin: warm and dry, no lesions. Neurological: alert, oriented x3, cranial nerves I-XII grossly intact , normal motor strength, normal sensation. Psychological: normal mood.   CMP Latest Ref Rng & Units 10/08/2017 08/14/2016 12/03/2015  Glucose 65 - 99 mg/dL 177(H) 113(H) 95  BUN 6 - 24 mg/dL 26(H) 18 18  Creatinine 0.76 - 1.27 mg/dL 1.33(H) 1.04 0.85  Sodium 134 - 144 mmol/L 144 146(H) 142  Potassium 3.5 - 5.2 mmol/L 5.2 5.4(H) 4.0  Chloride 96 - 106 mmol/L 108(H) 106 105  CO2 20 - 29 mmol/L '23 26 26  ' Calcium 8.7 - 10.2 mg/dL 8.8  9.4 9.6  Total Protein 6.0 - 8.5 g/dL 6.2 6.2 -  Total Bilirubin 0.0 - 1.2 mg/dL 0.4 <0.2 -  Alkaline Phos 39 - 117 IU/L 79 70 -  AST 0 - 40 IU/L 32 19 -  ALT 0 - 44 IU/L 37 16 -    Lipid Panel     Component Value Date/Time   CHOL 129 10/08/2017 0833   TRIG 99 10/08/2017 0833   HDL 36 (L) 10/08/2017 0833   CHOLHDL 3.6 10/08/2017 0833   CHOLHDL 3.5 06/07/2015 1005   VLDL 33 (H) 06/07/2015 1005   LDLCALC 73 10/08/2017 0833    CBC    Component Value Date/Time   WBC 7.4 12/03/2015 1704   RBC 4.17 (  L) 12/03/2015 1704   HGB 12.6 (L) 12/03/2015 1704   HCT 38.9 12/03/2015 1704   PLT 288 12/03/2015 1704   MCV 93.3 12/03/2015 1704   MCH 30.2 12/03/2015 1704   MCHC 32.4 12/03/2015 1704   RDW 13.1 12/03/2015 1704   LYMPHSABS 2,294 12/03/2015 1704   MONOABS 518 12/03/2015 1704   EOSABS 148 12/03/2015 1704   BASOSABS 74 12/03/2015 1704    Lab Results  Component Value Date   HGBA1C 8.5 (A) 01/07/2018    Assessment & Plan:   1. Type 2 diabetes mellitus with diabetic polyneuropathy, with long-term current use of insulin (HCC) Not fully at goal with A1c of 8.5 We will check A1c and adjust regimen accordingly Counseled on Diabetic diet, my plate method, 948 minutes of moderate intensity exercise/week Keep blood sugar logs with fasting goals of 80-120 mg/dl, random of less than 180 and in the event of sugars less than 60 mg/dl or greater than 400 mg/dl please notify the clinic ASAP. It is recommended that you undergo annual eye exams and annual foot exams. Pneumonia vaccine is recommended. - POCT glucose (manual entry) - Hemoglobin A1c - CMP14+EGFR - Lipid panel - Microalbumin/Creatinine Ratio, Urine - gabapentin (NEURONTIN) 300 MG capsule; Take 1 capsule (300 mg total) by mouth 3 (three) times daily.  Dispense: 90 capsule; Refill: 6  2. Hyperlipidemia, unspecified hyperlipidemia type Controlled Low-cholesterol diet  3. Essential hypertension Controlled Counseled on blood  pressure goal of less than 130/80, low-sodium, DASH diet, medication compliance, 150 minutes of moderate intensity exercise per week. Discussed medication compliance, adverse effects.   4. Obesity, morbid, BMI 50 or higher ([place]) Commended on 11 pound weight loss since last visit Continue working on reducing portion sizes, increasing physical activity   Meds ordered this encounter  Medications  . gabapentin (NEURONTIN) 300 MG capsule    Sig: Take 1 capsule (300 mg total) by mouth 3 (three) times daily.    Dispense:  90 capsule    Refill:  6    Follow-up: Return in about 3 months (around 07/12/2018) for Follow-up of chronic medical conditions.       Charlott Rakes, MD, FAAFP. [facility] [place], [place]   04/13/2018, 9:30 AM

## 2018-04-13 NOTE — Patient Instructions (Signed)
Diabetes Mellitus and Nutrition, Adult  When you have diabetes (diabetes mellitus), it is very important to have healthy eating habits because your blood sugar (glucose) levels are greatly affected by what you eat and drink. Eating healthy foods in the appropriate amounts, at about the same times every day, can help you:  · Control your blood glucose.  · Lower your risk of heart disease.  · Improve your blood pressure.  · Reach or maintain a healthy weight.  Every person with diabetes is different, and each person has different needs for a meal plan. Your health care provider may recommend that you work with a diet and nutrition specialist (dietitian) to make a meal plan that is best for you. Your meal plan may vary depending on factors such as:  · The calories you need.  · The medicines you take.  · Your weight.  · Your blood glucose, blood pressure, and cholesterol levels.  · Your activity level.  · Other health conditions you have, such as heart or kidney disease.  How do carbohydrates affect me?  Carbohydrates, also called carbs, affect your blood glucose level more than any other type of food. Eating carbs naturally raises the amount of glucose in your blood. Carb counting is a method for keeping track of how many carbs you eat. Counting carbs is important to keep your blood glucose at a healthy level, especially if you use insulin or take certain oral diabetes medicines.  It is important to know how many carbs you can safely have in each meal. This is different for every person. Your dietitian can help you calculate how many carbs you should have at each meal and for each snack.  Foods that contain carbs include:  · Bread, cereal, rice, pasta, and crackers.  · Potatoes and corn.  · Peas, beans, and lentils.  · Milk and yogurt.  · Fruit and juice.  · Desserts, such as cakes, cookies, ice cream, and candy.  How does alcohol affect me?  Alcohol can cause a sudden decrease in blood glucose (hypoglycemia),  especially if you use insulin or take certain oral diabetes medicines. Hypoglycemia can be a life-threatening condition. Symptoms of hypoglycemia (sleepiness, dizziness, and confusion) are similar to symptoms of having too much alcohol.  If your health care provider says that alcohol is safe for you, follow these guidelines:  · Limit alcohol intake to no more than 1 drink per day for nonpregnant women and 2 drinks per day for men. One drink equals 12 oz of beer, 5 oz of wine, or 1½ oz of hard liquor.  · Do not drink on an empty stomach.  · Keep yourself hydrated with water, diet soda, or unsweetened iced tea.  · Keep in mind that regular soda, juice, and other mixers may contain a lot of sugar and must be counted as carbs.  What are tips for following this plan?    Reading food labels  · Start by checking the serving size on the "Nutrition Facts" label of packaged foods and drinks. The amount of calories, carbs, fats, and other nutrients listed on the label is based on one serving of the item. Many items contain more than one serving per package.  · Check the total grams (g) of carbs in one serving. You can calculate the number of servings of carbs in one serving by dividing the total carbs by 15. For example, if a food has 30 g of total carbs, it would be equal to 2   servings of carbs.  · Check the number of grams (g) of saturated and trans fats in one serving. Choose foods that have low or no amount of these fats.  · Check the number of milligrams (mg) of salt (sodium) in one serving. Most people should limit total sodium intake to less than 2,300 mg per day.  · Always check the nutrition information of foods labeled as "low-fat" or "nonfat". These foods may be higher in added sugar or refined carbs and should be avoided.  · Talk to your dietitian to identify your daily goals for nutrients listed on the label.  Shopping  · Avoid buying canned, premade, or processed foods. These foods tend to be high in fat, sodium,  and added sugar.  · Shop around the outside edge of the grocery store. This includes fresh fruits and vegetables, bulk grains, fresh meats, and fresh dairy.  Cooking  · Use low-heat cooking methods, such as baking, instead of high-heat cooking methods like deep frying.  · Cook using healthy oils, such as olive, canola, or sunflower oil.  · Avoid cooking with butter, cream, or high-fat meats.  Meal planning  · Eat meals and snacks regularly, preferably at the same times every day. Avoid going long periods of time without eating.  · Eat foods high in fiber, such as fresh fruits, vegetables, beans, and whole grains. Talk to your dietitian about how many servings of carbs you can eat at each meal.  · Eat 4-6 ounces (oz) of lean protein each day, such as lean meat, chicken, fish, eggs, or tofu. One oz of lean protein is equal to:  ? 1 oz of meat, chicken, or fish.  ? 1 egg.  ? ¼ cup of tofu.  · Eat some foods each day that contain healthy fats, such as avocado, nuts, seeds, and fish.  Lifestyle  · Check your blood glucose regularly.  · Exercise regularly as told by your health care provider. This may include:  ? 150 minutes of moderate-intensity or vigorous-intensity exercise each week. This could be brisk walking, biking, or water aerobics.  ? Stretching and doing strength exercises, such as yoga or weightlifting, at least 2 times a week.  · Take medicines as told by your health care provider.  · Do not use any products that contain nicotine or tobacco, such as cigarettes and e-cigarettes. If you need help quitting, ask your health care provider.  · Work with a counselor or diabetes educator to identify strategies to manage stress and any emotional and social challenges.  Questions to ask a health care provider  · Do I need to meet with a diabetes educator?  · Do I need to meet with a dietitian?  · What number can I call if I have questions?  · When are the best times to check my blood glucose?  Where to find more  information:  · American Diabetes Association: diabetes.org  · Academy of Nutrition and Dietetics: www.eatright.org  · National Institute of Diabetes and Digestive and Kidney Diseases (NIH): www.niddk.nih.gov  Summary  · A healthy meal plan will help you control your blood glucose and maintain a healthy lifestyle.  · Working with a diet and nutrition specialist (dietitian) can help you make a meal plan that is best for you.  · Keep in mind that carbohydrates (carbs) and alcohol have immediate effects on your blood glucose levels. It is important to count carbs and to use alcohol carefully.  This information is not intended to   replace advice given to you by your health care provider. Make sure you discuss any questions you have with your health care provider.  Document Released: 11/14/2004 Document Revised: 09/17/2016 Document Reviewed: 03/24/2016  Elsevier Interactive Patient Education © 2019 Elsevier Inc.

## 2018-04-14 LAB — LIPID PANEL
Chol/HDL Ratio: 3.5 ratio (ref 0.0–5.0)
Cholesterol, Total: 137 mg/dL (ref 100–199)
HDL: 39 mg/dL — AB (ref >39)
LDL Calculated: 77 mg/dL (ref 0–99)
TRIGLYCERIDES: 104 mg/dL (ref 0–149)
VLDL Cholesterol Cal: 21 mg/dL (ref 5–40)

## 2018-04-14 LAB — CMP14+EGFR
ALBUMIN: 3.9 g/dL (ref 3.8–4.9)
ALK PHOS: 81 IU/L (ref 39–117)
ALT: 21 IU/L (ref 0–44)
AST: 25 IU/L (ref 0–40)
Albumin/Globulin Ratio: 1.4 (ref 1.2–2.2)
BILIRUBIN TOTAL: 0.3 mg/dL (ref 0.0–1.2)
BUN/Creatinine Ratio: 19 (ref 9–20)
BUN: 23 mg/dL (ref 6–24)
CHLORIDE: 111 mmol/L — AB (ref 96–106)
CO2: 19 mmol/L — ABNORMAL LOW (ref 20–29)
Calcium: 9.3 mg/dL (ref 8.7–10.2)
Creatinine, Ser: 1.18 mg/dL (ref 0.76–1.27)
GFR calc Af Amer: 80 mL/min/{1.73_m2} (ref >59)
GFR calc non Af Amer: 69 mL/min/{1.73_m2} (ref >59)
GLUCOSE: 123 mg/dL — AB (ref 65–99)
Globulin, Total: 2.7 g/dL (ref 1.5–4.5)
Potassium: 5.6 mmol/L — ABNORMAL HIGH (ref 3.5–5.2)
SODIUM: 144 mmol/L (ref 134–144)
Total Protein: 6.6 g/dL (ref 6.0–8.5)

## 2018-04-14 LAB — HEMOGLOBIN A1C
Est. average glucose Bld gHb Est-mCnc: 206 mg/dL
HEMOGLOBIN A1C: 8.8 % — AB (ref 4.8–5.6)

## 2018-04-14 MED ORDER — LISINOPRIL 10 MG PO TABS: 5.0000 mg | ORAL_TABLET | Freq: Every day | ORAL | 6 refills | Status: DC

## 2018-04-14 MED ORDER — INSULIN DETEMIR 100 UNIT/ML ~~LOC~~ SOLN: 25.0000 [IU] | Freq: Every day | SUBCUTANEOUS | 6 refills | Status: DC

## 2018-04-14 MED ORDER — HYDROCHLOROTHIAZIDE 25 MG PO TABS: 25.0000 mg | ORAL_TABLET | Freq: Every day | ORAL | 6 refills | Status: DC

## 2018-04-14 MED FILL — LISINOPRIL 10 MG TABS: 10 | 30 days supply | Qty: 15 | Fill #0

## 2018-04-14 MED FILL — HYDROCHLOROTHIAZIDE 25 MG T: 25 | 30 days supply | Qty: 30 | Fill #0

## 2018-04-15 ENCOUNTER — Telehealth: Payer: Self-pay

## 2018-04-15 ENCOUNTER — Ambulatory Visit: Payer: Self-pay | Admitting: Family Medicine

## 2018-04-15 MED FILL — MELOXICAM 15 MG TABLET: 15 | 30 days supply | Qty: 30 | Fill #10

## 2018-04-15 NOTE — Telephone Encounter (Signed)
-----   Message from Hoy Register, MD sent at 04/14/2018  8:22 AM EST ----- A1c is elevated at 8.8, please advise to increase Levemir to 25 units.  He also has hyperkalemia and I have discontinued lisinopril/HCTZ and prescribed 2 separate pills-lisinopril 10 mg, HCTZ 25 mg as the high dose of lisinopril 20 mg and the combo pill could be causing his hyperkalemia.  Cholesterol is normal

## 2018-04-15 NOTE — Telephone Encounter (Signed)
Patient was called and informed of lab results and medication change. Patient states that he will pick up new medication on Monday.

## 2018-04-22 MED FILL — metFORMIN HCL 1000 MG TABS: 1000 | 30 days supply | Qty: 60 | Fill #3

## 2018-04-22 MED FILL — TRUEPLUS SYR 0.5ML 30GX5/16: 30G X 5/16" | 25 days supply | Qty: 100 | Fill #1

## 2018-05-06 MED FILL — OMEGA-3 ETHYL ESTERS 1 GM C: 1 | 30 days supply | Qty: 60 | Fill #2

## 2018-05-06 MED FILL — ATORVASTATIN CALCIUM 40 MG: 40 | 30 days supply | Qty: 30 | Fill #1

## 2018-05-06 MED FILL — glipiZIDE 10 MG TABS: 10 | 30 days supply | Qty: 60 | Fill #4

## 2018-05-10 MED FILL — HYDROCHLOROTHIAZIDE 25 MG T: 25 | 30 days supply | Qty: 30 | Fill #1

## 2018-05-10 MED FILL — LISINOPRIL 10 MG TABS: 10 | 30 days supply | Qty: 15 | Fill #1

## 2018-05-10 MED FILL — MELOXICAM 15 MG TABLET: 15 | 30 days supply | Qty: 30 | Fill #11

## 2018-05-19 MED FILL — TRUEPLUS PEN NDL 32GX5/32": 32G X 4 MM | 30 days supply | Qty: 100 | Fill #1

## 2018-05-19 MED FILL — TRUEPLUS PEN NDL 32GX5/32: 32G X 4 MM | 30 days supply | Qty: 100 | Fill #1

## 2018-05-20 MED FILL — metFORMIN HCL 1000 MG TABS: 1000 | 30 days supply | Qty: 60 | Fill #4

## 2018-06-04 ENCOUNTER — Other Ambulatory Visit: Payer: Self-pay | Admitting: Internal Medicine

## 2018-06-04 DIAGNOSIS — E785 Hyperlipidemia, unspecified: Principal | ICD-10-CM

## 2018-06-04 DIAGNOSIS — E1169 Type 2 diabetes mellitus with other specified complication: Secondary | ICD-10-CM

## 2018-06-04 MED FILL — ATORVASTATIN CALCIUM 40 MG: 40 | 30 days supply | Qty: 30 | Fill #2

## 2018-06-04 MED FILL — LISINOPRIL 10 MG TABS: 10 | 30 days supply | Qty: 15 | Fill #2

## 2018-06-04 MED FILL — glipiZIDE 10 MG TABS: 10 | 30 days supply | Qty: 60 | Fill #5

## 2018-06-05 MED FILL — OMEGA-3 ETHYL ESTERS 1 GM C: 1 | 30 days supply | Qty: 60 | Fill #0

## 2018-06-09 MED FILL — HYDROCHLOROTHIAZIDE 25 MG T: 25 | 30 days supply | Qty: 30 | Fill #2

## 2018-06-15 MED FILL — MELOXICAM 15 MG TABLET: 15 | 30 days supply | Qty: 30 | Fill #0

## 2018-06-18 MED FILL — ?METFORMIN HCL 1000 MG TAB: 1000 | 30 days supply | Qty: 60 | Fill #5

## 2018-06-18 MED FILL — GABAPENTIN 300 MG CAPSULE: 300 | 30 days supply | Qty: 90 | Fill #1

## 2018-06-24 MED FILL — $VICTOZA 2-PAK 18MG/3ML PEN: 18 | 30 days supply | Qty: 9 | Fill #0

## 2018-06-24 MED FILL — $LEVEMIR 100U/ML VIAL: 100 | 80 days supply | Qty: 20 | Fill #0

## 2018-07-02 MED FILL — glipiZIDE 10 MG TABS: 10 | 30 days supply | Qty: 60 | Fill #6

## 2018-07-02 MED FILL — OMEGA-3 ETHYL ESTERS 1 GM C: 1 | 30 days supply | Qty: 60 | Fill #1

## 2018-07-02 MED FILL — LISINOPRIL 10 MG TABS: 10 | 30 days supply | Qty: 15 | Fill #3

## 2018-07-02 MED FILL — TRUEPLUS SYR 0.5ML 30GX5/16: 30G X 5/16" | 25 days supply | Qty: 100 | Fill #2

## 2018-07-02 MED FILL — ATORVASTATIN CALCIUM 40 MG: 40 | 30 days supply | Qty: 30 | Fill #3

## 2018-07-08 MED FILL — HYDROCHLOROTHIAZIDE 25 MG T: 25 | 30 days supply | Qty: 30 | Fill #3

## 2018-07-08 MED FILL — MELOXICAM 15 MG TABLET: 15 | 30 days supply | Qty: 30 | Fill #1

## 2018-07-15 ENCOUNTER — Other Ambulatory Visit: Payer: Self-pay

## 2018-07-15 ENCOUNTER — Encounter: Payer: Self-pay | Admitting: Family Medicine

## 2018-07-15 ENCOUNTER — Ambulatory Visit: Payer: Self-pay | Attending: Family Medicine | Admitting: Family Medicine

## 2018-07-15 DIAGNOSIS — E1142 Type 2 diabetes mellitus with diabetic polyneuropathy: Secondary | ICD-10-CM

## 2018-07-15 DIAGNOSIS — Z794 Long term (current) use of insulin: Secondary | ICD-10-CM

## 2018-07-15 DIAGNOSIS — M6289 Other specified disorders of muscle: Secondary | ICD-10-CM

## 2018-07-15 DIAGNOSIS — I10 Essential (primary) hypertension: Secondary | ICD-10-CM

## 2018-07-15 NOTE — Progress Notes (Signed)
Virtual Visit via Telephone Note  I connected with Dillon Mcgee, on 07/15/2018 at 8:40 AM by telephone due to the COVID-19 pandemic and verified that I am speaking with the correct person using two identifiers.   Consent: I discussed the limitations, risks, security and privacy concerns of performing an evaluation and management service by telephone and the availability of in person appointments. I also discussed with the patient that there may be a patient responsible charge related to this service. The patient expressed understanding and agreed to proceed.   Location of Patient: Home  Location of Provider: Clinic   Persons participating in Telemedicine visit: Jadiel Sinkfield  Doloris Hall - CMA Dr Margarita Rana - PCP     History of Present Illness: Dillon Mcgee  is a 58 year old male with a history of hypertension, type 2 diabetes mellitus (A1c 8.8 in 04/2018), hyperlipidemia, obesity here for follow-up visit.  He reports his fasting sugars have ranged between 120 and 125 with his highest sugar 230 and lowest sugar at 65.  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  He complains about tightness in his thighs especially when he has been on his feet for a long time and this occurs almost daily and is improved by stretching and by use of Robaxin. He denies chest pains, dyspnea, shortness of breath. He has no means of checking his blood pressures.   Past Medical History:  Diagnosis Date  . Diabetes mellitus without complication ([place])   . Hyperlipidemia   . Hypertension    Allergies  Allergen Reactions  . Pioglitazone Swelling    Lower extremity swelling.    Current Outpatient Medications on File Prior to Visit  Medication Sig Dispense Refill  . aspirin EC 81 MG tablet Take 1 tablet (81 mg total) daily by mouth. 90 tablet 3  . atorvastatin (LIPITOR) 40 MG tablet  Take 1 tablet (40 mg total) by mouth daily. 30 tablet 6  . Blood Glucose Monitoring Suppl (TRUE METRIX GO GLUCOSE METER) w/Device KIT 1 each by Does not apply route every 8 (eight) hours as needed. 1 kit 0  . ferrous sulfate 325 (65 FE) MG tablet Take 325 mg by mouth daily with breakfast.    . gabapentin (NEURONTIN) 300 MG capsule Take 1 capsule (300 mg total) by mouth 3 (three) times daily. 90 capsule 6  . glipiZIDE (GLUCOTROL) 10 MG tablet TAKE 1 TABLET BY MOUTH 2 TIMES DAILY BEFORE A MEAL. 60 tablet 6  . glucose blood test strip Use as instructed 100 each 1  . hydrochlorothiazide (HYDRODIURIL) 25 MG tablet Take 1 tablet (25 mg total) by mouth daily. 30 tablet 6  . insulin detemir (LEVEMIR) 100 UNIT/ML injection Inject 0.25 mLs (25 Units total) into the skin at bedtime. 30 mL 6  . Insulin Pen Needle (TRUEPLUS PEN NEEDLES) 32G X 4 MM MISC Use as directed once daily 100 each 3  . Insulin Syringe-Needle U-100 (TRUEPLUS INSULIN SYRINGE) 30G X 5/16" 1 ML MISC Use as directed 4 times daily 300 each 3  . liraglutide (VICTOZA) 18 MG/3ML SOPN Inject 0.3 mLs (1.8 mg total) into the skin daily. 30 mL 6  . lisinopril (PRINIVIL,ZESTRIL) 10 MG tablet Take 0.5 tablets (5 mg total) by mouth daily. 30 tablet 6  . meloxicam (MOBIC) 15 MG tablet Take 1 tablet (15 mg total) by mouth daily. Take with  food 90 tablet 3  . metFORMIN (GLUCOPHAGE) 1000 MG tablet Take 1 tablet (1,000 mg total) by mouth 2 (two) times daily with a meal. 60 tablet 6  . omega-3 acid ethyl esters (LOVAZA) 1 g capsule TAKE 1 CAPSULE BY MOUTH 2 TIMES DAILY. 60 capsule 2  . TRUEPLUS LANCETS 26G MISC 1 each by Does not apply route every 8 (eight) hours as needed. 100 each 12  . acetaminophen (TYLENOL) 500 MG tablet Take 2 tablets (1,000 mg total) by mouth every 6 (six) hours as needed for moderate pain. (Patient not taking: Reported on 07/15/2018) 30 tablet 0  . methocarbamol (ROBAXIN) 500 MG tablet Take 1 tablet (500 mg total) by mouth every 8  (eight) hours as needed for muscle spasms. (Patient not taking: Reported on 07/15/2018) 90 tablet 1  . triamcinolone cream (KENALOG) 0.1 % Apply 1 application topically 2 (two) times daily. (Patient not taking: Reported on 10/01/2017) 30 g 2   No current facility-administered medications on file prior to visit.     Observations/Objective: Awake, alert, oriented x3 No acute distress   CMP Latest Ref Rng & Units 04/13/2018 10/08/2017 08/14/2016  Glucose 65 - 99 mg/dL 123(H) 177(H) 113(H)  BUN 6 - 24 mg/dL 23 26(H) 18  Creatinine 0.76 - 1.27 mg/dL 1.18 1.33(H) 1.04  Sodium 134 - 144 mmol/L 144 144 146(H)  Potassium 3.5 - 5.2 mmol/L 5.6(H) 5.2 5.4(H)  Chloride 96 - 106 mmol/L 111(H) 108(H) 106  CO2 20 - 29 mmol/L 19(L) 23 26  Calcium 8.7 - 10.2 mg/dL 9.3 8.8 9.4  Total Protein 6.0 - 8.5 g/dL 6.6 6.2 6.2  Total Bilirubin 0.0 - 1.2 mg/dL 0.3 0.4 <0.2  Alkaline Phos 39 - 117 IU/L 81 79 70  AST 0 - 40 IU/L 25 32 19  ALT 0 - 44 IU/L 21 37 16    Lipid Panel     Component Value Date/Time   CHOL 137 04/13/2018 1017   TRIG 104 04/13/2018 1017   HDL 39 (L) 04/13/2018 1017   CHOLHDL 3.5 04/13/2018 1017   CHOLHDL 3.5 06/07/2015 1005   VLDL 33 (H) 06/07/2015 1005   LDLCALC 77 04/13/2018 1017    Lab Results  Component Value Date   HGBA1C 8.8 (H) 04/13/2018    Assessment and Plan: 1. Type 2 diabetes mellitus with diabetic polyneuropathy, with long-term current use of insulin (HCC) Uncontrolled with A1c of 8.8 Fasting sugars reveal improvement No regimen change at this time and will order A1c at next visit Diabetic diet, lifestyle modifications  2. Muscle stiffness Hold Lipitor x1 week to assess for improvement ; call the clinic with this information Meanwhile continue Robaxin  3. Essential hypertension Continue current antihypertensive We will reassess blood pressure at next in person office visit   Follow Up Instructions: Return in about 3 months (around 10/15/2018).    I  discussed the assessment and treatment plan with the patient. The patient was provided an opportunity to ask questions and all were answered. The patient agreed with the plan and demonstrated an understanding of the instructions.   The patient was advised to call back or seek an in-person evaluation if the symptoms worsen or if the condition fails to improve as anticipated.     I provided 15 minutes total of non-face-to-face time during this encounter including median intraservice time, reviewing previous notes, labs, imaging, medications and explaining diagnosis and management.     Charlott Rakes, MD, FAAFP. [place] [facility] [place],  [place] [phone]   07/15/2018, 8:40 AM

## 2018-07-15 NOTE — Progress Notes (Signed)
Patient has been called and DOB has been verified. Patient has been screened and transferred to PCP to start phone visit.     

## 2018-07-19 MED FILL — $VICTOZA 2-PAK 18MG/3ML PEN: 18 | 30 days supply | Qty: 9 | Fill #1

## 2018-07-22 ENCOUNTER — Telehealth: Payer: Self-pay

## 2018-07-22 MED FILL — metFORMIN HCL 1000 MG TABS: 1000 | 30 days supply | Qty: 60 | Fill #6

## 2018-07-22 NOTE — Telephone Encounter (Signed)
Pt contacted the office and stated he had a televisit with pcp on 07/15/18. Pt states he discussed with pcp legs.   Pt states since he has been off liptor he is doing much better and he is able to stretch his legs.

## 2018-07-28 NOTE — Telephone Encounter (Signed)
Contacted pt and went over Dr. Laural Benes response pt is aware and doesn't have any questions or concerns  Pt will give [place] a call if his symptoms returns

## 2018-07-29 ENCOUNTER — Telehealth: Payer: Self-pay | Admitting: Family Medicine

## 2018-07-29 MED FILL — LISINOPRIL 10 MG TABS: 10 | 30 days supply | Qty: 15 | Fill #4

## 2018-07-29 MED FILL — GABAPENTIN 300 MG CAPSULE: 300 | 30 days supply | Qty: 90 | Fill #2

## 2018-07-29 MED FILL — TRUE METRIX TEST STRIP: 25 days supply | Qty: 100 | Fill #1

## 2018-07-29 NOTE — Telephone Encounter (Signed)
Patient returned call

## 2018-07-29 NOTE — Telephone Encounter (Signed)
Call returned to the patient. Informed him that this CM had not called him.  He said that his messages may have gotten crossed. He said that the last instructions he received from this office were to take 1/2 tablet of lipitor every other day. Informed him that this CM does not see any additional instructions for him.

## 2018-08-05 ENCOUNTER — Other Ambulatory Visit: Payer: Self-pay | Admitting: Family Medicine

## 2018-08-05 DIAGNOSIS — Z794 Long term (current) use of insulin: Secondary | ICD-10-CM

## 2018-08-05 DIAGNOSIS — E1142 Type 2 diabetes mellitus with diabetic polyneuropathy: Secondary | ICD-10-CM

## 2018-08-05 MED FILL — OMEGA-3 ETHYL ESTERS 1 GM C: 1 | 30 days supply | Qty: 60 | Fill #2

## 2018-08-05 MED FILL — glipiZIDE 10 MG TABS: 10 | 30 days supply | Qty: 60 | Fill #0

## 2018-08-13 MED FILL — ?HYDROCHLOROTHIAZIDE 25MG T: 25 | 30 days supply | Qty: 30 | Fill #4

## 2018-08-13 MED FILL — MELOXICAM 15 MG TABLET: 15 | 30 days supply | Qty: 30 | Fill #2

## 2018-08-18 MED FILL — metFORMIN HCL 1000 MG TABS: 1000 | 30 days supply | Qty: 60 | Fill #0

## 2018-08-18 MED FILL — !VICTOZA 18MG/3ML INJECT: 18 | 30 days supply | Qty: 9 | Fill #2

## 2018-08-30 MED FILL — GABAPENTIN 300 MG CAPSULE: 300 | 30 days supply | Qty: 90 | Fill #3

## 2018-08-30 MED FILL — LISINOPRIL 10 MG TABS: 10 | 30 days supply | Qty: 15 | Fill #5

## 2018-09-04 IMAGING — DX DG CHEST 2V
2 series · 2 of 2 positions shown · non-contrast
Comparison: 07/02/2008

CLINICAL DATA: Pitting edema

EXAM:
CHEST  2 VIEW

[chest pa]
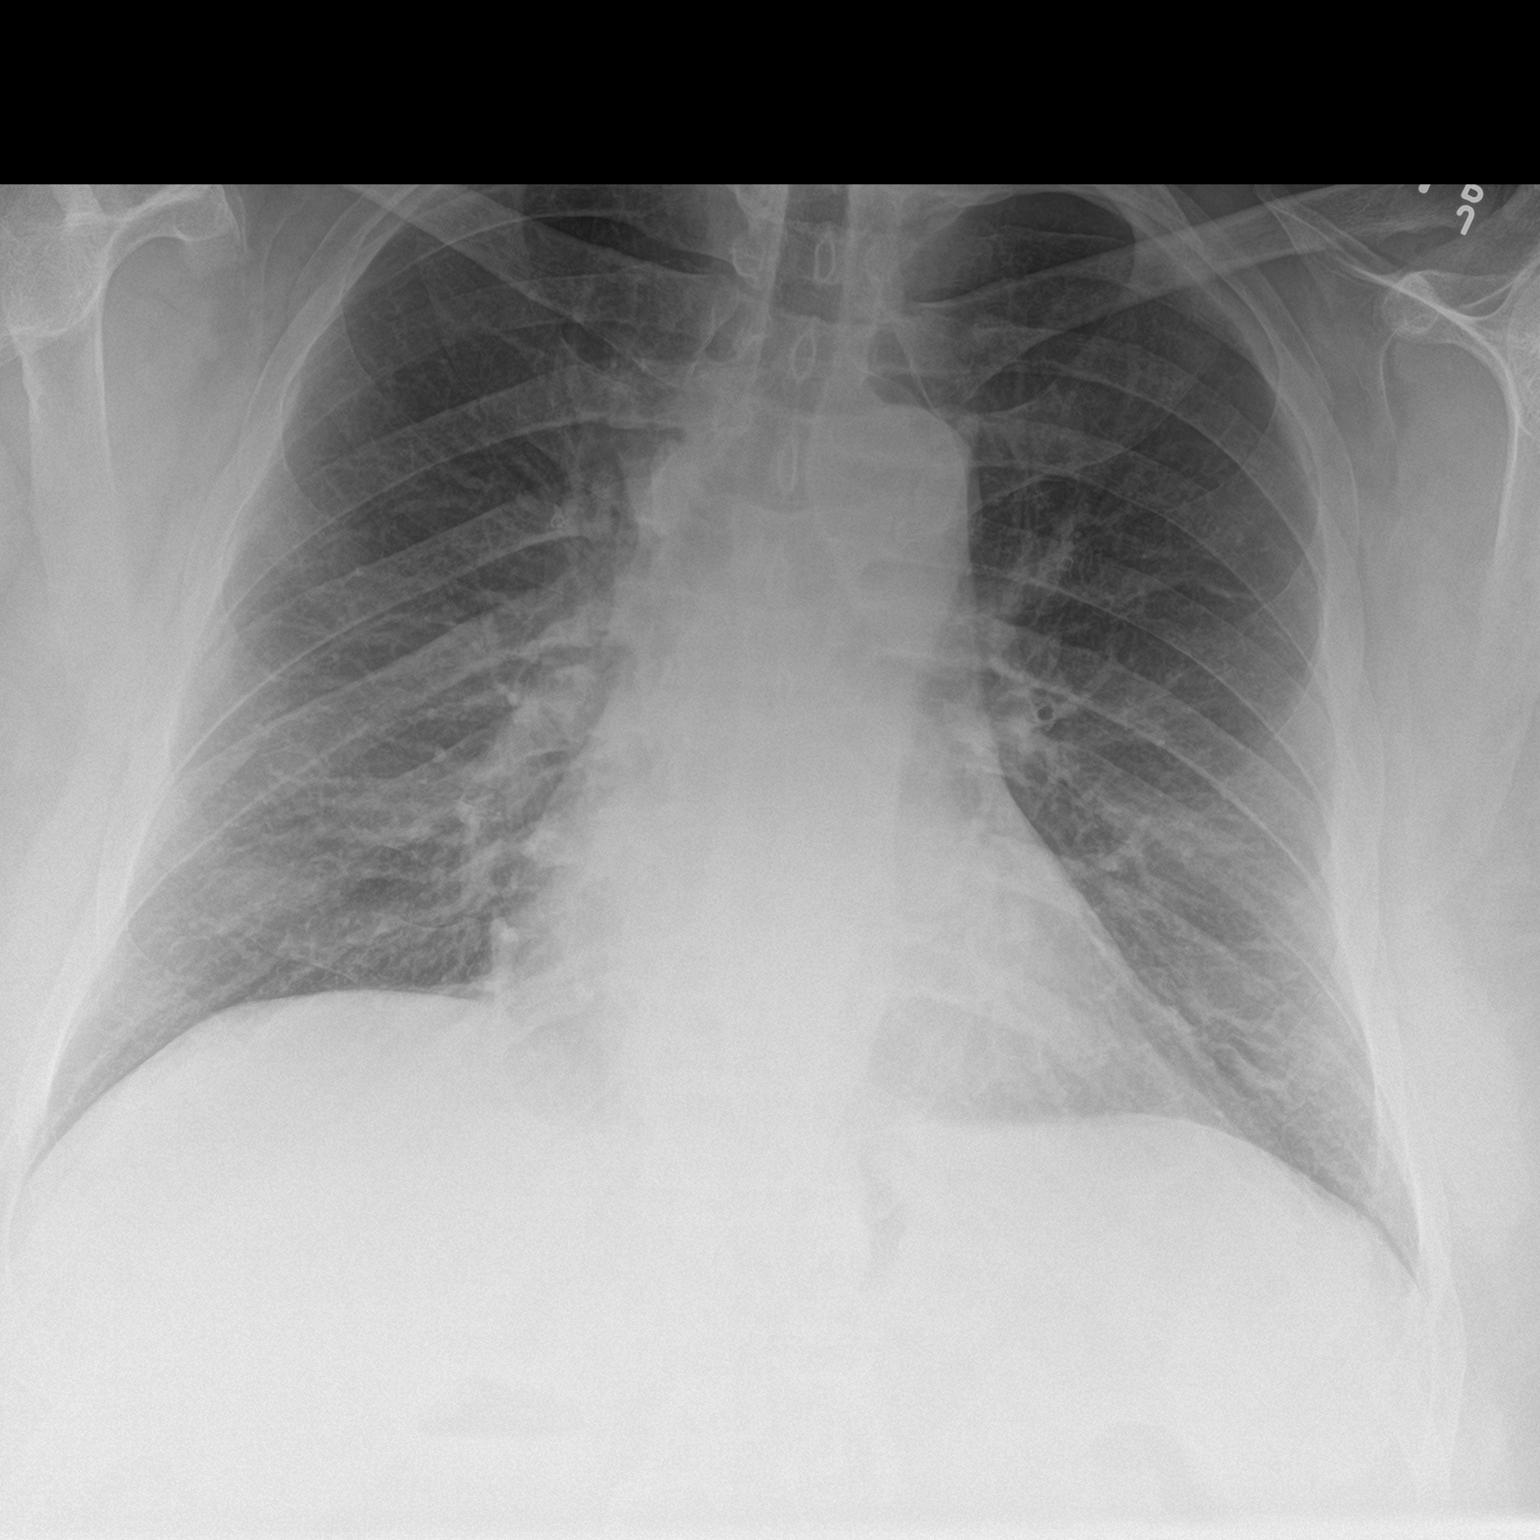

[chest lat]
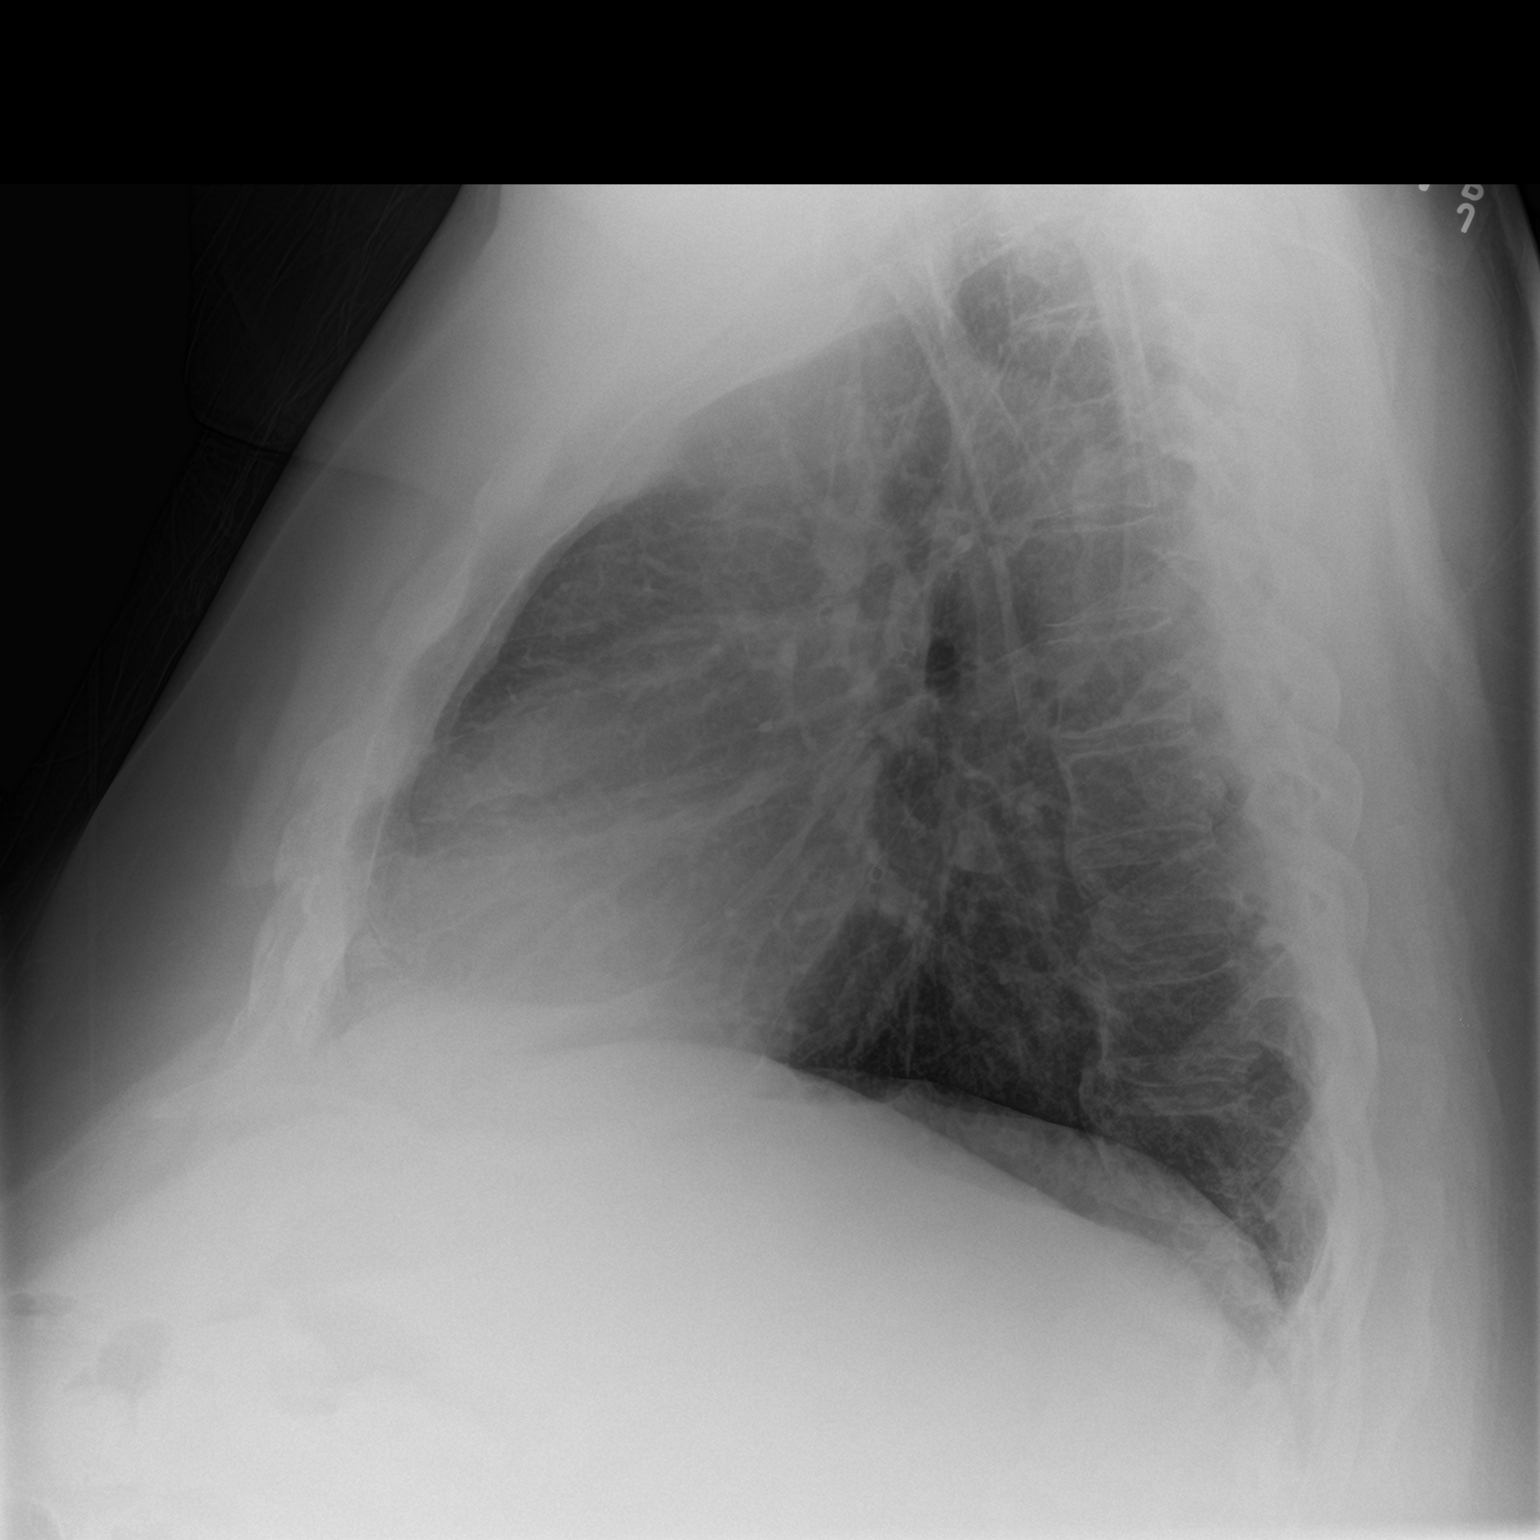

[2 of 2 positions shown; findings below may reference images not displayed]

FINDINGS: Heart size within normal limits. Negative for heart failure. Lungs
are clear without infiltrate edema or mass.
IMPRESSION: No active cardiopulmonary disease.

## 2018-09-06 ENCOUNTER — Other Ambulatory Visit: Payer: Self-pay | Admitting: Family Medicine

## 2018-09-06 DIAGNOSIS — E785 Hyperlipidemia, unspecified: Secondary | ICD-10-CM

## 2018-09-06 DIAGNOSIS — E1169 Type 2 diabetes mellitus with other specified complication: Secondary | ICD-10-CM

## 2018-09-06 MED FILL — glipiZIDE 10 MG TABS: 10 | 30 days supply | Qty: 60 | Fill #1

## 2018-09-07 MED FILL — OMEGA-3 ETHYL ESTERS 1 GM C: 1 | 30 days supply | Qty: 60 | Fill #0

## 2018-09-10 MED FILL — ?HYDROCHLOROTHIAZIDE 25MG T: 25 | 30 days supply | Qty: 30 | Fill #5

## 2018-09-10 MED FILL — MELOXICAM 15 MG TABLET: 15 | 30 days supply | Qty: 30 | Fill #3

## 2018-09-24 MED FILL — metFORMIN HCL 1000 MG TABS: 1000 | 30 days supply | Qty: 60 | Fill #1

## 2018-09-27 MED FILL — $LEVEMIR 100U/ML VIAL: 100 | 80 days supply | Qty: 20 | Fill #1

## 2018-09-27 MED FILL — $VICTOZA 2-PAK 18MG/3ML PEN: 18 | 30 days supply | Qty: 9 | Fill #3

## 2018-10-07 MED FILL — OMEGA-3 ETHYL ESTERS 1 GM C: 1 | 30 days supply | Qty: 60 | Fill #1

## 2018-10-07 MED FILL — TRUEPLUS PEN NDL 32GX5/32": 32G X 4 MM | 30 days supply | Qty: 100 | Fill #2

## 2018-10-07 MED FILL — TRUEPLUS PEN NDL 32GX5/32: 32G X 4 MM | 30 days supply | Qty: 100 | Fill #2

## 2018-10-07 MED FILL — LISINOPRIL 10 MG TABS: 10 | 30 days supply | Qty: 15 | Fill #6

## 2018-10-07 MED FILL — glipiZIDE 10 MG TABS: 10 | 30 days supply | Qty: 60 | Fill #2

## 2018-10-18 ENCOUNTER — Other Ambulatory Visit: Payer: Self-pay

## 2018-10-18 ENCOUNTER — Other Ambulatory Visit: Payer: Self-pay | Admitting: Family Medicine

## 2018-10-18 ENCOUNTER — Ambulatory Visit: Payer: Self-pay | Attending: Family Medicine | Admitting: Family Medicine

## 2018-10-18 ENCOUNTER — Encounter: Payer: Self-pay | Admitting: Family Medicine

## 2018-10-18 VITALS — BP 127/76 | HR 62 | Temp 98.5°F | Ht 76.0 in | Wt 362.0 lb

## 2018-10-18 DIAGNOSIS — E1142 Type 2 diabetes mellitus with diabetic polyneuropathy: Secondary | ICD-10-CM

## 2018-10-18 DIAGNOSIS — Z794 Long term (current) use of insulin: Secondary | ICD-10-CM

## 2018-10-18 DIAGNOSIS — I10 Essential (primary) hypertension: Secondary | ICD-10-CM

## 2018-10-18 DIAGNOSIS — M79652 Pain in left thigh: Secondary | ICD-10-CM

## 2018-10-18 DIAGNOSIS — M79651 Pain in right thigh: Secondary | ICD-10-CM

## 2018-10-18 DIAGNOSIS — E785 Hyperlipidemia, unspecified: Secondary | ICD-10-CM

## 2018-10-18 LAB — POCT GLYCOSYLATED HEMOGLOBIN (HGB A1C): HbA1c, POC (controlled diabetic range): 8.6 % — AB (ref 0.0–7.0)

## 2018-10-18 LAB — GLUCOSE, POCT (MANUAL RESULT ENTRY): POC Glucose: 123 mg/dl — AB (ref 70–99)

## 2018-10-18 MED ORDER — LISINOPRIL 10 MG PO TABS: 5.0000 mg | ORAL_TABLET | Freq: Every day | ORAL | 6 refills | Status: DC

## 2018-10-18 MED ORDER — HYDROCHLOROTHIAZIDE 25 MG PO TABS: 25.0000 mg | ORAL_TABLET | Freq: Every day | ORAL | 6 refills | Status: DC

## 2018-10-18 MED ORDER — GLIPIZIDE 10 MG PO TABS: 10.0000 mg | ORAL_TABLET | Freq: Two times a day (BID) | ORAL | 6 refills | Status: DC

## 2018-10-18 MED ORDER — VICTOZA 18 MG/3ML ~~LOC~~ SOPN: 1.8000 mg | PEN_INJECTOR | Freq: Every day | SUBCUTANEOUS | 6 refills | Status: DC

## 2018-10-18 MED ORDER — METHOCARBAMOL 500 MG PO TABS: 500.0000 mg | ORAL_TABLET | Freq: Three times a day (TID) | ORAL | 1 refills | Status: DC | PRN

## 2018-10-18 MED ORDER — INSULIN DETEMIR 100 UNIT/ML ~~LOC~~ SOLN: 30.0000 [IU] | Freq: Every day | SUBCUTANEOUS | 6 refills | Status: DC

## 2018-10-18 MED ORDER — ATORVASTATIN CALCIUM 40 MG PO TABS: 40.0000 mg | ORAL_TABLET | Freq: Every day | ORAL | 6 refills | Status: DC

## 2018-10-18 MED ORDER — GABAPENTIN 300 MG PO CAPS: 300.0000 mg | ORAL_CAPSULE | Freq: Three times a day (TID) | ORAL | 6 refills | Status: DC

## 2018-10-18 MED ORDER — METFORMIN HCL 1000 MG PO TABS: 1000.0000 mg | ORAL_TABLET | Freq: Two times a day (BID) | ORAL | 6 refills | Status: DC

## 2018-10-18 MED FILL — MELOXICAM 15 MG TABLET: 15 | 30 days supply | Qty: 30 | Fill #4

## 2018-10-18 MED FILL — METHOCARBAMOL 500 MG TABS: 500 | 30 days supply | Qty: 90 | Fill #0

## 2018-10-18 MED FILL — ?HYDROCHLOROTHIAZIDE 25MG T: 25 | 30 days supply | Qty: 30 | Fill #6

## 2018-10-18 MED FILL — metFORMIN HCL 1000 MG TABS: 1000 | 30 days supply | Qty: 60 | Fill #0

## 2018-10-18 MED FILL — GABAPENTIN 300 MG CAPSULE: 300 | 30 days supply | Qty: 90 | Fill #4

## 2018-10-18 MED FILL — ATORVASTATIN CALCIUM 40 MG: 40 | 30 days supply | Qty: 30 | Fill #4

## 2018-10-18 NOTE — Patient Instructions (Signed)

## 2018-10-18 NOTE — Progress Notes (Signed)
Patient states that he is taking a half of pill every other day for his metformin.

## 2018-10-18 NOTE — Progress Notes (Signed)
Subjective:  Patient ID: Dillon Mcgee, male    DOB: 1960-07-02  Age: 58 y.o. MRN: [id number]  CC: Diabetes   HPI Dillon Mcgee is a 58 year old male with a history of hypertension, type 2 diabetes mellitus (A1c 8.6 in 04/2018), hyperlipidemia, obesity here for follow-up visit.  He is A1c today is 8.6 which has not changed much from 8.8 previously.  He states he has been taking half of one of his pills daily but then is not sure if he has been taking half of his metformin or Lipitor.  He gets his major exercise at his place of work.  Compliance with a diabetic diet cannot be ascertained. Blood sugar log reveals sugars ranging from 115 to 285. He is not up-to-date on annual eye exam.  At his last visit he had been advised to hold off on Lipitor for 1 week as he had complained of bilateral thigh weakness which is more pronounced when he climbs up ladders.  He has not been clear with his response regarding if he held his Lipitor or not but continues to experience those thigh symptoms.  He denies back pain  Past Medical History:  Diagnosis Date  . Diabetes mellitus without complication ([place])   . Hyperlipidemia   . Hypertension     Past Surgical History:  Procedure Laterality Date  . COLONOSCOPY     5 years ago     Family History  Problem Relation Age of Onset  . Heart disease Maternal Aunt   . Diabetes Maternal Uncle     Allergies  Allergen Reactions  . Pioglitazone Swelling    Lower extremity swelling.    Outpatient Medications Prior to Visit  Medication Sig Dispense Refill  . aspirin EC 81 MG tablet Take 1 tablet (81 mg total) daily by mouth. 90 tablet 3  . Blood Glucose Monitoring Suppl (TRUE METRIX GO GLUCOSE METER) w/Device KIT 1 each by Does not apply route every 8 (eight) hours as needed. 1 kit 0  . ferrous sulfate 325 (65 FE) MG tablet Take 325 mg by mouth daily with breakfast.    . glucose blood test strip Use as instructed 100 each 1  . Insulin Pen Needle  (TRUEPLUS PEN NEEDLES) 32G X 4 MM MISC Use as directed once daily 100 each 3  . Insulin Syringe-Needle U-100 (TRUEPLUS INSULIN SYRINGE) 30G X 5/16" 1 ML MISC Use as directed 4 times daily 300 each 3  . meloxicam (MOBIC) 15 MG tablet Take 1 tablet (15 mg total) by mouth daily. Take with food 90 tablet 3  . omega-3 acid ethyl esters (LOVAZA) 1 g capsule TAKE 1 CAPSULE BY MOUTH 2 TIMES DAILY. 60 capsule 2  . TRUEPLUS LANCETS 26G MISC 1 each by Does not apply route every 8 (eight) hours as needed. 100 each 12  . atorvastatin (LIPITOR) 40 MG tablet Take 1 tablet (40 mg total) by mouth daily. 30 tablet 6  . gabapentin (NEURONTIN) 300 MG capsule Take 1 capsule (300 mg total) by mouth 3 (three) times daily. 90 capsule 6  . glipiZIDE (GLUCOTROL) 10 MG tablet TAKE 1 TABLET BY MOUTH 2 TIMES DAILY BEFORE A MEAL. 60 tablet 2  . hydrochlorothiazide (HYDRODIURIL) 25 MG tablet Take 1 tablet (25 mg total) by mouth daily. 30 tablet 6  . insulin detemir (LEVEMIR) 100 UNIT/ML injection Inject 0.25 mLs (25 Units total) into the skin at bedtime. 30 mL 6  . liraglutide (VICTOZA) 18 MG/3ML SOPN Inject 0.3 mLs (1.8 mg total) into  the skin daily. 30 mL 6  . lisinopril (PRINIVIL,ZESTRIL) 10 MG tablet Take 0.5 tablets (5 mg total) by mouth daily. 30 tablet 6  . metFORMIN (GLUCOPHAGE) 1000 MG tablet Take 1 tablet (1,000 mg total) by mouth 2 (two) times daily with a meal. 60 tablet 6  . acetaminophen (TYLENOL) 500 MG tablet Take 2 tablets (1,000 mg total) by mouth every 6 (six) hours as needed for moderate pain. (Patient not taking: Reported on 07/15/2018) 30 tablet 0  . triamcinolone cream (KENALOG) 0.1 % Apply 1 application topically 2 (two) times daily. (Patient not taking: Reported on 10/01/2017) 30 g 2  . methocarbamol (ROBAXIN) 500 MG tablet Take 1 tablet (500 mg total) by mouth every 8 (eight) hours as needed for muscle spasms. (Patient not taking: Reported on 07/15/2018) 90 tablet 1   No facility-administered medications  prior to visit.      ROS Review of Systems  Objective:  BP 127/76   Pulse 62   Temp 98.5 F (36.9 C) (Oral)   Ht '6\' 4"'  (1.93 m)   Wt (!) 362 lb (164.2 kg)   SpO2 100%   BMI 44.06 kg/m   BP/Weight 10/18/2018 04/13/2018 40/11/8117  Systolic BP 147 829 562  Diastolic BP 76 70 74  Wt. (Lbs) 362 358.4 367.6  BMI 44.06 43.63 44.75      Physical Exam Constitutional:      Appearance: He is well-developed.  Cardiovascular:     Rate and Rhythm: Normal rate.     Heart sounds: Normal heart sounds. No murmur.  Pulmonary:     Effort: Pulmonary effort is normal.     Breath sounds: Normal breath sounds. No wheezing or rales.  Chest:     Chest wall: No tenderness.  Abdominal:     General: Bowel sounds are normal. There is no distension.     Palpations: Abdomen is soft. There is no mass.     Tenderness: There is no abdominal tenderness.  Musculoskeletal: Normal range of motion.     Comments: L shin erythema and superficial scabs  Neurological:     Mental Status: He is alert and oriented to person, place, and time.   Sensory exam of the foot is normal, tested with the monofilament. Good pulses, no lesions or ulcers, good peripheral pulses.   CMP Latest Ref Rng & Units 04/13/2018 10/08/2017 08/14/2016  Glucose 65 - 99 mg/dL 123(H) 177(H) 113(H)  BUN 6 - 24 mg/dL 23 26(H) 18  Creatinine 0.76 - 1.27 mg/dL 1.18 1.33(H) 1.04  Sodium 134 - 144 mmol/L 144 144 146(H)  Potassium 3.5 - 5.2 mmol/L 5.6(H) 5.2 5.4(H)  Chloride 96 - 106 mmol/L 111(H) 108(H) 106  CO2 20 - 29 mmol/L 19(L) 23 26  Calcium 8.7 - 10.2 mg/dL 9.3 8.8 9.4  Total Protein 6.0 - 8.5 g/dL 6.6 6.2 6.2  Total Bilirubin 0.0 - 1.2 mg/dL 0.3 0.4 <0.2  Alkaline Phos 39 - 117 IU/L 81 79 70  AST 0 - 40 IU/L 25 32 19  ALT 0 - 44 IU/L 21 37 16    Lipid Panel     Component Value Date/Time   CHOL 137 04/13/2018 1017   TRIG 104 04/13/2018 1017   HDL 39 (L) 04/13/2018 1017   CHOLHDL 3.5 04/13/2018 1017   CHOLHDL 3.5 06/07/2015  1005   VLDL 33 (H) 06/07/2015 1005   LDLCALC 77 04/13/2018 1017    CBC    Component Value Date/Time   WBC 7.4 12/03/2015 1704  RBC 4.17 (L) 12/03/2015 1704   HGB 12.6 (L) 12/03/2015 1704   HCT 38.9 12/03/2015 1704   PLT 288 12/03/2015 1704   MCV 93.3 12/03/2015 1704   MCH 30.2 12/03/2015 1704   MCHC 32.4 12/03/2015 1704   RDW 13.1 12/03/2015 1704   LYMPHSABS 2,294 12/03/2015 1704   MONOABS 518 12/03/2015 1704   EOSABS 148 12/03/2015 1704   BASOSABS 74 12/03/2015 1704    Lab Results  Component Value Date   HGBA1C 8.6 (A) 10/18/2018    Assessment & Plan:   1. Type 2 diabetes mellitus with diabetic polyneuropathy, with long-term current use of insulin (HCC) Uncontrolled with A1c of 8.6 Increased dose of Levemir from 25 units to 30 units Counseled on Diabetic diet, my plate method, 741 minutes of moderate intensity exercise/week Keep blood sugar logs with fasting goals of 80-120 mg/dl, random of less than 180 and in the event of sugars less than 60 mg/dl or greater than 400 mg/dl please notify the clinic ASAP. It is recommended that you undergo annual eye exams and annual foot exams. Pneumonia vaccine is recommended. - POCT glucose (manual entry) - POCT glycosylated hemoglobin (Hb A1C) - metFORMIN (GLUCOPHAGE) 1000 MG tablet; Take 1 tablet (1,000 mg total) by mouth 2 (two) times daily with a meal.  Dispense: 60 tablet; Refill: 6 - insulin detemir (LEVEMIR) 100 UNIT/ML injection; Inject 0.3 mLs (30 Units total) into the skin at bedtime.  Dispense: 30 mL; Refill: 6 - glipiZIDE (GLUCOTROL) 10 MG tablet; Take 1 tablet (10 mg total) by mouth 2 (two) times daily before a meal.  Dispense: 60 tablet; Refill: 6 - liraglutide (VICTOZA) 18 MG/3ML SOPN; Inject 0.3 mLs (1.8 mg total) into the skin daily.  Dispense: 30 mL; Refill: 6 - gabapentin (NEURONTIN) 300 MG capsule; Take 1 capsule (300 mg total) by mouth 3 (three) times daily.  Dispense: 90 capsule; Refill: 6 - CMP14+EGFR - Lipid  panel - Microalbumin / creatinine urine ratio  2. Hyperlipidemia, unspecified hyperlipidemia type Controlled He continues to have bilateral thigh pain and weakness Advised to hold off on Lipitor for 1 week in the event that this could be statin related - atorvastatin (LIPITOR) 40 MG tablet; Take 1 tablet (40 mg total) by mouth daily.  Dispense: 30 tablet; Refill: 6  3. Bilateral thigh pain See #2 above - methocarbamol (ROBAXIN) 500 MG tablet; Take 1 tablet (500 mg total) by mouth every 8 (eight) hours as needed for muscle spasms.  Dispense: 90 tablet; Refill: 1  4. Essential hypertension Controlled Counseled on blood pressure goal of less than 130/80, low-sodium, DASH diet, medication compliance, 150 minutes of moderate intensity exercise per week. Discussed medication compliance, adverse effects. - lisinopril (ZESTRIL) 10 MG tablet; Take 0.5 tablets (5 mg total) by mouth daily.  Dispense: 30 tablet; Refill: 6 - hydrochlorothiazide (HYDRODIURIL) 25 MG tablet; Take 1 tablet (25 mg total) by mouth daily.  Dispense: 30 tablet; Refill: 6   Health Care Maintenance: Lack of  medical coverage precludes him from having an eye exam and colonoscopy.  Community resources for eye exams discussed.  Meds ordered this encounter  Medications  . metFORMIN (GLUCOPHAGE) 1000 MG tablet    Sig: Take 1 tablet (1,000 mg total) by mouth 2 (two) times daily with a meal.    Dispense:  60 tablet    Refill:  6  . insulin detemir (LEVEMIR) 100 UNIT/ML injection    Sig: Inject 0.3 mLs (30 Units total) into the skin at bedtime.  Dispense:  30 mL    Refill:  6    Dose increase  . glipiZIDE (GLUCOTROL) 10 MG tablet    Sig: Take 1 tablet (10 mg total) by mouth 2 (two) times daily before a meal.    Dispense:  60 tablet    Refill:  6  . liraglutide (VICTOZA) 18 MG/3ML SOPN    Sig: Inject 0.3 mLs (1.8 mg total) into the skin daily.    Dispense:  30 mL    Refill:  6    Discontinue NovoLog  . lisinopril  (ZESTRIL) 10 MG tablet    Sig: Take 0.5 tablets (5 mg total) by mouth daily.    Dispense:  30 tablet    Refill:  6    Discontinue lisinopril/HCTZ  . atorvastatin (LIPITOR) 40 MG tablet    Sig: Take 1 tablet (40 mg total) by mouth daily.    Dispense:  30 tablet    Refill:  6  . methocarbamol (ROBAXIN) 500 MG tablet    Sig: Take 1 tablet (500 mg total) by mouth every 8 (eight) hours as needed for muscle spasms.    Dispense:  90 tablet    Refill:  1  . gabapentin (NEURONTIN) 300 MG capsule    Sig: Take 1 capsule (300 mg total) by mouth 3 (three) times daily.    Dispense:  90 capsule    Refill:  6  . hydrochlorothiazide (HYDRODIURIL) 25 MG tablet    Sig: Take 1 tablet (25 mg total) by mouth daily.    Dispense:  30 tablet    Refill:  6    Follow-up: Return in about 3 months (around 01/18/2019) for medical conditions.       Charlott Rakes, MD, FAAFP. [facility] [place], [place]   10/18/2018, 9:26 AM

## 2018-10-19 ENCOUNTER — Other Ambulatory Visit: Payer: Self-pay | Admitting: Pharmacist

## 2018-10-19 ENCOUNTER — Other Ambulatory Visit: Payer: Self-pay | Admitting: Family Medicine

## 2018-10-19 DIAGNOSIS — I10 Essential (primary) hypertension: Secondary | ICD-10-CM

## 2018-10-19 LAB — CMP14+EGFR
ALT: 23 IU/L (ref 0–44)
AST: 25 IU/L (ref 0–40)
Albumin/Globulin Ratio: 1.5 (ref 1.2–2.2)
Albumin: 4.1 g/dL (ref 3.8–4.9)
Alkaline Phosphatase: 81 IU/L (ref 39–117)
BUN/Creatinine Ratio: 20 (ref 9–20)
BUN: 25 mg/dL — ABNORMAL HIGH (ref 6–24)
Bilirubin Total: 0.3 mg/dL (ref 0.0–1.2)
CO2: 23 mmol/L (ref 20–29)
Calcium: 9.4 mg/dL (ref 8.7–10.2)
Chloride: 108 mmol/L — ABNORMAL HIGH (ref 96–106)
Creatinine, Ser: 1.27 mg/dL (ref 0.76–1.27)
GFR calc Af Amer: 73 mL/min/{1.73_m2} (ref >59)
GFR calc non Af Amer: 63 mL/min/{1.73_m2} (ref >59)
Globulin, Total: 2.8 g/dL (ref 1.5–4.5)
Glucose: 129 mg/dL — ABNORMAL HIGH (ref 65–99)
Potassium: 6.1 mmol/L — ABNORMAL HIGH (ref 3.5–5.2)
Sodium: 142 mmol/L (ref 134–144)
Total Protein: 6.9 g/dL (ref 6.0–8.5)

## 2018-10-19 LAB — MICROALBUMIN / CREATININE URINE RATIO
Creatinine, Urine: 73.3 mg/dL
Microalb/Creat Ratio: 7 mg/g creat (ref 0–29)
Microalbumin, Urine: 5.2 ug/mL

## 2018-10-19 LAB — LIPID PANEL
Chol/HDL Ratio: 3.8 ratio (ref 0.0–5.0)
Cholesterol, Total: 169 mg/dL (ref 100–199)
HDL: 44 mg/dL (ref >39)
LDL Calculated: 102 mg/dL — ABNORMAL HIGH (ref 0–99)
Triglycerides: 117 mg/dL (ref 0–149)
VLDL Cholesterol Cal: 23 mg/dL (ref 5–40)

## 2018-10-19 MED ORDER — SODIUM POLYSTYRENE SULFONATE 15 GM/60ML PO SUSP: 60.0000 g | Freq: Once | ORAL | 0 refills | Status: AC

## 2018-10-19 MED ORDER — SODIUM POLYSTYRENE SULFONATE 15 GM/60ML PO SUSP: 60.0000 g | Freq: Once | ORAL | 0 refills | Status: DC

## 2018-10-19 MED ORDER — LISINOPRIL 2.5 MG PO TABS: 2.5000 mg | ORAL_TABLET | Freq: Every day | ORAL | 6 refills | Status: DC

## 2018-10-19 MED FILL — LISINOPRIL 2.5 MG TABLET: 2.5 | 30 days supply | Qty: 30 | Fill #0

## 2018-10-19 MED FILL — SPS 15 GM/60 ML SUSPENSION: 15 | 1 days supply | Qty: 240 | Fill #0

## 2018-10-20 ENCOUNTER — Telehealth: Payer: Self-pay

## 2018-10-20 NOTE — Telephone Encounter (Signed)
Patient returned phone call and informed of lab results and medication changes.

## 2018-10-20 NOTE — Telephone Encounter (Signed)
Patient was called and a voicemail was left informing patient to return phone call for lab results. 

## 2018-10-20 NOTE — Telephone Encounter (Signed)
-----   Message from Charlott Rakes, MD sent at 10/19/2018  1:40 PM EDT ----- Cholesterol is normal but potassium is severely elevated.  I have sent a prescription for Kayexalate to his pharmacy which might give him some diarrhea but will bring the potassium levels down.  Lisinopril dose has been decreased to 2.5 mg daily as this could be the culprit.  Please schedule him for repeat labs in 2 weeks.  Thank you.

## 2018-10-25 ENCOUNTER — Telehealth: Payer: Self-pay | Admitting: Family Medicine

## 2018-10-25 MED FILL — $VICTOZA 2-PAK 18MG/3ML PEN: 18 | 30 days supply | Qty: 9 | Fill #4

## 2018-10-25 NOTE — Telephone Encounter (Signed)
Pt called and would like to know if he should be taking atorvastatin (LIPITOR) 40 MG tablet [[id number]] tomorrow or if should hold off on it..please follow up

## 2018-10-27 NOTE — Telephone Encounter (Signed)
He had complained of bilateral thigh pains for which I had asked him to hold Lipitor for 1 week to assess if symptoms resolved.  If symptoms are unchanged after holding Lipitor for 1 week then he can resume Lipitor as muscle spasms are unlikely to be related to Lipitor.

## 2018-10-27 NOTE — Telephone Encounter (Signed)
Patient states that he is feeling much better since stopping the Lipitor.

## 2018-10-27 NOTE — Telephone Encounter (Signed)
Will route to PCP for review. 

## 2018-10-28 NOTE — Telephone Encounter (Signed)
He can hold the Lipitor and commence omega 3 fish oil capsules, work on a low cholesterol diet until next appointment with me.

## 2018-11-01 NOTE — Telephone Encounter (Signed)
Patient was called and informed to take OTC fish oil.

## 2018-11-03 ENCOUNTER — Ambulatory Visit: Payer: Self-pay | Attending: Family Medicine

## 2018-11-03 ENCOUNTER — Other Ambulatory Visit: Payer: Self-pay

## 2018-11-03 DIAGNOSIS — I10 Essential (primary) hypertension: Secondary | ICD-10-CM

## 2018-11-04 LAB — BASIC METABOLIC PANEL
BUN/Creatinine Ratio: 16 (ref 9–20)
BUN: 19 mg/dL (ref 6–24)
CO2: 23 mmol/L (ref 20–29)
Calcium: 9.3 mg/dL (ref 8.7–10.2)
Chloride: 106 mmol/L (ref 96–106)
Creatinine, Ser: 1.19 mg/dL (ref 0.76–1.27)
GFR calc Af Amer: 78 mL/min/{1.73_m2} (ref >59)
GFR calc non Af Amer: 68 mL/min/{1.73_m2} (ref >59)
Glucose: 163 mg/dL — ABNORMAL HIGH (ref 65–99)
Potassium: 5.2 mmol/L (ref 3.5–5.2)
Sodium: 141 mmol/L (ref 134–144)

## 2018-11-05 ENCOUNTER — Telehealth: Payer: Self-pay

## 2018-11-05 NOTE — Telephone Encounter (Signed)
-----   Message from Charlott Rakes, MD sent at 11/04/2018 11:36 AM EDT ----- Potassium level is down to normal

## 2018-11-05 NOTE — Telephone Encounter (Signed)
Patient name and DOB has been verified Patient was informed of lab results. Patient had no questions.  

## 2018-11-09 MED FILL — MELOXICAM 15 MG TABLET: 15 | 30 days supply | Qty: 30 | Fill #5

## 2018-11-09 MED FILL — glipiZIDE 10 MG TABS: 10 | 30 days supply | Qty: 60 | Fill #0

## 2018-11-09 MED FILL — LISINOPRIL 2.5 MG TABLET: 2.5 | 30 days supply | Qty: 30 | Fill #1

## 2018-11-09 MED FILL — OMEGA-3 ETHYL ESTERS 1 GM C: 1 | 30 days supply | Qty: 60 | Fill #2

## 2018-11-15 MED FILL — ?HYDROCHLOROTHIAZIDE 25MG T: 25 | 30 days supply | Qty: 30 | Fill #0

## 2018-11-22 MED FILL — TRUEPLUS SYR 0.5ML 30GX5/16: 30G X 5/16" | 25 days supply | Qty: 100 | Fill #3

## 2018-11-22 MED FILL — metFORMIN HCL 1000 MG TABS: 1000 | 30 days supply | Qty: 60 | Fill #1

## 2018-11-26 MED FILL — $VICTOZA 2-PAK 18MG/3ML PEN: 18 | 30 days supply | Qty: 9 | Fill #5

## 2018-11-29 MED FILL — $LEVEMIR 100U/ML VIAL: 100 | 33 days supply | Qty: 10 | Fill #0

## 2018-12-06 ENCOUNTER — Other Ambulatory Visit: Payer: Self-pay | Admitting: Family Medicine

## 2018-12-06 DIAGNOSIS — E1169 Type 2 diabetes mellitus with other specified complication: Secondary | ICD-10-CM

## 2018-12-06 MED FILL — glipiZIDE 10 MG TABS: 10 | 30 days supply | Qty: 60 | Fill #1

## 2018-12-06 MED FILL — GABAPENTIN 300 MG CAPSULE: 300 | 30 days supply | Qty: 90 | Fill #5

## 2018-12-06 MED FILL — OMEGA-3 ETHYL ESTERS 1 GM C: 1 | 30 days supply | Qty: 60 | Fill #0

## 2018-12-13 ENCOUNTER — Other Ambulatory Visit: Payer: Self-pay | Admitting: Pharmacist

## 2018-12-13 MED ORDER — TRUE METRIX BLOOD GLUCOSE TEST VI STRP: ORAL_STRIP | 12 refills | Status: DC

## 2018-12-13 MED FILL — TRUE METRIX TEST STRIP: 25 days supply | Qty: 100 | Fill #0

## 2018-12-13 MED FILL — LISINOPRIL 2.5 MG TABLET: 2.5 | 30 days supply | Qty: 30 | Fill #2

## 2018-12-13 MED FILL — MELOXICAM 15 MG TABLET: 15 | 30 days supply | Qty: 30 | Fill #6

## 2018-12-20 MED FILL — ?HYDROCHLOROTHIAZIDE 25MG T: 25 | 30 days supply | Qty: 30 | Fill #1

## 2018-12-20 MED FILL — metFORMIN HCL 1000 MG TABS: 1000 | 30 days supply | Qty: 60 | Fill #2

## 2018-12-27 MED FILL — glipiZIDE 10 MG TABS: 10 | 30 days supply | Qty: 60 | Fill #2

## 2019-01-03 MED FILL — $VICTOZA 2-PAK 18MG/3ML PEN: 18 | 30 days supply | Qty: 9 | Fill #6

## 2019-01-03 MED FILL — $LEVEMIR 100U/ML VIAL: 100 | 33 days supply | Qty: 10 | Fill #1

## 2019-01-10 MED FILL — OMEGA-3 ETHYL ESTERS 1 GM C: 1 | 30 days supply | Qty: 60 | Fill #1

## 2019-01-13 ENCOUNTER — Other Ambulatory Visit: Payer: Self-pay | Admitting: Family Medicine

## 2019-01-13 DIAGNOSIS — E1142 Type 2 diabetes mellitus with diabetic polyneuropathy: Secondary | ICD-10-CM

## 2019-01-13 MED FILL — TRUEPLUS PEN NDL 32GX5/32": 32G X 4 MM | 90 days supply | Qty: 100 | Fill #0

## 2019-01-13 MED FILL — TRUEPLUS PEN NDL 32GX5/32: 32G X 4 MM | 90 days supply | Qty: 100 | Fill #0

## 2019-01-13 MED FILL — LISINOPRIL 2.5 MG TABLET: 2.5 | 30 days supply | Qty: 30 | Fill #3

## 2019-01-13 MED FILL — GABAPENTIN 300 MG CAPSULE: 300 | 30 days supply | Qty: 90 | Fill #6

## 2019-01-13 MED FILL — MELOXICAM 15 MG TABLET: 15 | 30 days supply | Qty: 30 | Fill #0

## 2019-01-18 ENCOUNTER — Other Ambulatory Visit: Payer: Self-pay

## 2019-01-18 ENCOUNTER — Encounter: Payer: Self-pay | Admitting: Family Medicine

## 2019-01-18 ENCOUNTER — Ambulatory Visit: Payer: Self-pay | Attending: Family Medicine | Admitting: Family Medicine

## 2019-01-18 VITALS — BP 132/76 | HR 62 | Temp 98.3°F | Ht 76.0 in | Wt 366.4 lb

## 2019-01-18 DIAGNOSIS — E7800 Pure hypercholesterolemia, unspecified: Secondary | ICD-10-CM

## 2019-01-18 DIAGNOSIS — G720 Drug-induced myopathy: Secondary | ICD-10-CM | POA: Insufficient documentation

## 2019-01-18 DIAGNOSIS — T466X5A Adverse effect of antihyperlipidemic and antiarteriosclerotic drugs, initial encounter: Secondary | ICD-10-CM

## 2019-01-18 DIAGNOSIS — E1142 Type 2 diabetes mellitus with diabetic polyneuropathy: Secondary | ICD-10-CM

## 2019-01-18 DIAGNOSIS — R399 Unspecified symptoms and signs involving the genitourinary system: Secondary | ICD-10-CM

## 2019-01-18 DIAGNOSIS — R3915 Urgency of urination: Secondary | ICD-10-CM

## 2019-01-18 DIAGNOSIS — R351 Nocturia: Secondary | ICD-10-CM

## 2019-01-18 DIAGNOSIS — Z794 Long term (current) use of insulin: Secondary | ICD-10-CM

## 2019-01-18 LAB — POCT GLYCOSYLATED HEMOGLOBIN (HGB A1C): HbA1c, POC (controlled diabetic range): 8.4 % — AB (ref 0.0–7.0)

## 2019-01-18 LAB — GLUCOSE, POCT (MANUAL RESULT ENTRY): POC Glucose: 111 mg/dl — AB (ref 70–99)

## 2019-01-18 MED ORDER — INSULIN DETEMIR 100 UNIT/ML ~~LOC~~ SOLN: 20.0000 [IU] | Freq: Two times a day (BID) | SUBCUTANEOUS | 6 refills | Status: DC

## 2019-01-18 MED ORDER — TAMSULOSIN HCL 0.4 MG PO CAPS: 0.4000 mg | ORAL_CAPSULE | Freq: Every day | ORAL | 6 refills | Status: DC

## 2019-01-18 MED FILL — TAMSULOSIN HCL 0.4 MG CAP: 0.4 | 30 days supply | Qty: 30 | Fill #0

## 2019-01-18 MED FILL — $LEVEMIR 100U/ML VIAL: 100 | 50 days supply | Qty: 20 | Fill #0

## 2019-01-18 NOTE — Patient Instructions (Signed)
Increase Levemir to 20 units twice daily.  If your blood sugars run less than 70, decreased to 18 units twice daily.

## 2019-01-18 NOTE — Progress Notes (Signed)
Subjective:  Patient ID: Dillon Mcgee, male    DOB: 08-03-1960  Age: 58 y.o. MRN: [id number]  CC: Diabetes   HPI Da Authement is a 58 year old male with a history of hypertension, type 2 diabetes mellitus (A1c 8.4), hyperlipidemia, obesity here for follow-up visit.  He has been off Lipitor due to myalgias especially in his thigh and reports resolution of symptoms ever since he discontinued it. He endorses compliance with his Levemir, Victoza, glipizide, Metformin and denies hypoglycemia, numbness in extremities, visual concerns.  Fasting sugars have been between 96 and 140 and he has a couple of random sugars in the 180s and 190s. He is compliant with his antihypertensive and denies adverse effects from his medication.  Today he complains of nocturia, urgency, hesitancy and sense of incomplete voiding for the last couple of months but denies abdominal pain.  Past Medical History:  Diagnosis Date  . Diabetes mellitus without complication ([place])   . Hyperlipidemia   . Hypertension     Past Surgical History:  Procedure Laterality Date  . COLONOSCOPY     5 years ago     Family History  Problem Relation Age of Onset  . Heart disease Maternal Aunt   . Diabetes Maternal Uncle     Allergies  Allergen Reactions  . Pioglitazone Swelling    Lower extremity swelling.    Outpatient Medications Prior to Visit  Medication Sig Dispense Refill  . aspirin EC 81 MG tablet Take 1 tablet (81 mg total) daily by mouth. 90 tablet 3  . Blood Glucose Monitoring Suppl (TRUE METRIX GO GLUCOSE METER) w/Device KIT 1 each by Does not apply route every 8 (eight) hours as needed. 1 kit 0  . ferrous sulfate 325 (65 FE) MG tablet Take 325 mg by mouth daily with breakfast.    . gabapentin (NEURONTIN) 300 MG capsule Take 1 capsule (300 mg total) by mouth 3 (three) times daily. 90 capsule 6  . glipiZIDE (GLUCOTROL) 10 MG tablet Take 1 tablet (10 mg total) by mouth 2 (two) times daily before a meal. 60  tablet 6  . glucose blood (TRUE METRIX BLOOD GLUCOSE TEST) test strip Use as instructed 100 each 12  . hydrochlorothiazide (HYDRODIURIL) 25 MG tablet Take 1 tablet (25 mg total) by mouth daily. 30 tablet 6  . Insulin Syringe-Needle U-100 (TRUEPLUS INSULIN SYRINGE) 30G X 5/16" 1 ML MISC Use as directed 4 times daily 300 each 3  . liraglutide (VICTOZA) 18 MG/3ML SOPN Inject 0.3 mLs (1.8 mg total) into the skin daily. 30 mL 6  . lisinopril (ZESTRIL) 2.5 MG tablet Take 1 tablet (2.5 mg total) by mouth daily. 30 tablet 6  . meloxicam (MOBIC) 15 MG tablet TAKE 1 TABLET (15 MG TOTAL) BY MOUTH DAILY. TAKE WITH FOOD 30 tablet 0  . metFORMIN (GLUCOPHAGE) 1000 MG tablet Take 1 tablet (1,000 mg total) by mouth 2 (two) times daily with a meal. 60 tablet 6  . omega-3 acid ethyl esters (LOVAZA) 1 g capsule TAKE 1 CAPSULE BY MOUTH 2 TIMES DAILY. 60 capsule 2  . TRUEPLUS LANCETS 26G MISC 1 each by Does not apply route every 8 (eight) hours as needed. 100 each 12  . TRUEPLUS PEN NEEDLES 32G X 4 MM MISC USE AS DIRECTED ONCE DAILY 100 each 0  . atorvastatin (LIPITOR) 40 MG tablet Take 1 tablet (40 mg total) by mouth daily. 30 tablet 6  . insulin detemir (LEVEMIR) 100 UNIT/ML injection Inject 0.3 mLs (30 Units total) into the  skin at bedtime. 30 mL 6  . acetaminophen (TYLENOL) 500 MG tablet Take 2 tablets (1,000 mg total) by mouth every 6 (six) hours as needed for moderate pain. (Patient not taking: Reported on 07/15/2018) 30 tablet 0  . methocarbamol (ROBAXIN) 500 MG tablet Take 1 tablet (500 mg total) by mouth every 8 (eight) hours as needed for muscle spasms. (Patient not taking: Reported on 01/18/2019) 90 tablet 1  . triamcinolone cream (KENALOG) 0.1 % Apply 1 application topically 2 (two) times daily. (Patient not taking: Reported on 10/01/2017) 30 g 2   No facility-administered medications prior to visit.      ROS Review of Systems  Constitutional: Negative for activity change and appetite change.  HENT:  Negative for sinus pressure and sore throat.   Eyes: Negative for visual disturbance.  Respiratory: Negative for cough, chest tightness and shortness of breath.   Cardiovascular: Negative for chest pain and leg swelling.  Gastrointestinal: Negative for abdominal distention, abdominal pain, constipation and diarrhea.  Endocrine: Negative.   Genitourinary: Negative for dysuria.  Musculoskeletal: Negative for joint swelling and myalgias.  Skin: Negative for rash.  Allergic/Immunologic: Negative.   Neurological: Negative for weakness, light-headedness and numbness.  Psychiatric/Behavioral: Negative for dysphoric mood and suicidal ideas.    Objective:  BP 132/76   Pulse 62   Temp 98.3 F (36.8 C) (Oral)   Ht _0  (1.93 m)   Wt (!) 366 lb 6.4 oz (166.2 kg)   SpO2 98%   BMI 44.60 kg/m   BP/Weight 01/18/2019 10/18/2018 1/61/0960  Systolic BP 454 098 119  Diastolic BP 76 76 70  Wt. (Lbs) 366.4 362 358.4  BMI 44.6 44.06 43.63      Physical Exam Constitutional:      Appearance: He is well-developed.  Neck:     Vascular: No JVD.  Cardiovascular:     Rate and Rhythm: Normal rate.     Heart sounds: Normal heart sounds. No murmur.  Pulmonary:     Effort: Pulmonary effort is normal.     Breath sounds: Normal breath sounds. No wheezing or rales.  Chest:     Chest wall: No tenderness.  Abdominal:     General: Bowel sounds are normal. There is distension.     Palpations: Abdomen is soft. There is no mass.     Tenderness: There is no abdominal tenderness.  Musculoskeletal: Normal range of motion.     Right lower leg: No edema.     Left lower leg: No edema.  Neurological:     Mental Status: He is alert and oriented to person, place, and time.  Psychiatric:        Mood and Affect: Mood normal.     CMP Latest Ref Rng & Units 11/03/2018 10/18/2018 04/13/2018  Glucose 65 - 99 mg/dL 163(H) 129(H) 123(H)  BUN 6 - 24 mg/dL 19 25(H) 23  Creatinine 0.76 - 1.27 mg/dL 1.19 1.27 1.18   Sodium 134 - 144 mmol/L 141 142 144  Potassium 3.5 - 5.2 mmol/L 5.2 6.1(H) 5.6(H)  Chloride 96 - 106 mmol/L 106 108(H) 111(H)  CO2 20 - 29 mmol/L 23 23 19(L)  Calcium 8.7 - 10.2 mg/dL 9.3 9.4 9.3  Total Protein 6.0 - 8.5 g/dL - 6.9 6.6  Total Bilirubin 0.0 - 1.2 mg/dL - 0.3 0.3  Alkaline Phos 39 - 117 IU/L - 81 81  AST 0 - 40 IU/L - 25 25  ALT 0 - 44 IU/L - 23 21    Lipid  Panel     Component Value Date/Time   CHOL 169 10/18/2018 0948   TRIG 117 10/18/2018 0948   HDL 44 10/18/2018 0948   CHOLHDL 3.8 10/18/2018 0948   CHOLHDL 3.5 06/07/2015 1005   VLDL 33 (H) 06/07/2015 1005   LDLCALC 102 (H) 10/18/2018 0948    CBC    Component Value Date/Time   WBC 7.4 12/03/2015 1704   RBC 4.17 (L) 12/03/2015 1704   HGB 12.6 (L) 12/03/2015 1704   HCT 38.9 12/03/2015 1704   PLT 288 12/03/2015 1704   MCV 93.3 12/03/2015 1704   MCH 30.2 12/03/2015 1704   MCHC 32.4 12/03/2015 1704   RDW 13.1 12/03/2015 1704   LYMPHSABS 2,294 12/03/2015 1704   MONOABS 518 12/03/2015 1704   EOSABS 148 12/03/2015 1704   BASOSABS 74 12/03/2015 1704    Lab Results  Component Value Date   HGBA1C 8.4 (A) 01/18/2019    Assessment & Plan:   1. Type 2 diabetes mellitus with diabetic polyneuropathy, with long-term current use of insulin (HCC) Uncontrolled with A1c of 8.4; goal is less than 7 Levemir changed to twice daily dosing-20 units twice daily and he has been advised to decrease by 2 units twice daily in the event of hypoglycemia Counseled on Diabetic diet, my plate method, 272 minutes of moderate intensity exercise/week Blood sugar logs with fasting goals of 80-120 mg/dl, random of less than 180 and in the event of sugars less than 60 mg/dl or greater than 400 mg/dl encouraged to notify the clinic. Advised on the need for annual eye exams, annual foot exams, Pneumonia vaccine. - Glucose (CBG) - HgB A1c - Lipid panel - Complete Metabolic Panel with GFR - insulin detemir (LEVEMIR) 100 UNIT/ML  injection; Inject 0.2 mLs (20 Units total) into the skin 2 (two) times daily.  Dispense: 30 mL; Refill: 6  2. Lower urinary tract symptoms Commence Flomax - PSA, total and free - tamsulosin (FLOMAX) 0.4 MG CAPS capsule; Take 1 capsule (0.4 mg total) by mouth daily.  Dispense: 30 capsule; Refill: 6  3. Pure hypercholesterolemia/ statin myopathy LDL of 102 which is above goal of less than 100 Unable to tolerate Lipitor due to myopathy and he has been on fish oil capsules We will check lipid panel and if elevated will try low-dose pravastatin   Meds ordered this encounter  Medications  . tamsulosin (FLOMAX) 0.4 MG CAPS capsule    Sig: Take 1 capsule (0.4 mg total) by mouth daily.    Dispense:  30 capsule    Refill:  6  . insulin detemir (LEVEMIR) 100 UNIT/ML injection    Sig: Inject 0.2 mLs (20 Units total) into the skin 2 (two) times daily.    Dispense:  30 mL    Refill:  6    Dose increase    Follow-up: Return in about 3 months (around 04/20/2019) for Medical conditions.       Charlott Rakes, MD, FAAFP. [facility] [place], [place]   01/18/2019, 9:20 AM

## 2019-01-19 LAB — CMP14+EGFR
ALT: 35 IU/L (ref 0–44)
AST: 33 IU/L (ref 0–40)
Albumin/Globulin Ratio: 1.7 (ref 1.2–2.2)
Albumin: 4.4 g/dL (ref 3.8–4.9)
Alkaline Phosphatase: 92 IU/L (ref 39–117)
BUN/Creatinine Ratio: 18 (ref 9–20)
BUN: 22 mg/dL (ref 6–24)
Bilirubin Total: 0.3 mg/dL (ref 0.0–1.2)
CO2: 25 mmol/L (ref 20–29)
Calcium: 9.5 mg/dL (ref 8.7–10.2)
Chloride: 105 mmol/L (ref 96–106)
Creatinine, Ser: 1.25 mg/dL (ref 0.76–1.27)
GFR calc Af Amer: 74 mL/min/{1.73_m2} (ref >59)
GFR calc non Af Amer: 64 mL/min/{1.73_m2} (ref >59)
Globulin, Total: 2.6 g/dL (ref 1.5–4.5)
Glucose: 124 mg/dL — ABNORMAL HIGH (ref 65–99)
Potassium: 5.6 mmol/L — ABNORMAL HIGH (ref 3.5–5.2)
Sodium: 141 mmol/L (ref 134–144)
Total Protein: 7 g/dL (ref 6.0–8.5)

## 2019-01-19 LAB — LIPID PANEL
Chol/HDL Ratio: 5.6 ratio — ABNORMAL HIGH (ref 0.0–5.0)
Cholesterol, Total: 251 mg/dL — ABNORMAL HIGH (ref 100–199)
HDL: 45 mg/dL (ref >39)
LDL Chol Calc (NIH): 172 mg/dL — ABNORMAL HIGH (ref 0–99)
Triglycerides: 183 mg/dL — ABNORMAL HIGH (ref 0–149)
VLDL Cholesterol Cal: 34 mg/dL (ref 5–40)

## 2019-01-19 LAB — PSA, TOTAL AND FREE
PSA, Free Pct: 30 %
PSA, Free: 0.18 ng/mL
Prostate Specific Ag, Serum: 0.6 ng/mL (ref 0.0–4.0)

## 2019-01-20 ENCOUNTER — Other Ambulatory Visit: Payer: Self-pay | Admitting: Family Medicine

## 2019-01-20 DIAGNOSIS — E875 Hyperkalemia: Secondary | ICD-10-CM

## 2019-01-20 MED ORDER — PRAVASTATIN SODIUM 20 MG PO TABS: 20.0000 mg | ORAL_TABLET | Freq: Every day | ORAL | 3 refills | Status: DC

## 2019-01-20 MED FILL — PRAVASTATIN SODIUM 20 MG TA: 20 | 30 days supply | Qty: 30 | Fill #0

## 2019-01-21 ENCOUNTER — Telehealth: Payer: Self-pay

## 2019-01-21 MED FILL — ?HYDROCHLOROTHIAZIDE 25MG T: 25 | 30 days supply | Qty: 30 | Fill #2

## 2019-01-21 MED FILL — metFORMIN HCL 1000 MG TABS: 1000 | 30 days supply | Qty: 60 | Fill #3

## 2019-01-21 NOTE — Telephone Encounter (Signed)
Patient was called and informed of lab results. Patient was also informed of medication changes and referral being placed.

## 2019-01-21 NOTE — Telephone Encounter (Signed)
-----   Message from Charlott Rakes, MD sent at 01/20/2019  8:44 AM EST ----- Labs reveal cholesterol is significantly elevated due to discontinuation of Lipitor.  I have sent a prescription for pravastatin to his pharmacy and if he develops muscle spasms he would need to notify the clinic.  I would also like him to hold off on lisinopril due to elevated potassium and I have referred him to a nephrologist for further evaluation.

## 2019-02-02 MED FILL — $VICTOZA 2-PAK 18MG/3ML PEN: 18 | 30 days supply | Qty: 9 | Fill #0

## 2019-02-02 MED FILL — glipiZIDE 10 MG TABS: 10 | 30 days supply | Qty: 60 | Fill #3

## 2019-02-14 ENCOUNTER — Other Ambulatory Visit: Payer: Self-pay | Admitting: Family Medicine

## 2019-02-14 ENCOUNTER — Other Ambulatory Visit: Payer: Self-pay | Admitting: Pharmacist

## 2019-02-14 MED ORDER — MELOXICAM 15 MG PO TABS: 15.0000 mg | ORAL_TABLET | Freq: Every day | ORAL | 1 refills | Status: DC

## 2019-02-14 MED FILL — PRAVASTATIN SODIUM 20 MG TA: 20 | 30 days supply | Qty: 30 | Fill #1

## 2019-02-14 MED FILL — ?HYDROCHLOROTHIAZIDE 25MG T: 25 | 30 days supply | Qty: 30 | Fill #3

## 2019-02-14 MED FILL — MELOXICAM 15 MG TABLET: 15 | 30 days supply | Qty: 30 | Fill #0

## 2019-02-14 MED FILL — TAMSULOSIN HCL 0.4 MG CAP: 0.4 | 30 days supply | Qty: 30 | Fill #1

## 2019-02-14 MED FILL — OMEGA-3 ETHYL ESTERS 1 GM C: 1 | 30 days supply | Qty: 60 | Fill #2

## 2019-02-21 MED FILL — metFORMIN HCL 1000 MG TABS: 1000 | 30 days supply | Qty: 60 | Fill #4

## 2019-03-02 MED FILL — $VICTOZA 2-PAK 18MG/3ML PEN: 18 | 90 days supply | Qty: 27 | Fill #1

## 2019-03-07 MED FILL — glipiZIDE 10 MG TABS: 10 | 30 days supply | Qty: 60 | Fill #4

## 2019-03-09 MED FILL — GABAPENTIN 300 MG CAPSULE: 300 | 30 days supply | Qty: 90 | Fill #0

## 2019-03-14 MED FILL — MELOXICAM 15 MG TABLET: 15 | 30 days supply | Qty: 30 | Fill #1

## 2019-03-14 MED FILL — PRAVASTATIN SODIUM 20 MG TA: 20 | 30 days supply | Qty: 30 | Fill #2

## 2019-03-14 MED FILL — ?HYDROCHLOROTHIAZIDE 25MG T: 25 | 30 days supply | Qty: 30 | Fill #4

## 2019-03-16 ENCOUNTER — Other Ambulatory Visit: Payer: Self-pay | Admitting: Family Medicine

## 2019-03-16 DIAGNOSIS — E785 Hyperlipidemia, unspecified: Secondary | ICD-10-CM

## 2019-03-16 DIAGNOSIS — E1169 Type 2 diabetes mellitus with other specified complication: Secondary | ICD-10-CM

## 2019-03-16 MED FILL — OMEGA-3 ETHYL ESTERS 1 GM C: 1 | 30 days supply | Qty: 60 | Fill #0

## 2019-03-16 MED FILL — TAMSULOSIN HCL 0.4 MG CAP: 0.4 | 30 days supply | Qty: 30 | Fill #2

## 2019-03-23 ENCOUNTER — Other Ambulatory Visit: Payer: Self-pay | Admitting: Family Medicine

## 2019-03-23 DIAGNOSIS — Z794 Long term (current) use of insulin: Secondary | ICD-10-CM

## 2019-03-23 DIAGNOSIS — E1142 Type 2 diabetes mellitus with diabetic polyneuropathy: Secondary | ICD-10-CM

## 2019-03-23 MED FILL — metFORMIN HCL 1000 MG TABS: 1000 | 30 days supply | Qty: 60 | Fill #5

## 2019-03-23 MED FILL — $LEVEMIR 100U/ML VIAL: 100 | 50 days supply | Qty: 20 | Fill #1

## 2019-03-24 MED FILL — TRUE METRIX TEST STRIP: 25 days supply | Qty: 100 | Fill #1

## 2019-03-25 ENCOUNTER — Other Ambulatory Visit: Payer: Self-pay | Admitting: Family Medicine

## 2019-03-25 MED FILL — ?HYDROCHLOROTHIAZIDE 25MG T: 25 | 30 days supply | Qty: 30 | Fill #5

## 2019-03-25 MED FILL — TRUEPLUS SYR 0.5ML 30GX5/16: 30G X 5/16" | 25 days supply | Qty: 100 | Fill #0

## 2019-04-04 MED FILL — glipiZIDE 10 MG TABS: 10 | 30 days supply | Qty: 60 | Fill #5

## 2019-04-11 ENCOUNTER — Other Ambulatory Visit: Payer: Self-pay | Admitting: Family Medicine

## 2019-04-11 MED FILL — MELOXICAM 15 MG TABLET: 15 | 30 days supply | Qty: 30 | Fill #0

## 2019-04-11 MED FILL — ?PRAVASTATIN NA 20MG TABL: 20 | 30 days supply | Qty: 30 | Fill #3

## 2019-04-19 ENCOUNTER — Encounter: Payer: Self-pay | Admitting: Family Medicine

## 2019-04-19 ENCOUNTER — Other Ambulatory Visit: Payer: Self-pay

## 2019-04-19 ENCOUNTER — Ambulatory Visit: Payer: Self-pay | Attending: Family Medicine | Admitting: Family Medicine

## 2019-04-19 VITALS — BP 126/74 | HR 72 | Ht 76.0 in | Wt 370.0 lb

## 2019-04-19 DIAGNOSIS — E7800 Pure hypercholesterolemia, unspecified: Secondary | ICD-10-CM

## 2019-04-19 DIAGNOSIS — Z1211 Encounter for screening for malignant neoplasm of colon: Secondary | ICD-10-CM

## 2019-04-19 DIAGNOSIS — E875 Hyperkalemia: Secondary | ICD-10-CM

## 2019-04-19 DIAGNOSIS — R3989 Other symptoms and signs involving the genitourinary system: Secondary | ICD-10-CM

## 2019-04-19 DIAGNOSIS — T466X5D Adverse effect of antihyperlipidemic and antiarteriosclerotic drugs, subsequent encounter: Secondary | ICD-10-CM

## 2019-04-19 DIAGNOSIS — G720 Drug-induced myopathy: Secondary | ICD-10-CM

## 2019-04-19 DIAGNOSIS — R399 Unspecified symptoms and signs involving the genitourinary system: Secondary | ICD-10-CM

## 2019-04-19 DIAGNOSIS — E1142 Type 2 diabetes mellitus with diabetic polyneuropathy: Secondary | ICD-10-CM

## 2019-04-19 DIAGNOSIS — Z794 Long term (current) use of insulin: Secondary | ICD-10-CM

## 2019-04-19 LAB — POCT GLYCOSYLATED HEMOGLOBIN (HGB A1C): HbA1c, POC (controlled diabetic range): 9 % — AB (ref 0.0–7.0)

## 2019-04-19 LAB — GLUCOSE, POCT (MANUAL RESULT ENTRY): POC Glucose: 133 mg/dl — AB (ref 70–99)

## 2019-04-19 MED ORDER — PRAVASTATIN SODIUM 20 MG PO TABS: 10.0000 mg | ORAL_TABLET | Freq: Every day | ORAL | 3 refills | Status: DC

## 2019-04-19 MED ORDER — METFORMIN HCL 1000 MG PO TABS: 1000.0000 mg | ORAL_TABLET | Freq: Two times a day (BID) | ORAL | 6 refills | Status: DC

## 2019-04-19 MED ORDER — TAMSULOSIN HCL 0.4 MG PO CAPS: 0.4000 mg | ORAL_CAPSULE | Freq: Every day | ORAL | 6 refills | Status: DC

## 2019-04-19 MED ORDER — INSULIN DETEMIR 100 UNIT/ML ~~LOC~~ SOLN: 25.0000 [IU] | Freq: Two times a day (BID) | SUBCUTANEOUS | 6 refills | Status: DC

## 2019-04-19 MED ORDER — GABAPENTIN 300 MG PO CAPS: 300.0000 mg | ORAL_CAPSULE | Freq: Three times a day (TID) | ORAL | 6 refills | Status: DC

## 2019-04-19 MED ORDER — VICTOZA 18 MG/3ML ~~LOC~~ SOPN: 1.8000 mg | PEN_INJECTOR | Freq: Every day | SUBCUTANEOUS | 6 refills | Status: DC

## 2019-04-19 NOTE — Progress Notes (Signed)
Established Patient Office Visit  Subjective:  Patient ID: Dillon Mcgee, male    DOB: [birth year]  Age: 59 y.o. MRN: [id number]  CC:  Chief Complaint  Patient presents with  . Diabetes    HPI Dillon Mcgee is a 59 year old male with a history of hypertension, type 2 diabetes mellitus (A1c 9.0), hyperlipidemia, and obesity who presents today for a follow up visit.  His blood pressure is controlled in the clinic today and he endorses compliance with antihypertensive.  Diabetes is uncontrolled with an A1c of 9.0 which is increased from 8.4 at his last visit.  Endorses compliance with medications.  His fasting sugars range from 130's to low 200's.  Denies hypoglycemic episodes and the lowest fasting sugar on his log is 90.  Patient states that he is trying to improve his diet with salads and fish but he acknowledges that he often is not compliant with a diabetic diet.  Patient states that he is unable to afford a diabetic eye exam.  Today the patient complains of myalgias in this bilateral lower extremities that began when he started pravastatin.  He endorses compliance with the medication but states that it impairs his ability to be physically active.  At his last visit, he was complaining of urinary symptoms and was started on tamsulosin and these symptoms have since resolved.  He was also noted to have hyperkalemia and was referred to nephrology but the patient states that he hasn't scheduled an appointment because he is unable to afford it.  Past Medical History:  Diagnosis Date  . Diabetes mellitus without complication ([place])   . Hyperlipidemia   . Hypertension     Past Surgical History:  Procedure Laterality Date  . COLONOSCOPY     5 years ago     Family History  Problem Relation Age of Onset  . Heart disease Maternal Aunt   . Diabetes Maternal Uncle     Social History   Socioeconomic History  . Marital status: Single    Spouse name: Not on file  . Number of children:  Not on file  . Years of education: Not on file  . Highest education level: Not on file  Occupational History  . Not on file  Tobacco Use  . Smoking status: Never Smoker  . Smokeless tobacco: Never Used  Substance and Sexual Activity  . Alcohol use: No    Alcohol/week: 0.0 standard drinks  . Drug use: Not on file  . Sexual activity: Not on file  Other Topics Concern  . Not on file  Social History Narrative  . Not on file   Social Determinants of Health   Financial Resource Strain:   . Difficulty of Paying Living Expenses: Not on file  Food Insecurity:   . Worried About Charity fundraiser in the Last Year: Not on file  . Ran Out of Food in the Last Year: Not on file  Transportation Needs:   . Lack of Transportation (Medical): Not on file  . Lack of Transportation (Non-Medical): Not on file  Physical Activity:   . Days of Exercise per Week: Not on file  . Minutes of Exercise per Session: Not on file  Stress:   . Feeling of Stress : Not on file  Social Connections:   . Frequency of Communication with Friends and Family: Not on file  . Frequency of Social Gatherings with Friends and Family: Not on file  . Attends Religious Services: Not on file  . Active Member  of Clubs or Organizations: Not on file  . Attends Archivist Meetings: Not on file  . Marital Status: Not on file  Intimate Partner Violence:   . Fear of Current or Ex-Partner: Not on file  . Emotionally Abused: Not on file  . Physically Abused: Not on file  . Sexually Abused: Not on file    Outpatient Medications Prior to Visit  Medication Sig Dispense Refill  . aspirin EC 81 MG tablet Take 1 tablet (81 mg total) daily by mouth. 90 tablet 3  . Blood Glucose Monitoring Suppl (TRUE METRIX GO GLUCOSE METER) w/Device KIT 1 each by Does not apply route every 8 (eight) hours as needed. 1 kit 0  . ferrous sulfate 325 (65 FE) MG tablet Take 325 mg by mouth daily with breakfast.    . glipiZIDE (GLUCOTROL) 10  MG tablet Take 1 tablet (10 mg total) by mouth 2 (two) times daily before a meal. 60 tablet 6  . glucose blood (TRUE METRIX BLOOD GLUCOSE TEST) test strip Use as instructed 100 each 12  . hydrochlorothiazide (HYDRODIURIL) 25 MG tablet Take 1 tablet (25 mg total) by mouth daily. 30 tablet 6  . meloxicam (MOBIC) 15 MG tablet TAKE 1 TABLET (15 MG TOTAL) BY MOUTH DAILY. TAKE WITH FOOD 30 tablet 1  . methocarbamol (ROBAXIN) 500 MG tablet Take 1 tablet (500 mg total) by mouth every 8 (eight) hours as needed for muscle spasms. 90 tablet 1  . omega-3 acid ethyl esters (LOVAZA) 1 g capsule TAKE 1 CAPSULE BY MOUTH 2 TIMES DAILY. 60 capsule 2  . TRUEPLUS INSULIN SYRINGE 30G X 5/16" 0.5 ML MISC USE AS DIRECTED 4 TIMES DAILY 100 each 3  . TRUEPLUS LANCETS 26G MISC 1 each by Does not apply route every 8 (eight) hours as needed. 100 each 12  . TRUEPLUS PEN NEEDLES 32G X 4 MM MISC USE AS DIRECTED ONCE DAILY 100 each 0  . gabapentin (NEURONTIN) 300 MG capsule Take 1 capsule (300 mg total) by mouth 3 (three) times daily. 90 capsule 6  . insulin detemir (LEVEMIR) 100 UNIT/ML injection Inject 0.2 mLs (20 Units total) into the skin 2 (two) times daily. 30 mL 6  . liraglutide (VICTOZA) 18 MG/3ML SOPN Inject 0.3 mLs (1.8 mg total) into the skin daily. 30 mL 6  . metFORMIN (GLUCOPHAGE) 1000 MG tablet Take 1 tablet (1,000 mg total) by mouth 2 (two) times daily with a meal. 60 tablet 6  . pravastatin (PRAVACHOL) 20 MG tablet Take 1 tablet (20 mg total) by mouth daily. 30 tablet 3  . tamsulosin (FLOMAX) 0.4 MG CAPS capsule Take 1 capsule (0.4 mg total) by mouth daily. 30 capsule 6  . acetaminophen (TYLENOL) 500 MG tablet Take 2 tablets (1,000 mg total) by mouth every 6 (six) hours as needed for moderate pain. (Patient not taking: Reported on 07/15/2018) 30 tablet 0  . triamcinolone cream (KENALOG) 0.1 % Apply 1 application topically 2 (two) times daily. (Patient not taking: Reported on 10/01/2017) 30 g 2   No  facility-administered medications prior to visit.    Allergies  Allergen Reactions  . Pioglitazone Swelling    Lower extremity swelling.    ROS Review of Systems  Constitutional: Negative for fatigue, fever and unexpected weight change.  HENT: Negative for congestion, rhinorrhea, sinus pressure and sinus pain.   Eyes: Negative for visual disturbance.  Respiratory: Negative for cough, chest tightness and shortness of breath.   Cardiovascular: Negative for chest pain, palpitations and  leg swelling.  Gastrointestinal: Negative for abdominal distention, abdominal pain, constipation, diarrhea, nausea and vomiting.  Endocrine: Negative for polydipsia and polyuria.  Genitourinary: Negative for decreased urine volume, difficulty urinating and dysuria.  Musculoskeletal: Positive for myalgias. Negative for arthralgias.       BLE  Skin: Negative for color change and rash.  Neurological: Negative for dizziness, tremors, weakness and numbness.  Hematological: Does not bruise/bleed easily.  Psychiatric/Behavioral: Negative for agitation and behavioral problems.      Objective:    Physical Exam  Constitutional: He is oriented to person, place, and time. He appears well-developed and well-nourished.  HENT:  Head: Normocephalic and atraumatic.  Eyes: Pupils are equal, round, and reactive to light. Conjunctivae and EOM are normal.  Cardiovascular: Normal rate, regular rhythm, normal heart sounds and intact distal pulses.  No murmur heard. Pulmonary/Chest: Effort normal and breath sounds normal. No respiratory distress. He has no wheezes.  Abdominal: Bowel sounds are normal. He exhibits distension. There is no abdominal tenderness.  Musculoskeletal:        General: No edema. Normal range of motion.  Neurological: He is alert and oriented to person, place, and time.  Skin: Skin is warm and dry. No rash noted.  Psychiatric: He has a normal mood and affect. His behavior is normal.  Nursing note  and vitals reviewed.   BP 126/74   Pulse 72   Ht '6\' 4"'  (1.93 m)   Wt (!) 370 lb (167.8 kg)   SpO2 97%   BMI 45.04 kg/m  Wt Readings from Last 3 Encounters:  04/19/19 (!) 370 lb (167.8 kg)  01/18/19 (!) 366 lb 6.4 oz (166.2 kg)  10/18/18 (!) 362 lb (164.2 kg)     Health Maintenance Due  Topic Date Due  . OPHTHALMOLOGY EXAM  07/26/1972  . COLONOSCOPY  07/26/2012    There are no preventive care reminders to display for this patient.  Lab Results  Component Value Date   TSH 1.413 02/20/2014   Lab Results  Component Value Date   WBC 7.4 12/03/2015   HGB 12.6 (L) 12/03/2015   HCT 38.9 12/03/2015   MCV 93.3 12/03/2015   PLT 288 12/03/2015   Lab Results  Component Value Date   NA 141 01/18/2019   K 5.6 (H) 01/18/2019   CO2 25 01/18/2019   GLUCOSE 124 (H) 01/18/2019   BUN 22 01/18/2019   CREATININE 1.25 01/18/2019   BILITOT 0.3 01/18/2019   ALKPHOS 92 01/18/2019   AST 33 01/18/2019   ALT 35 01/18/2019   PROT 7.0 01/18/2019   ALBUMIN 4.4 01/18/2019   CALCIUM 9.5 01/18/2019   Lab Results  Component Value Date   CHOL 251 (H) 01/18/2019   Lab Results  Component Value Date   HDL 45 01/18/2019   Lab Results  Component Value Date   LDLCALC 172 (H) 01/18/2019   Lab Results  Component Value Date   TRIG 183 (H) 01/18/2019   Lab Results  Component Value Date   CHOLHDL 5.6 (H) 01/18/2019   Lab Results  Component Value Date   HGBA1C 9.0 (A) 04/19/2019      Assessment & Plan:   1. Type 2 diabetes mellitus with diabetic polyneuropathy, with long-term current use of insulin ([facility]) Uncontrolled with A1c of 9.0; goal is A1c <7.0 Increase Levemir from 20 units BID to 25 units BID Counseled on Diabetic diet, my plate method, 124 minutes of moderate intensity exercise/week Keep blood sugar logs with fasting goals of 80-120 mg/dl, random  of less than 180 and in the event of sugars less than 60 mg/dl or greater than 400 mg/dl please notify the clinic ASAP. It is  recommended that you undergo annual eye exams and annual foot exams. - POCT glucose (manual entry) - POCT glycosylated hemoglobin (Hb A1C) - gabapentin (NEURONTIN) 300 MG capsule; Take 1 capsule (300 mg total) by mouth 3 (three) times daily.  Dispense: 90 capsule; Refill: 6 - metFORMIN (GLUCOPHAGE) 1000 MG tablet; Take 1 tablet (1,000 mg total) by mouth 2 (two) times daily with a meal.  Dispense: 60 tablet; Refill: 6 - liraglutide (VICTOZA) 18 MG/3ML SOPN; Inject 0.3 mLs (1.8 mg total) into the skin daily.  Dispense: 30 mL; Refill: 6 - insulin detemir (LEVEMIR) 100 UNIT/ML injection; Inject 0.25 mLs (25 Units total) into the skin 2 (two) times daily.  Dispense: 30 mL; Refill: 6 - Basic Metabolic Panel  2. Pure hypercholesterolemia Uncontrolled Inability to tolerate even low intensity statin Reduce pravastatin from 20 mg daily to 10 mg daily if tolerated Educated that if he is unable to tolerate the daily dose, he may reduce to 20 mg two days per week. Low cholesterol diet Counseled on 150 minutes of moderate intensity exercise/week   3. Statin myopathy See #2  4. Lower urinary tract symptoms Stable Encouraged hydration and medication compliance - tamsulosin (FLOMAX) 0.4 MG CAPS capsule; Take 1 capsule (0.4 mg total) by mouth daily.  Dispense: 30 capsule; Refill: 6  5. Screening for colon cancer Screening - Fecal occult blood, imunochemical([facility]); Future  6. Obesity, morbid, BMI 50 or higher ([place]) Encouraged a low calorie diet and 150 minutes of moderate intensity exercise/week  7. Hyperkalemia Reevaluate today Lisinopril discontinued at last visit Rule out hypoaldosteronism - Aldosterone + renin activity w/ ratio   Meds ordered this encounter  Medications  . gabapentin (NEURONTIN) 300 MG capsule    Sig: Take 1 capsule (300 mg total) by mouth 3 (three) times daily.    Dispense:  90 capsule    Refill:  6  . metFORMIN (GLUCOPHAGE) 1000 MG tablet    Sig: Take 1  tablet (1,000 mg total) by mouth 2 (two) times daily with a meal.    Dispense:  60 tablet    Refill:  6  . tamsulosin (FLOMAX) 0.4 MG CAPS capsule    Sig: Take 1 capsule (0.4 mg total) by mouth daily.    Dispense:  30 capsule    Refill:  6  . liraglutide (VICTOZA) 18 MG/3ML SOPN    Sig: Inject 0.3 mLs (1.8 mg total) into the skin daily.    Dispense:  30 mL    Refill:  6    Discontinue NovoLog  . insulin detemir (LEVEMIR) 100 UNIT/ML injection    Sig: Inject 0.25 mLs (25 Units total) into the skin 2 (two) times daily.    Dispense:  30 mL    Refill:  6    Dose increase; please mail  . pravastatin (PRAVACHOL) 20 MG tablet    Sig: Take 0.5 tablets (10 mg total) by mouth daily.    Dispense:  90 tablet    Refill:  3    Follow-up: Return in about 3 months (around 07/17/2019) for Diabetes mellitus- in person.    Tomasita Morrow, RN

## 2019-04-19 NOTE — Patient Instructions (Signed)
Fat and Cholesterol Restricted Eating Plan Getting too much fat and cholesterol in your diet may cause health problems. Choosing the right foods helps keep your fat and cholesterol at normal levels. This can keep you from getting certain diseases. Your doctor may recommend an eating plan that includes:  Total fat: ______% or less of total calories a day.  Saturated fat: ______% or less of total calories a day.  Cholesterol: less than _________mg a day.  Fiber: ______g a day. What are tips for following this plan? Meal planning  At meals, divide your plate into four equal parts: ? Fill one-half of your plate with vegetables and green salads. ? Fill one-fourth of your plate with whole grains. ? Fill one-fourth of your plate with low-fat (lean) protein foods.  Eat fish that is high in omega-3 fats at least two times a week. This includes mackerel, tuna, sardines, and salmon.  Eat foods that are high in fiber, such as whole grains, beans, apples, broccoli, carrots, peas, and barley. General tips   Work with your doctor to lose weight if you need to.  Avoid: ? Foods with added sugar. ? Fried foods. ? Foods with partially hydrogenated oils.  Limit alcohol intake to no more than 1 drink a day for nonpregnant women and 2 drinks a day for men. One drink equals 12 oz of beer, 5 oz of wine, or 1 oz of hard liquor. Reading food labels  Check food labels for: ? Trans fats. ? Partially hydrogenated oils. ? Saturated fat (g) in each serving. ? Cholesterol (mg) in each serving. ? Fiber (g) in each serving.  Choose foods with healthy fats, such as: ? Monounsaturated fats. ? Polyunsaturated fats. ? Omega-3 fats.  Choose grain products that have whole grains. Look for the word "whole" as the first word in the ingredient list. Cooking  Cook foods using low-fat methods. These include baking, boiling, grilling, and broiling.  Eat more home-cooked foods. Eat at restaurants and buffets  less often.  Avoid cooking using saturated fats, such as butter, cream, palm oil, palm kernel oil, and coconut oil. Recommended foods  Fruits  All fresh, canned (in natural juice), or frozen fruits. Vegetables  Fresh or frozen vegetables (raw, steamed, roasted, or grilled). Green salads. Grains  Whole grains, such as whole wheat or whole grain breads, crackers, cereals, and pasta. Unsweetened oatmeal, bulgur, barley, quinoa, or brown rice. Corn or whole wheat flour tortillas. Meats and other protein foods  Ground beef (85% or leaner), grass-fed beef, or beef trimmed of fat. Skinless chicken or turkey. Ground chicken or turkey. Pork trimmed of fat. All fish and seafood. Egg whites. Dried beans, peas, or lentils. Unsalted nuts or seeds. Unsalted canned beans. Nut butters without added sugar or oil. Dairy  Low-fat or nonfat dairy products, such as skim or 1% milk, 2% or reduced-fat cheeses, low-fat and fat-free ricotta or cottage cheese, or plain low-fat and nonfat yogurt. Fats and oils  Tub margarine without trans fats. Light or reduced-fat mayonnaise and salad dressings. Avocado. Olive, canola, sesame, or safflower oils. The items listed above may not be a complete list of foods and beverages you can eat. Contact a dietitian for more information. Foods to avoid Fruits  Canned fruit in heavy syrup. Fruit in cream or butter sauce. Fried fruit. Vegetables  Vegetables cooked in cheese, cream, or butter sauce. Fried vegetables. Grains  White bread. White pasta. White rice. Cornbread. Bagels, pastries, and croissants. Crackers and snack foods that contain trans fat   and hydrogenated oils. Meats and other protein foods  Fatty cuts of meat. Ribs, chicken wings, bacon, sausage, bologna, salami, chitterlings, fatback, hot dogs, bratwurst, and packaged lunch meats. Liver and organ meats. Whole eggs and egg yolks. Chicken and turkey with skin. Fried meat. Dairy  Whole or 2% milk, cream,  half-and-half, and cream cheese. Whole milk cheeses. Whole-fat or sweetened yogurt. Full-fat cheeses. Nondairy creamers and whipped toppings. Processed cheese, cheese spreads, and cheese curds. Beverages  Alcohol. Sugar-sweetened drinks such as sodas, lemonade, and fruit drinks. Fats and oils  Butter, stick margarine, lard, shortening, ghee, or bacon fat. Coconut, palm kernel, and palm oils. Sweets and desserts  Corn syrup, sugars, honey, and molasses. Candy. Jam and jelly. Syrup. Sweetened cereals. Cookies, pies, cakes, donuts, muffins, and ice cream. The items listed above may not be a complete list of foods and beverages you should avoid. Contact a dietitian for more information. Summary  Choosing the right foods helps keep your fat and cholesterol at normal levels. This can keep you from getting certain diseases.  At meals, fill one-half of your plate with vegetables and green salads.  Eat high-fiber foods, like whole grains, beans, apples, carrots, peas, and barley.  Limit added sugar, saturated fats, alcohol, and fried foods. This information is not intended to replace advice given to you by your health care provider. Make sure you discuss any questions you have with your health care provider. Document Revised: 10/21/2017 Document Reviewed: 11/04/2016 Elsevier Patient Education  2020 [employer]  

## 2019-04-19 NOTE — Progress Notes (Signed)
Pravastatin is causing leg pain.

## 2019-04-20 MED FILL — OMEGA-3 ETHYL ESTERS 1 GM C: 1 | 30 days supply | Qty: 60 | Fill #1

## 2019-04-20 MED FILL — TAMSULOSIN HCL 0.4 MG CAP: 0.4 | 30 days supply | Qty: 30 | Fill #3

## 2019-04-20 MED FILL — GABAPENTIN 300 MG CAPSULE: 300 | 30 days supply | Qty: 90 | Fill #1

## 2019-04-20 NOTE — Addendum Note (Signed)
Addended byMemory Dance on: 04/20/2019 10:41 AM   Modules accepted: Orders

## 2019-04-21 LAB — FECAL OCCULT BLOOD, IMMUNOCHEMICAL: Fecal Occult Bld: NEGATIVE

## 2019-04-22 ENCOUNTER — Telehealth: Payer: Self-pay

## 2019-04-22 NOTE — Telephone Encounter (Signed)
-----   Message from Hoy Register, MD sent at 04/22/2019 11:58 AM EST ----- Stool occult test is negative.  Repeat recommended in 1 year

## 2019-04-22 NOTE — Telephone Encounter (Signed)
Patient name and DOB has been verified Patient was informed of lab results. Patient had no questions.  

## 2019-04-25 MED FILL — $LEVEMIR 100U/ML VIAL: 100 | 40 days supply | Qty: 20 | Fill #0

## 2019-04-25 MED FILL — metFORMIN HCL 1000 MG TABS: 1000 | 30 days supply | Qty: 60 | Fill #6

## 2019-04-26 LAB — BASIC METABOLIC PANEL
BUN/Creatinine Ratio: 16 (ref 9–20)
BUN: 19 mg/dL (ref 6–24)
CO2: 24 mmol/L (ref 20–29)
Calcium: 9.6 mg/dL (ref 8.7–10.2)
Chloride: 104 mmol/L (ref 96–106)
Creatinine, Ser: 1.22 mg/dL (ref 0.76–1.27)
GFR calc Af Amer: 76 mL/min/{1.73_m2} (ref >59)
GFR calc non Af Amer: 66 mL/min/{1.73_m2} (ref >59)
Glucose: 127 mg/dL — ABNORMAL HIGH (ref 65–99)
Potassium: 5.1 mmol/L (ref 3.5–5.2)
Sodium: 142 mmol/L (ref 134–144)

## 2019-04-26 LAB — ALDOSTERONE + RENIN ACTIVITY W/ RATIO
ALDOS/RENIN RATIO: 2.4 (ref 0.0–30.0)
ALDOSTERONE: 2.1 ng/dL (ref 0.0–30.0)
Renin: 0.892 ng/mL/hr (ref 0.167–5.380)

## 2019-04-28 ENCOUNTER — Telehealth: Payer: Self-pay

## 2019-04-28 NOTE — Telephone Encounter (Signed)
Patient name and DOB has been verified Patient was informed of lab results. Patient had no questions.  

## 2019-04-28 NOTE — Telephone Encounter (Signed)
-----   Message from Hoy Register, MD sent at 04/28/2019  1:14 PM EST ----- Blood tests are normal

## 2019-05-02 MED FILL — ?GLIPIZIDE 10 MG TABLET: 10 | 30 days supply | Qty: 60 | Fill #6

## 2019-05-12 MED FILL — MELOXICAM 15 MG TABLET: 15 | 30 days supply | Qty: 30 | Fill #1

## 2019-05-12 MED FILL — TAMSULOSIN HCL 0.4 MG CAP: 0.4 | 30 days supply | Qty: 30 | Fill #4

## 2019-05-16 MED FILL — OMEGA-3 ETHYL ESTERS 1 GM C: 1 | 30 days supply | Qty: 60 | Fill #2

## 2019-05-23 MED FILL — metFORMIN HCL 1000 MG TABS: 1000 | 30 days supply | Qty: 60 | Fill #0

## 2019-05-30 ENCOUNTER — Other Ambulatory Visit: Payer: Self-pay | Admitting: Family Medicine

## 2019-05-30 DIAGNOSIS — E1142 Type 2 diabetes mellitus with diabetic polyneuropathy: Secondary | ICD-10-CM

## 2019-05-30 DIAGNOSIS — Z794 Long term (current) use of insulin: Secondary | ICD-10-CM

## 2019-05-30 MED FILL — ?HYDROCHLOROTHIAZIDE 25MG T: 25 | 30 days supply | Qty: 30 | Fill #6

## 2019-05-30 MED FILL — GABAPENTIN 300 MG CAPSULE: 300 | 30 days supply | Qty: 90 | Fill #2

## 2019-05-31 MED FILL — ?GLIPIZIDE 10 MG TABLET: 10 | 30 days supply | Qty: 60 | Fill #0

## 2019-06-07 MED FILL — $LEVEMIR 100U/ML VIAL: 100 | 80 days supply | Qty: 40 | Fill #1

## 2019-06-07 MED FILL — $VICTOZA 2-PAK 18MG/3ML PEN: 18 | 90 days supply | Qty: 27 | Fill #2

## 2019-06-13 ENCOUNTER — Other Ambulatory Visit: Payer: Self-pay | Admitting: Family Medicine

## 2019-06-14 MED FILL — MELOXICAM 15 MG TABLET: 15 | 30 days supply | Qty: 30 | Fill #0

## 2019-06-17 ENCOUNTER — Other Ambulatory Visit: Payer: Self-pay | Admitting: Family Medicine

## 2019-06-17 DIAGNOSIS — E785 Hyperlipidemia, unspecified: Secondary | ICD-10-CM

## 2019-06-17 DIAGNOSIS — E1169 Type 2 diabetes mellitus with other specified complication: Secondary | ICD-10-CM

## 2019-06-17 MED FILL — TAMSULOSIN HCL 0.4 MG CAP: 0.4 | 30 days supply | Qty: 30 | Fill #5

## 2019-06-17 MED FILL — OMEGA-3 ETHYL ESTERS 1 GM C: 1 | 30 days supply | Qty: 60 | Fill #0

## 2019-06-17 MED FILL — MELOXICAM 15 MG TABLET: 15 | 30 days supply | Qty: 30 | Fill #1

## 2019-06-23 MED FILL — metFORMIN HCL 1000 MG TABS: 1000 | 30 days supply | Qty: 60 | Fill #1

## 2019-06-27 ENCOUNTER — Other Ambulatory Visit: Payer: Self-pay | Admitting: Family Medicine

## 2019-06-27 DIAGNOSIS — I10 Essential (primary) hypertension: Secondary | ICD-10-CM

## 2019-06-27 MED FILL — ?GLIPIZIDE 10 MG TABLET: 10 | 30 days supply | Qty: 60 | Fill #1

## 2019-06-27 MED FILL — GABAPENTIN 300 MG CAPSULE: 300 | 30 days supply | Qty: 90 | Fill #3

## 2019-06-28 MED FILL — HYDROCHLOROTHIAZIDE 25 MG T: 25 | 30 days supply | Qty: 30 | Fill #0

## 2019-07-12 ENCOUNTER — Other Ambulatory Visit: Payer: Self-pay

## 2019-07-12 ENCOUNTER — Ambulatory Visit: Payer: Self-pay | Attending: Family Medicine | Admitting: Family Medicine

## 2019-07-12 ENCOUNTER — Encounter: Payer: Self-pay | Admitting: Family Medicine

## 2019-07-12 ENCOUNTER — Other Ambulatory Visit: Payer: Self-pay | Admitting: Family Medicine

## 2019-07-12 VITALS — BP 131/81 | HR 71 | Ht 76.0 in | Wt 373.0 lb

## 2019-07-12 DIAGNOSIS — T466X5A Adverse effect of antihyperlipidemic and antiarteriosclerotic drugs, initial encounter: Secondary | ICD-10-CM

## 2019-07-12 DIAGNOSIS — N3000 Acute cystitis without hematuria: Secondary | ICD-10-CM

## 2019-07-12 DIAGNOSIS — E1142 Type 2 diabetes mellitus with diabetic polyneuropathy: Secondary | ICD-10-CM

## 2019-07-12 DIAGNOSIS — Z794 Long term (current) use of insulin: Secondary | ICD-10-CM

## 2019-07-12 DIAGNOSIS — R399 Unspecified symptoms and signs involving the genitourinary system: Secondary | ICD-10-CM

## 2019-07-12 DIAGNOSIS — I10 Essential (primary) hypertension: Secondary | ICD-10-CM

## 2019-07-12 DIAGNOSIS — E7800 Pure hypercholesterolemia, unspecified: Secondary | ICD-10-CM

## 2019-07-12 DIAGNOSIS — G720 Drug-induced myopathy: Secondary | ICD-10-CM

## 2019-07-12 LAB — POCT URINALYSIS DIP (CLINITEK)
Bilirubin, UA: NEGATIVE
Blood, UA: NEGATIVE
Glucose, UA: 100 mg/dL — AB
Ketones, POC UA: NEGATIVE mg/dL
Nitrite, UA: NEGATIVE
POC PROTEIN,UA: NEGATIVE
Spec Grav, UA: 1.025 (ref 1.010–1.025)
Urobilinogen, UA: 1 E.U./dL
pH, UA: 6.5 (ref 5.0–8.0)

## 2019-07-12 LAB — GLUCOSE, POCT (MANUAL RESULT ENTRY): POC Glucose: 164 mg/dl — AB (ref 70–99)

## 2019-07-12 MED ORDER — TAMSULOSIN HCL 0.4 MG PO CAPS: 0.4000 mg | ORAL_CAPSULE | Freq: Every day | ORAL | 6 refills | Status: DC

## 2019-07-12 MED ORDER — GLIPIZIDE 10 MG PO TABS: 10.0000 mg | ORAL_TABLET | Freq: Two times a day (BID) | ORAL | 6 refills | Status: DC

## 2019-07-12 MED ORDER — PRAVASTATIN SODIUM 20 MG PO TABS: 10.0000 mg | ORAL_TABLET | Freq: Every day | ORAL | 1 refills | Status: DC

## 2019-07-12 MED ORDER — CEPHALEXIN 500 MG PO CAPS: 500.0000 mg | ORAL_CAPSULE | Freq: Two times a day (BID) | ORAL | 0 refills | Status: DC

## 2019-07-12 MED ORDER — VICTOZA 18 MG/3ML ~~LOC~~ SOPN: 1.8000 mg | PEN_INJECTOR | Freq: Every day | SUBCUTANEOUS | 6 refills | Status: DC

## 2019-07-12 MED ORDER — HYDROCHLOROTHIAZIDE 25 MG PO TABS: 25.0000 mg | ORAL_TABLET | Freq: Every day | ORAL | 6 refills | Status: DC

## 2019-07-12 MED ORDER — GABAPENTIN 300 MG PO CAPS: 300.0000 mg | ORAL_CAPSULE | Freq: Three times a day (TID) | ORAL | 6 refills | Status: DC

## 2019-07-12 MED ORDER — METFORMIN HCL 1000 MG PO TABS: 1000.0000 mg | ORAL_TABLET | Freq: Two times a day (BID) | ORAL | 6 refills | Status: DC

## 2019-07-12 NOTE — Patient Instructions (Signed)

## 2019-07-12 NOTE — Progress Notes (Signed)
Subjective:  Patient ID: Dillon Mcgee, male    DOB: Sep 20, 1960  Age: 59 y.o. MRN: [id number]  CC: Diabetes   HPI Dillon Mcgee is a 59 year old male with a history of hypertension, type 2 diabetes mellitus (A1c 9.0), hyperlipidemia, obesity here for follow-up visit. Blood sugars are 160-210; yet to make an appointment with ophthalmologist due to lack of medical coverage.  Neuropathy is controlled on gabapentin. He has urinary urgency and nocturia and sometimes has to skip his hydrochlorothiazide due to the symptoms.  He is on Flomax for lower urinary tract symptoms.  Denies presence of dysuria or hematuria. He takes Pravastatin twice a week on mondays and fridays as he was to tolerate atorvastatin in the myopathy.  Past Medical History:  Diagnosis Date  . Diabetes mellitus without complication ([place])   . Hyperlipidemia   . Hypertension     Past Surgical History:  Procedure Laterality Date  . COLONOSCOPY     5 years ago     Family History  Problem Relation Age of Onset  . Heart disease Maternal Aunt   . Diabetes Maternal Uncle     Allergies  Allergen Reactions  . Pioglitazone Swelling    Lower extremity swelling.    Outpatient Medications Prior to Visit  Medication Sig Dispense Refill  . aspirin EC 81 MG tablet Take 1 tablet (81 mg total) daily by mouth. 90 tablet 3  . Blood Glucose Monitoring Suppl (TRUE METRIX GO GLUCOSE METER) w/Device KIT 1 each by Does not apply route every 8 (eight) hours as needed. 1 kit 0  . ferrous sulfate 325 (65 FE) MG tablet Take 325 mg by mouth daily with breakfast.    . gabapentin (NEURONTIN) 300 MG capsule Take 1 capsule (300 mg total) by mouth 3 (three) times daily. 90 capsule 6  . glipiZIDE (GLUCOTROL) 10 MG tablet TAKE 1 TABLET (10 MG TOTAL) BY MOUTH 2 (TWO) TIMES DAILY BEFORE A MEAL. 60 tablet 2  . glucose blood (TRUE METRIX BLOOD GLUCOSE TEST) test strip Use as instructed 100 each 12  . hydrochlorothiazide (HYDRODIURIL) 25 MG  tablet TAKE 1 TABLET (25 MG TOTAL) BY MOUTH DAILY. 30 tablet 2  . insulin detemir (LEVEMIR) 100 UNIT/ML injection Inject 0.25 mLs (25 Units total) into the skin 2 (two) times daily. 30 mL 6  . liraglutide (VICTOZA) 18 MG/3ML SOPN Inject 0.3 mLs (1.8 mg total) into the skin daily. 30 mL 6  . meloxicam (MOBIC) 15 MG tablet TAKE 1 TABLET (15 MG TOTAL) BY MOUTH DAILY. TAKE WITH FOOD 30 tablet 1  . metFORMIN (GLUCOPHAGE) 1000 MG tablet Take 1 tablet (1,000 mg total) by mouth 2 (two) times daily with a meal. 60 tablet 6  . omega-3 acid ethyl esters (LOVAZA) 1 g capsule TAKE 1 CAPSULE BY MOUTH 2 TIMES DAILY. 60 capsule 2  . pravastatin (PRAVACHOL) 20 MG tablet Take 0.5 tablets (10 mg total) by mouth daily. 90 tablet 3  . tamsulosin (FLOMAX) 0.4 MG CAPS capsule Take 1 capsule (0.4 mg total) by mouth daily. 30 capsule 6  . TRUEPLUS INSULIN SYRINGE 30G X 5/16" 0.5 ML MISC USE AS DIRECTED 4 TIMES DAILY 100 each 3  . TRUEPLUS LANCETS 26G MISC 1 each by Does not apply route every 8 (eight) hours as needed. 100 each 12  . TRUEPLUS PEN NEEDLES 32G X 4 MM MISC USE AS DIRECTED ONCE DAILY 100 each 0  . acetaminophen (TYLENOL) 500 MG tablet Take 2 tablets (1,000 mg total) by mouth every  6 (six) hours as needed for moderate pain. (Patient not taking: Reported on 07/15/2018) 30 tablet 0  . methocarbamol (ROBAXIN) 500 MG tablet Take 1 tablet (500 mg total) by mouth every 8 (eight) hours as needed for muscle spasms. (Patient not taking: Reported on 07/12/2019) 90 tablet 1  . triamcinolone cream (KENALOG) 0.1 % Apply 1 application topically 2 (two) times daily. (Patient not taking: Reported on 10/01/2017) 30 g 2   No facility-administered medications prior to visit.     ROS Review of Systems  Constitutional: Negative for activity change and appetite change.  HENT: Negative for sinus pressure and sore throat.   Eyes: Negative for visual disturbance.  Respiratory: Negative for cough, chest tightness and shortness of  breath.   Cardiovascular: Negative for chest pain and leg swelling.  Gastrointestinal: Negative for abdominal distention, abdominal pain, constipation and diarrhea.  Endocrine: Negative.   Genitourinary: Negative for dysuria.  Musculoskeletal: Negative for joint swelling and myalgias.  Skin: Negative for rash.  Allergic/Immunologic: Negative.   Neurological: Negative for weakness, light-headedness and numbness.  Psychiatric/Behavioral: Negative for dysphoric mood and suicidal ideas.    Objective:  BP 131/81   Pulse 71   Ht '6\' 4"'  (1.93 m)   Wt (!) 373 lb (169.2 kg)   SpO2 95%   BMI 45.40 kg/m   BP/Weight 07/12/2019 04/19/2019 32/01/2481  Systolic BP 500 370 488  Diastolic BP 81 74 76  Wt. (Lbs) 373 370 366.4  BMI 45.4 45.04 44.6      Physical Exam Constitutional:      Appearance: He is well-developed.  Neck:     Vascular: No JVD.  Cardiovascular:     Rate and Rhythm: Normal rate.     Heart sounds: Normal heart sounds. No murmur.  Pulmonary:     Effort: Pulmonary effort is normal.     Breath sounds: Normal breath sounds. No wheezing or rales.  Chest:     Chest wall: No tenderness.  Abdominal:     General: Bowel sounds are normal. There is distension.     Palpations: Abdomen is soft. There is no mass.     Tenderness: There is no abdominal tenderness.  Musculoskeletal:        General: Normal range of motion.     Right lower leg: No edema.     Left lower leg: No edema.  Neurological:     Mental Status: He is alert and oriented to person, place, and time.  Psychiatric:        Mood and Affect: Mood normal.     CMP Latest Ref Rng & Units 04/19/2019 01/18/2019 11/03/2018  Glucose 65 - 99 mg/dL 127(H) 124(H) 163(H)  BUN 6 - 24 mg/dL '19 22 19  ' Creatinine 0.76 - 1.27 mg/dL 1.22 1.25 1.19  Sodium 134 - 144 mmol/L 142 141 141  Potassium 3.5 - 5.2 mmol/L 5.1 5.6(H) 5.2  Chloride 96 - 106 mmol/L 104 105 106  CO2 20 - 29 mmol/L '24 25 23  ' Calcium 8.7 - 10.2 mg/dL 9.6 9.5  9.3  Total Protein 6.0 - 8.5 g/dL - 7.0 -  Total Bilirubin 0.0 - 1.2 mg/dL - 0.3 -  Alkaline Phos 39 - 117 IU/L - 92 -  AST 0 - 40 IU/L - 33 -  ALT 0 - 44 IU/L - 35 -    Lipid Panel     Component Value Date/Time   CHOL 251 (H) 01/18/2019 0934   TRIG 183 (H) 01/18/2019 0934   HDL  45 01/18/2019 0934   CHOLHDL 5.6 (H) 01/18/2019 0934   CHOLHDL 3.5 06/07/2015 1005   VLDL 33 (H) 06/07/2015 1005   LDLCALC 172 (H) 01/18/2019 0934    CBC    Component Value Date/Time   WBC 7.4 12/03/2015 1704   RBC 4.17 (L) 12/03/2015 1704   HGB 12.6 (L) 12/03/2015 1704   HCT 38.9 12/03/2015 1704   PLT 288 12/03/2015 1704   MCV 93.3 12/03/2015 1704   MCH 30.2 12/03/2015 1704   MCHC 32.4 12/03/2015 1704   RDW 13.1 12/03/2015 1704   LYMPHSABS 2,294 12/03/2015 1704   MONOABS 518 12/03/2015 1704   EOSABS 148 12/03/2015 1704   BASOSABS 74 12/03/2015 1704    Lab Results  Component Value Date   HGBA1C 9.0 (A) 04/19/2019    Assessment & Plan:   1. Type 2 diabetes mellitus with diabetic polyneuropathy, with long-term current use of insulin (HCC) Uncontrolled with A1c of 9.0 We will send of A1c and adjust regimen accordingly Counseled on Diabetic diet, my plate method, 536 minutes of moderate intensity exercise/week Blood sugar logs with fasting goals of 80-120 mg/dl, random of less than 180 and in the event of sugars less than 60 mg/dl or greater than 400 mg/dl encouraged to notify the clinic. Advised on the need for annual eye exams, annual foot exams, Pneumonia vaccine. - POCT glucose (manual entry) - Hemoglobin A1c - gabapentin (NEURONTIN) 300 MG capsule; Take 1 capsule (300 mg total) by mouth 3 (three) times daily.  Dispense: 90 capsule; Refill: 6 - glipiZIDE (GLUCOTROL) 10 MG tablet; Take 1 tablet (10 mg total) by mouth 2 (two) times daily before a meal.  Dispense: 60 tablet; Refill: 6 - liraglutide (VICTOZA) 18 MG/3ML SOPN; Inject 0.3 mLs (1.8 mg total) into the skin daily.  Dispense: 30  mL; Refill: 6 - metFORMIN (GLUCOPHAGE) 1000 MG tablet; Take 1 tablet (1,000 mg total) by mouth 2 (two) times daily with a meal.  Dispense: 60 tablet; Refill: 6 - CMP14+EGFR - Lipid panel  2. Essential hypertension Controlled Diuretic use could explain his urinary frequency  Counseled on blood pressure goal of less than 130/80, low-sodium, DASH diet, medication compliance, 150 minutes of moderate intensity exercise per week. Discussed medication compliance, adverse effects. - hydrochlorothiazide (HYDRODIURIL) 25 MG tablet; Take 1 tablet (25 mg total) by mouth daily.  Dispense: 30 tablet; Refill: 6  3. Lower urinary tract symptoms He does have some frequency which could be as a result of chronic diuretic use Advised to continue Flomax UA is positive for UTI and treated with antibiotic - tamsulosin (FLOMAX) 0.4 MG CAPS capsule; Take 1 capsule (0.4 mg total) by mouth daily.  Dispense: 30 capsule; Refill: 6 - cephALEXin (KEFLEX) 500 MG capsule; Take 1 capsule (500 mg total) by mouth 2 (two) times daily.  Dispense: 14 capsule; Refill: 0 - POCT URINALYSIS DIP (CLINITEK)  4. Statin myopathy Unable to tolerate atorvastatin but he is able to tolerate a low dose of pravastatin twice a week  5. Acute cystitis without hematuria Treated - cephALEXin (KEFLEX) 500 MG capsule; Take 1 capsule (500 mg total) by mouth 2 (two) times daily.  Dispense: 14 capsule; Refill: 0  6. Pure hypercholesterolemia Uncontrolled Unable to tolerate Lipitor due to statin myopathy He is able to tolerate pravastatin twice a week - pravastatin (PRAVACHOL) 20 MG tablet; Take 0.5 tablets (10 mg total) by mouth daily.  Dispense: 90 tablet; Refill: 1     Charlott Rakes, MD, FAAFP. [place]  [facility] [place], [place]   07/12/2019, 8:58 AM

## 2019-07-13 LAB — CMP14+EGFR
ALT: 36 IU/L (ref 0–44)
AST: 47 IU/L — ABNORMAL HIGH (ref 0–40)
Albumin/Globulin Ratio: 1.2 (ref 1.2–2.2)
Albumin: 3.8 g/dL (ref 3.8–4.9)
Alkaline Phosphatase: 93 IU/L (ref 39–117)
BUN/Creatinine Ratio: 23 — ABNORMAL HIGH (ref 9–20)
BUN: 22 mg/dL (ref 6–24)
Bilirubin Total: 0.4 mg/dL (ref 0.0–1.2)
CO2: 28 mmol/L (ref 20–29)
Calcium: 8.9 mg/dL (ref 8.7–10.2)
Chloride: 104 mmol/L (ref 96–106)
Creatinine, Ser: 0.97 mg/dL (ref 0.76–1.27)
GFR calc Af Amer: 100 mL/min/{1.73_m2} (ref >59)
GFR calc non Af Amer: 87 mL/min/{1.73_m2} (ref >59)
Globulin, Total: 3.1 g/dL (ref 1.5–4.5)
Glucose: 174 mg/dL — ABNORMAL HIGH (ref 65–99)
Potassium: 5.1 mmol/L (ref 3.5–5.2)
Sodium: 145 mmol/L — ABNORMAL HIGH (ref 134–144)
Total Protein: 6.9 g/dL (ref 6.0–8.5)

## 2019-07-13 LAB — LIPID PANEL
Chol/HDL Ratio: 5 ratio (ref 0.0–5.0)
Cholesterol, Total: 224 mg/dL — ABNORMAL HIGH (ref 100–199)
HDL: 45 mg/dL (ref >39)
LDL Chol Calc (NIH): 153 mg/dL — ABNORMAL HIGH (ref 0–99)
Triglycerides: 144 mg/dL (ref 0–149)
VLDL Cholesterol Cal: 26 mg/dL (ref 5–40)

## 2019-07-13 LAB — HEMOGLOBIN A1C
Est. average glucose Bld gHb Est-mCnc: 252 mg/dL
Hgb A1c MFr Bld: 10.4 % — ABNORMAL HIGH (ref 4.8–5.6)

## 2019-07-14 ENCOUNTER — Other Ambulatory Visit: Payer: Self-pay | Admitting: Family Medicine

## 2019-07-14 DIAGNOSIS — Z794 Long term (current) use of insulin: Secondary | ICD-10-CM

## 2019-07-14 MED ORDER — INSULIN DETEMIR 100 UNIT/ML ~~LOC~~ SOLN: 30.0000 [IU] | Freq: Two times a day (BID) | SUBCUTANEOUS | 6 refills | Status: DC

## 2019-07-15 ENCOUNTER — Other Ambulatory Visit: Payer: Self-pay | Admitting: Family Medicine

## 2019-07-15 ENCOUNTER — Telehealth: Payer: Self-pay

## 2019-07-15 DIAGNOSIS — E1142 Type 2 diabetes mellitus with diabetic polyneuropathy: Secondary | ICD-10-CM

## 2019-07-15 NOTE — Telephone Encounter (Signed)
-----   Message from Hoy Register, MD sent at 07/14/2019  1:31 PM EDT ----- A1c is 10.4; goal is less than 7.  I have increased his Levemir to 30 units twice daily.  Cholesterol is elevated but due to his inability to tolerate regular statin dose he can continue current dose of pravastatin.

## 2019-07-15 NOTE — Telephone Encounter (Signed)
Patient name and DOB has been verified Patient was informed of lab results. Patient had no questions.  

## 2019-07-19 ENCOUNTER — Ambulatory Visit: Payer: Self-pay | Admitting: Family Medicine

## 2019-07-20 ENCOUNTER — Other Ambulatory Visit: Payer: Self-pay | Admitting: Family Medicine

## 2019-07-20 DIAGNOSIS — R399 Unspecified symptoms and signs involving the genitourinary system: Secondary | ICD-10-CM

## 2019-07-20 DIAGNOSIS — N3000 Acute cystitis without hematuria: Secondary | ICD-10-CM

## 2019-07-20 MED FILL — metFORMIN HCL 1000 MG TABS: 1000 | 30 days supply | Qty: 60 | Fill #2

## 2019-08-02 MED FILL — ?GLIPIZIDE 10 MG TABLET: 10 | 30 days supply | Qty: 60 | Fill #2

## 2019-08-02 MED FILL — HYDROCHLOROTHIAZIDE 25 MG T: 25 | 30 days supply | Qty: 30 | Fill #1

## 2019-08-02 MED FILL — TRUEPLUS SYR 0.5ML 30GX5/16: 30G X 5/16" | 25 days supply | Qty: 100 | Fill #1

## 2019-08-15 ENCOUNTER — Other Ambulatory Visit: Payer: Self-pay | Admitting: Family Medicine

## 2019-08-15 MED FILL — TAMSULOSIN HCL 0.4 MG CAP: 0.4 | 30 days supply | Qty: 30 | Fill #1

## 2019-08-15 MED FILL — MELOXICAM 15 MG TABLET: 15 | 30 days supply | Qty: 30 | Fill #0

## 2019-08-22 MED FILL — OMEGA-3 ETHYL ESTERS 1 GM C: 1 | 30 days supply | Qty: 60 | Fill #2

## 2019-08-22 MED FILL — $LEVEMIR 100U/ML VIAL: 100 | 33 days supply | Qty: 20 | Fill #0

## 2019-08-25 MED FILL — !LEVEMIR 100 UNITS/ML VIAL: 100/ML | 20 days supply | Qty: 10 | Fill #2

## 2019-08-26 MED FILL — metFORMIN HCL 1000 MG TABS: 1000 | 30 days supply | Qty: 60 | Fill #3

## 2019-08-31 MED FILL — $VICTOZA 2-PAK 18MG/3ML PEN: 18 | 30 days supply | Qty: 9 | Fill #3

## 2019-09-01 MED FILL — ?GLIPIZIDE 10 MG TABLET: 10 | 30 days supply | Qty: 60 | Fill #0

## 2019-09-06 MED FILL — GABAPENTIN 300 MG CAPSULE: 300 | 30 days supply | Qty: 90 | Fill #5

## 2019-09-12 MED FILL — HYDROCHLOROTHIAZIDE 25 MG T: 25 | 30 days supply | Qty: 30 | Fill #2

## 2019-09-12 MED FILL — MELOXICAM 15 MG TABLET: 15 | 30 days supply | Qty: 30 | Fill #1

## 2019-09-12 MED FILL — !LEVEMIR 100 UNITS/ML VIAL: 100/ML | 20 days supply | Qty: 10 | Fill #3

## 2019-09-12 MED FILL — ?PRAVASTATIN NA 20MG TABL: 20 | 30 days supply | Qty: 15 | Fill #1

## 2019-09-16 ENCOUNTER — Other Ambulatory Visit: Payer: Self-pay | Admitting: Family Medicine

## 2019-09-16 DIAGNOSIS — E785 Hyperlipidemia, unspecified: Secondary | ICD-10-CM

## 2019-09-16 MED FILL — TAMSULOSIN HCL 0.4 MG CAP: 0.4 | 30 days supply | Qty: 30 | Fill #2

## 2019-09-16 MED FILL — OMEGA-3 ETHYL ESTERS 1 GM C: 1 | 30 days supply | Qty: 60 | Fill #0

## 2019-09-16 NOTE — Telephone Encounter (Signed)
Requested Prescriptions  Pending Prescriptions Disp Refills  . omega-3 acid ethyl esters (LOVAZA) 1 g capsule [Pharmacy Med Name: OMEGA-3 ETHYL ESTERS 1 GM C 1 Capsule] 180 capsule 2    Sig: TAKE 1 CAPSULE BY MOUTH 2 TIMES DAILY.     Endocrinology:  Nutritional Agents Passed - 09/16/2019  8:56 AM      Passed - Valid encounter within last 12 months    Recent Outpatient Visits          2 months ago Type 2 diabetes mellitus with diabetic polyneuropathy, with long-term current use of insulin (HCC)   [facility] [place], [place], MD   5 months ago Type 2 diabetes mellitus with diabetic polyneuropathy, with long-term current use of insulin (HCC)   [facility] [place], [place], MD   8 months ago Type 2 diabetes mellitus with diabetic polyneuropathy, with long-term current use of insulin (HCC)   [facility] [place], [place], MD   11 months ago Type 2 diabetes mellitus with diabetic polyneuropathy, with long-term current use of insulin (HCC)   [facility] [place], [place], MD   1 year ago Type 2 diabetes mellitus with diabetic polyneuropathy, with long-term current use of insulin (HCC)   [facility] Hoy Register, MD      Future Appointments            In 2 months Hoy Register, MD [facility]

## 2019-09-26 MED FILL — metFORMIN HCL 1000 MG TABS: 1000 | 30 days supply | Qty: 60 | Fill #4

## 2019-10-03 MED FILL — !LEVEMIR 100 UNITS/ML VIAL: 100/ML | 20 days supply | Qty: 10 | Fill #4

## 2019-10-03 MED FILL — ?GLIPIZIDE 10 MG TABLET: 10 | 30 days supply | Qty: 60 | Fill #1

## 2019-10-03 MED FILL — !VICTOZA 18MG/3ML INJECT: 18 | 30 days supply | Qty: 9 | Fill #4

## 2019-10-14 ENCOUNTER — Other Ambulatory Visit: Payer: Self-pay | Admitting: Family Medicine

## 2019-10-14 DIAGNOSIS — I10 Essential (primary) hypertension: Secondary | ICD-10-CM

## 2019-10-14 MED FILL — ?PRAVASTATIN NA 20 MG TAB: 20 | 30 days supply | Qty: 15 | Fill #2

## 2019-10-14 MED FILL — MELOXICAM 15 MG TABLET: 15 | 30 days supply | Qty: 30 | Fill #0

## 2019-10-14 MED FILL — TAMSULOSIN HCL 0.4 MG CAP: 0.4 | 30 days supply | Qty: 30 | Fill #3

## 2019-10-14 MED FILL — HYDROCHLOROTHIAZIDE 25 MG T: 25 | 30 days supply | Qty: 30 | Fill #0

## 2019-10-14 MED FILL — GABAPENTIN 300 MG CAPSULE: 300 | 30 days supply | Qty: 90 | Fill #6

## 2019-10-19 MED FILL — OMEGA-3 ETHYL ESTERS 1 GM C: 1 | 30 days supply | Qty: 60 | Fill #1

## 2019-10-20 MED FILL — $LEVEMIR 100U/ML VIAL: 100 | 80 days supply | Qty: 40 | Fill #5

## 2019-10-26 MED FILL — $VICTOZA 2-PAK 18MG/3ML PEN: 18 | 30 days supply | Qty: 9 | Fill #0

## 2019-10-26 MED FILL — metFORMIN HCL 1000 MG TABS: 1000 | 30 days supply | Qty: 60 | Fill #5

## 2019-11-08 MED FILL — ?GLIPIZIDE 10 MG TABLET: 10 | 30 days supply | Qty: 60 | Fill #2

## 2019-11-10 MED FILL — MELOXICAM 15 MG TABLET: 15 | 30 days supply | Qty: 30 | Fill #1

## 2019-11-15 ENCOUNTER — Encounter: Payer: Self-pay | Admitting: Family Medicine

## 2019-11-15 ENCOUNTER — Other Ambulatory Visit: Payer: Self-pay

## 2019-11-15 ENCOUNTER — Ambulatory Visit: Payer: Self-pay | Attending: Family Medicine | Admitting: Family Medicine

## 2019-11-15 ENCOUNTER — Other Ambulatory Visit: Payer: Self-pay | Admitting: Family Medicine

## 2019-11-15 VITALS — BP 127/81 | HR 74 | Ht 76.0 in | Wt 369.0 lb

## 2019-11-15 DIAGNOSIS — I10 Essential (primary) hypertension: Secondary | ICD-10-CM

## 2019-11-15 DIAGNOSIS — Z23 Encounter for immunization: Secondary | ICD-10-CM

## 2019-11-15 DIAGNOSIS — E1142 Type 2 diabetes mellitus with diabetic polyneuropathy: Secondary | ICD-10-CM

## 2019-11-15 DIAGNOSIS — R399 Unspecified symptoms and signs involving the genitourinary system: Secondary | ICD-10-CM

## 2019-11-15 DIAGNOSIS — E7800 Pure hypercholesterolemia, unspecified: Secondary | ICD-10-CM

## 2019-11-15 DIAGNOSIS — Z794 Long term (current) use of insulin: Secondary | ICD-10-CM

## 2019-11-15 DIAGNOSIS — G720 Drug-induced myopathy: Secondary | ICD-10-CM

## 2019-11-15 DIAGNOSIS — T466X5A Adverse effect of antihyperlipidemic and antiarteriosclerotic drugs, initial encounter: Secondary | ICD-10-CM

## 2019-11-15 LAB — POCT GLYCOSYLATED HEMOGLOBIN (HGB A1C): HbA1c, POC (controlled diabetic range): 9.6 % — AB (ref 0.0–7.0)

## 2019-11-15 LAB — GLUCOSE, POCT (MANUAL RESULT ENTRY): POC Glucose: 176 mg/dl — AB (ref 70–99)

## 2019-11-15 MED ORDER — TAMSULOSIN HCL 0.4 MG PO CAPS: 0.4000 mg | ORAL_CAPSULE | Freq: Every day | ORAL | 6 refills | Status: DC

## 2019-11-15 MED ORDER — EZETIMIBE 10 MG PO TABS: 10.0000 mg | ORAL_TABLET | Freq: Every day | ORAL | 1 refills | Status: DC

## 2019-11-15 MED ORDER — METFORMIN HCL 1000 MG PO TABS: 1000.0000 mg | ORAL_TABLET | Freq: Two times a day (BID) | ORAL | 6 refills | Status: DC

## 2019-11-15 MED ORDER — GABAPENTIN 300 MG PO CAPS: 300.0000 mg | ORAL_CAPSULE | Freq: Three times a day (TID) | ORAL | 6 refills | Status: DC

## 2019-11-15 MED ORDER — PRAVASTATIN SODIUM 20 MG PO TABS: 10.0000 mg | ORAL_TABLET | Freq: Every day | ORAL | 1 refills | Status: DC

## 2019-11-15 MED ORDER — VICTOZA 18 MG/3ML ~~LOC~~ SOPN: 1.8000 mg | PEN_INJECTOR | Freq: Every day | SUBCUTANEOUS | 6 refills | Status: DC

## 2019-11-15 MED ORDER — GLIPIZIDE 10 MG PO TABS: 10.0000 mg | ORAL_TABLET | Freq: Two times a day (BID) | ORAL | 6 refills | Status: DC

## 2019-11-15 MED ORDER — INSULIN DETEMIR 100 UNIT/ML ~~LOC~~ SOLN: 35.0000 [IU] | Freq: Two times a day (BID) | SUBCUTANEOUS | 6 refills | Status: DC

## 2019-11-15 MED ORDER — AMLODIPINE BESYLATE 5 MG PO TABS: 5.0000 mg | ORAL_TABLET | Freq: Every day | ORAL | 3 refills | Status: DC

## 2019-11-15 MED FILL — TAMSULOSIN HCL 0.4 MG CAP: 0.4 | 30 days supply | Qty: 30 | Fill #0

## 2019-11-15 MED FILL — AMLODIPINE BESYLATE 5 MG TA: 5 | 30 days supply | Qty: 30 | Fill #0

## 2019-11-15 MED FILL — EZETIMIBE 10 MG TABS: 10 | 90 days supply | Qty: 90 | Fill #0

## 2019-11-15 MED FILL — TRUE METRIX TEST STRIP: 25 days supply | Qty: 100 | Fill #3

## 2019-11-15 MED FILL — PRAVASTATIN SODIUM 20 MG TA: 20 | 30 days supply | Qty: 15 | Fill #0

## 2019-11-15 MED FILL — GABAPENTIN 300 MG CAPSULE: 300 | 30 days supply | Qty: 90 | Fill #0

## 2019-11-15 MED FILL — $VICTOZA 2-PAK 18MG/3ML PEN: 18 | 90 days supply | Qty: 27 | Fill #0

## 2019-11-15 NOTE — Patient Instructions (Signed)

## 2019-11-15 NOTE — Progress Notes (Signed)
States that the pravastatin is causing cramps in high thighs.  Wants to discus HCTZ medication.

## 2019-11-15 NOTE — Progress Notes (Signed)
Subjective:  Patient ID: Dillon Mcgee, male    DOB: [birth year]  Age: 59 y.o. MRN: [id number]  CC: Diabetes   HPI Dillon Mcgee is a 59 year old male with a history of hypertension, type 2 diabetes mellitus (A1c 9.6), hyperlipidemia, obesity here for follow-up visit He complains of cramping with pravastatin and thighs feel "as tight as a drum".  Previously unable to tolerate atorvastatin which was switched to pravastatin and finally dose had to be decreased to 10 mg daily.   Fasting sugars are 117-247, has had a 326 on one occasion and he endorses not being fully compliant with a diabetic diet or low-cholesterol diet.  He had his last eye exam in 07/2019- no evidence of diabetic neuropathy.  Denies visual concerns, hypoglycemic symptoms or neuropathy.  Outside of work he does not exercise but he informs me he does a lot of heavy lifting at work and he has lost 6 pounds in the last 3 months He complains of excessive urination with hydrochlorothiazide this affects him off work and he would like to try something else.  Past Medical History:  Diagnosis Date  . Diabetes mellitus without complication ([place])   . Hyperlipidemia   . Hypertension     Past Surgical History:  Procedure Laterality Date  . COLONOSCOPY     5 years ago     Family History  Problem Relation Age of Onset  . Heart disease Maternal Aunt   . Diabetes Maternal Uncle     Allergies  Allergen Reactions  . Pioglitazone Swelling    Lower extremity swelling.    Outpatient Medications Prior to Visit  Medication Sig Dispense Refill  . aspirin EC 81 MG tablet Take 1 tablet (81 mg total) daily by mouth. 90 tablet 3  . Blood Glucose Monitoring Suppl (TRUE METRIX GO GLUCOSE METER) w/Device KIT 1 each by Does not apply route every 8 (eight) hours as needed. 1 kit 0  . ferrous sulfate 325 (65 FE) MG tablet Take 325 mg by mouth daily with breakfast.    . glucose blood (TRUE METRIX BLOOD GLUCOSE TEST) test strip Use as  instructed 100 each 12  . meloxicam (MOBIC) 15 MG tablet TAKE 1 TABLET (15 MG TOTAL) BY MOUTH DAILY. TAKE WITH FOOD 30 tablet 2  . omega-3 acid ethyl esters (LOVAZA) 1 g capsule TAKE 1 CAPSULE BY MOUTH 2 TIMES DAILY. 180 capsule 2  . TRUEPLUS INSULIN SYRINGE 30G X 5/16" 0.5 ML MISC USE AS DIRECTED 4 TIMES DAILY 100 each 3  . TRUEPLUS LANCETS 26G MISC 1 each by Does not apply route every 8 (eight) hours as needed. 100 each 12  . TRUEPLUS PEN NEEDLES 32G X 4 MM MISC USE AS DIRECTED ONCE DAILY 100 each 6  . gabapentin (NEURONTIN) 300 MG capsule Take 1 capsule (300 mg total) by mouth 3 (three) times daily. 90 capsule 6  . glipiZIDE (GLUCOTROL) 10 MG tablet Take 1 tablet (10 mg total) by mouth 2 (two) times daily before a meal. 60 tablet 6  . hydrochlorothiazide (HYDRODIURIL) 25 MG tablet Take 1 tablet (25 mg total) by mouth daily. 30 tablet 6  . insulin detemir (LEVEMIR) 100 UNIT/ML injection Inject 0.3 mLs (30 Units total) into the skin 2 (two) times daily. 30 mL 6  . liraglutide (VICTOZA) 18 MG/3ML SOPN Inject 0.3 mLs (1.8 mg total) into the skin daily. 30 mL 6  . metFORMIN (GLUCOPHAGE) 1000 MG tablet Take 1 tablet (1,000 mg total) by mouth 2 (two) times daily  with a meal. 60 tablet 6  . methocarbamol (ROBAXIN) 500 MG tablet Take 1 tablet (500 mg total) by mouth every 8 (eight) hours as needed for muscle spasms. 90 tablet 1  . pravastatin (PRAVACHOL) 20 MG tablet Take 0.5 tablets (10 mg total) by mouth daily. 90 tablet 1  . tamsulosin (FLOMAX) 0.4 MG CAPS capsule Take 1 capsule (0.4 mg total) by mouth daily. 30 capsule 6  . triamcinolone cream (KENALOG) 0.1 % Apply 1 application topically 2 (two) times daily. 30 g 2  . acetaminophen (TYLENOL) 500 MG tablet Take 2 tablets (1,000 mg total) by mouth every 6 (six) hours as needed for moderate pain. (Patient not taking: Reported on 07/15/2018) 30 tablet 0  . cephALEXin (KEFLEX) 500 MG capsule Take 1 capsule (500 mg total) by mouth 2 (two) times daily.  (Patient not taking: Reported on 11/15/2019) 14 capsule 0   No facility-administered medications prior to visit.     ROS Review of Systems  Constitutional: Negative for activity change and appetite change.  HENT: Negative for sinus pressure and sore throat.   Eyes: Negative for visual disturbance.  Respiratory: Negative for cough, chest tightness and shortness of breath.   Cardiovascular: Negative for chest pain and leg swelling.  Gastrointestinal: Negative for abdominal distention, abdominal pain, constipation and diarrhea.  Endocrine: Negative.   Genitourinary: Negative for dysuria.  Musculoskeletal: Positive for myalgias. Negative for joint swelling.  Skin: Negative for rash.  Allergic/Immunologic: Negative.   Neurological: Negative for weakness, light-headedness and numbness.  Psychiatric/Behavioral: Negative for dysphoric mood and suicidal ideas.    Objective:  BP 127/81   Pulse 74   Ht '6\' 4"'  (1.93 m)   Wt (!) 369 lb (167.4 kg)   SpO2 96%   BMI 44.92 kg/m   BP/Weight 11/15/2019 07/12/2019 05/22/2246  Systolic BP 250 037 048  Diastolic BP 81 81 74  Wt. (Lbs) 369 373 370  BMI 44.92 45.4 45.04      Physical Exam Constitutional:      Appearance: He is well-developed.  Neck:     Vascular: No JVD.  Cardiovascular:     Rate and Rhythm: Normal rate.     Heart sounds: Normal heart sounds. No murmur heard.   Pulmonary:     Effort: Pulmonary effort is normal.     Breath sounds: Normal breath sounds. No wheezing or rales.  Chest:     Chest wall: No tenderness.  Abdominal:     General: Bowel sounds are normal. There is no distension.     Palpations: Abdomen is soft. There is no mass.     Tenderness: There is no abdominal tenderness.  Musculoskeletal:        General: Normal range of motion.     Right lower leg: No edema.     Left lower leg: No edema.  Neurological:     Mental Status: He is alert and oriented to person, place, and time.  Psychiatric:        Mood  and Affect: Mood normal.     CMP Latest Ref Rng & Units 07/12/2019 04/19/2019 01/18/2019  Glucose 65 - 99 mg/dL 174(H) 127(H) 124(H)  BUN 6 - 24 mg/dL '22 19 22  ' Creatinine 0.76 - 1.27 mg/dL 0.97 1.22 1.25  Sodium 134 - 144 mmol/L 145(H) 142 141  Potassium 3.5 - 5.2 mmol/L 5.1 5.1 5.6(H)  Chloride 96 - 106 mmol/L 104 104 105  CO2 20 - 29 mmol/L '28 24 25  ' Calcium 8.7 - 10.2  mg/dL 8.9 9.6 9.5  Total Protein 6.0 - 8.5 g/dL 6.9 - 7.0  Total Bilirubin 0.0 - 1.2 mg/dL 0.4 - 0.3  Alkaline Phos 39 - 117 IU/L 93 - 92  AST 0 - 40 IU/L 47(H) - 33  ALT 0 - 44 IU/L 36 - 35    Lipid Panel     Component Value Date/Time   CHOL 224 (H) 07/12/2019 0930   TRIG 144 07/12/2019 0930   HDL 45 07/12/2019 0930   CHOLHDL 5.0 07/12/2019 0930   CHOLHDL 3.5 06/07/2015 1005   VLDL 33 (H) 06/07/2015 1005   LDLCALC 153 (H) 07/12/2019 0930    CBC    Component Value Date/Time   WBC 7.4 12/03/2015 1704   RBC 4.17 (L) 12/03/2015 1704   HGB 12.6 (L) 12/03/2015 1704   HCT 38.9 12/03/2015 1704   PLT 288 12/03/2015 1704   MCV 93.3 12/03/2015 1704   MCH 30.2 12/03/2015 1704   MCHC 32.4 12/03/2015 1704   RDW 13.1 12/03/2015 1704   LYMPHSABS 2,294 12/03/2015 1704   MONOABS 518 12/03/2015 1704   EOSABS 148 12/03/2015 1704   BASOSABS 74 12/03/2015 1704    Lab Results  Component Value Date   HGBA1C 9.6 (A) 11/15/2019    Assessment & Plan:  1. Type 2 diabetes mellitus with diabetic polyneuropathy, with long-term current use of insulin (HCC) Uncontrolled with A1c of 9.6; goal is less than 7.0 Increase Levemir from 30 units twice daily to 35 units twice daily Continue all medications Counseled on Diabetic diet, my plate method, 211 minutes of moderate intensity exercise/week Blood sugar logs with fasting goals of 80-120 mg/dl, random of less than 180 and in the event of sugars less than 60 mg/dl or greater than 400 mg/dl encouraged to notify the clinic. Advised on the need for annual eye exams, annual foot  exams, Pneumonia vaccine. - POCT glucose (manual entry) - POCT glycosylated hemoglobin (Hb A1C) - Microalbumin / creatinine urine ratio - insulin detemir (LEVEMIR) 100 UNIT/ML injection; Inject 0.35 mLs (35 Units total) into the skin 2 (two) times daily.  Dispense: 30 mL; Refill: 6 - CMP14+EGFR - Lipid panel - gabapentin (NEURONTIN) 300 MG capsule; Take 1 capsule (300 mg total) by mouth 3 (three) times daily.  Dispense: 90 capsule; Refill: 6 - glipiZIDE (GLUCOTROL) 10 MG tablet; Take 1 tablet (10 mg total) by mouth 2 (two) times daily before a meal.  Dispense: 60 tablet; Refill: 6 - liraglutide (VICTOZA) 18 MG/3ML SOPN; Inject 1.8 mg into the skin daily.  Dispense: 30 mL; Refill: 6 - metFORMIN (GLUCOPHAGE) 1000 MG tablet; Take 1 tablet (1,000 mg total) by mouth 2 (two) times daily with a meal.  Dispense: 60 tablet; Refill: 6  2. Statin myopathy Unable to tolerate moderate intensity statin Advised to administer pravastatin twice a week if able to tolerate We will add Zetia to her regimen  3. Pure hypercholesterolemia Uncontrolled due to statin intolerance See #1 above Low-cholesterol diet - ezetimibe (ZETIA) 10 MG tablet; Take 1 tablet (10 mg total) by mouth daily.  Dispense: 90 tablet; Refill: 1 - pravastatin (PRAVACHOL) 20 MG tablet; Take 0.5 tablets (10 mg total) by mouth daily.  Dispense: 30 tablet; Refill: 1  4. Lower urinary tract symptoms Stable - tamsulosin (FLOMAX) 0.4 MG CAPS capsule; Take 1 capsule (0.4 mg total) by mouth daily.  Dispense: 30 capsule; Refill: 6  5. Essential hypertension Controlled but due to urinary frequency with diuretic I have switched to amlodipine Counseled on blood  pressure goal of less than 130/80, low-sodium, DASH diet, medication compliance, 150 minutes of moderate intensity exercise per week. Discussed medication compliance, adverse effects. - amLODipine (NORVASC) 5 MG tablet; Take 1 tablet (5 mg total) by mouth daily.  Dispense: 90 tablet;  Refill: 3    Meds ordered this encounter  Medications  . ezetimibe (ZETIA) 10 MG tablet    Sig: Take 1 tablet (10 mg total) by mouth daily.    Dispense:  90 tablet    Refill:  1  . insulin detemir (LEVEMIR) 100 UNIT/ML injection    Sig: Inject 0.35 mLs (35 Units total) into the skin 2 (two) times daily.    Dispense:  30 mL    Refill:  6    Please mail  . pravastatin (PRAVACHOL) 20 MG tablet    Sig: Take 0.5 tablets (10 mg total) by mouth daily.    Dispense:  30 tablet    Refill:  1  . gabapentin (NEURONTIN) 300 MG capsule    Sig: Take 1 capsule (300 mg total) by mouth 3 (three) times daily.    Dispense:  90 capsule    Refill:  6  . glipiZIDE (GLUCOTROL) 10 MG tablet    Sig: Take 1 tablet (10 mg total) by mouth 2 (two) times daily before a meal.    Dispense:  60 tablet    Refill:  6  . amLODipine (NORVASC) 5 MG tablet    Sig: Take 1 tablet (5 mg total) by mouth daily.    Dispense:  90 tablet    Refill:  3  . liraglutide (VICTOZA) 18 MG/3ML SOPN    Sig: Inject 1.8 mg into the skin daily.    Dispense:  30 mL    Refill:  6  . metFORMIN (GLUCOPHAGE) 1000 MG tablet    Sig: Take 1 tablet (1,000 mg total) by mouth 2 (two) times daily with a meal.    Dispense:  60 tablet    Refill:  6  . tamsulosin (FLOMAX) 0.4 MG CAPS capsule    Sig: Take 1 capsule (0.4 mg total) by mouth daily.    Dispense:  30 capsule    Refill:  6    Follow-up: Return in about 3 months (around 02/14/2020) for Chronic disease management.       Charlott Rakes, MD, FAAFP. [facility] [place], [place]   11/15/2019, 9:07 AM

## 2019-11-16 LAB — CMP14+EGFR
ALT: 28 IU/L (ref 0–44)
AST: 39 IU/L (ref 0–40)
Albumin/Globulin Ratio: 1.5 (ref 1.2–2.2)
Albumin: 4.3 g/dL (ref 3.8–4.9)
Alkaline Phosphatase: 98 IU/L (ref 44–121)
BUN/Creatinine Ratio: 20 (ref 9–20)
BUN: 21 mg/dL (ref 6–24)
Bilirubin Total: 0.3 mg/dL (ref 0.0–1.2)
CO2: 27 mmol/L (ref 20–29)
Calcium: 9.8 mg/dL (ref 8.7–10.2)
Chloride: 102 mmol/L (ref 96–106)
Creatinine, Ser: 1.05 mg/dL (ref 0.76–1.27)
GFR calc Af Amer: 91 mL/min/{1.73_m2} (ref >59)
GFR calc non Af Amer: 78 mL/min/{1.73_m2} (ref >59)
Globulin, Total: 2.8 g/dL (ref 1.5–4.5)
Glucose: 174 mg/dL — ABNORMAL HIGH (ref 65–99)
Potassium: 5.5 mmol/L — ABNORMAL HIGH (ref 3.5–5.2)
Sodium: 141 mmol/L (ref 134–144)
Total Protein: 7.1 g/dL (ref 6.0–8.5)

## 2019-11-16 LAB — MICROALBUMIN / CREATININE URINE RATIO
Creatinine, Urine: 95.3 mg/dL
Microalb/Creat Ratio: 45 mg/g creat — ABNORMAL HIGH (ref 0–29)
Microalbumin, Urine: 42.8 ug/mL

## 2019-11-16 LAB — LIPID PANEL
Chol/HDL Ratio: 5.1 ratio — ABNORMAL HIGH (ref 0.0–5.0)
Cholesterol, Total: 238 mg/dL — ABNORMAL HIGH (ref 100–199)
HDL: 47 mg/dL (ref >39)
LDL Chol Calc (NIH): 166 mg/dL — ABNORMAL HIGH (ref 0–99)
Triglycerides: 136 mg/dL (ref 0–149)
VLDL Cholesterol Cal: 25 mg/dL (ref 5–40)

## 2019-11-18 ENCOUNTER — Telehealth: Payer: Self-pay

## 2019-11-18 ENCOUNTER — Other Ambulatory Visit: Payer: Self-pay | Admitting: Family Medicine

## 2019-11-18 DIAGNOSIS — E875 Hyperkalemia: Secondary | ICD-10-CM

## 2019-11-18 MED ORDER — SODIUM POLYSTYRENE SULFONATE 15 GM/60ML PO SUSP: 30.0000 g | Freq: Once | ORAL | 0 refills | Status: AC

## 2019-11-18 MED FILL — SPS 15 GM/60 ML SUSPENSION: 15 | 1 days supply | Qty: 120 | Fill #0

## 2019-11-18 NOTE — Telephone Encounter (Signed)
-----   Message from Hoy Register, MD sent at 11/18/2019  9:24 AM EDT ----- Potassium is elevated, I have sent a rx for kayexalate to his pharmacy and referred him to Nephrology due to recurrent hyperkalemia.

## 2019-11-18 NOTE — Telephone Encounter (Signed)
Patient name and DOB has been verified Patient was informed of lab results. Patient had no questions.  

## 2019-11-21 MED FILL — TRUEPLUS SYR 0.5ML 30GX5/16: 30G X 5/16" | 25 days supply | Qty: 100 | Fill #2

## 2019-11-21 MED FILL — OMEGA-3 ETHYL ESTERS 1 GM C: 1 | 30 days supply | Qty: 60 | Fill #2

## 2019-11-24 ENCOUNTER — Telehealth: Payer: Self-pay | Admitting: Family Medicine

## 2019-11-24 NOTE — Telephone Encounter (Signed)
Patient called to ask the doctor to send a referral to a Kidney specialist.  He would like for it to go to [facility], [address]  CB# [phone]

## 2019-11-24 NOTE — Telephone Encounter (Signed)
Patient was mailed a letter from referral cord.Arna Medici) with options for kidney specialist.

## 2019-11-28 MED FILL — METFORMIN HCL 1000 MG TABS: 1000 | 30 days supply | Qty: 60 | Fill #6

## 2019-12-07 MED FILL — ?GLIPIZIDE 10 MG TABLET: 10 | 30 days supply | Qty: 60 | Fill #3

## 2019-12-07 MED FILL — MELOXICAM 15 MG TABLET: 15 | 30 days supply | Qty: 30 | Fill #2

## 2019-12-07 MED FILL — GABAPENTIN 300 MG CAPSULE: 300 | 30 days supply | Qty: 90 | Fill #1

## 2019-12-07 MED FILL — AMLODIPINE BESYLATE 5 MG TA: 5 | 30 days supply | Qty: 30 | Fill #1

## 2019-12-21 MED FILL — $LEVEMIR 100U/ML VIAL: 100 | 57 days supply | Qty: 40 | Fill #0

## 2019-12-23 MED FILL — OMEGA-3 ETHYL ESTERS 1 GM C: 1 | 30 days supply | Qty: 60 | Fill #3

## 2019-12-23 MED FILL — TAMSULOSIN HCL 0.4 MG CAP: 0.4 | 30 days supply | Qty: 30 | Fill #1

## 2019-12-26 MED FILL — METFORMIN HCL 1000 MG TABS: 1000 | 30 days supply | Qty: 60 | Fill #0

## 2020-01-09 MED FILL — glipiZIDE 10 MG TABS: 10 | 30 days supply | Qty: 60 | Fill #4

## 2020-01-11 ENCOUNTER — Other Ambulatory Visit: Payer: Self-pay | Admitting: Family Medicine

## 2020-01-11 MED FILL — AMLODIPINE BESYLATE 5 MG TA: 5 | 30 days supply | Qty: 30 | Fill #2

## 2020-01-11 MED FILL — MELOXICAM 15 MG TABLET: 15 | 30 days supply | Qty: 30 | Fill #0

## 2020-01-30 MED FILL — GABAPENTIN 300 MG CAPSULE: 300 | 30 days supply | Qty: 90 | Fill #2

## 2020-01-30 MED FILL — METFORMIN HCL 1000 MG TABS: 1000 | 30 days supply | Qty: 60 | Fill #1

## 2020-01-30 MED FILL — OMEGA-3 ETHYL ESTERS 1 GM C: 1 | 30 days supply | Qty: 60 | Fill #4

## 2020-01-30 MED FILL — TAMSULOSIN HCL 0.4 MG CAP: 0.4 | 30 days supply | Qty: 30 | Fill #2

## 2020-02-02 MED FILL — PRAVASTATIN SODIUM 20 MG TA: 20 | 30 days supply | Qty: 15 | Fill #1

## 2020-02-08 MED FILL — EZETIMIBE 10 MG TABS: 10 | 90 days supply | Qty: 90 | Fill #1

## 2020-02-10 MED FILL — $LEVEMIR 100U/ML VIAL: 100 | 85 days supply | Qty: 60 | Fill #1

## 2020-02-10 MED FILL — glipiZIDE 10 MG TABS: 10 | 30 days supply | Qty: 60 | Fill #5

## 2020-02-14 ENCOUNTER — Ambulatory Visit: Payer: Self-pay | Attending: Family Medicine | Admitting: Family Medicine

## 2020-02-14 ENCOUNTER — Other Ambulatory Visit: Payer: Self-pay | Admitting: Family Medicine

## 2020-02-14 ENCOUNTER — Encounter: Payer: Self-pay | Admitting: Family Medicine

## 2020-02-14 ENCOUNTER — Other Ambulatory Visit: Payer: Self-pay

## 2020-02-14 VITALS — BP 134/71 | HR 78 | Temp 98.3°F | Ht 76.0 in | Wt 371.0 lb

## 2020-02-14 DIAGNOSIS — E1169 Type 2 diabetes mellitus with other specified complication: Secondary | ICD-10-CM

## 2020-02-14 DIAGNOSIS — T466X5A Adverse effect of antihyperlipidemic and antiarteriosclerotic drugs, initial encounter: Secondary | ICD-10-CM

## 2020-02-14 DIAGNOSIS — E875 Hyperkalemia: Secondary | ICD-10-CM

## 2020-02-14 DIAGNOSIS — E1142 Type 2 diabetes mellitus with diabetic polyneuropathy: Secondary | ICD-10-CM

## 2020-02-14 DIAGNOSIS — I152 Hypertension secondary to endocrine disorders: Secondary | ICD-10-CM

## 2020-02-14 DIAGNOSIS — E1159 Type 2 diabetes mellitus with other circulatory complications: Secondary | ICD-10-CM

## 2020-02-14 DIAGNOSIS — E785 Hyperlipidemia, unspecified: Secondary | ICD-10-CM

## 2020-02-14 DIAGNOSIS — Z794 Long term (current) use of insulin: Secondary | ICD-10-CM

## 2020-02-14 DIAGNOSIS — G720 Drug-induced myopathy: Secondary | ICD-10-CM

## 2020-02-14 LAB — POCT GLYCOSYLATED HEMOGLOBIN (HGB A1C): HbA1c, POC (controlled diabetic range): 8.4 % — AB (ref 0.0–7.0)

## 2020-02-14 LAB — GLUCOSE, POCT (MANUAL RESULT ENTRY): POC Glucose: 134 mg/dl — AB (ref 70–99)

## 2020-02-14 MED ORDER — DAPAGLIFLOZIN PROPANEDIOL 5 MG PO TABS: 5.0000 mg | ORAL_TABLET | Freq: Every day | ORAL | 1 refills | Status: DC

## 2020-02-14 MED FILL — FARXIGA 5 MG TABLET: 5 | 30 days supply | Qty: 30 | Fill #0

## 2020-02-14 NOTE — Progress Notes (Signed)
Subjective:  Patient ID: Dillon Mcgee, male    DOB: 1960-04-04  Age: 59 y.o. MRN: [id number]  CC: Diabetes   HPI Dillon Mcgee  is a 59 year old male with a history of hypertension, type 2 diabetes mellitus (A1c8.4), hyperlipidemia, obesity here for follow-up visit. His urinary frequency has improved ever since HCTZ was substituted with Amlodipine but he has noticed some pedal edema.  His A1c is 8.4 which is down from 9.6 previously and his fasting blood sugars range between 98-179.  Endorses compliance with his medication regimen and denies hypoglycemic symptoms, numbness in extremities. Takes Pravastatin 14m twice a week due to statin myopathy and this has brought about significant improvement in symptoms.  He is also on Zetia. Denies additional concerns today. Past Medical History:  Diagnosis Date  . Diabetes mellitus [place] complication [place]   . Hyperlipidemia   . Hypertension     Past Surgical History:  Procedure Laterality Date  . COLONOSCOPY     5 years ago     Family History  Problem Relation Age of Onset  . Heart disease Maternal Aunt   . Diabetes Maternal Uncle     Allergies  Allergen Reactions  . Pioglitazone Swelling    Lower extremity swelling.    Outpatient Medications Prior to Visit  Medication Sig Dispense Refill  . amLODipine (NORVASC) 5 MG tablet Take 1 tablet (5 mg total) by mouth daily. 90 tablet 3  . aspirin EC 81 MG tablet Take 1 tablet (81 mg total) daily by mouth. 90 tablet 3  . Blood Glucose Monitoring Suppl (TRUE METRIX GO GLUCOSE METER) w/Device KIT 1 each by Does not apply route every 8 (eight) hours as needed. 1 kit 0  . ezetimibe (ZETIA) 10 MG tablet Take 1 tablet (10 mg total) by mouth daily. 90 tablet 1  . ferrous sulfate 325 (65 FE) MG tablet Take 325 mg by mouth daily with breakfast.    . gabapentin (NEURONTIN) 300 MG capsule Take 1 capsule (300 mg total) by mouth 3 (three) times daily. 90 capsule 6  . glipiZIDE (GLUCOTROL) 10  MG tablet Take 1 tablet (10 mg total) by mouth 2 (two) times daily before a meal. 60 tablet 6  . glucose blood (TRUE METRIX BLOOD GLUCOSE TEST) test strip Use as instructed 100 each 12  . insulin detemir (LEVEMIR) 100 UNIT/ML injection Inject 0.35 mLs (35 Units total) into the skin 2 (two) times daily. 30 mL 6  . liraglutide (VICTOZA) 18 MG/3ML SOPN Inject 1.8 mg into the skin daily. 30 mL 6  . meloxicam (MOBIC) 15 MG tablet TAKE 1 TABLET (15 MG TOTAL) BY MOUTH DAILY. TAKE WITH FOOD 30 tablet 1  . metFORMIN (GLUCOPHAGE) 1000 MG tablet Take 1 tablet (1,000 mg total) by mouth 2 (two) times daily with a meal. 60 tablet 6  . omega-3 acid ethyl esters (LOVAZA) 1 g capsule TAKE 1 CAPSULE BY MOUTH 2 TIMES DAILY. 180 capsule 2  . pravastatin (PRAVACHOL) 20 MG tablet Take 0.5 tablets (10 mg total) by mouth daily. 30 tablet 1  . tamsulosin (FLOMAX) 0.4 MG CAPS capsule Take 1 capsule (0.4 mg total) by mouth daily. 30 capsule 6  . TRUEPLUS INSULIN SYRINGE 30G X 5/16" 0.5 ML MISC USE AS DIRECTED 4 TIMES DAILY 100 each 3  . TRUEPLUS LANCETS 26G MISC 1 each by Does not apply route every 8 (eight) hours as needed. 100 each 12  . TRUEPLUS PEN NEEDLES 32G X 4 MM MISC USE AS DIRECTED ONCE  DAILY 100 each 6   No facility-administered medications prior to visit.     ROS Review of Systems  Constitutional: Negative for activity change and appetite change.  HENT: Negative for sinus pressure and sore throat.   Eyes: Negative for visual disturbance.  Respiratory: Negative for cough, chest tightness and shortness of breath.   Cardiovascular: Positive for leg swelling. Negative for chest pain.  Gastrointestinal: Negative for abdominal distention, abdominal pain, constipation and diarrhea.  Endocrine: Negative.   Genitourinary: Negative for dysuria.  Musculoskeletal: Negative for joint swelling and myalgias.  Skin: Negative for rash.  Allergic/Immunologic: Negative.   Neurological: Negative for weakness,  light-headedness and numbness.  Psychiatric/Behavioral: Negative for dysphoric mood and suicidal ideas.    Objective:  BP 134/71   Pulse 78   Temp 98.3 F (36.8 C) (Oral)   Ht '6\' 4"'  (1.93 m)   Wt (!) 371 lb (168.3 kg)   SpO2 95%   BMI 45.16 kg/m   BP/Weight 02/14/2020 11/15/2019 0/96/4383  Systolic BP 818 403 754  Diastolic BP 71 81 81  Wt. (Lbs) 371 369 373  BMI 45.16 44.92 45.4      Physical Exam Constitutional:      Appearance: He is well-developed.  Neck:     Vascular: No JVD.  Cardiovascular:     Rate and Rhythm: Normal rate.     Heart sounds: Normal heart sounds. No murmur heard.   Pulmonary:     Effort: Pulmonary effort is normal.     Breath sounds: Normal breath sounds. No wheezing or rales.  Chest:     Chest wall: No tenderness.  Abdominal:     General: Bowel sounds are normal. There is no distension.     Palpations: Abdomen is soft. There is no mass.     Tenderness: There is no abdominal tenderness.  Musculoskeletal:        General: Normal range of motion.     Right lower leg: No edema.     Left lower leg: No edema.  Neurological:     Mental Status: He is alert and oriented to person, place, and time.  Psychiatric:        Mood and Affect: Mood normal.    Diabetic Foot Exam - Simple   Simple Foot Form Diabetic Foot exam was performed with the following findings: Yes 02/14/2020  8:59 AM  Visual Inspection See comments: Yes Sensation Testing Pulse Check Posterior Tibialis and Dorsalis pulse intact bilaterally: Yes Comments Thick dystrophic big toenails b/l      CMP Latest Ref Rng & Units 11/15/2019 07/12/2019 04/19/2019  Glucose 65 - 99 mg/dL 174(H) 174(H) 127(H)  BUN 6 - 24 mg/dL '21 22 19  ' Creatinine 0.76 - 1.27 mg/dL 1.05 0.97 1.22  Sodium 134 - 144 mmol/L 141 145(H) 142  Potassium 3.5 - 5.2 mmol/L 5.5(H) 5.1 5.1  Chloride 96 - 106 mmol/L 102 104 104  CO2 20 - 29 mmol/L '27 28 24  ' Calcium 8.7 - 10.2 mg/dL 9.8 8.9 9.6  Total Protein 6.0 -  8.5 g/dL 7.1 6.9 -  Total Bilirubin 0.0 - 1.2 mg/dL 0.3 0.4 -  Alkaline Phos 44 - 121 IU/L 98 93 -  AST 0 - 40 IU/L 39 47(H) -  ALT 0 - 44 IU/L 28 36 -    Lipid Panel     Component Value Date/Time   CHOL 238 (H) 11/15/2019 0906   TRIG 136 11/15/2019 0906   HDL 47 11/15/2019 0906   CHOLHDL 5.1 (  H) 11/15/2019 0906   CHOLHDL 3.5 06/07/2015 1005   VLDL 33 (H) 06/07/2015 1005   LDLCALC 166 (H) 11/15/2019 0906    CBC    Component Value Date/Time   WBC 7.4 12/03/2015 1704   RBC 4.17 (L) 12/03/2015 1704   HGB 12.6 (L) 12/03/2015 1704   HCT 38.9 12/03/2015 1704   PLT 288 12/03/2015 1704   MCV 93.3 12/03/2015 1704   MCH 30.2 12/03/2015 1704   MCHC 32.4 12/03/2015 1704   RDW 13.1 12/03/2015 1704   LYMPHSABS 2,294 12/03/2015 1704   MONOABS 518 12/03/2015 1704   EOSABS 148 12/03/2015 1704   BASOSABS 74 12/03/2015 1704    Lab Results  Component Value Date   HGBA1C 8.4 (A) 02/14/2020    Assessment & Plan:  1. Type 2 diabetes mellitus with diabetic polyneuropathy, with long-term current use of insulin (HCC) Above goal; goal is less than 7.0 Farxiga added to regimen Counseled on Diabetic diet, my plate method, 536 minutes of moderate intensity exercise/week Blood sugar logs with fasting goals of 80-120 mg/dl, random of less than 180 and in the event of sugars less than 60 mg/dl or greater than 400 mg/dl encouraged to notify the clinic. Advised on the need for annual eye exams, annual foot exams, Pneumonia vaccine. - POCT glucose (manual entry) - POCT glycosylated hemoglobin (Hb A1C) - dapagliflozin propanediol (FARXIGA) 5 MG TABS tablet; Take 1 tablet (5 mg total) by mouth daily before breakfast.  Dispense: 90 tablet; Refill: 1  2. 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 Hyperlipidemia associated with type 2 diabetes mellitus  ([place]) Uncontrolled due to intolerance optimal dose of statin Low-cholesterol diet, exercise  5. Hypertension complicating diabetes ([place]) Controlled Counseled on blood pressure goal of less than 130/80, low-sodium, DASH diet, medication compliance, 150 minutes of moderate intensity exercise per week. Discussed medication compliance, adverse effects.    Meds ordered this encounter  Medications  . dapagliflozin propanediol (FARXIGA) 5 MG TABS tablet    Sig: Take 1 tablet (5 mg total) by mouth daily before breakfast.    Dispense:  90 tablet    Refill:  1    Follow-up: Return in about 3 months (around 05/14/2020) for Diabetes.       Charlott Rakes, MD, FAAFP. [facility] and [place], [place]   02/14/2020, 10:09 AM

## 2020-02-14 NOTE — Patient Instructions (Signed)
Dapagliflozin tablets What is this medicine? DAPAGLIFLOZIN (DAP a gli FLOE zin) controls blood sugar in people with diabetes. It is used with lifestyle changes like diet and exercise. It also treats heart failure. It may lower the need for treatment of heart failure in the hospital. This medicine may be used for other purposes; ask your health care provider or pharmacist if you have questions. COMMON BRAND NAME(S): Farxiga What should I tell my health care provider before I take this medicine? They need to know if you have any of these conditions:  dehydration  diabetic ketoacidosis  diet low in salt  eating less due to illness, surgery, dieting, or any other reason  having surgery  history of pancreatitis or pancreas problems  history of yeast infection of the penis or vagina  if you often drink alcohol  infections in the bladder, kidneys, or urinary tract  kidney disease  low blood pressure  on hemodialysis  problems urinating  type 1 diabetes  uncircumcised male  an unusual or allergic reaction to dapagliflozin, other medicines, foods, dyes, or preservatives  pregnant or trying to get pregnant  breast-feeding How should I use this medicine? Take this medicine by mouth with a glass of water. Follow the directions on the prescription label. You can take it with or without food. If it upsets your stomach, take it with food. Take this medicine in the morning. Take your dose at the same time each day. Do not take more often than directed. Do not stop taking except on your doctor's advice. A special MedGuide will be given to you by the pharmacist with each prescription and refill. Be sure to read this information carefully each time. Talk to your pediatrician regarding the use of this medicine in children. Special care may be needed. Overdosage: If you think you have taken too much of this medicine contact a poison control center or emergency room at once. NOTE: This  medicine is only for you. Do not share this medicine with others. What if I miss a dose? If you miss a dose, take it as soon as you can. If it is almost time for your next dose, take only that dose. Do not take double or extra doses. What may interact with this medicine? Do not take this medicine with any of the following medications:  gatifloxacin This medicine may also interact with the following medications:  alcohol  certain medicines for blood pressure, heart disease  diuretics  insulin  nateglinide  pioglitazone  quinolone antibiotics like ciprofloxacin, levofloxacin, ofloxacin  repaglinide  some herbal dietary supplements  steroid medicines like prednisone or cortisone  sulfonylureas like glimepiride, glipizide, glyburide  thyroid medicine This list may not describe all possible interactions. Give your health care provider a list of all the medicines, herbs, non-prescription drugs, or dietary supplements you use. Also tell them if you smoke, drink alcohol, or use illegal drugs. Some items may interact with your medicine. What should I watch for while using this medicine? Visit your doctor or health care professional for regular checks on your progress. This medicine can cause a serious condition in which there is too much acid in the blood. If you develop nausea, vomiting, stomach pain, unusual tiredness, or breathing problems, stop taking this medicine and call your doctor right away. If possible, use a ketone dipstick to check for ketones in your urine. A test called the HbA1C (A1C) will be monitored. This is a simple blood test. It measures your blood sugar control over   the last 2 to 3 months. You will receive this test every 3 to 6 months. Learn how to check your blood sugar. Learn the symptoms of low and high blood sugar and how to manage them. Always carry a quick-source of sugar with you in case you have symptoms of low blood sugar. Examples include hard sugar  candy or glucose tablets. Make sure others know that you can choke if you eat or drink when you develop serious symptoms of low blood sugar, such as seizures or unconsciousness. They must get medical help at once. Tell your doctor or health care professional if you have high blood sugar. You might need to change the dose of your medicine. If you are sick or exercising more than usual, you might need to change the dose of your medicine. Do not skip meals. Ask your doctor or health care professional if you should avoid alcohol. Many nonprescription cough and cold products contain sugar or alcohol. These can affect blood sugar. Wear a medical ID bracelet or chain, and carry a card that describes your disease and details of your medicine and dosage times. What side effects may I notice from receiving this medicine? Side effects that you should report to your doctor or health care professional as soon as possible:  allergic reactions like skin rash, itching or hives, swelling of the face, lips, or tongue  breathing problems  dizziness  feeling faint or lightheaded, falls  muscle weakness  nausea, vomiting, unusual stomach upset or pain  new pain or tenderness, change in skin color, sores or ulcers, or infection in legs or feet  penile discharge, itching, or pain in men  signs and symptoms of a genital infection, such as fever; tenderness, redness, or swelling in the genitals or area from the genitals to the back of the rectum  signs and symptoms of low blood sugar such as feeling anxious, confusion, dizziness, increased hunger, unusually weak or tired, sweating, shakiness, cold, irritable, headache, blurred vision, fast heartbeat, loss of consciousness  signs and symptoms of a urinary tract infection, such as fever, chills, a burning feeling when urinating, blood in the urine, back pain  trouble passing urine or change in the amount of urine, including an urgent need to urinate more often, in  larger amounts, or at night  unusual tiredness  vaginal discharge, itching, or odor in women Side effects that usually do not require medical attention (report to your doctor or health care professional if they continue or are bothersome):  mild increase in urination  thirsty This list may not describe all possible side effects. Call your doctor for medical advice about side effects. You may report side effects to FDA at 1-800-FDA-1088. Where should I keep my medicine? Keep out of the reach of children. Store at room temperature between 15 and 30 degrees C (59 and 86 degrees F). Throw away any unused medicine after the expiration date. NOTE: This sheet is a summary. It may not cover all possible information. If you have questions about this medicine, talk to your doctor, pharmacist, or health care provider.  2020 Elsevier/Gold Standard (2018-07-08 18:58:14)  

## 2020-02-15 LAB — BASIC METABOLIC PANEL
BUN/Creatinine Ratio: 19 (ref 9–20)
BUN: 19 mg/dL (ref 6–24)
CO2: 28 mmol/L (ref 20–29)
Calcium: 9.2 mg/dL (ref 8.7–10.2)
Chloride: 102 mmol/L (ref 96–106)
Creatinine, Ser: 1.02 mg/dL (ref 0.76–1.27)
GFR calc Af Amer: 94 mL/min/{1.73_m2} (ref >59)
GFR calc non Af Amer: 81 mL/min/{1.73_m2} (ref >59)
Glucose: 122 mg/dL — ABNORMAL HIGH (ref 65–99)
Potassium: 5.1 mmol/L (ref 3.5–5.2)
Sodium: 142 mmol/L (ref 134–144)

## 2020-02-15 MED FILL — MELOXICAM 15 MG TABLET: 15 | 30 days supply | Qty: 30 | Fill #1

## 2020-02-15 MED FILL — AMLODIPINE BESYLATE 5 MG TA: 5 | 30 days supply | Qty: 30 | Fill #3

## 2020-02-16 ENCOUNTER — Telehealth: Payer: Self-pay

## 2020-02-16 NOTE — Telephone Encounter (Signed)
Patient name and DOB has been verified Patient was informed of lab results. Patient had no questions.  

## 2020-02-28 ENCOUNTER — Other Ambulatory Visit: Payer: Self-pay | Admitting: Family Medicine

## 2020-02-28 MED FILL — METFORMIN HCL 1000 MG TABS: 1000 | 30 days supply | Qty: 60 | Fill #2

## 2020-02-28 MED FILL — OMEGA-3 ETHYL ESTERS 1 GM C: 1 | 30 days supply | Qty: 60 | Fill #5

## 2020-02-28 MED FILL — $VICTOZA 2-PAK 18MG/3ML PEN: 18 | 90 days supply | Qty: 27 | Fill #1

## 2020-02-28 MED FILL — GABAPENTIN 300 MG CAPSULE: 300 | 30 days supply | Qty: 90 | Fill #3

## 2020-02-28 MED FILL — TAMSULOSIN HCL 0.4 MG CAP: 0.4 | 30 days supply | Qty: 30 | Fill #3

## 2020-02-28 MED FILL — TRUE METRIX GLUCOSE TEST ST: 25 days supply | Qty: 100 | Fill #0

## 2020-03-12 ENCOUNTER — Other Ambulatory Visit: Payer: Self-pay | Admitting: Family Medicine

## 2020-03-12 MED FILL — FARXIGA 5 MG TABLET: 5 | 30 days supply | Qty: 30 | Fill #1

## 2020-03-12 MED FILL — AMLODIPINE BESYLATE 5 MG TA: 5 | 30 days supply | Qty: 30 | Fill #4

## 2020-03-12 MED FILL — TRUEPLUS SYR 0.5ML 30GX5/16: 30G X 5/16" | 25 days supply | Qty: 100 | Fill #3

## 2020-03-12 MED FILL — glipiZIDE 10 MG TABS: 10 | 30 days supply | Qty: 60 | Fill #6

## 2020-03-12 MED FILL — MELOXICAM 15 MG TABLET: 15 | 30 days supply | Qty: 30 | Fill #0

## 2020-03-15 MED FILL — AMLODIPINE BESYLATE 5 MG TA: 5 | 30 days supply | Qty: 30 | Fill #5

## 2020-03-26 MED FILL — METFORMIN HCL 1000 MG TABS: 1000 | 30 days supply | Qty: 60 | Fill #3

## 2020-03-26 MED FILL — TAMSULOSIN HCL 0.4 MG CAP: 0.4 | 30 days supply | Qty: 30 | Fill #4

## 2020-03-26 MED FILL — OMEGA-3 ETHYL ESTERS 1 GM C: 1 | 30 days supply | Qty: 60 | Fill #6

## 2020-04-02 MED FILL — PRAVASTATIN SODIUM 20 MG TA: 20 | 30 days supply | Qty: 15 | Fill #2

## 2020-04-02 MED FILL — GABAPENTIN 300 MG CAPSULE: 300 | 30 days supply | Qty: 90 | Fill #4

## 2020-04-06 ENCOUNTER — Telehealth: Payer: Self-pay | Admitting: Family Medicine

## 2020-04-06 NOTE — Telephone Encounter (Signed)
Patient mailed in a blood sugar log revealing fasting sugars between 84 - 143, single value of 162, 174, single low of 60

## 2020-04-09 MED FILL — FARXIGA 5 MG TABLET: 5 | 30 days supply | Qty: 30 | Fill #2

## 2020-04-12 MED FILL — glipiZIDE 10 MG TABS: 10 | 30 days supply | Qty: 60 | Fill #0

## 2020-04-18 MED FILL — MELOXICAM 15 MG TABLET: 15 | 30 days supply | Qty: 30 | Fill #1

## 2020-04-27 MED FILL — TAMSULOSIN HCL 0.4 MG CAP: 0.4 | 30 days supply | Qty: 30 | Fill #5

## 2020-04-27 MED FILL — METFORMIN HCL 1000 MG TABS: 1000 | 30 days supply | Qty: 60 | Fill #4

## 2020-04-27 MED FILL — OMEGA-3 ETHYL ESTERS 1 GM C: 1 | 30 days supply | Qty: 60 | Fill #7

## 2020-05-07 MED FILL — FARXIGA 5 MG TABLET: 5 | 30 days supply | Qty: 30 | Fill #3

## 2020-05-07 MED FILL — $LEVEMIR 100U/ML VIAL: 100 | 42 days supply | Qty: 30 | Fill #2

## 2020-05-07 MED FILL — GABAPENTIN 300 MG CAPSULE: 300 | 30 days supply | Qty: 90 | Fill #5

## 2020-05-14 ENCOUNTER — Other Ambulatory Visit: Payer: Self-pay | Admitting: Family Medicine

## 2020-05-14 DIAGNOSIS — E7800 Pure hypercholesterolemia, unspecified: Secondary | ICD-10-CM

## 2020-05-14 MED FILL — ?glipiZIDE 10MG TABLETS: 10 | 30 days supply | Qty: 60 | Fill #1

## 2020-05-14 NOTE — Telephone Encounter (Signed)
Requested Prescriptions  Pending Prescriptions Disp Refills  . ezetimibe (ZETIA) 10 MG tablet [Pharmacy Med Name: EZETIMIBE 10 MG TABS 10 Tablet] 90 tablet 1    Sig: TAKE 1 TABLET (10 MG TOTAL) BY MOUTH DAILY.     Cardiovascular:  Antilipid - Sterol Transport Inhibitors Failed - 05/14/2020  8:48 AM      Failed - Total Cholesterol in normal range and within 360 days    Cholesterol, Total  Date Value Ref Range Status  11/15/2019 238 (H) 100 - 199 mg/dL Final         Failed - LDL in normal range and within 360 days    LDL Chol Calc (NIH)  Date Value Ref Range Status  11/15/2019 166 (H) 0 - 99 mg/dL Final         Passed - HDL in normal range and within 360 days    HDL  Date Value Ref Range Status  11/15/2019 47 >39 mg/dL Final         Passed - Triglycerides in normal range and within 360 days    Triglycerides  Date Value Ref Range Status  11/15/2019 136 0 - 149 mg/dL Final         Passed - Valid encounter within last 12 months    Recent Outpatient Visits          3 months ago Type 2 diabetes mellitus with diabetic polyneuropathy, with long-term current use of insulin (HCC)   [facility], [place], MD   6 months ago Type 2 diabetes mellitus with diabetic polyneuropathy, with long-term current use of insulin (HCC)   [facility] [place], [place], MD   10 months ago Type 2 diabetes mellitus with diabetic polyneuropathy, with long-term current use of insulin (HCC)   [facility] [place], [place], MD   1 year ago Type 2 diabetes mellitus with diabetic polyneuropathy, with long-term current use of insulin (HCC)   [facility] [place], [place], MD   1 year ago Type 2 diabetes mellitus with diabetic polyneuropathy, with long-term current use of insulin (HCC)    [facility], Odette [place], MD      Future Appointments             Tomorrow Hoy Register, MD [facility]

## 2020-05-15 ENCOUNTER — Other Ambulatory Visit: Payer: Self-pay | Admitting: Family Medicine

## 2020-05-15 ENCOUNTER — Other Ambulatory Visit: Payer: Self-pay

## 2020-05-15 ENCOUNTER — Ambulatory Visit: Payer: Self-pay | Attending: Family Medicine | Admitting: Family Medicine

## 2020-05-15 ENCOUNTER — Encounter: Payer: Self-pay | Admitting: Family Medicine

## 2020-05-15 VITALS — BP 120/73 | HR 64 | Ht 75.0 in | Wt 369.0 lb

## 2020-05-15 DIAGNOSIS — E7800 Pure hypercholesterolemia, unspecified: Secondary | ICD-10-CM

## 2020-05-15 DIAGNOSIS — R399 Unspecified symptoms and signs involving the genitourinary system: Secondary | ICD-10-CM

## 2020-05-15 DIAGNOSIS — Z794 Long term (current) use of insulin: Secondary | ICD-10-CM

## 2020-05-15 DIAGNOSIS — I10 Essential (primary) hypertension: Secondary | ICD-10-CM

## 2020-05-15 DIAGNOSIS — E1142 Type 2 diabetes mellitus with diabetic polyneuropathy: Secondary | ICD-10-CM

## 2020-05-15 LAB — POCT GLYCOSYLATED HEMOGLOBIN (HGB A1C): HbA1c, POC (controlled diabetic range): 8.4 % — AB (ref 0.0–7.0)

## 2020-05-15 LAB — GLUCOSE, POCT (MANUAL RESULT ENTRY): POC Glucose: 143 mg/dl — AB (ref 70–99)

## 2020-05-15 MED ORDER — DAPAGLIFLOZIN PROPANEDIOL 10 MG PO TABS: 10.0000 mg | ORAL_TABLET | Freq: Every day | ORAL | 6 refills | Status: DC

## 2020-05-15 MED ORDER — PRAVASTATIN SODIUM 20 MG PO TABS: 10.0000 mg | ORAL_TABLET | Freq: Every day | ORAL | 1 refills | Status: DC

## 2020-05-15 MED ORDER — GLIPIZIDE 10 MG PO TABS: 10.0000 mg | ORAL_TABLET | Freq: Two times a day (BID) | ORAL | 6 refills | Status: DC

## 2020-05-15 MED ORDER — GABAPENTIN 300 MG PO CAPS: 300.0000 mg | ORAL_CAPSULE | Freq: Three times a day (TID) | ORAL | 6 refills | Status: DC

## 2020-05-15 MED ORDER — EZETIMIBE 10 MG PO TABS: 10.0000 mg | ORAL_TABLET | Freq: Every day | ORAL | 1 refills | Status: DC

## 2020-05-15 MED ORDER — AMLODIPINE BESYLATE 5 MG PO TABS: 5.0000 mg | ORAL_TABLET | Freq: Every day | ORAL | 1 refills | Status: DC

## 2020-05-15 MED ORDER — METFORMIN HCL 1000 MG PO TABS: 1000.0000 mg | ORAL_TABLET | Freq: Two times a day (BID) | ORAL | 6 refills | Status: DC

## 2020-05-15 MED ORDER — INSULIN DETEMIR 100 UNIT/ML ~~LOC~~ SOLN: 35.0000 [IU] | Freq: Two times a day (BID) | SUBCUTANEOUS | 6 refills | Status: DC

## 2020-05-15 MED ORDER — TAMSULOSIN HCL 0.4 MG PO CAPS: 0.4000 mg | ORAL_CAPSULE | Freq: Every day | ORAL | 6 refills | Status: DC

## 2020-05-15 MED ORDER — VICTOZA 18 MG/3ML ~~LOC~~ SOPN: 1.8000 mg | PEN_INJECTOR | Freq: Every day | SUBCUTANEOUS | 6 refills | Status: DC

## 2020-05-15 NOTE — Progress Notes (Signed)
Has been having frequent urination.

## 2020-05-15 NOTE — Progress Notes (Signed)
Subjective:  Patient ID: Dillon Mcgee, male    DOB: 10-17-1960  Age: 60 y.o. MRN: [id number]  CC: Diabetes   HPI Dillon Mcgee is a 60 year old male with a history of hypertension, type 2 diabetes mellitus (A1c8.4), hyperlipidemia, obesity here for follow-up visit. Fasting sugars have been 83-163 and he had a single value of 254.  He endorses compliance with his medications and denies hypoglycemic episodes, numbness in extremity or visual concerns.  Compliant with his antihypertensive and his statin he takes every other day due to myalgias.  He is also on Zetia for hyperlipidemia.  Major form of exercise is what he gets at his job which is very physical. He has noticed some urinary urgency but denies presence of dysuria, abdominal pain, flank pain, hematuria and remains compliant with Flomax. He is doing well otherwise. Past Medical History:  Diagnosis Date  . Diabetes mellitus without complication ([place])   . Hyperlipidemia   . Hypertension     Past Surgical History:  Procedure Laterality Date  . COLONOSCOPY     5 years ago     Family History  Problem Relation Age of Onset  . Heart disease Maternal Aunt   . Diabetes Maternal Uncle     Allergies  Allergen Reactions  . Pioglitazone Swelling    Lower extremity swelling.    Outpatient Medications Prior to Visit  Medication Sig Dispense Refill  . aspirin EC 81 MG tablet Take 1 tablet (81 mg total) daily by mouth. 90 tablet 3  . Blood Glucose Monitoring Suppl (TRUE METRIX GO GLUCOSE METER) w/Device KIT 1 each by Does not apply route every 8 (eight) hours as needed. 1 kit 0  . ferrous sulfate 325 (65 FE) MG tablet Take 325 mg by mouth daily with breakfast.    . glucose blood (TRUE METRIX BLOOD GLUCOSE TEST) test strip Use as instructed 100 each 12  . meloxicam (MOBIC) 15 MG tablet TAKE 1 TABLET (15 MG TOTAL) BY MOUTH DAILY. TAKE WITH FOOD 30 tablet 1  . omega-3 acid ethyl esters (LOVAZA) 1 g capsule TAKE 1 CAPSULE BY  MOUTH 2 TIMES DAILY. 180 capsule 2  . TRUEPLUS INSULIN SYRINGE 30G X 5/16" 0.5 ML MISC USE AS DIRECTED 4 TIMES DAILY 100 each 3  . TRUEPLUS LANCETS 26G MISC 1 each by Does not apply route every 8 (eight) hours as needed. 100 each 12  . TRUEPLUS PEN NEEDLES 32G X 4 MM MISC USE AS DIRECTED ONCE DAILY 100 each 6  . amLODipine (NORVASC) 5 MG tablet Take 1 tablet (5 mg total) by mouth daily. 90 tablet 3  . dapagliflozin propanediol (FARXIGA) 5 MG TABS tablet Take 1 tablet (5 mg total) by mouth daily before breakfast. 90 tablet 1  . ezetimibe (ZETIA) 10 MG tablet TAKE 1 TABLET (10 MG TOTAL) BY MOUTH DAILY. 90 tablet 1  . gabapentin (NEURONTIN) 300 MG capsule Take 1 capsule (300 mg total) by mouth 3 (three) times daily. 90 capsule 6  . glipiZIDE (GLUCOTROL) 10 MG tablet Take 1 tablet (10 mg total) by mouth 2 (two) times daily before a meal. 60 tablet 6  . insulin detemir (LEVEMIR) 100 UNIT/ML injection Inject 0.35 mLs (35 Units total) into the skin 2 (two) times daily. 30 mL 6  . liraglutide (VICTOZA) 18 MG/3ML SOPN Inject 1.8 mg into the skin daily. 30 mL 6  . metFORMIN (GLUCOPHAGE) 1000 MG tablet Take 1 tablet (1,000 mg total) by mouth 2 (two) times daily with a meal. 60  tablet 6  . pravastatin (PRAVACHOL) 20 MG tablet Take 0.5 tablets (10 mg total) by mouth daily. 30 tablet 1  . tamsulosin (FLOMAX) 0.4 MG CAPS capsule Take 1 capsule (0.4 mg total) by mouth daily. 30 capsule 6   No facility-administered medications prior to visit.     ROS Review of Systems  Constitutional: Negative for activity change and appetite change.  HENT: Negative for sinus pressure and sore throat.   Eyes: Negative for visual disturbance.  Respiratory: Negative for cough, chest tightness and shortness of breath.   Cardiovascular: Negative for chest pain and leg swelling.  Gastrointestinal: Negative for abdominal distention, abdominal pain, constipation and diarrhea.  Endocrine: Negative.   Genitourinary: Negative for  dysuria.  Musculoskeletal: Negative for joint swelling and myalgias.  Skin: Negative for rash.  Allergic/Immunologic: Negative.   Neurological: Negative for weakness, light-headedness and numbness.  Psychiatric/Behavioral: Negative for dysphoric mood and suicidal ideas.    Objective:  BP 120/73   Pulse 64   Ht _0  (1.905 m)   Wt (!) 369 lb (167.4 kg)   SpO2 95%   BMI 46.12 kg/m   BP/Weight 05/15/2020 02/14/2020 09/11/1973  Systolic BP 883 254 982  Diastolic BP 73 71 81  Wt. (Lbs) 369 371 369  BMI 46.12 45.16 44.92      Physical Exam Constitutional:      Appearance: He is well-developed.  Neck:     Vascular: No JVD.  Cardiovascular:     Rate and Rhythm: Normal rate.     Heart sounds: Normal heart sounds. No murmur heard.   Pulmonary:     Effort: Pulmonary effort is normal.     Breath sounds: Normal breath sounds. No wheezing or rales.  Chest:     Chest wall: No tenderness.  Abdominal:     General: Bowel sounds are normal. There is distension.     Palpations: Abdomen is soft. There is no mass.     Tenderness: There is no abdominal tenderness.  Musculoskeletal:        General: Normal range of motion.     Right lower leg: No edema.     Left lower leg: No edema.  Neurological:     Mental Status: He is alert and oriented to person, place, and time.  Psychiatric:        Mood and Affect: Mood normal.     CMP Latest Ref Rng & Units 02/14/2020 11/15/2019 07/12/2019  Glucose 65 - 99 mg/dL 122(H) 174(H) 174(H)  BUN 6 - 24 mg/dL _1 Creatinine 0.76 - 1.27 mg/dL 1.02 1.05 0.97  Sodium 134 - 144 mmol/L 142 141 145(H)  Potassium 3.5 - 5.2 mmol/L 5.1 5.5(H) 5.1  Chloride 96 - 106 mmol/L 102 102 104  CO2 20 - 29 mmol/L _2 Calcium 8.7 - 10.2 mg/dL 9.2 9.8 8.9  Total Protein 6.0 - 8.5 g/dL - 7.1 6.9  Total Bilirubin 0.0 - 1.2 mg/dL - 0.3 0.4  Alkaline Phos 44 - 121 IU/L - 98 93  AST 0 - 40 IU/L - 39 47(H)  ALT 0 - 44 IU/L - 28 36    Lipid Panel      Component Value Date/Time   CHOL 238 (H) 11/15/2019 0906   TRIG 136 11/15/2019 0906   HDL 47 11/15/2019 0906   CHOLHDL 5.1 (H) 11/15/2019 0906   CHOLHDL 3.5 06/07/2015 1005   VLDL 33 (H) 06/07/2015 1005   LDLCALC 166 (H) 11/15/2019 6415  CBC    Component Value Date/Time   WBC 7.4 12/03/2015 1704   RBC 4.17 (L) 12/03/2015 1704   HGB 12.6 (L) 12/03/2015 1704   HCT 38.9 12/03/2015 1704   PLT 288 12/03/2015 1704   MCV 93.3 12/03/2015 1704   MCH 30.2 12/03/2015 1704   MCHC 32.4 12/03/2015 1704   RDW 13.1 12/03/2015 1704   LYMPHSABS 2,294 12/03/2015 1704   MONOABS 518 12/03/2015 1704   EOSABS 148 12/03/2015 1704   BASOSABS 74 12/03/2015 1704    Lab Results  Component Value Date   HGBA1C 8.4 (A) 05/15/2020    Assessment & Plan:  1. Type 2 diabetes mellitus with diabetic polyneuropathy, with long-term current use of insulin (HCC) Uncontrolled with A1c of 8.4; goal is less than 7.0 Increase Farxiga from 5 mg to 10 mg Counseled on Diabetic diet, my plate method, 536 minutes of moderate intensity exercise/week Blood sugar logs with fasting goals of 80-120 mg/dl, random of less than 180 and in the event of sugars less than 60 mg/dl or greater than 400 mg/dl encouraged to notify the clinic. Advised on the need for annual eye exams, annual foot exams, Pneumonia vaccine. - POCT glucose (manual entry) - POCT glycosylated hemoglobin (Hb A1C) - metFORMIN (GLUCOPHAGE) 1000 MG tablet; Take 1 tablet (1,000 mg total) by mouth 2 (two) times daily with a meal.  Dispense: 60 tablet; Refill: 6 - liraglutide (VICTOZA) 18 MG/3ML SOPN; Inject 1.8 mg into the skin daily.  Dispense: 30 mL; Refill: 6 - insulin detemir (LEVEMIR) 100 UNIT/ML injection; Inject 0.35 mLs (35 Units total) into the skin 2 (two) times daily.  Dispense: 30 mL; Refill: 6 - glipiZIDE (GLUCOTROL) 10 MG tablet; Take 1 tablet (10 mg total) by mouth 2 (two) times daily before a meal.  Dispense: 60 tablet; Refill: 6 -  dapagliflozin propanediol (FARXIGA) 10 MG TABS tablet; Take 1 tablet (10 mg total) by mouth daily before breakfast.  Dispense: 30 tablet; Refill: 6 - gabapentin (NEURONTIN) 300 MG capsule; Take 1 capsule (300 mg total) by mouth 3 (three) times daily.  Dispense: 90 capsule; Refill: 6  2. Lower urinary tract symptoms He does have some urgency We have discussed the possibility of hypoglycemia as a possible etiology and we will work on his glycemic control If symptoms persist consider referral to urology. - tamsulosin (FLOMAX) 0.4 MG CAPS capsule; Take 1 capsule (0.4 mg total) by mouth daily.  Dispense: 30 capsule; Refill: 6  3. Pure hypercholesterolemia Uncontrolled due to statin intolerance He is able to tolerate a low intensity statin every other day and we will continue with this along with Zetia Low-cholesterol diet - pravastatin (PRAVACHOL) 20 MG tablet; Take 0.5 tablets (10 mg total) by mouth daily.  Dispense: 30 tablet; Refill: 1 - ezetimibe (ZETIA) 10 MG tablet; Take 1 tablet (10 mg total) by mouth daily.  Dispense: 90 tablet; Refill: 1  4. Essential hypertension Controlled Counseled on blood pressure goal of less than 130/80, low-sodium, DASH diet, medication compliance, 150 minutes of moderate intensity exercise per week. Discussed medication compliance, adverse effects. - amLODipine (NORVASC) 5 MG tablet; Take 1 tablet (5 mg total) by mouth daily.  Dispense: 90 tablet; Refill: 1   Meds ordered this encounter  Medications  . tamsulosin (FLOMAX) 0.4 MG CAPS capsule    Sig: Take 1 capsule (0.4 mg total) by mouth daily.    Dispense:  30 capsule    Refill:  6  . pravastatin (PRAVACHOL) 20 MG tablet  Sig: Take 0.5 tablets (10 mg total) by mouth daily.    Dispense:  30 tablet    Refill:  1  . metFORMIN (GLUCOPHAGE) 1000 MG tablet    Sig: Take 1 tablet (1,000 mg total) by mouth 2 (two) times daily with a meal.    Dispense:  60 tablet    Refill:  6  . liraglutide (VICTOZA) 18  MG/3ML SOPN    Sig: Inject 1.8 mg into the skin daily.    Dispense:  30 mL    Refill:  6  . insulin detemir (LEVEMIR) 100 UNIT/ML injection    Sig: Inject 0.35 mLs (35 Units total) into the skin 2 (two) times daily.    Dispense:  30 mL    Refill:  6    Please mail  . glipiZIDE (GLUCOTROL) 10 MG tablet    Sig: Take 1 tablet (10 mg total) by mouth 2 (two) times daily before a meal.    Dispense:  60 tablet    Refill:  6  . ezetimibe (ZETIA) 10 MG tablet    Sig: Take 1 tablet (10 mg total) by mouth daily.    Dispense:  90 tablet    Refill:  1  . amLODipine (NORVASC) 5 MG tablet    Sig: Take 1 tablet (5 mg total) by mouth daily.    Dispense:  90 tablet    Refill:  1  . dapagliflozin propanediol (FARXIGA) 10 MG TABS tablet    Sig: Take 1 tablet (10 mg total) by mouth daily before breakfast.    Dispense:  30 tablet    Refill:  6    Dose increase  . gabapentin (NEURONTIN) 300 MG capsule    Sig: Take 1 capsule (300 mg total) by mouth 3 (three) times daily.    Dispense:  90 capsule    Refill:  6    Follow-up: Return in about 3 months (around 08/15/2020) for Diabetes.       Charlott Rakes, MD, FAAFP. [facility] and [place], [place]   05/15/2020, 10:46 AM

## 2020-05-15 NOTE — Patient Instructions (Signed)
Diabetes Mellitus and Exercise Exercising regularly is important for overall health, especially for people who have diabetes mellitus. Exercising is not only about losing weight. It has many other health benefits, such as increasing muscle strength and bone density and reducing body fat and stress. This leads to improved fitness, flexibility, and endurance, all of which result in better overall health. What are the benefits of exercise if I have diabetes? Exercise has many benefits for people with diabetes. They include:  Helping to lower and control blood sugar (glucose).  Helping the body to respond better to the hormone insulin by improving insulin sensitivity.  Reducing how much insulin the body needs.  Lowering the risk for heart disease by: ? Lowering "bad" cholesterol and triglyceride levels. ? Increasing "good" cholesterol levels. ? Lowering blood pressure. ? Lowering blood glucose levels. What is my activity plan? Your health care provider or certified diabetes educator can help you make a plan for the type and frequency of exercise that works for you. This is called your activity plan. Be sure to:  Get at least 150 minutes of medium-intensity or high-intensity exercise each week. Exercises may include brisk walking, biking, or water aerobics.  Do stretching and strengthening exercises, such as yoga or weight lifting, at least 2 times a week.  Spread out your activity over at least 3 days of the week.  Get some form of physical activity each day. ? Do not go more than 2 days in a row without some kind of physical activity. ? Avoid being inactive for more than 90 minutes at a time. Take frequent breaks to walk or stretch.  Choose exercises or activities that you enjoy. Set realistic goals.  Start slowly and gradually increase your exercise intensity over time.   How do I manage my diabetes during exercise? Monitor your blood glucose  Check your blood glucose before and  after exercising. If your blood glucose is: ? 240 mg/dL (13.3 mmol/L) or higher before you exercise, check your urine for ketones. These are chemicals created by the liver. If you have ketones in your urine, do not exercise until your blood glucose returns to normal. ? 100 mg/dL (5.6 mmol/L) or lower, eat a snack containing 15-20 grams of carbohydrate. Check your blood glucose 15 minutes after the snack to make sure that your glucose level is above 100 mg/dL (5.6 mmol/L) before you start your exercise.  Know the symptoms of low blood glucose (hypoglycemia) and how to treat it. Your risk for hypoglycemia increases during and after exercise. Follow these tips and your health care provider's instructions  Keep a carbohydrate snack that is fast-acting for use before, during, and after exercise to help prevent or treat hypoglycemia.  Avoid injecting insulin into areas of the body that are going to be exercised. For example, avoid injecting insulin into: ? Your arms, when you are about to play tennis. ? Your legs, when you are about to go jogging.  Keep records of your exercise habits. Doing this can help you and your health care provider adjust your diabetes management plan as needed. Write down: ? Food that you eat before and after you exercise. ? Blood glucose levels before and after you exercise. ? The type and amount of exercise you have done.  Work with your health care provider when you start a new exercise or activity. He or she may need to: ? Make sure that the activity is safe for you. ? Adjust your insulin, other medicines, and food that   you eat.  Drink plenty of water while you exercise. This prevents loss of water (dehydration) and problems caused by a lot of heat in the body (heat stroke).   Where to find more information  American Diabetes Association: www.diabetes.org Summary  Exercising regularly is important for overall health, especially for people who have diabetes  mellitus.  Exercising has many health benefits. It increases muscle strength and bone density and reduces body fat and stress. It also lowers and controls blood glucose.  Your health care provider or certified diabetes educator can help you make an activity plan for the type and frequency of exercise that works for you.  Work with your health care provider to make sure any new activity is safe for you. Also work with your health care provider to adjust your insulin, other medicines, and the food you eat. This information is not intended to replace advice given to you by your health care provider. Make sure you discuss any questions you have with your health care provider. Document Revised: 11/15/2018 Document Reviewed: 11/15/2018 Elsevier Patient Education  2021 Elsevier Inc.  

## 2020-05-17 ENCOUNTER — Other Ambulatory Visit: Payer: Self-pay | Admitting: Family Medicine

## 2020-05-25 MED FILL — $VICTOZA 2-PAK 18MG/3ML PEN: 18 | 30 days supply | Qty: 9 | Fill #2

## 2020-05-25 MED FILL — TAMSULOSIN HCL 0.4 MG CAP: 0.4 | 30 days supply | Qty: 30 | Fill #6

## 2020-05-25 MED FILL — METFORMIN HCL 1000 MG TABS: 1000 | 30 days supply | Qty: 60 | Fill #5

## 2020-05-25 MED FILL — OMEGA-3 ETHYL ESTERS 1 GM C: 1 | 30 days supply | Qty: 60 | Fill #8

## 2020-06-02 ENCOUNTER — Other Ambulatory Visit: Payer: Self-pay

## 2020-06-08 ENCOUNTER — Other Ambulatory Visit: Payer: Self-pay

## 2020-06-08 MED FILL — Gabapentin Cap 300 MG: ORAL | 30 days supply | Qty: 90 | Fill #0 | Status: AC

## 2020-06-08 MED FILL — Glucose Blood Test Strip: 25 days supply | Qty: 100 | Fill #0 | Status: AC

## 2020-06-14 ENCOUNTER — Other Ambulatory Visit: Payer: Self-pay | Admitting: Family Medicine

## 2020-06-14 DIAGNOSIS — Z794 Long term (current) use of insulin: Secondary | ICD-10-CM

## 2020-06-14 DIAGNOSIS — E1142 Type 2 diabetes mellitus with diabetic polyneuropathy: Secondary | ICD-10-CM

## 2020-06-18 ENCOUNTER — Other Ambulatory Visit: Payer: Self-pay

## 2020-06-18 ENCOUNTER — Other Ambulatory Visit: Payer: Self-pay | Admitting: Family Medicine

## 2020-06-18 DIAGNOSIS — Z794 Long term (current) use of insulin: Secondary | ICD-10-CM

## 2020-06-18 DIAGNOSIS — E1142 Type 2 diabetes mellitus with diabetic polyneuropathy: Secondary | ICD-10-CM

## 2020-06-19 ENCOUNTER — Other Ambulatory Visit: Payer: Self-pay

## 2020-06-19 MED ORDER — "INSULIN SYRINGE-NEEDLE U-100 30G X 5/16"" 0.5 ML MISC"
Freq: Four times a day (QID) | 3 refills | Status: DC
  Filled 2020-06-19: qty 100, 25d supply, fill #0
  Filled 2020-10-04 – 2020-10-05 (×4): qty 100, 25d supply, fill #1
  Filled 2021-01-06: qty 100, 25d supply, fill #2
  Filled 2021-04-11: qty 100, 25d supply, fill #0

## 2020-06-19 MED FILL — Amlodipine Besylate Tab 5 MG (Base Equivalent): ORAL | 30 days supply | Qty: 30 | Fill #0 | Status: AC

## 2020-06-19 MED FILL — Liraglutide Soln Pen-injector 18 MG/3ML (6 MG/ML): SUBCUTANEOUS | 30 days supply | Qty: 9 | Fill #0 | Status: AC

## 2020-06-19 MED FILL — Insulin Detemir Inj 100 Unit/ML: SUBCUTANEOUS | 28 days supply | Qty: 20 | Fill #0 | Status: AC

## 2020-06-19 MED FILL — Glipizide Tab 10 MG: ORAL | 30 days supply | Qty: 60 | Fill #0 | Status: AC

## 2020-06-19 MED FILL — Ezetimibe Tab 10 MG: ORAL | 30 days supply | Qty: 30 | Fill #0 | Status: AC

## 2020-06-22 ENCOUNTER — Other Ambulatory Visit: Payer: Self-pay

## 2020-06-25 ENCOUNTER — Other Ambulatory Visit: Payer: Self-pay

## 2020-06-25 ENCOUNTER — Other Ambulatory Visit: Payer: Self-pay | Admitting: Family Medicine

## 2020-06-25 DIAGNOSIS — E1169 Type 2 diabetes mellitus with other specified complication: Secondary | ICD-10-CM

## 2020-06-25 DIAGNOSIS — E785 Hyperlipidemia, unspecified: Secondary | ICD-10-CM

## 2020-06-25 MED ORDER — OMEGA-3-ACID ETHYL ESTERS 1 G PO CAPS
1.0000 | ORAL_CAPSULE | Freq: Two times a day (BID) | ORAL | 0 refills | Status: DC
Start: 2020-06-25 — End: 2020-09-29
  Filled 2020-06-25 (×2): qty 60, 30d supply, fill #0
  Filled 2020-07-28: qty 60, 30d supply, fill #1
  Filled 2020-08-29: qty 60, 30d supply, fill #2

## 2020-06-25 MED FILL — Meloxicam Tab 15 MG: ORAL | 30 days supply | Qty: 30 | Fill #0 | Status: AC

## 2020-06-25 MED FILL — Tamsulosin HCl Cap 0.4 MG: ORAL | 30 days supply | Qty: 30 | Fill #0 | Status: AC

## 2020-06-26 ENCOUNTER — Other Ambulatory Visit: Payer: Self-pay

## 2020-06-29 ENCOUNTER — Other Ambulatory Visit: Payer: Self-pay

## 2020-07-04 ENCOUNTER — Other Ambulatory Visit: Payer: Self-pay

## 2020-07-04 MED FILL — Dapagliflozin Propanediol Tab 10 MG (Base Equivalent): ORAL | 30 days supply | Qty: 30 | Fill #0 | Status: AC

## 2020-07-04 MED FILL — Amlodipine Besylate Tab 5 MG (Base Equivalent): ORAL | 30 days supply | Qty: 30 | Fill #1 | Status: AC

## 2020-07-04 MED FILL — Metformin HCl Tab 1000 MG: ORAL | 30 days supply | Qty: 60 | Fill #0 | Status: AC

## 2020-07-06 ENCOUNTER — Other Ambulatory Visit: Payer: Self-pay

## 2020-07-06 MED FILL — Pravastatin Sodium Tab 20 MG: ORAL | 30 days supply | Qty: 15 | Fill #0 | Status: AC

## 2020-07-12 MED FILL — Gabapentin Cap 300 MG: ORAL | 30 days supply | Qty: 90 | Fill #1 | Status: AC

## 2020-07-13 ENCOUNTER — Other Ambulatory Visit: Payer: Self-pay

## 2020-07-14 MED FILL — Ezetimibe Tab 10 MG: ORAL | 30 days supply | Qty: 30 | Fill #1 | Status: AC

## 2020-07-16 ENCOUNTER — Other Ambulatory Visit (HOSPITAL_COMMUNITY): Payer: Self-pay

## 2020-07-16 ENCOUNTER — Other Ambulatory Visit: Payer: Self-pay

## 2020-07-17 ENCOUNTER — Other Ambulatory Visit (HOSPITAL_COMMUNITY): Payer: Self-pay

## 2020-07-17 ENCOUNTER — Other Ambulatory Visit: Payer: Self-pay

## 2020-07-20 MED FILL — Liraglutide Soln Pen-injector 18 MG/3ML (6 MG/ML): SUBCUTANEOUS | 30 days supply | Qty: 9 | Fill #1 | Status: CN

## 2020-07-20 MED FILL — Insulin Detemir Inj 100 Unit/ML: SUBCUTANEOUS | 28 days supply | Qty: 20 | Fill #1 | Status: CN

## 2020-07-20 MED FILL — Glipizide Tab 10 MG: ORAL | 30 days supply | Qty: 60 | Fill #1 | Status: CN

## 2020-07-23 ENCOUNTER — Other Ambulatory Visit: Payer: Self-pay

## 2020-07-23 MED FILL — Insulin Detemir Inj 100 Unit/ML: SUBCUTANEOUS | 85 days supply | Qty: 60 | Fill #1 | Status: AC

## 2020-07-23 MED FILL — Liraglutide Soln Pen-injector 18 MG/3ML (6 MG/ML): SUBCUTANEOUS | 90 days supply | Qty: 27 | Fill #1 | Status: AC

## 2020-07-23 MED FILL — Glipizide Tab 10 MG: ORAL | 30 days supply | Qty: 60 | Fill #1 | Status: AC

## 2020-07-25 ENCOUNTER — Other Ambulatory Visit: Payer: Self-pay

## 2020-07-28 MED FILL — Metformin HCl Tab 1000 MG: ORAL | 30 days supply | Qty: 60 | Fill #1 | Status: AC

## 2020-07-28 MED FILL — Meloxicam Tab 15 MG: ORAL | 30 days supply | Qty: 30 | Fill #1 | Status: AC

## 2020-07-28 MED FILL — Tamsulosin HCl Cap 0.4 MG: ORAL | 30 days supply | Qty: 30 | Fill #1 | Status: AC

## 2020-07-31 ENCOUNTER — Other Ambulatory Visit: Payer: Self-pay

## 2020-08-03 ENCOUNTER — Other Ambulatory Visit: Payer: Self-pay

## 2020-08-03 MED FILL — Dapagliflozin Propanediol Tab 10 MG (Base Equivalent): ORAL | 30 days supply | Qty: 30 | Fill #1 | Status: CN

## 2020-08-06 ENCOUNTER — Other Ambulatory Visit: Payer: Self-pay

## 2020-08-09 ENCOUNTER — Telehealth: Payer: Self-pay | Admitting: Family Medicine

## 2020-08-09 NOTE — Telephone Encounter (Signed)
Patient has appt with Dr. Alvis Lemmings 6/14 at 8:30. Called patient and no answer. A voicemail was left making patient aware the appt will be virtual because provider will be out the office. If an in person is preferred to call [phone] to reschedule for another day.

## 2020-08-10 MED FILL — Gabapentin Cap 300 MG: ORAL | 30 days supply | Qty: 90 | Fill #2 | Status: AC

## 2020-08-10 MED FILL — Ezetimibe Tab 10 MG: ORAL | 30 days supply | Qty: 30 | Fill #2 | Status: CN

## 2020-08-10 MED FILL — Amlodipine Besylate Tab 5 MG (Base Equivalent): ORAL | 30 days supply | Qty: 30 | Fill #2 | Status: AC

## 2020-08-13 ENCOUNTER — Other Ambulatory Visit: Payer: Self-pay

## 2020-08-13 MED FILL — Ezetimibe Tab 10 MG: ORAL | 30 days supply | Qty: 30 | Fill #2 | Status: AC

## 2020-08-14 ENCOUNTER — Ambulatory Visit: Payer: Self-pay | Attending: Family Medicine | Admitting: Family Medicine

## 2020-08-14 ENCOUNTER — Other Ambulatory Visit: Payer: Self-pay

## 2020-08-14 ENCOUNTER — Encounter: Payer: Self-pay | Admitting: Family Medicine

## 2020-08-14 DIAGNOSIS — G720 Drug-induced myopathy: Secondary | ICD-10-CM

## 2020-08-14 DIAGNOSIS — R351 Nocturia: Secondary | ICD-10-CM

## 2020-08-14 DIAGNOSIS — E785 Hyperlipidemia, unspecified: Secondary | ICD-10-CM

## 2020-08-14 DIAGNOSIS — Z794 Long term (current) use of insulin: Secondary | ICD-10-CM

## 2020-08-14 DIAGNOSIS — N401 Enlarged prostate with lower urinary tract symptoms: Secondary | ICD-10-CM

## 2020-08-14 DIAGNOSIS — E1169 Type 2 diabetes mellitus with other specified complication: Secondary | ICD-10-CM

## 2020-08-14 DIAGNOSIS — R21 Rash and other nonspecific skin eruption: Secondary | ICD-10-CM

## 2020-08-14 DIAGNOSIS — Z1211 Encounter for screening for malignant neoplasm of colon: Secondary | ICD-10-CM

## 2020-08-14 DIAGNOSIS — T466X5A Adverse effect of antihyperlipidemic and antiarteriosclerotic drugs, initial encounter: Secondary | ICD-10-CM

## 2020-08-14 DIAGNOSIS — E1142 Type 2 diabetes mellitus with diabetic polyneuropathy: Secondary | ICD-10-CM

## 2020-08-14 MED ORDER — FINASTERIDE 5 MG PO TABS
5.0000 mg | ORAL_TABLET | Freq: Every day | ORAL | 3 refills | Status: DC
  Filled 2020-08-14: qty 30, 30d supply, fill #0
  Filled 2020-09-05: qty 30, 30d supply, fill #1
  Filled 2020-09-29: qty 30, 30d supply, fill #2
  Filled 2020-11-10: qty 30, 30d supply, fill #3

## 2020-08-14 MED ORDER — TRIAMCINOLONE ACETONIDE 0.1 % EX CREA
1.0000 "application " | TOPICAL_CREAM | Freq: Two times a day (BID) | CUTANEOUS | 0 refills | Status: DC
  Filled 2020-08-14: qty 30, 15d supply, fill #0

## 2020-08-14 NOTE — Progress Notes (Signed)
Virtual Visit via Telephone Note  I connected with Dillon Mcgee, on 08/14/2020 at 8:35 AM by telephone due to the COVID-19 pandemic and verified that I am speaking with the correct person using two identifiers.   Consent: I discussed the limitations, risks, security and privacy concerns of performing an evaluation and management service by telephone and the availability of in person appointments. I also discussed with the patient that there may be a patient responsible charge related to this service. The patient expressed understanding and agreed to proceed.   Location of Patient: Work  Biomedical scientist of Provider: Clinic   Persons participating in Telemedicine visit: [employer] Medical Student Dr. Margarita Rana     History of Present Illness: Dillon Mcgee  is a 60 year old male with a history of hypertension, type 2 diabetes mellitus (A1c 8.4), hyperlipidemia, obesity here for follow-up visit.  His lower left leg has been itchy and he scratched it with resulting bleeding which later formed a scab a month ago. Has been applying Gold bond cream. He thinks lesion is getting smaller.  His blood sugars have been - 70,90 ,107 (fasting). He had a 548 one morning after forgetting to take his medication the night before. He has a low sugar of 57 for which he had some juice. The leg cramps have been infrequent. He takes Pravastatin twice a week due to intolerance of statin.  Also on Zetia.  He has not been checking his blood pressure as he has no monitor. Has nocturia 3-4 x/night and had  been placed on Flomax at his last visit which he thinks has been ineffective. Past Medical History:  Diagnosis Date   Diabetes mellitus without complication (HCC)    Hyperlipidemia    Hypertension    Allergies  Allergen Reactions   Pioglitazone Swelling    Lower extremity swelling.    Current Outpatient Medications on File Prior to Visit  Medication Sig Dispense Refill   amLODipine (NORVASC) 5  MG tablet TAKE 1 TABLET (5 MG TOTAL) BY MOUTH DAILY. 90 tablet 1   aspirin EC 81 MG tablet Take 1 tablet (81 mg total) daily by mouth. 90 tablet 3   Blood Glucose Monitoring Suppl (TRUE METRIX GO GLUCOSE METER) w/Device KIT 1 each by Does not apply route every 8 (eight) hours as needed. 1 kit 0   dapagliflozin propanediol (FARXIGA) 10 MG TABS tablet TAKE 1 TABLET (10 MG TOTAL) BY MOUTH DAILY BEFORE BREAKFAST. 30 tablet 6   ezetimibe (ZETIA) 10 MG tablet TAKE 1 TABLET (10 MG TOTAL) BY MOUTH DAILY. 90 tablet 1   ferrous sulfate 325 (65 FE) MG tablet Take 325 mg by mouth daily with breakfast.     gabapentin (NEURONTIN) 300 MG capsule TAKE 1 CAPSULE (300 MG TOTAL) BY MOUTH 3 (THREE) TIMES DAILY. 90 capsule 6   glipiZIDE (GLUCOTROL) 10 MG tablet TAKE 1 TABLET (10 MG TOTAL) BY MOUTH 2 (TWO) TIMES DAILY BEFORE A MEAL. 60 tablet 6   glucose blood test strip USE AS INSTRUCTED 100 strip 12   insulin detemir (LEVEMIR) 100 UNIT/ML injection INJECT 0.35 MLS (35 UNITS TOTAL) INTO THE SKIN 2 (TWO) TIMES DAILY. 30 mL 6   Insulin Syringe-Needle U-100 (TRUEPLUS INSULIN SYRINGE) 30G X 5/16" 0.5 ML MISC USE AS DIRECTED 4 TIMES DAILY 100 each 3   liraglutide (VICTOZA) 18 MG/3ML SOPN INJECT 1.8 MG INTO THE SKIN DAILY. 30 mL 6   meloxicam (MOBIC) 15 MG tablet TAKE 1 TABLET (15 MG TOTAL) BY MOUTH DAILY. TAKE WITH  FOOD 90 tablet 1   metFORMIN (GLUCOPHAGE) 1000 MG tablet TAKE 1 TABLET (1,000 MG TOTAL) BY MOUTH 2 (TWO) TIMES DAILY WITH A MEAL. 60 tablet 6   omega-3 acid ethyl esters (LOVAZA) 1 g capsule TAKE 1 CAPSULE BY MOUTH 2 TIMES DAILY. 180 capsule 0   pravastatin (PRAVACHOL) 20 MG tablet TAKE 1/2 TABLET (10 MG TOTAL) BY MOUTH DAILY. 30 tablet 1   tamsulosin (FLOMAX) 0.4 MG CAPS capsule TAKE 1 CAPSULE (0.4 MG TOTAL) BY MOUTH DAILY. 30 capsule 6   TRUEPLUS LANCETS 26G MISC 1 each by Does not apply route every 8 (eight) hours as needed. 100 each 12   TRUEPLUS PEN NEEDLES 32G X 4 MM MISC USE AS DIRECTED ONCE DAILY 100 each  6   No current facility-administered medications on file prior to visit.    ROS: See HPI  Observations/Objective: Awake, alert, oriented x3 Not in acute distress Normal mood   CMP Latest Ref Rng & Units 02/14/2020 11/15/2019 07/12/2019  Glucose 65 - 99 mg/dL 122(H) 174(H) 174(H)  BUN 6 - 24 mg/dL _0 Creatinine 0.76 - 1.27 mg/dL 1.02 1.05 0.97  Sodium 134 - 144 mmol/L 142 141 145(H)  Potassium 3.5 - 5.2 mmol/L 5.1 5.5(H) 5.1  Chloride 96 - 106 mmol/L 102 102 104  CO2 20 - 29 mmol/L _1 Calcium 8.7 - 10.2 mg/dL 9.2 9.8 8.9  Total Protein 6.0 - 8.5 g/dL - 7.1 6.9  Total Bilirubin 0.0 - 1.2 mg/dL - 0.3 0.4  Alkaline Phos 44 - 121 IU/L - 98 93  AST 0 - 40 IU/L - 39 47(H)  ALT 0 - 44 IU/L - 28 36    Lipid Panel     Component Value Date/Time   CHOL 238 (H) 11/15/2019 0906   TRIG 136 11/15/2019 0906   HDL 47 11/15/2019 0906   CHOLHDL 5.1 (H) 11/15/2019 0906   CHOLHDL 3.5 06/07/2015 1005   VLDL 33 (H) 06/07/2015 1005   LDLCALC 166 (H) 11/15/2019 0906   LABVLDL 25 11/15/2019 0906    Lab Results  Component Value Date   HGBA1C 8.4 (A) 05/15/2020    Assessment and Plan: 1. Type 2 diabetes mellitus with diabetic polyneuropathy, with long-term current use of insulin (HCC) Uncontrolled with A1c of 8.4; goal is less than 7.0 His blood sugar log reveals improvement Will check A1c and adjust regimen accordingly Counseled on Diabetic diet, my plate method, 712 minutes of moderate intensity exercise/week Blood sugar logs with fasting goals of 80-120 mg/dl, random of less than 180 and in the event of sugars less than 60 mg/dl or greater than 400 mg/dl encouraged to notify the clinic. Advised on the need for annual eye exams, annual foot exams, Pneumonia vaccine. - Hemoglobin A1c; Future - Lipid panel; Future - CMP14+EGFR; Future - Microalbumin / creatinine urine ratio; Future  2. Statin myopathy Needs to be on moderate to high intensity dose statin however he is  intolerant Symptoms have improved on low-dose statin   3. Hyperlipidemia associated with type 2 diabetes mellitus ([place]) Uncontrolled Discussed the possibility of PCSK9 due to his increased 10-year ASCVD risk He would like to hold off and check his lipid panel prior to deciding on this  4. Rash Improved No additional treatment needed  5. Benign prostatic hyperplasia with nocturia Uncontrolled on Flomax Proscar added to regimen - finasteride (PROSCAR) 5 MG tablet; Take 1 tablet (5 mg total) by mouth daily.  Dispense: 30 tablet; Refill: 3  6. Screening for colon cancer - Fecal occult blood, imunochemical([facility]); Future   Follow Up Instructions: 3 months   I discussed the assessment and treatment plan with the patient. The patient was provided an opportunity to ask questions and all were answered. The patient agreed with the plan and demonstrated an understanding of the instructions.   The patient was advised to call back or seek an in-person evaluation if the symptoms worsen or if the condition fails to improve as anticipated.     I provided 18 minutes total of non-face-to-face time during this encounter.   Charlott Rakes, MD, FAAFP. [facility] and [place], [place]   08/14/2020, 8:35 AM

## 2020-08-17 ENCOUNTER — Other Ambulatory Visit: Payer: Self-pay

## 2020-08-17 ENCOUNTER — Ambulatory Visit: Payer: Self-pay | Attending: Family Medicine

## 2020-08-17 DIAGNOSIS — E1142 Type 2 diabetes mellitus with diabetic polyneuropathy: Secondary | ICD-10-CM

## 2020-08-18 LAB — CMP14+EGFR
ALT: 16 IU/L (ref 0–44)
AST: 24 IU/L (ref 0–40)
Albumin/Globulin Ratio: 1.7 (ref 1.2–2.2)
Albumin: 4.3 g/dL (ref 3.8–4.9)
Alkaline Phosphatase: 94 IU/L (ref 44–121)
BUN/Creatinine Ratio: 15 (ref 9–20)
BUN: 19 mg/dL (ref 6–24)
Bilirubin Total: 0.3 mg/dL (ref 0.0–1.2)
CO2: 24 mmol/L (ref 20–29)
Calcium: 9.3 mg/dL (ref 8.7–10.2)
Chloride: 106 mmol/L (ref 96–106)
Creatinine, Ser: 1.26 mg/dL (ref 0.76–1.27)
Globulin, Total: 2.6 g/dL (ref 1.5–4.5)
Glucose: 93 mg/dL (ref 65–99)
Potassium: 5 mmol/L (ref 3.5–5.2)
Sodium: 145 mmol/L — ABNORMAL HIGH (ref 134–144)
Total Protein: 6.9 g/dL (ref 6.0–8.5)
eGFR: 66 mL/min/{1.73_m2} (ref >59)

## 2020-08-18 LAB — LIPID PANEL
Chol/HDL Ratio: 4.4 ratio (ref 0.0–5.0)
Cholesterol, Total: 208 mg/dL — ABNORMAL HIGH (ref 100–199)
HDL: 47 mg/dL (ref >39)
LDL Chol Calc (NIH): 130 mg/dL — ABNORMAL HIGH (ref 0–99)
Triglycerides: 174 mg/dL — ABNORMAL HIGH (ref 0–149)
VLDL Cholesterol Cal: 31 mg/dL (ref 5–40)

## 2020-08-18 LAB — MICROALBUMIN / CREATININE URINE RATIO
Creatinine, Urine: 78.5 mg/dL
Microalb/Creat Ratio: 31 mg/g creat — ABNORMAL HIGH (ref 0–29)
Microalbumin, Urine: 24.7 ug/mL

## 2020-08-18 LAB — HEMOGLOBIN A1C
Est. average glucose Bld gHb Est-mCnc: 186 mg/dL
Hgb A1c MFr Bld: 8.1 % — ABNORMAL HIGH (ref 4.8–5.6)

## 2020-08-21 ENCOUNTER — Telehealth: Payer: Self-pay

## 2020-08-21 MED FILL — Meloxicam Tab 15 MG: ORAL | 30 days supply | Qty: 30 | Fill #2 | Status: AC

## 2020-08-21 MED FILL — Glipizide Tab 10 MG: ORAL | 30 days supply | Qty: 60 | Fill #2 | Status: AC

## 2020-08-21 NOTE — Telephone Encounter (Signed)
Patient name and DOB has been verified Patient was informed of lab results. Patient had no questions.  

## 2020-08-21 NOTE — Telephone Encounter (Signed)
-----   Message from Hoy Register, MD sent at 08/20/2020 10:25 AM EDT ----- Please inform him his cholesterol is elevated but has improved compared to previous labs. Other labs are stable. Continue current management. Thanks

## 2020-08-22 ENCOUNTER — Other Ambulatory Visit: Payer: Self-pay

## 2020-08-22 NOTE — Addendum Note (Signed)
Addended byMemory Dance on: 08/22/2020 01:34 PM   Modules accepted: Orders

## 2020-08-23 LAB — FECAL OCCULT BLOOD, IMMUNOCHEMICAL: Fecal Occult Bld: NEGATIVE

## 2020-08-24 ENCOUNTER — Encounter: Payer: Self-pay | Admitting: *Deleted

## 2020-08-26 MED FILL — Tamsulosin HCl Cap 0.4 MG: ORAL | 30 days supply | Qty: 30 | Fill #2 | Status: AC

## 2020-08-26 MED FILL — Pravastatin Sodium Tab 20 MG: ORAL | 30 days supply | Qty: 15 | Fill #1 | Status: AC

## 2020-08-27 ENCOUNTER — Other Ambulatory Visit: Payer: Self-pay

## 2020-08-29 MED FILL — Metformin HCl Tab 1000 MG: ORAL | 30 days supply | Qty: 60 | Fill #2 | Status: AC

## 2020-08-30 ENCOUNTER — Other Ambulatory Visit: Payer: Self-pay

## 2020-09-06 ENCOUNTER — Other Ambulatory Visit: Payer: Self-pay

## 2020-09-17 MED FILL — Gabapentin Cap 300 MG: ORAL | 30 days supply | Qty: 90 | Fill #3 | Status: AC

## 2020-09-17 MED FILL — Ezetimibe Tab 10 MG: ORAL | 30 days supply | Qty: 30 | Fill #3 | Status: AC

## 2020-09-17 MED FILL — Glucose Blood Test Strip: 25 days supply | Qty: 100 | Fill #1 | Status: AC

## 2020-09-17 MED FILL — Amlodipine Besylate Tab 5 MG (Base Equivalent): ORAL | 30 days supply | Qty: 30 | Fill #3 | Status: AC

## 2020-09-18 ENCOUNTER — Other Ambulatory Visit: Payer: Self-pay

## 2020-09-19 ENCOUNTER — Other Ambulatory Visit: Payer: Self-pay

## 2020-09-19 MED FILL — Gabapentin Cap 300 MG: ORAL | 30 days supply | Qty: 90 | Fill #4 | Status: CN

## 2020-09-19 MED FILL — Glucose Blood Test Strip: 25 days supply | Qty: 100 | Fill #2 | Status: CN

## 2020-09-19 MED FILL — Amlodipine Besylate Tab 5 MG (Base Equivalent): ORAL | 30 days supply | Qty: 30 | Fill #4 | Status: CN

## 2020-09-19 MED FILL — Ezetimibe Tab 10 MG: ORAL | 30 days supply | Qty: 30 | Fill #4 | Status: CN

## 2020-09-20 ENCOUNTER — Other Ambulatory Visit: Payer: Self-pay

## 2020-09-22 MED FILL — Meloxicam Tab 15 MG: ORAL | 30 days supply | Qty: 30 | Fill #3 | Status: AC

## 2020-09-22 MED FILL — Glipizide Tab 10 MG: ORAL | 30 days supply | Qty: 60 | Fill #3 | Status: AC

## 2020-09-22 MED FILL — Tamsulosin HCl Cap 0.4 MG: ORAL | 30 days supply | Qty: 30 | Fill #3 | Status: AC

## 2020-09-24 ENCOUNTER — Other Ambulatory Visit: Payer: Self-pay

## 2020-09-29 ENCOUNTER — Other Ambulatory Visit: Payer: Self-pay | Admitting: Family Medicine

## 2020-09-29 DIAGNOSIS — E1169 Type 2 diabetes mellitus with other specified complication: Secondary | ICD-10-CM

## 2020-09-29 MED ORDER — OMEGA-3-ACID ETHYL ESTERS 1 G PO CAPS
1.0000 | ORAL_CAPSULE | Freq: Two times a day (BID) | ORAL | 0 refills | Status: DC
  Filled 2020-09-29: qty 60, 30d supply, fill #0
  Filled 2020-11-01: qty 60, 30d supply, fill #1
  Filled 2020-11-29: qty 60, 30d supply, fill #2

## 2020-09-29 MED FILL — Metformin HCl Tab 1000 MG: ORAL | 30 days supply | Qty: 60 | Fill #3 | Status: AC

## 2020-09-29 NOTE — Telephone Encounter (Signed)
Requested Prescriptions  Pending Prescriptions Disp Refills  . omega-3 acid ethyl esters (LOVAZA) 1 g capsule 180 capsule 0    Sig: TAKE 1 CAPSULE BY MOUTH 2 TIMES DAILY.     Endocrinology:  Nutritional Agents Passed - 09/29/2020  6:32 PM      Passed - Valid encounter within last 12 months    Recent Outpatient Visits          1 month ago Type 2 diabetes mellitus with diabetic polyneuropathy, with long-term current use of insulin (HCC)   [facility] [place], [place], MD   4 months ago Type 2 diabetes mellitus with diabetic polyneuropathy, with long-term current use of insulin (HCC)   [facility] [place], [place], MD   7 months ago Type 2 diabetes mellitus with diabetic polyneuropathy, with long-term current use of insulin (HCC)   [facility] [place], Peter, MD   10 months ago Type 2 diabetes mellitus with diabetic polyneuropathy, with long-term current use of insulin (HCC)   [facility] [place], [place], MD   1 year ago Type 2 diabetes mellitus with diabetic polyneuropathy, with long-term current use of insulin (HCC)    [facility] Hoy Register, MD      Future Appointments            In 1 month Hoy Register, MD [facility]

## 2020-10-01 ENCOUNTER — Other Ambulatory Visit: Payer: Self-pay

## 2020-10-05 ENCOUNTER — Other Ambulatory Visit: Payer: Self-pay

## 2020-10-08 ENCOUNTER — Other Ambulatory Visit: Payer: Self-pay

## 2020-10-09 ENCOUNTER — Other Ambulatory Visit: Payer: Self-pay

## 2020-10-18 ENCOUNTER — Other Ambulatory Visit: Payer: Self-pay

## 2020-10-18 MED FILL — Ezetimibe Tab 10 MG: ORAL | 30 days supply | Qty: 30 | Fill #4 | Status: AC

## 2020-10-18 MED FILL — Liraglutide Soln Pen-injector 18 MG/3ML (6 MG/ML): SUBCUTANEOUS | 90 days supply | Qty: 27 | Fill #2 | Status: AC

## 2020-10-18 MED FILL — Insulin Detemir Inj 100 Unit/ML: SUBCUTANEOUS | 85 days supply | Qty: 60 | Fill #2 | Status: AC

## 2020-10-18 MED FILL — Gabapentin Cap 300 MG: ORAL | 30 days supply | Qty: 90 | Fill #4 | Status: AC

## 2020-10-18 MED FILL — Amlodipine Besylate Tab 5 MG (Base Equivalent): ORAL | 30 days supply | Qty: 30 | Fill #4 | Status: AC

## 2020-10-19 ENCOUNTER — Other Ambulatory Visit: Payer: Self-pay

## 2020-10-22 ENCOUNTER — Other Ambulatory Visit: Payer: Self-pay

## 2020-10-25 MED FILL — Tamsulosin HCl Cap 0.4 MG: ORAL | 30 days supply | Qty: 30 | Fill #4 | Status: AC

## 2020-10-25 MED FILL — Glipizide Tab 10 MG: ORAL | 30 days supply | Qty: 60 | Fill #4 | Status: AC

## 2020-10-25 MED FILL — Meloxicam Tab 15 MG: ORAL | 30 days supply | Qty: 30 | Fill #4 | Status: AC

## 2020-10-26 ENCOUNTER — Other Ambulatory Visit: Payer: Self-pay

## 2020-11-01 MED FILL — Tamsulosin HCl Cap 0.4 MG: ORAL | 30 days supply | Qty: 30 | Fill #5 | Status: AC

## 2020-11-01 MED FILL — Metformin HCl Tab 1000 MG: ORAL | 30 days supply | Qty: 60 | Fill #4 | Status: AC

## 2020-11-02 ENCOUNTER — Other Ambulatory Visit: Payer: Self-pay

## 2020-11-06 ENCOUNTER — Other Ambulatory Visit: Payer: Self-pay

## 2020-11-12 ENCOUNTER — Other Ambulatory Visit: Payer: Self-pay

## 2020-11-22 ENCOUNTER — Other Ambulatory Visit: Payer: Self-pay

## 2020-11-22 MED FILL — Ezetimibe Tab 10 MG: ORAL | 30 days supply | Qty: 30 | Fill #5 | Status: AC

## 2020-11-22 MED FILL — Gabapentin Cap 300 MG: ORAL | 30 days supply | Qty: 90 | Fill #5 | Status: AC

## 2020-11-22 MED FILL — Amlodipine Besylate Tab 5 MG (Base Equivalent): ORAL | 30 days supply | Qty: 30 | Fill #5 | Status: AC

## 2020-11-23 ENCOUNTER — Other Ambulatory Visit: Payer: Self-pay

## 2020-11-25 ENCOUNTER — Other Ambulatory Visit: Payer: Self-pay | Admitting: Family Medicine

## 2020-11-25 MED FILL — Glipizide Tab 10 MG: ORAL | 30 days supply | Qty: 60 | Fill #5 | Status: AC

## 2020-11-26 ENCOUNTER — Other Ambulatory Visit: Payer: Self-pay

## 2020-11-26 MED ORDER — MELOXICAM 15 MG PO TABS
15.0000 mg | ORAL_TABLET | Freq: Every day | ORAL | 0 refills | Status: DC
  Filled 2020-11-26: qty 30, 30d supply, fill #0

## 2020-11-27 ENCOUNTER — Encounter: Payer: Self-pay | Admitting: Family Medicine

## 2020-11-27 ENCOUNTER — Ambulatory Visit: Payer: Self-pay | Attending: Family Medicine | Admitting: Family Medicine

## 2020-11-27 ENCOUNTER — Other Ambulatory Visit: Payer: Self-pay

## 2020-11-27 VITALS — BP 127/70 | HR 67 | Ht 76.0 in | Wt 354.8 lb

## 2020-11-27 DIAGNOSIS — E1142 Type 2 diabetes mellitus with diabetic polyneuropathy: Secondary | ICD-10-CM

## 2020-11-27 DIAGNOSIS — Z794 Long term (current) use of insulin: Secondary | ICD-10-CM

## 2020-11-27 DIAGNOSIS — I8312 Varicose veins of left lower extremity with inflammation: Secondary | ICD-10-CM

## 2020-11-27 DIAGNOSIS — N401 Enlarged prostate with lower urinary tract symptoms: Secondary | ICD-10-CM

## 2020-11-27 DIAGNOSIS — I10 Essential (primary) hypertension: Secondary | ICD-10-CM

## 2020-11-27 DIAGNOSIS — G720 Drug-induced myopathy: Secondary | ICD-10-CM

## 2020-11-27 DIAGNOSIS — Z23 Encounter for immunization: Secondary | ICD-10-CM

## 2020-11-27 DIAGNOSIS — L409 Psoriasis, unspecified: Secondary | ICD-10-CM

## 2020-11-27 DIAGNOSIS — E7800 Pure hypercholesterolemia, unspecified: Secondary | ICD-10-CM

## 2020-11-27 DIAGNOSIS — R351 Nocturia: Secondary | ICD-10-CM

## 2020-11-27 LAB — POCT GLYCOSYLATED HEMOGLOBIN (HGB A1C): HbA1c, POC (controlled diabetic range): 8.1 % — AB (ref 0.0–7.0)

## 2020-11-27 LAB — GLUCOSE, POCT (MANUAL RESULT ENTRY): POC Glucose: 109 mg/dl — AB (ref 70–99)

## 2020-11-27 MED ORDER — AMLODIPINE BESYLATE 5 MG PO TABS
ORAL_TABLET | Freq: Every day | ORAL | 1 refills | Status: DC
  Filled 2020-11-27: qty 90, fill #0
  Filled 2020-12-18: qty 30, 30d supply, fill #0
  Filled 2021-01-19: qty 90, 90d supply, fill #1

## 2020-11-27 MED ORDER — PRAVASTATIN SODIUM 20 MG PO TABS
ORAL_TABLET | ORAL | 1 refills | Status: DC
  Filled 2020-11-27: qty 15, 30d supply, fill #0

## 2020-11-27 MED ORDER — CALCIPOTRIENE-BETAMETH DIPROP 0.005-0.064 % EX OINT
TOPICAL_OINTMENT | Freq: Every day | CUTANEOUS | 1 refills | Status: AC
  Filled 2020-11-27: qty 100, 30d supply, fill #0

## 2020-11-27 MED ORDER — MELOXICAM 15 MG PO TABS
15.0000 mg | ORAL_TABLET | Freq: Every day | ORAL | 0 refills | Status: DC
Start: 2020-11-27 — End: 2021-01-26
  Filled 2020-12-22: qty 30, 30d supply, fill #0

## 2020-11-27 MED ORDER — GLUCOSE BLOOD VI STRP
ORAL_STRIP | 12 refills | Status: DC
  Filled 2020-11-27: qty 100, 33d supply, fill #0
  Filled 2021-02-09: qty 100, 33d supply, fill #1
  Filled 2021-04-11: qty 100, 33d supply, fill #0
  Filled 2021-07-11: qty 100, 33d supply, fill #1
  Filled 2021-11-15: qty 100, 33d supply, fill #2

## 2020-11-27 MED ORDER — TRULICITY 1.5 MG/0.5ML ~~LOC~~ SOAJ
1.5000 mg | SUBCUTANEOUS | 0 refills | Status: DC
  Filled 2020-11-27: qty 2, 28d supply, fill #0

## 2020-11-27 MED ORDER — FINASTERIDE 5 MG PO TABS
5.0000 mg | ORAL_TABLET | Freq: Every day | ORAL | 3 refills | Status: DC
  Filled 2020-11-27 – 2020-12-08 (×2): qty 30, 30d supply, fill #0
  Filled 2021-01-06: qty 30, 30d supply, fill #1
  Filled 2021-02-09: qty 30, 30d supply, fill #2
  Filled 2021-03-09: qty 30, 30d supply, fill #3
  Filled 2021-03-11: qty 30, 30d supply, fill #0

## 2020-11-27 NOTE — Progress Notes (Signed)
Having muscle tightness in left leg.  Frequent urination.

## 2020-11-27 NOTE — Patient Instructions (Signed)
Once you pick up Trulicity from the pharmacy, begin administering once a week and discontinue Victoza. Follow-up with the clinical pharmacist in 1 month to increase your Trulicity dose.

## 2020-11-27 NOTE — Progress Notes (Signed)
Subjective:  Patient ID: Dillon Mcgee, male    DOB: [birth year]  Age: 60 y.o. MRN: [id number]  CC: Diabetes   HPI Dillon Mcgee is a 60 y.o. year old male with a history of hypertension, type 2 diabetes mellitus (A1c 8.1), hyperlipidemia, obesity here for follow-up visit.  Interval History: He has muscle tightness in his thighs. He has to stretch for relief as this prevents his moving when the muscles are tight. This was previously thought to be secondary to his statin and he has been taking pravastatin 58m 2x/week. He still has urinary frequency. Has to use the bathroom right after drinking.  He is currently on Flomax and finasteride.  He has a rash on both legs.  One is on his left shin superior to his varicose veins and is pruritic and the other is on the lateral aspect of his right leg.  He has triamcinolone cream which has been beneficial with regards to the pruritus.  His A1c is 8.1 down from 8.4 previously and blood sugar logs have ranged from fasting sugars of 88-150.  He has no hypoglycemic symptoms.  Neuropathy is controlled on gabapentin.  He has no visual concerns.  Past Medical History:  Diagnosis Date   Diabetes mellitus without complication ([place]    Hyperlipidemia    Hypertension     Past Surgical History:  Procedure Laterality Date   COLONOSCOPY     5 years ago     Family History  Problem Relation Age of Onset   Heart disease Maternal Aunt    Diabetes Maternal Uncle     Allergies  Allergen Reactions   Pioglitazone Swelling    Lower extremity swelling.    Outpatient Medications Prior to Visit  Medication Sig Dispense Refill   aspirin EC 81 MG tablet Take 1 tablet (81 mg total) daily by mouth. 90 tablet 3   Blood Glucose Monitoring Suppl (TRUE METRIX GO GLUCOSE METER) w/Device KIT 1 each by Does not apply route every 8 (eight) hours as needed. 1 kit 0   dapagliflozin propanediol (FARXIGA) 10 MG TABS tablet TAKE 1 TABLET (10 MG TOTAL) BY MOUTH DAILY  BEFORE BREAKFAST. 30 tablet 6   ezetimibe (ZETIA) 10 MG tablet TAKE 1 TABLET (10 MG TOTAL) BY MOUTH DAILY. 90 tablet 1   ferrous sulfate 325 (65 FE) MG tablet Take 325 mg by mouth daily with breakfast.     gabapentin (NEURONTIN) 300 MG capsule TAKE 1 CAPSULE (300 MG TOTAL) BY MOUTH 3 (THREE) TIMES DAILY. 90 capsule 6   glipiZIDE (GLUCOTROL) 10 MG tablet TAKE 1 TABLET (10 MG TOTAL) BY MOUTH 2 (TWO) TIMES DAILY BEFORE A MEAL. 60 tablet 6   insulin detemir (LEVEMIR) 100 UNIT/ML injection INJECT 0.35 MLS (35 UNITS TOTAL) INTO THE SKIN 2 (TWO) TIMES DAILY. 30 mL 6   Insulin Syringe-Needle U-100 (TRUEPLUS INSULIN SYRINGE) 30G X 5/16" 0.5 ML MISC USE AS DIRECTED 4 TIMES DAILY 100 each 3   metFORMIN (GLUCOPHAGE) 1000 MG tablet TAKE 1 TABLET (1,000 MG TOTAL) BY MOUTH 2 (TWO) TIMES DAILY WITH A MEAL. 60 tablet 6   omega-3 acid ethyl esters (LOVAZA) 1 g capsule TAKE 1 CAPSULE BY MOUTH 2 TIMES DAILY. 180 capsule 0   tamsulosin (FLOMAX) 0.4 MG CAPS capsule TAKE 1 CAPSULE (0.4 MG TOTAL) BY MOUTH DAILY. 30 capsule 6   TRUEPLUS LANCETS 26G MISC 1 each by Does not apply route every 8 (eight) hours as needed. 100 each 12   TRUEPLUS PEN NEEDLES 32G X  4 MM MISC USE AS DIRECTED ONCE DAILY 100 each 6   amLODipine (NORVASC) 5 MG tablet TAKE 1 TABLET (5 MG TOTAL) BY MOUTH DAILY. 90 tablet 1   finasteride (PROSCAR) 5 MG tablet Take 1 tablet (5 mg total) by mouth daily. 30 tablet 3   glucose blood test strip USE AS INSTRUCTED 100 strip 12   liraglutide (VICTOZA) 18 MG/3ML SOPN INJECT 1.8 MG INTO THE SKIN DAILY. 30 mL 6   meloxicam (MOBIC) 15 MG tablet Take 1 tablet (15 mg total) by mouth daily. 30 tablet 0   pravastatin (PRAVACHOL) 20 MG tablet TAKE 1/2 TABLET (10 MG TOTAL) BY MOUTH DAILY. 30 tablet 1   triamcinolone cream (KENALOG) 0.1 % Apply 1 application topically 2 (two) times daily. 30 g 0   No facility-administered medications prior to visit.     ROS Review of Systems  Constitutional:  Negative for activity  change and appetite change.  HENT:  Negative for sinus pressure and sore throat.   Eyes:  Negative for visual disturbance.  Respiratory:  Negative for cough, chest tightness and shortness of breath.   Cardiovascular:  Negative for chest pain and leg swelling.  Gastrointestinal:  Negative for abdominal distention, abdominal pain, constipation and diarrhea.  Endocrine: Negative.   Genitourinary:  Negative for dysuria.  Musculoskeletal:  Positive for myalgias. Negative for joint swelling.  Skin:  Positive for rash.  Allergic/Immunologic: Negative.   Neurological:  Negative for weakness, light-headedness and numbness.  Psychiatric/Behavioral:  Negative for dysphoric mood and suicidal ideas.    Objective:  BP 127/70   Pulse 67   Ht 6' 4" (1.93 m)   Wt (!) 354 lb 12.8 oz (160.9 kg)   SpO2 98%   BMI 43.19 kg/m   BP/Weight 11/27/2020 05/15/2020 16/12/9602  Systolic BP 540 981 191  Diastolic BP 70 73 71  Wt. (Lbs) 354.8 369 371  BMI 43.19 46.12 45.16      Physical Exam Constitutional:      Appearance: He is well-developed. He is obese.  Cardiovascular:     Rate and Rhythm: Normal rate.     Heart sounds: Normal heart sounds. No murmur heard. Pulmonary:     Effort: Pulmonary effort is normal.     Breath sounds: Normal breath sounds. No wheezing or rales.  Chest:     Chest wall: No tenderness.  Abdominal:     General: Bowel sounds are normal. There is no distension.     Palpations: Abdomen is soft. There is no mass.     Tenderness: There is no abdominal tenderness.  Musculoskeletal:        General: Normal range of motion.     Right lower leg: No edema.     Left lower leg: No edema.  Skin:    Comments: Varicose veins on left shin with erythematous rash. Lateral aspect of R leg with large plaques which are erythematous  Neurological:     Mental Status: He is alert and oriented to person, place, and time.  Psychiatric:        Mood and Affect: Mood normal.        CMP  Latest Ref Rng & Units 08/17/2020 02/14/2020 11/15/2019  Glucose 65 - 99 mg/dL 93 122(H) 174(H)  BUN 6 - 24 mg/dL _0 Creatinine 0.76 - 1.27 mg/dL 1.26 1.02 1.05  Sodium 134 - 144 mmol/L 145(H) 142 141  Potassium 3.5 - 5.2 mmol/L 5.0 5.1 5.5(H)  Chloride 96 - 106 mmol/L 106  102 102  CO2 20 - 29 mmol/L _0 Calcium 8.7 - 10.2 mg/dL 9.3 9.2 9.8  Total Protein 6.0 - 8.5 g/dL 6.9 - 7.1  Total Bilirubin 0.0 - 1.2 mg/dL 0.3 - 0.3  Alkaline Phos 44 - 121 IU/L 94 - 98  AST 0 - 40 IU/L 24 - 39  ALT 0 - 44 IU/L 16 - 28    Lipid Panel     Component Value Date/Time   CHOL 208 (H) 08/17/2020 0937   TRIG 174 (H) 08/17/2020 0937   HDL 47 08/17/2020 0937   CHOLHDL 4.4 08/17/2020 0937   CHOLHDL 3.5 06/07/2015 1005   VLDL 33 (H) 06/07/2015 1005   LDLCALC 130 (H) 08/17/2020 0937    CBC    Component Value Date/Time   WBC 7.4 12/03/2015 1704   RBC 4.17 (L) 12/03/2015 1704   HGB 12.6 (L) 12/03/2015 1704   HCT 38.9 12/03/2015 1704   PLT 288 12/03/2015 1704   MCV 93.3 12/03/2015 1704   MCH 30.2 12/03/2015 1704   MCHC 32.4 12/03/2015 1704   RDW 13.1 12/03/2015 1704   LYMPHSABS 2,294 12/03/2015 1704   MONOABS 518 12/03/2015 1704   EOSABS 148 12/03/2015 1704   BASOSABS 74 12/03/2015 1704    Lab Results  Component Value Date   HGBA1C 8.1 (A) 11/27/2020    Assessment & Plan:  1. Type 2 diabetes mellitus with diabetic polyneuropathy, with long-term current use of insulin (HCC) Uncontrolled with A1c of 8.1; goal is less than 7.0 Fasting sugars are close to goal but I do not have his random sugars Have switched to Trulicity from Victoza with plans to titrate Trulicity at his clinical pharmacy visit to prevent him from being confused He will bring his blood sugar log along with fasting and random sugars for review at that visit Counseled on Diabetic diet, my plate method, 086 minutes of moderate intensity exercise/week Blood sugar logs with fasting goals of 80-120 mg/dl, random of  less than 180 and in the event of sugars less than 60 mg/dl or greater than 400 mg/dl encouraged to notify the clinic. Advised on the need for annual eye exams, annual foot exams, Pneumonia vaccine. - POCT glucose (manual entry) - POCT glycosylated hemoglobin (Hb A1C) - Dulaglutide (TRULICITY) 1.5 VH/8.4ON SOPN; Inject 1.5 mg into the skin once a week. Discontinue Victoza  Dispense: 2 mL; Refill: 0 - glucose blood test strip; Use as instructed 3 times daily.  Dispense: 100 strip; Refill: 12  2. Essential hypertension Controlled Counseled on blood pressure goal of less than 130/80, low-sodium, DASH diet, medication compliance, 150 minutes of moderate intensity exercise per week. Discussed medication compliance, adverse effects. - amLODipine (NORVASC) 5 MG tablet; TAKE 1 TABLET (5 MG TOTAL) BY MOUTH DAILY.  Dispense: 90 tablet; Refill: 1  3. Benign prostatic hyperplasia with nocturia Uncontrolled Would love to refer to urology however he declines due to financial constraints Continue Flomax and Proscar - finasteride (PROSCAR) 5 MG tablet; Take 1 tablet (5 mg total) by mouth daily.  Dispense: 30 tablet; Refill: 3  4. Pure hypercholesterolemia Uncontrolled He does have statin intolerance Currently on pravastatin twice a week - pravastatin (PRAVACHOL) 20 MG tablet; TAKE 1/2 TABLET (10 MG TOTAL) BY MOUTH DAILY.  Dispense: 30 tablet; Refill: 1  5. Statin myopathy See #4 above  6. Psoriasis Uncontrolled We will switch from triamcinolone to Taclonex Ideally would have him see a dermatologist but again he declines referral due to financial constraints -  calcipotriene-betamethasone (TACLONEX) ointment; Apply ointment to affected area(s) daily.  Dispense: 100 g; Refill: 1  7. Varicose veins of left lower extremity with inflammation He does have superimposed dermatitis on his varicose veins Use compression stockings   Meds ordered this encounter  Medications   Dulaglutide (TRULICITY) 1.5  ON/6.2XB SOPN    Sig: Inject 1.5 mg into the skin once a week. Discontinue Victoza    Dispense:  2 mL    Refill:  0    After 1 month needs to increase to 3.0 mg/week   amLODipine (NORVASC) 5 MG tablet    Sig: TAKE 1 TABLET (5 MG TOTAL) BY MOUTH DAILY.    Dispense:  90 tablet    Refill:  1   finasteride (PROSCAR) 5 MG tablet    Sig: Take 1 tablet (5 mg total) by mouth daily.    Dispense:  30 tablet    Refill:  3   meloxicam (MOBIC) 15 MG tablet    Sig: Take 1 tablet (15 mg total) by mouth daily.    Dispense:  30 tablet    Refill:  0   pravastatin (PRAVACHOL) 20 MG tablet    Sig: TAKE 1/2 TABLET (10 MG TOTAL) BY MOUTH DAILY.    Dispense:  30 tablet    Refill:  1   calcipotriene-betamethasone (TACLONEX) ointment    Sig: Apply ointment to affected area(s) daily.    Dispense:  100 g    Refill:  1   glucose blood test strip    Sig: Use as instructed 3 times daily.    Dispense:  100 strip    Refill:  12     Follow-up: Return in about 1 month (around 12/27/2020) for Clinical pharmacist for Trulicity titration; PCP 4 months.       Charlott Rakes, MD, FAAFP. [facility] [place], [place]   11/27/2020, 9:17 AM

## 2020-11-28 ENCOUNTER — Other Ambulatory Visit: Payer: Self-pay

## 2020-11-29 MED FILL — Metformin HCl Tab 1000 MG: ORAL | 30 days supply | Qty: 60 | Fill #5 | Status: AC

## 2020-11-30 ENCOUNTER — Other Ambulatory Visit: Payer: Self-pay

## 2020-11-30 ENCOUNTER — Other Ambulatory Visit: Payer: Self-pay | Admitting: Family Medicine

## 2020-11-30 DIAGNOSIS — E785 Hyperlipidemia, unspecified: Secondary | ICD-10-CM

## 2020-11-30 DIAGNOSIS — E1169 Type 2 diabetes mellitus with other specified complication: Secondary | ICD-10-CM

## 2020-11-30 MED FILL — Metformin HCl Tab 1000 MG: ORAL | 30 days supply | Qty: 60 | Fill #6 | Status: CN

## 2020-12-01 NOTE — Telephone Encounter (Signed)
Requested medications are due for refill today No, just filled 11/30/20  Requested medications are on the active medication list yes  Last refill 11/30/20  Last visit 11/27/20  Future visit scheduled 04/02/21  Notes to clinic already filled recently. Please assess.

## 2020-12-03 ENCOUNTER — Other Ambulatory Visit: Payer: Self-pay

## 2020-12-03 MED ORDER — OMEGA-3-ACID ETHYL ESTERS 1 G PO CAPS
1.0000 | ORAL_CAPSULE | Freq: Two times a day (BID) | ORAL | 2 refills | Status: DC
  Filled 2020-12-03 – 2020-12-29 (×2): qty 60, 30d supply, fill #0
  Filled 2021-02-01: qty 60, 30d supply, fill #1
  Filled 2021-03-02: qty 60, 30d supply, fill #2

## 2020-12-10 ENCOUNTER — Other Ambulatory Visit: Payer: Self-pay

## 2020-12-18 ENCOUNTER — Other Ambulatory Visit: Payer: Self-pay | Admitting: Family Medicine

## 2020-12-18 ENCOUNTER — Other Ambulatory Visit: Payer: Self-pay

## 2020-12-18 DIAGNOSIS — E1142 Type 2 diabetes mellitus with diabetic polyneuropathy: Secondary | ICD-10-CM

## 2020-12-18 DIAGNOSIS — Z794 Long term (current) use of insulin: Secondary | ICD-10-CM

## 2020-12-18 DIAGNOSIS — E7800 Pure hypercholesterolemia, unspecified: Secondary | ICD-10-CM

## 2020-12-18 NOTE — Telephone Encounter (Signed)
Requested medication (s) are due for refill today: Yes  Requested medication (s) are on the active medication list: Yes  Last refill:  Zetia  - 05/15/20  Trulicity - 11/27/20  Future visit scheduled: Yes  Notes to clinic:  Protocol indicates pt. Needs lab work.    Requested Prescriptions  Pending Prescriptions Disp Refills   ezetimibe (ZETIA) 10 MG tablet 90 tablet 1    Sig: TAKE 1 TABLET (10 MG TOTAL) BY MOUTH DAILY.     Cardiovascular:  Antilipid - Sterol Transport Inhibitors Failed - 12/18/2020  8:19 AM      Failed - Total Cholesterol in normal range and within 360 days    Cholesterol, Total  Date Value Ref Range Status  08/17/2020 208 (H) 100 - 199 mg/dL Final          Failed - LDL in normal range and within 360 days    LDL Chol Calc (NIH)  Date Value Ref Range Status  08/17/2020 130 (H) 0 - 99 mg/dL Final          Failed - Triglycerides in normal range and within 360 days    Triglycerides  Date Value Ref Range Status  08/17/2020 174 (H) 0 - 149 mg/dL Final          Passed - HDL in normal range and within 360 days    HDL  Date Value Ref Range Status  08/17/2020 47 >39 mg/dL Final          Passed - Valid encounter within last 12 months    Recent Outpatient Visits           3 weeks ago Type 2 diabetes mellitus with diabetic polyneuropathy, with long-term current use of insulin (HCC)   [facility] [place], [place], MD   4 months ago Type 2 diabetes mellitus with diabetic polyneuropathy, with long-term current use of insulin (HCC)   [facility] [place], [place], MD   7 months ago Type 2 diabetes mellitus with diabetic polyneuropathy, with long-term current use of insulin (HCC)   [facility] [place], [place], MD   10 months ago Type 2 diabetes mellitus with diabetic polyneuropathy, with long-term current use of insulin (HCC)   [facility] [place],  [place], MD   1 year ago Type 2 diabetes mellitus with diabetic polyneuropathy, with long-term current use of insulin (HCC)   [facility] Hoy Register, MD       Future Appointments             In 1 week Lois Huxley, Cornelius Moras, RPH-CPP [facility]   In 3 months [place], Keyser, MD [facility]             Dulaglutide (TRULICITY) 1.5 MG/0.5ML SOPN 2 mL 0    Sig: Inject 1.5 mg into the skin once a week. Discontinue Victoza     Endocrinology:  Diabetes - GLP-1 Receptor Agonists Failed - 12/18/2020  8:19 AM      Failed - HBA1C is between 0 and 7.9 and within 180 days    HbA1c, POC (controlled diabetic range)  Date Value Ref Range Status  11/27/2020 8.1 (A) 0.0 - 7.0 % Final          Passed - Valid encounter within last 6 months    Recent Outpatient Visits  3 weeks ago Type 2 diabetes mellitus with diabetic polyneuropathy, with long-term current use of insulin (HCC)   [facility] [place], Kendrick, MD   4 months ago Type 2 diabetes mellitus with diabetic polyneuropathy, with long-term current use of insulin (HCC)   [facility] [place], [place], MD   7 months ago Type 2 diabetes mellitus with diabetic polyneuropathy, with long-term current use of insulin (HCC)   [facility] [place], [place], MD   10 months ago Type 2 diabetes mellitus with diabetic polyneuropathy, with long-term current use of insulin (HCC)   [facility] [place], [place], MD   1 year ago Type 2 diabetes mellitus with diabetic polyneuropathy, with long-term current use of insulin [facility])   [facility] Hoy Register, MD       Future Appointments             In 1 week Lois Huxley, Cornelius Moras, RPH-CPP [facility]   In 3 months Hoy Register, MD [facility]

## 2020-12-19 ENCOUNTER — Other Ambulatory Visit: Payer: Self-pay

## 2020-12-19 MED ORDER — TRULICITY 3 MG/0.5ML ~~LOC~~ SOAJ
3.0000 mg | SUBCUTANEOUS | 1 refills | Status: DC
Start: 2020-12-19 — End: 2021-01-15
  Filled 2020-12-19: qty 2, 28d supply, fill #0
  Filled 2021-01-13: qty 2, 28d supply, fill #1

## 2020-12-19 MED ORDER — EZETIMIBE 10 MG PO TABS
ORAL_TABLET | Freq: Every day | ORAL | 1 refills | Status: DC
  Filled 2020-12-19: qty 30, 30d supply, fill #0
  Filled 2021-01-19: qty 30, 30d supply, fill #1
  Filled 2021-02-14: qty 30, 30d supply, fill #2
  Filled 2021-03-28: qty 30, 30d supply, fill #0
  Filled 2021-03-28: qty 30, 30d supply, fill #3

## 2020-12-20 ENCOUNTER — Other Ambulatory Visit: Payer: Self-pay

## 2020-12-22 MED FILL — Gabapentin Cap 300 MG: ORAL | 30 days supply | Qty: 90 | Fill #6 | Status: AC

## 2020-12-24 ENCOUNTER — Other Ambulatory Visit: Payer: Self-pay

## 2020-12-25 ENCOUNTER — Ambulatory Visit: Payer: Self-pay | Attending: Family Medicine | Admitting: Pharmacist

## 2020-12-25 ENCOUNTER — Other Ambulatory Visit: Payer: Self-pay

## 2020-12-25 DIAGNOSIS — E1142 Type 2 diabetes mellitus with diabetic polyneuropathy: Secondary | ICD-10-CM

## 2020-12-25 DIAGNOSIS — Z794 Long term (current) use of insulin: Secondary | ICD-10-CM

## 2020-12-25 MED ORDER — GLIPIZIDE 10 MG PO TABS
ORAL_TABLET | ORAL | 2 refills | Status: DC
  Filled 2020-12-25: qty 45, 30d supply, fill #0
  Filled 2021-01-26: qty 45, 30d supply, fill #1
  Filled 2021-02-21: qty 45, 30d supply, fill #2

## 2020-12-25 NOTE — Progress Notes (Signed)
S:    PCP: Dr. Alvis Lemmings   Patient arrives in good spirits.  Presents for diabetes evaluation, education, and management.  Patient was referred and last seen by Primary Care Provider (Dr. Alvis Lemmings) on 11/27/2020. Per last PCP visit, Trulicity was titrated up from 1.5 mg weekly to 3 mg weekly.   Today (12/25/2020), patient reports tolerating well. He denies any abdominal pain, nausea or vomiting. Medication adherence reported. Patient denies any missed doses in the last 2 weeks.  Patient reports checking blood sugars twice daily at home with a range of (85-300).  Current diabetes medications include: farxiga 10 mg daily, glipizide 10 mg BID, Trulicity 3 mg weekly, metformin 1000 mg BID, Levemir 35 units BID Current hypertension medications include: amlodipine 5 mg daily Current hyperlipidemia medications include: pravastatin 10 mg Tuesday and Friday   Family/Social History: Family Hx: none Social Hx: denies tobacco use, and smokeless tobacco use  Insurance coverage/medication affordability: self-pay  Patient reports hypoglycemic events. Reports objective values in the 60s that occur in the morning. Treats successfully.   Patient reported dietary habits: Eats 3 meals/day. Patient reports eating lots of vegetables, grilled chicken, tuna, beans. He reports snacking with trial mix, peanuts, and popcorn. Patient states drinking more water, and low sugar/low calorie drinks.  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 Patient reports self foot exams.    O:  POCT: 84  Lab Results  Component Value Date   HGBA1C 8.1 (A) 11/27/2020   There were no vitals filed for this visit.  Lipid Panel     Component Value Date/Time   CHOL 208 (H)  08/17/2020 0937   TRIG 174 (H) 08/17/2020 0937   HDL 47 08/17/2020 0937   CHOLHDL 4.4 08/17/2020 0937   CHOLHDL 3.5 06/07/2015 1005   VLDL 33 (H) 06/07/2015 1005   LDLCALC 130 (H) 08/17/2020 0937    Clinical Atherosclerotic Cardiovascular Disease (ASCVD): Yes  The 10-year ASCVD risk score (Arnett DK, et al., 2019) is: 16.9%   Values used to calculate the score:     Age: 60 years     Sex: Male     Is Non-Hispanic African American: No     Diabetic: Yes     Tobacco smoker: No     Systolic Blood Pressure: 127 mmHg     Is BP treated: Yes     HDL Cholesterol: 47 mg/dL     Total Cholesterol: 208 mg/dL   A/P: Diabetes Goals: To have fasting blood glucose 80-130 mg/dL, 2H-PBG < 202 mg/dL and R4Y < 7%.  Diabetes longstanding currently uncontrolled. Patient reports hypoglycemic events and is able to verbalize appropriate hypoglycemia management plan. Medication adherence reported. Patient denies any missed doses in the last 2 weeks.  Patient reports checking blood sugars twice daily at home with a range of (85-300). Per last PCP visit, Trulicity was increased from 1.5 mg weekly to 3 mg weekly. Patient reports tolerating well, denies any abdominal pain, nausea or vomiting. Due to occasional hypoglycemic events will reduce glipizide dose today - Decrease Glipizide: Take 1 tablet (10 mg) in the morning and half tablet (5 mg) in the evening - Extensively discussed pathophysiology of diabetes, recommended lifestyle interventions, dietary effects on blood sugar control - Counseled on s/sx of and management of hypoglycemia - Advised patient to call  the clinic if he does experience any sudden side effects such as abdominal pain, nausea, vomiting - Next A1C anticipated 01/2021.   Follow up with Franky Macho on 01/15/2021 - for hypoglycemic events and Trulicity 3 mg tolerability Follow up with Dr. Alvis Lemmings on 04/02/2021  Written patient instructions provided.  Total time in face to face counseling 30 minutes.     Patient seen by:  Johnston Ebbs, Student Pharmacist [employer] of 2023  Butch Penny, PharmD, [place], CPP Clinical Pharmacist [facility] [phone]

## 2020-12-26 ENCOUNTER — Other Ambulatory Visit: Payer: Self-pay

## 2020-12-29 MED FILL — Metformin HCl Tab 1000 MG: ORAL | 30 days supply | Qty: 60 | Fill #6 | Status: AC

## 2020-12-29 MED FILL — Tamsulosin HCl Cap 0.4 MG: ORAL | 30 days supply | Qty: 30 | Fill #6 | Status: AC

## 2020-12-31 ENCOUNTER — Other Ambulatory Visit: Payer: Self-pay

## 2021-01-06 MED FILL — Insulin Detemir Inj 100 Unit/ML: SUBCUTANEOUS | 85 days supply | Qty: 60 | Fill #3 | Status: AC

## 2021-01-07 ENCOUNTER — Other Ambulatory Visit: Payer: Self-pay

## 2021-01-14 ENCOUNTER — Other Ambulatory Visit: Payer: Self-pay

## 2021-01-14 NOTE — Progress Notes (Signed)
S:      Patient arrives in good spirits.  Presents for diabetes evaluation, education, and management. Patient was referred and last seen by Primary Care Provider on 11/27/2020. Patient was last seen by CPP on 12/25/2020. At that time, the patient reported occasional hypoglycemic symptoms, so glipizide dose was decreased. Today, patient reports no hypoglycemia and blood glucose is substantially increased.  Patient reports Diabetes was diagnosed in 2004 or 2005.   Family/Social History:  -Never smoker  Insurance coverage/medication affordability: Self pay  Medication adherence reported.   Current diabetes medications include:  -Glipizide 10 mg in the morning and 5 mg in the evening -Trulicity 3 mg weekly -Levemir 35 units BID -Metformin 1000 mg BID -Farxiga 10 mg daily  Current hypertension medications include:  -Amlodipine 5 mg daily  Current hyperlipidemia medications include:  -Pravastatin 10 mg daily -Ezetimibe 10 mg QD -Lovaza 1 capsule BID  Patient denies hypoglycemic events.  Patient reported dietary habits: Eats 3 meals/day. Patient reports recently poor diet with frequent snacking. Breakfast:sausage biscuit Lunch:Chili beans Dinner:Grilled chicken, tomatoes, collard greens, sometimes potatoes Drinks:drinks water, diet drinks, rarely drinks regular sodas  Patient-reported exercise habits: none, but reports physically demanding work   Patient reports nocturia (nighttime urination).  Patient denies neuropathy (nerve pain). Patient denies visual changes. Patient reports self foot exams.  Patient reports polydipsia    O:   Lab Results  Component Value Date   HGBA1C 8.1 (A) 11/27/2020   There were no vitals filed for this visit.  Lipid Panel     Component Value Date/Time   CHOL 208 (H) 08/17/2020 0937   TRIG 174 (H) 08/17/2020 0937   HDL 47 08/17/2020 0937   CHOLHDL 4.4 08/17/2020 0937   CHOLHDL 3.5 06/07/2015 1005   VLDL 33 (H) 06/07/2015 1005    LDLCALC 130 (H) 08/17/2020 0937    Home fasting blood sugars: 110, 178, 139, 140, 176, 173, 132  2 hour post-meal/random blood sugars: 275, 412, 253, 276, 280, 205.  Large fluctuations in blood glucose believed by the patient to be influenced by diet.  Clinical Atherosclerotic Cardiovascular Disease (ASCVD): No  The 10-year ASCVD risk score (Arnett DK, et al., 2019) is: 16.9%   Values used to calculate the score:     Age: 60 years     Sex: Male     Is Non-Hispanic African American: No     Diabetic: Yes     Tobacco smoker: No     Systolic Blood Pressure: 127 mmHg     Is BP treated: Yes     HDL Cholesterol: 47 mg/dL     Total Cholesterol: 208 mg/dL   A/P: Diabetes longstanding, currently not controlled. Patient is able to verbalize appropriate hypoglycemia management plan. Medication adherence appears adequate. Patient does not know medications by memory, but follows instructions on a medication list daily. Control is suboptimal due to diet. -Continue Glipizide 10 mg in the morning and 5 mg in the evening -Increase Trulicity to 4.5 mg weekly to improve diet, encourage weight loss, and prevent hypoglycemia. -Continue Levemir 35 units BID. May need increase next visit if BG does not decrease -Continue Metformin 1000 mg BID -Continue Farxiga 10 mg daily -Extensively discussed pathophysiology of diabetes, recommended lifestyle interventions, dietary effects on blood sugar control -Counseled on s/sx of and management of hypoglycemia -Refill for test strips sent in -Next A1C anticipated 02/26/2021.   ASCVD risk - primary prevention in patient with diabetes. Last LDL is not controlled. ASCVD risk score is  not >20%  - moderate intensity statin indicated. Aspirin is not indicated. 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 -Change pravastatin to rosuvastatin 5 mg daily. Can reduce to three times weekly if myopathy persists  Written patient instructions provided.  Total time in face to face counseling 35  minutes.  Follow up with CPP in 1 month. Follow up PCP 04/02/2021 in clinic.  Thank you for allowing pharmacy to participate in this patient's care.  Enos Fling, PharmD PGY1 Pharmacy Resident 01/15/2021 11:18 AM

## 2021-01-15 ENCOUNTER — Other Ambulatory Visit: Payer: Self-pay

## 2021-01-15 ENCOUNTER — Ambulatory Visit: Payer: Self-pay | Attending: Family Medicine | Admitting: Pharmacist

## 2021-01-15 DIAGNOSIS — Z794 Long term (current) use of insulin: Secondary | ICD-10-CM

## 2021-01-15 DIAGNOSIS — E7800 Pure hypercholesterolemia, unspecified: Secondary | ICD-10-CM

## 2021-01-15 DIAGNOSIS — G720 Drug-induced myopathy: Secondary | ICD-10-CM

## 2021-01-15 DIAGNOSIS — E1142 Type 2 diabetes mellitus with diabetic polyneuropathy: Secondary | ICD-10-CM

## 2021-01-15 DIAGNOSIS — T466X5A Adverse effect of antihyperlipidemic and antiarteriosclerotic drugs, initial encounter: Secondary | ICD-10-CM

## 2021-01-15 MED ORDER — TRULICITY 4.5 MG/0.5ML ~~LOC~~ SOAJ
4.5000 mg | SUBCUTANEOUS | 2 refills | Status: DC
  Filled 2021-01-15: qty 2, 28d supply, fill #0

## 2021-01-15 MED ORDER — ROSUVASTATIN CALCIUM 5 MG PO TABS
5.0000 mg | ORAL_TABLET | Freq: Every day | ORAL | 2 refills | Status: DC
  Filled 2021-01-15: qty 30, 30d supply, fill #0
  Filled 2021-02-09: qty 30, 30d supply, fill #1

## 2021-01-19 ENCOUNTER — Other Ambulatory Visit: Payer: Self-pay | Admitting: Family Medicine

## 2021-01-19 DIAGNOSIS — E1142 Type 2 diabetes mellitus with diabetic polyneuropathy: Secondary | ICD-10-CM

## 2021-01-20 MED ORDER — GABAPENTIN 300 MG PO CAPS
ORAL_CAPSULE | Freq: Three times a day (TID) | ORAL | 6 refills | Status: DC
  Filled 2021-01-20: qty 90, 30d supply, fill #0
  Filled 2021-02-14: qty 90, 30d supply, fill #1
  Filled 2021-03-21: qty 90, 30d supply, fill #2
  Filled 2021-03-22: qty 90, 30d supply, fill #0
  Filled 2021-05-08: qty 90, 30d supply, fill #1
  Filled 2021-06-20: qty 90, 30d supply, fill #2
  Filled 2021-07-25: qty 90, 30d supply, fill #3
  Filled 2021-08-29: qty 90, 30d supply, fill #4

## 2021-01-20 NOTE — Telephone Encounter (Signed)
Requested Prescriptions  Pending Prescriptions Disp Refills  . gabapentin (NEURONTIN) 300 MG capsule 90 capsule 6    Sig: TAKE 1 CAPSULE (300 MG TOTAL) BY MOUTH 3 (THREE) TIMES DAILY.     Neurology: Anticonvulsants - gabapentin Passed - 01/19/2021  7:16 PM      Passed - Valid encounter within last 12 months    Recent Outpatient Visits          5 days ago Type 2 diabetes mellitus with diabetic polyneuropathy, with long-term current use of insulin [facility])   [place] [facility] [place], Jeannett Senior L, RPH-CPP   3 weeks ago Type 2 diabetes mellitus with diabetic polyneuropathy, with long-term current use of insulin [facility])   [facility] [facility] [place], Jeannett Senior L, RPH-CPP   1 month ago Type 2 diabetes mellitus with diabetic polyneuropathy, with long-term current use of insulin (HCC)   [place] [facility] [place], [place], MD   5 months ago Type 2 diabetes mellitus with diabetic polyneuropathy, with long-term current use of insulin (HCC)   [place] [facility] [place], [place], MD   8 months ago Type 2 diabetes mellitus with diabetic polyneuropathy, with long-term current use of insulin [facility])   [place] [facility] Hoy Register, MD      Future Appointments            In 3 weeks Lois Huxley, Cornelius Moras, RPH-CPP [place] [facility]   In 2 months Hoy Register, MD [facility]

## 2021-01-21 ENCOUNTER — Other Ambulatory Visit: Payer: Self-pay

## 2021-01-26 ENCOUNTER — Other Ambulatory Visit: Payer: Self-pay | Admitting: Family Medicine

## 2021-01-26 DIAGNOSIS — R399 Unspecified symptoms and signs involving the genitourinary system: Secondary | ICD-10-CM

## 2021-01-27 MED ORDER — TAMSULOSIN HCL 0.4 MG PO CAPS
ORAL_CAPSULE | Freq: Every day | ORAL | 0 refills | Status: DC
  Filled 2021-01-27: qty 30, 30d supply, fill #0
  Filled 2021-02-21: qty 30, 30d supply, fill #1
  Filled 2021-03-28: qty 30, 30d supply, fill #2
  Filled 2021-03-28: qty 30, 30d supply, fill #0

## 2021-01-27 NOTE — Telephone Encounter (Signed)
/Requested medication (s) are due for refill today: yes / Requested medication (s) are on the active medication list: not active   Last refill: 11/27/20:  expired 01/23/21  Future visit scheduled: yes  Notes to clinic:  prescription ended   Requested Prescriptions  Pending Prescriptions Disp Refills   meloxicam (MOBIC) 15 MG tablet 30 tablet     Sig: Take 1 tablet (15 mg total) by mouth daily.     Analgesics:  COX2 Inhibitors Failed - 01/26/2021  6:41 PM      Failed - HGB in normal range and within 360 days    Hemoglobin  Date Value Ref Range Status  12/03/2015 12.6 (L) 13.2 - 17.1 g/dL Final          Passed - Cr in normal range and within 360 days    Creat  Date Value Ref Range Status  12/03/2015 0.85 0.70 - 1.33 mg/dL Final    Comment:      For patients > or = 58 years of age: The upper reference limit for Creatinine is approximately 13% higher for people identified as African-American.      Creatinine, Ser  Date Value Ref Range Status  08/17/2020 1.26 0.76 - 1.27 mg/dL Final   Creatinine, POC  Date Value Ref Range Status  08/14/2016 200 mg/dL Final          Passed - Patient is not pregnant      Passed - Valid encounter within last 12 months    Recent Outpatient Visits           1 week ago Type 2 diabetes mellitus with diabetic polyneuropathy, with long-term current use of insulin (HCC)   [place] [facility] [place], Jeannett Senior L, RPH-CPP   1 month ago Type 2 diabetes mellitus with diabetic polyneuropathy, with long-term current use of insulin (HCC)   [place] [facility] [place], Jeannett Senior L, RPH-CPP   2 months ago Type 2 diabetes mellitus with diabetic polyneuropathy, with long-term current use of insulin (HCC)   [place] [facility] [place], [place], MD   5 months ago Type 2 diabetes mellitus with diabetic polyneuropathy, with long-term current use of insulin (HCC)   [place] [facility] [place], [place], MD   8 months ago Type 2 diabetes mellitus with diabetic polyneuropathy, with long-term current use of insulin (HCC)   [place] [facility] Hoy Register, MD       Future Appointments             In 2 weeks Lois Huxley, Cornelius Moras, RPH-CPP [place] [facility]   In 2 months [place], [place], MD [facility]            Signed Prescriptions Disp Refills   tamsulosin (FLOMAX) 0.4 MG CAPS capsule 90 capsule 0    Sig: TAKE 1 CAPSULE (0.4 MG TOTAL) BY MOUTH DAILY.     Urology: Alpha-Adrenergic Blocker Passed - 01/26/2021  6:41 PM      Passed - Last BP in normal range    BP Readings from Last 1 Encounters:  11/27/20 127/70          Passed - Valid encounter within last 12 months    Recent Outpatient Visits           1 week ago Type 2 diabetes mellitus with diabetic polyneuropathy, with long-term current use of insulin (HCC)  [facility] Lois Huxley, Cornelius Moras, RPH-CPP   1 month ago Type 2 diabetes mellitus with diabetic polyneuropathy, with long-term current use of insulin [facility])   [place] [facility] [place], Jeannett Senior L, RPH-CPP   2 months ago Type 2 diabetes mellitus with diabetic polyneuropathy, with long-term current use of insulin (HCC)   [place] [facility] [place], Odette Horns, MD   5 months ago Type 2 diabetes mellitus with diabetic polyneuropathy, with long-term current use of insulin (HCC)   [place] [facility] [place], Odette Horns, MD   8 months ago Type 2 diabetes mellitus with diabetic polyneuropathy, with long-term current use of insulin [facility])   [place] [facility] Hoy Register, MD       Future Appointments             In 2 weeks Lois Huxley, Cornelius Moras, RPH-CPP [place] [facility]   In 2 months Hoy Register, MD [facility]

## 2021-01-27 NOTE — Telephone Encounter (Signed)
Requested Prescriptions  Pending Prescriptions Disp Refills  . tamsulosin (FLOMAX) 0.4 MG CAPS capsule 90 capsule 0    Sig: TAKE 1 CAPSULE (0.4 MG TOTAL) BY MOUTH DAILY.     Urology: Alpha-Adrenergic Blocker Passed - 01/26/2021  6:41 PM      Passed - Last BP in normal range    BP Readings from Last 1 Encounters:  11/27/20 127/70         Passed - Valid encounter within last 12 months    Recent Outpatient Visits          1 week ago Type 2 diabetes mellitus with diabetic polyneuropathy, with long-term current use of insulin [facility])   [place] [facility] And Wellness [place], Jeannett Senior L, RPH-CPP   1 month ago Type 2 diabetes mellitus with diabetic polyneuropathy, with long-term current use of insulin (HCC)   [place] [facility] [place], Jeannett Senior L, RPH-CPP   2 months ago Type 2 diabetes mellitus with diabetic polyneuropathy, with long-term current use of insulin (HCC)   [place] [facility] [place], [place], MD   5 months ago Type 2 diabetes mellitus with diabetic polyneuropathy, with long-term current use of insulin (HCC)   [place] [facility] [place], [place], MD   8 months ago Type 2 diabetes mellitus with diabetic polyneuropathy, with long-term current use of insulin [facility])   [place] [facility] Hoy Register, MD      Future Appointments            In 2 weeks Lois Huxley, Cornelius Moras, RPH-CPP [place] [facility]   In 2 months Hoy Register, MD [facility] And Wellness           . meloxicam (MOBIC) 15 MG tablet 30 tablet     Sig: Take 1 tablet (15 mg total) by mouth daily.     Analgesics:  COX2 Inhibitors Failed - 01/26/2021  6:41 PM      Failed - HGB in normal range and within 360 days    Hemoglobin  Date Value Ref Range Status  12/03/2015 12.6 (L) 13.2 - 17.1 g/dL Final         Passed - Cr in normal range and within 360 days     Creat  Date Value Ref Range Status  12/03/2015 0.85 0.70 - 1.33 mg/dL Final    Comment:      For patients > or = 58 years of age: The upper reference limit for Creatinine is approximately 13% higher for people identified as African-American.      Creatinine, Ser  Date Value Ref Range Status  08/17/2020 1.26 0.76 - 1.27 mg/dL Final   Creatinine, POC  Date Value Ref Range Status  08/14/2016 200 mg/dL Final         Passed - Patient is not pregnant      Passed - Valid encounter within last 12 months    Recent Outpatient Visits          1 week ago Type 2 diabetes mellitus with diabetic polyneuropathy, with long-term current use of insulin [facility])   [place] [facility] Wellness [place], Jeannett Senior L, RPH-CPP   1 month ago Type 2 diabetes mellitus with diabetic polyneuropathy, with long-term current use of insulin [facility])    [place] [facility] Wellness [place], Jeannett Senior L, RPH-CPP   2 months ago Type 2 diabetes mellitus with diabetic polyneuropathy, with  long-term current use of insulin (HCC)   [place] [facility] [place], [place], MD   5 months ago Type 2 diabetes mellitus with diabetic polyneuropathy, with long-term current use of insulin (HCC)   [place] [facility] [place], [place], MD   8 months ago Type 2 diabetes mellitus with diabetic polyneuropathy, with long-term current use of insulin [facility])   [place] [facility] Hoy Register, MD      Future Appointments            In 2 weeks Lois Huxley, Cornelius Moras, RPH-CPP [place] [facility]   In 2 months Hoy Register, MD [facility]ness

## 2021-01-28 ENCOUNTER — Other Ambulatory Visit: Payer: Self-pay

## 2021-01-30 MED ORDER — MELOXICAM 15 MG PO TABS
15.0000 mg | ORAL_TABLET | Freq: Every day | ORAL | 3 refills | Status: DC
  Filled 2021-01-30: qty 30, 30d supply, fill #0
  Filled 2021-02-21: qty 30, 30d supply, fill #1
  Filled 2021-03-21: qty 30, 30d supply, fill #2
  Filled 2021-03-22: qty 30, 30d supply, fill #0
  Filled 2021-04-20: qty 30, 30d supply, fill #1

## 2021-01-31 ENCOUNTER — Other Ambulatory Visit: Payer: Self-pay

## 2021-02-01 ENCOUNTER — Other Ambulatory Visit: Payer: Self-pay

## 2021-02-01 ENCOUNTER — Other Ambulatory Visit: Payer: Self-pay | Admitting: Family Medicine

## 2021-02-01 DIAGNOSIS — E1142 Type 2 diabetes mellitus with diabetic polyneuropathy: Secondary | ICD-10-CM

## 2021-02-02 MED ORDER — METFORMIN HCL 1000 MG PO TABS
ORAL_TABLET | Freq: Two times a day (BID) | ORAL | 1 refills | Status: DC
  Filled 2021-02-02: qty 60, 30d supply, fill #0
  Filled 2021-03-02: qty 60, 30d supply, fill #1

## 2021-02-02 NOTE — Telephone Encounter (Signed)
Requested Prescriptions  Pending Prescriptions Disp Refills  . metFORMIN (GLUCOPHAGE) 1000 MG tablet 60 tablet 1    Sig: TAKE 1 TABLET (1,000 MG TOTAL) BY MOUTH 2 (TWO) TIMES DAILY WITH A MEAL.     Endocrinology:  Diabetes - Biguanides Failed - 02/01/2021  5:55 PM      Failed - HBA1C is between 0 and 7.9 and within 180 days    HbA1c, POC (controlled diabetic range)  Date Value Ref Range Status  11/27/2020 8.1 (A) 0.0 - 7.0 % Final         Passed - Cr in normal range and within 360 days    Creat  Date Value Ref Range Status  12/03/2015 0.85 0.70 - 1.33 mg/dL Final    Comment:      For patients > or = 58 years of age: The upper reference limit for Creatinine is approximately 13% higher for people identified as African-American.      Creatinine, Ser  Date Value Ref Range Status  08/17/2020 1.26 0.76 - 1.27 mg/dL Final   Creatinine, POC  Date Value Ref Range Status  08/14/2016 200 mg/dL Final         Passed - eGFR in normal range and within 360 days    GFR, Est African American  Date Value Ref Range Status  12/03/2015 >89 >=60 mL/min Final   GFR calc Af Amer  Date Value Ref Range Status  02/14/2020 94 >59 mL/min/1.73 Final    Comment:    **In accordance with recommendations from the NKF-ASN Task force,**   [facility] is in the process of updating its eGFR calculation to the   2021 CKD-EPI creatinine equation that estimates kidney function   without a race variable.    GFR, Est Non African American  Date Value Ref Range Status  12/03/2015 >89 >=60 mL/min Final   GFR calc non Af Amer  Date Value Ref Range Status  02/14/2020 81 >59 mL/min/1.73 Final   eGFR  Date Value Ref Range Status  08/17/2020 66 >59 mL/min/1.73 Final         Passed - Valid encounter within last 6 months    Recent Outpatient Visits          2 weeks ago Type 2 diabetes mellitus with diabetic polyneuropathy, with long-term current use of insulin ([place])   [place], Annie Main L, RPH-CPP   1 month ago Type 2 diabetes mellitus with diabetic polyneuropathy, with long-term current use of insulin ([place])   [place], Annie Main L, RPH-CPP   2 months ago Type 2 diabetes mellitus with diabetic polyneuropathy, with long-term current use of insulin ([place])   [place], [place], MD   5 months ago Type 2 diabetes mellitus with diabetic polyneuropathy, with long-term current use of insulin ([place])   [place], [place], MD   8 months ago Type 2 diabetes mellitus with diabetic polyneuropathy, with long-term current use of insulin [facility])   [place], [name], MD      Future Appointments            In 1 week Daisy Blossom, Jarome [place], [place]   In 1 month Charlott Rakes, MD [place]

## 2021-02-04 ENCOUNTER — Other Ambulatory Visit: Payer: Self-pay

## 2021-02-06 ENCOUNTER — Other Ambulatory Visit: Payer: Self-pay

## 2021-02-11 ENCOUNTER — Other Ambulatory Visit: Payer: Self-pay

## 2021-02-12 ENCOUNTER — Other Ambulatory Visit: Payer: Self-pay

## 2021-02-12 ENCOUNTER — Ambulatory Visit: Payer: Self-pay | Attending: Family Medicine | Admitting: Pharmacist

## 2021-02-12 DIAGNOSIS — E1142 Type 2 diabetes mellitus with diabetic polyneuropathy: Secondary | ICD-10-CM

## 2021-02-12 DIAGNOSIS — Z794 Long term (current) use of insulin: Secondary | ICD-10-CM

## 2021-02-12 MED ORDER — TRULICITY 1.5 MG/0.5ML ~~LOC~~ SOAJ
1.5000 mg | SUBCUTANEOUS | 2 refills | Status: DC
  Filled 2021-02-12: qty 2, 28d supply, fill #0

## 2021-02-12 MED ORDER — INSULIN DETEMIR 100 UNIT/ML ~~LOC~~ SOLN
40.0000 [IU] | Freq: Two times a day (BID) | SUBCUTANEOUS | 2 refills | Status: DC
  Filled 2021-02-12: qty 30, 38d supply, fill #0
  Filled 2021-03-28: qty 20, 25d supply, fill #0
  Filled 2021-03-28: qty 30, 38d supply, fill #0
  Filled 2021-04-28: qty 20, 25d supply, fill #1
  Filled 2021-05-26: qty 50, 63d supply, fill #2

## 2021-02-12 MED ORDER — DAPAGLIFLOZIN PROPANEDIOL 10 MG PO TABS
ORAL_TABLET | Freq: Every day | ORAL | 6 refills | Status: DC
  Filled 2021-02-12 (×2): qty 30, 30d supply, fill #0

## 2021-02-12 NOTE — Progress Notes (Signed)
S:      Patient arrives in good spirits.  Presents for diabetes evaluation, education, and management. Patient was referred and last seen by Primary Care Provider on 11/27/2020. Patient was last seen by CPP on 01/15/2021. We changed pravastatin to rosuvastatin. We also increased his Trulicity to 4.5 mg weekly.   Patient reports Diabetes was diagnosed in 2004 or 2005. Unfortunately, the 3 and 4.5 mg strengths of Trulicity have been on backorder. The patient never picked up the 4.5 mg dose but has been taking the supply of 3 mg pens he had. His last injection of 3mg  was Sunday (02/10/2021).  Family/Social History:  -Never smoker  Insurance coverage/medication affordability: Self pay  Medication adherence reported.   Current diabetes medications include:  -Glipizide 10 mg in the morning and 5 mg in the evening -Trulicity 3 mg weekly -Levemir 35 units BID -Metformin 1000 mg BID -Farxiga 10 mg daily  Current hypertension medications include:  -Amlodipine 5 mg daily  Current hyperlipidemia medications include:  -Rosuvastatin 5 mg daily -Ezetimibe 10 mg QD -Lovaza 1 capsule BID  Patient denies hypoglycemic events.  Patient reported dietary habits: Eats 3 meals/day. Patient reports recently poor diet with frequent snacking. Breakfast:sausage biscuit Lunch:Chili beans Dinner:Grilled chicken, tomatoes, collard greens, sometimes potatoes Drinks:drinks water, diet drinks, rarely drinks regular sodas  Patient-reported exercise habits: none, but reports physically demanding work   Patient reports nocturia (nighttime urination).  Patient denies neuropathy (nerve pain). Patient denies visual changes. Patient reports self foot exams.  Patient reports polydipsia    O:   Lab Results  Component Value Date   HGBA1C 8.1 (A) 11/27/2020   There were no vitals filed for this visit.  Lipid Panel     Component Value Date/Time   CHOL 208 (H) 08/17/2020 0937   TRIG 174 (H) 08/17/2020  0937   HDL 47 08/17/2020 0937   CHOLHDL 4.4 08/17/2020 0937   CHOLHDL 3.5 06/07/2015 1005   VLDL 33 (H) 06/07/2015 1005   LDLCALC 130 (H) 08/17/2020 0937    Home fasting blood sugars: 86 - 167. 1 outlier of 202. 2 hour post-meal/random blood sugars: 180 - 369. 1 outlier of 401   Large fluctuations in blood glucose believed by the patient to be influenced by diet.  Clinical Atherosclerotic Cardiovascular Disease (ASCVD): No  The 10-year ASCVD risk score (Arnett DK, et al., 2019) is: 16.9%   Values used to calculate the score:     Age: 60 years     Sex: Male     Is Non-Hispanic African American: No     Diabetic: Yes     Tobacco smoker: No     Systolic Blood Pressure: 127 mmHg     Is BP treated: Yes     HDL Cholesterol: 47 mg/dL     Total Cholesterol: 208 mg/dL   A/P: Diabetes longstanding, currently not controlled. Patient is able to verbalize appropriate hypoglycemia management plan. Medication adherence appears adequate. Patient does not know medications by memory, but follows instructions on a medication list daily. Control is suboptimal due to diet. -Continue Glipizide 10 mg in the morning and 5 mg in the evening -Decrease Trulicity to 1.5 mg weekly as the 3 and 4.5 mg doses are on backorder -Increase Levemir to 40 units BID. -Continue Metformin 1000 mg BID -Continue Farxiga 10 mg daily -Extensively discussed pathophysiology of diabetes, recommended lifestyle interventions, dietary effects on blood sugar control -Counseled on s/sx of and management of hypoglycemia -Refill for test strips sent in -  Next A1C anticipated 02/26/2021.   ASCVD risk - primary prevention in patient with diabetes. Last LDL is not controlled. ASCVD risk score is not >20%  - moderate intensity statin indicated. Aspirin is not indicated. 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 - He is tolerating the rosuvastatin well.  - Continue rosuvastatin 5 mg daily.  Written patient instructions provided.  Total time in face  to face counseling 35 minutes.  Follow up with  PCP 04/02/2021 in clinic.  Thank you for allowing pharmacy to participate in this patient's care.  Butch Penny, PharmD, [place], CPP Clinical Pharmacist [facility] [phone]

## 2021-02-12 NOTE — Patient Instructions (Signed)
Thank you for coming to see me today. Please do the following:  Start taking 40 units of Levemir twice a day.  Continue taking metformin twice a day.  Start taking Marcelline Deist once a day in the morning.  Continue taking Trulicity once a week.  Continue taking 1 tablet of glipizide in the morning and 0.5 tablets in the evening.  Continue checking blood sugars at home. Continue making the lifestyle changes we've discussed together during our visit. Diet and exercise play a significant role in improving your blood sugars.  Follow-up with Dr. Alvis Lemmings in January.

## 2021-02-15 ENCOUNTER — Other Ambulatory Visit: Payer: Self-pay

## 2021-02-22 ENCOUNTER — Telehealth: Payer: Self-pay | Admitting: Pharmacist

## 2021-02-22 ENCOUNTER — Other Ambulatory Visit: Payer: Self-pay

## 2021-02-22 MED ORDER — TRULICITY 4.5 MG/0.5ML ~~LOC~~ SOAJ
4.5000 mg | SUBCUTANEOUS | 3 refills | Status: DC
  Filled 2021-02-22: qty 2, 28d supply, fill #0

## 2021-02-22 NOTE — Telephone Encounter (Signed)
Received notice that patient was approved for 4.5 mg Trulicity. I saw him earlier this month and decreased Trulicity to 1.5 mg qwk and increased Levemir to 40 units BID d/t supply issues with the 4.5 mg dose.   His 4.5 mg pens are now available and being shipped to him. I placed a call to the patient. His CBGs have been in the 120-140 range fasting but still elevated in the evening in the 200-300s. I instructed him to start the 4.5 mg Trulicity dose once he receives his shipment. I told him he can continue the 40u BID Levemir dose for now. Instructed him to reduce dose back to 35u BID if his CBGs drop with the increased dose of Trulicity.

## 2021-02-22 NOTE — Telephone Encounter (Signed)
Noted  

## 2021-02-26 ENCOUNTER — Other Ambulatory Visit: Payer: Self-pay

## 2021-03-04 ENCOUNTER — Other Ambulatory Visit: Payer: Self-pay

## 2021-03-11 ENCOUNTER — Other Ambulatory Visit: Payer: Self-pay

## 2021-03-15 ENCOUNTER — Other Ambulatory Visit: Payer: Self-pay

## 2021-03-22 ENCOUNTER — Other Ambulatory Visit: Payer: Self-pay

## 2021-03-26 ENCOUNTER — Other Ambulatory Visit: Payer: Self-pay

## 2021-03-28 ENCOUNTER — Other Ambulatory Visit: Payer: Self-pay | Admitting: Family Medicine

## 2021-03-28 DIAGNOSIS — E1142 Type 2 diabetes mellitus with diabetic polyneuropathy: Secondary | ICD-10-CM

## 2021-03-28 MED ORDER — GLIPIZIDE 10 MG PO TABS
ORAL_TABLET | ORAL | 2 refills | Status: DC
  Filled 2021-03-28: qty 45, fill #0
  Filled 2021-03-29: qty 45, 30d supply, fill #0
  Filled 2021-05-02: qty 45, 30d supply, fill #1
  Filled 2021-05-30: qty 45, 30d supply, fill #2

## 2021-03-28 NOTE — Telephone Encounter (Signed)
Requested Prescriptions  Pending Prescriptions Disp Refills   glipiZIDE (GLUCOTROL) 10 MG tablet 45 tablet 2    Sig: TAKE 1 TABLET (10 MG TOTAL) BY MOUTH in the morning and 0.5 tablet (5mg  total) by mouth in the evening.     Endocrinology:  Diabetes - Sulfonylureas Failed - 03/28/2021  4:44 PM      Failed - HBA1C is between 0 and 7.9 and within 180 days    HbA1c, POC (controlled diabetic range)  Date Value Ref Range Status  11/27/2020 8.1 (A) 0.0 - 7.0 % Final         Passed - Valid encounter within last 6 months    Recent Outpatient Visits          1 month ago Type 2 diabetes mellitus with diabetic polyneuropathy, with long-term current use of insulin [facility])   [place] [facility] [place], KOOMBERKINE L, RPH-CPP   2 months ago Type 2 diabetes mellitus with diabetic polyneuropathy, with long-term current use of insulin [facility])   [place] [facility] Wellness [place], KOOMBERKINE L, RPH-CPP   3 months ago Type 2 diabetes mellitus with diabetic polyneuropathy, with long-term current use of insulin [facility])   [place] [facility] And Wellness [place], KOOMBERKINE L, RPH-CPP   4 months ago Type 2 diabetes mellitus with diabetic polyneuropathy, with long-term current use of insulin (HCC)   [place] [facility] [place], [place], MD   7 months ago Type 2 diabetes mellitus with diabetic polyneuropathy, with long-term current use of insulin (HCC)   [place] [facility] [place], MD      Future Appointments            In 5 days Hoy Register, MD [facility]ness

## 2021-03-29 ENCOUNTER — Other Ambulatory Visit: Payer: Self-pay

## 2021-04-01 ENCOUNTER — Other Ambulatory Visit: Payer: Self-pay

## 2021-04-02 ENCOUNTER — Other Ambulatory Visit: Payer: Self-pay

## 2021-04-02 ENCOUNTER — Encounter: Payer: Self-pay | Admitting: Family Medicine

## 2021-04-02 ENCOUNTER — Ambulatory Visit: Payer: Self-pay | Attending: Family Medicine | Admitting: Family Medicine

## 2021-04-02 VITALS — BP 115/70 | HR 77 | Ht 76.0 in | Wt 351.0 lb

## 2021-04-02 DIAGNOSIS — I152 Hypertension secondary to endocrine disorders: Secondary | ICD-10-CM

## 2021-04-02 DIAGNOSIS — E1159 Type 2 diabetes mellitus with other circulatory complications: Secondary | ICD-10-CM

## 2021-04-02 DIAGNOSIS — E1169 Type 2 diabetes mellitus with other specified complication: Secondary | ICD-10-CM

## 2021-04-02 DIAGNOSIS — R351 Nocturia: Secondary | ICD-10-CM

## 2021-04-02 DIAGNOSIS — Z794 Long term (current) use of insulin: Secondary | ICD-10-CM

## 2021-04-02 DIAGNOSIS — T466X5A Adverse effect of antihyperlipidemic and antiarteriosclerotic drugs, initial encounter: Secondary | ICD-10-CM

## 2021-04-02 DIAGNOSIS — E785 Hyperlipidemia, unspecified: Secondary | ICD-10-CM

## 2021-04-02 DIAGNOSIS — G720 Drug-induced myopathy: Secondary | ICD-10-CM

## 2021-04-02 DIAGNOSIS — N401 Enlarged prostate with lower urinary tract symptoms: Secondary | ICD-10-CM

## 2021-04-02 DIAGNOSIS — E1142 Type 2 diabetes mellitus with diabetic polyneuropathy: Secondary | ICD-10-CM

## 2021-04-02 LAB — GLUCOSE, POCT (MANUAL RESULT ENTRY): POC Glucose: 143 mg/dl — AB (ref 70–99)

## 2021-04-02 LAB — POCT GLYCOSYLATED HEMOGLOBIN (HGB A1C): HbA1c, POC (controlled diabetic range): 8.4 % — AB (ref 0.0–7.0)

## 2021-04-02 MED ORDER — FINASTERIDE 5 MG PO TABS
5.0000 mg | ORAL_TABLET | Freq: Every day | ORAL | 1 refills | Status: DC
  Filled 2021-04-02: qty 30, 30d supply, fill #0
  Filled 2021-05-08: qty 30, 30d supply, fill #1
  Filled 2021-06-05: qty 30, 30d supply, fill #2
  Filled 2021-07-11: qty 30, 30d supply, fill #3
  Filled 2021-08-09: qty 30, 30d supply, fill #4
  Filled 2021-09-08: qty 30, 30d supply, fill #5

## 2021-04-02 MED ORDER — AMLODIPINE BESYLATE 5 MG PO TABS
ORAL_TABLET | Freq: Every day | ORAL | 1 refills | Status: DC
  Filled 2021-04-02: qty 30, 30d supply, fill #0
  Filled 2021-05-16: qty 30, 30d supply, fill #1
  Filled 2021-06-13: qty 30, 30d supply, fill #2
  Filled 2021-07-11: qty 90, 90d supply, fill #3

## 2021-04-02 MED ORDER — EZETIMIBE 10 MG PO TABS
ORAL_TABLET | Freq: Every day | ORAL | 1 refills | Status: DC
  Filled 2021-04-02: qty 90, fill #0
  Filled 2021-05-02: qty 30, 30d supply, fill #0
  Filled 2021-05-30: qty 30, 30d supply, fill #1
  Filled 2021-07-04: qty 90, 90d supply, fill #2

## 2021-04-02 MED ORDER — TAMSULOSIN HCL 0.4 MG PO CAPS
ORAL_CAPSULE | Freq: Every day | ORAL | 1 refills | Status: DC
  Filled 2021-04-02: qty 90, fill #0
  Filled 2021-05-02: qty 30, 30d supply, fill #0
  Filled 2021-05-08 – 2021-05-30 (×2): qty 30, 30d supply, fill #1
  Filled 2021-06-25: qty 30, 30d supply, fill #2
  Filled 2021-07-25: qty 30, 30d supply, fill #3
  Filled 2021-08-29: qty 30, 30d supply, fill #4
  Filled 2021-10-03: qty 30, 30d supply, fill #5

## 2021-04-02 MED ORDER — OMEGA-3-ACID ETHYL ESTERS 1 G PO CAPS
1.0000 | ORAL_CAPSULE | Freq: Two times a day (BID) | ORAL | 1 refills | Status: DC
  Filled 2021-04-02: qty 60, 30d supply, fill #0
  Filled 2021-05-02: qty 60, 30d supply, fill #1
  Filled 2021-06-05: qty 60, 30d supply, fill #2
  Filled 2021-07-04: qty 60, 30d supply, fill #3
  Filled 2021-08-08: qty 60, 30d supply, fill #4
  Filled 2021-09-05: qty 60, 30d supply, fill #5

## 2021-04-02 MED ORDER — METFORMIN HCL 1000 MG PO TABS
ORAL_TABLET | Freq: Two times a day (BID) | ORAL | 1 refills | Status: DC
  Filled 2021-04-02: qty 60, 30d supply, fill #0
  Filled 2021-05-02: qty 60, 30d supply, fill #1
  Filled 2021-05-30: qty 60, 30d supply, fill #2
  Filled 2021-07-04: qty 60, 30d supply, fill #3
  Filled 2021-08-08: qty 60, 30d supply, fill #4
  Filled 2021-09-05: qty 60, 30d supply, fill #5

## 2021-04-02 NOTE — Patient Instructions (Signed)
Myopathy Myopathy is a condition that causes muscles to be weak and not work well. There are many different kinds of myopathies. Myopathies may be passed from parent to child (inherited), or they may be caused by external factors (acquired). Inherited myopathies may cause symptoms at birth or early in life. Acquired myopathies may start suddenly at any age. There is no cure for most myopathies. What are the causes? Common causes of myopathy include: Endocrine disorders, such as thyroid disease. Metabolic disorders, which are usually inherited. Infection or inflammation of the muscles. This is often triggered by viruses or because the body's defense system (immune system) is attacking the muscles. Certain medicines, such as lipid-lowering medicines. In some cases, the cause is not known. What increases the risk? You are more likely to develop this condition if you: Have a family history of myopathy. Are male. What are the signs or symptoms? Symptoms of myopathy can range from mild to severe. Common symptoms of this condition include: Muscle weakness. Cramps. Stiffness. Spasms. These symptoms are usually felt close to the center of the body (proximal). Depending upon the type of myopathy, one muscle group may be more affected than others. In inherited myopathies, symptoms vary among family members. Other symptoms of myopathy include: Muscle pain or tenderness. Muscle weakness that gets progressively worse. Fatigue. Heart problems. Trouble breathing. Trouble swallowing. How is this diagnosed? This condition is diagnosed based on your medical history and tests, which may include: Blood tests. Removal of a small piece of muscle tissue to be tested (biopsy). Electromyogram (EMG). MRI. Electrocardiogram (ECG). Genetic testing. How is this treated? Treatment for this condition depends on the type of myopathy. Treatment may include: Over-the-counter medicines such as acetaminophen or  ibuprofen. Prescription medicines, such as disease-modifying antirheumatic drugs (DMARDs) or drugs that suppress the immune system. Physical therapy. A brace to help stabilize your muscles. Follow these instructions at home: If you have a brace: Wear the brace as told by your health care provider. Remove it only as told by your health care provider. Loosen the brace if any part of your body tingles, becomes numb, or turns cold and blue. Keep the brace clean. Check the skin around it every day. Tell your health care provider about any concerns. If the brace is not waterproof: Do not let it get wet. Cover it with a watertight covering when you take a bath or shower. Ask your health care provider when it is safe to drive if you are wearing a brace. General instructions Take over-the-counter and prescription medicines only as told by your health care provider. Maintain a healthy weight. Follow instructions from your health care provider about eating, drinking, and physical activity. If physical therapy is prescribed, do exercises as told by your health care provider or physical therapist. Keep all follow-up visits as told by your health care provider. This is important. Contact a health care provider if you have: Trouble managing your symptoms at home. A fever. Get help right away if you develop: Breathing problems. Chest pain. Summary Myopathy is a condition that causes muscles to be weak and not work well. There is no cure for most myopathies. This condition may be treated with medicines, physical therapy, and a brace to help stabilize your muscles. This information is not intended to replace advice given to you by your health care provider. Make sure you discuss any questions you have with your health care provider. Document Revised: 11/10/2017 Document Reviewed: 11/10/2017 Elsevier Patient Education  2022 [employer].

## 2021-04-02 NOTE — Progress Notes (Signed)
Having tightness in legs.

## 2021-04-02 NOTE — Progress Notes (Signed)
Subjective:  Patient ID: Dillon Mcgee, male    DOB: Jan 23, 1961  Age: 61 y.o. MRN: [id number]  CC: Diabetes   HPI Dillon Mcgee is a 61 y.o. year old male with a history of hypertension, type 2 diabetes mellitus (A1c 8.4), hyperlipidemia, statin intolerance, obesity here for follow-up visit.  Interval History: He continues to experience tightness in his legs which has been chronic and his statin had been changed to Crestor 33m after he could not tolerate Pravastatin 159mtwice a week. Cramps occur in thighs mainly when he is working and he describes his muscles as being tight as a drum. Some other days he can run around with a lot of energy and on other days he has no energy.  A1c is 8.4 up from 8.1. Fasting sugars have been 119 - 151 with an outlier of 197 and random has been taking it right after meals and getting 398, 460 right after meals which is elevated and expected to be so due to the fact that it is right after meals. Due to the back order of higher doses of Trulicity he had to be decreased to 1.27m58mut has now been able to obtain the 4.5. he has been administering 35 units of Levemir bid. Compliant with antihypertensive and BPH medications.  Past Medical History:  Diagnosis Date   Diabetes mellitus without complication ([place]  Hyperlipidemia    Hypertension     Past Surgical History:  Procedure Laterality Date   COLONOSCOPY     5 years ago     Family History  Problem Relation Age of Onset   Heart disease Maternal Aunt    Diabetes Maternal Uncle     Allergies  Allergen Reactions   Lisinopril Other (See Comments)    Hyperkalemia    Pioglitazone Swelling    Lower extremity swelling.    Outpatient Medications Prior to Visit  Medication Sig Dispense Refill   aspirin EC 81 MG tablet Take 1 tablet (81 mg total) daily by mouth. 90 tablet 3   Blood Glucose Monitoring Suppl (TRUE METRIX GO GLUCOSE METER) w/Device KIT 1 each by Does not apply route every 8  (eight) hours as needed. 1 kit 0   calcipotriene-betamethasone (TACLONEX) ointment Apply ointment to affected area(s) daily. 100 g 1   dapagliflozin propanediol (FARXIGA) 10 MG TABS tablet TAKE 1 TABLET (10 MG TOTAL) BY MOUTH DAILY BEFORE BREAKFAST. 30 tablet 6   Dulaglutide (TRULICITY) 4.5 MG/YB/6.3SLPN Inject 4.5 mg as directed once a week. 2 mL 3   ferrous sulfate 325 (65 FE) MG tablet Take 325 mg by mouth daily with breakfast.     gabapentin (NEURONTIN) 300 MG capsule TAKE 1 CAPSULE (300 MG TOTAL) BY MOUTH 3 (THREE) TIMES DAILY. 90 capsule 6   glipiZIDE (GLUCOTROL) 10 MG tablet TAKE 1 TABLET (10 MG TOTAL) BY MOUTH in the morning and 0.5 tablet (27mg21mtal) by mouth in the evening. 45 tablet 2   glucose blood test strip Use as instructed 3 times daily. 100 strip 12   insulin detemir (LEVEMIR) 100 UNIT/ML injection Inject 0.4 mLs (40 Units total) into the skin 2 (two) times daily. 30 mL 2   Insulin Syringe-Needle U-100 (TRUEPLUS INSULIN SYRINGE) 30G X 5/16" 0.5 ML MISC USE AS DIRECTED 4 TIMES DAILY 100 each 3   meloxicam (MOBIC) 15 MG tablet Take 1 tablet (15 mg total) by mouth daily. 30 tablet 3   rosuvastatin (CRESTOR) 5 MG tablet Take 1 tablet (5 mg  total) by mouth daily. 30 tablet 2   TRUEPLUS LANCETS 26G MISC 1 each by Does not apply route every 8 (eight) hours as needed. 100 each 12   TRUEPLUS PEN NEEDLES 32G X 4 MM MISC USE AS DIRECTED ONCE DAILY 100 each 6   amLODipine (NORVASC) 5 MG tablet TAKE 1 TABLET (5 MG TOTAL) BY MOUTH DAILY. 90 tablet 1   ezetimibe (ZETIA) 10 MG tablet TAKE 1 TABLET (10 MG TOTAL) BY MOUTH DAILY. 90 tablet 1   finasteride (PROSCAR) 5 MG tablet Take 1 tablet (5 mg total) by mouth daily. 30 tablet 3   metFORMIN (GLUCOPHAGE) 1000 MG tablet TAKE 1 TABLET (1,000 MG TOTAL) BY MOUTH 2 (TWO) TIMES DAILY WITH A MEAL. 60 tablet 1   omega-3 acid ethyl esters (LOVAZA) 1 g capsule TAKE 1 CAPSULE BY MOUTH 2 TIMES DAILY. 60 capsule 2   tamsulosin (FLOMAX) 0.4 MG CAPS capsule TAKE  1 CAPSULE (0.4 MG TOTAL) BY MOUTH DAILY. 90 capsule 0   No facility-administered medications prior to visit.     ROS Review of Systems  Constitutional:  Negative for activity change and appetite change.  HENT:  Negative for sinus pressure and sore throat.   Eyes:  Negative for visual disturbance.  Respiratory:  Negative for cough, chest tightness and shortness of breath.   Cardiovascular:  Negative for chest pain and leg swelling.  Gastrointestinal:  Negative for abdominal distention, abdominal pain, constipation and diarrhea.  Endocrine: Negative.   Genitourinary:  Negative for dysuria.  Musculoskeletal:        See HPI  Skin:  Negative for rash.  Allergic/Immunologic: Negative.   Neurological:  Negative for weakness, light-headedness and numbness.  Psychiatric/Behavioral:  Negative for dysphoric mood and suicidal ideas.    Objective:  BP 115/70    Pulse 77    Ht 6' 4" (1.93 m)    Wt (!) 351 lb (159.2 kg)    SpO2 98%    BMI 42.73 kg/m   BP/Weight 04/02/2021 11/27/2020 8/34/1960  Systolic BP 229 798 921  Diastolic BP 70 70 73  Wt. (Lbs) 351 354.8 369  BMI 42.73 43.19 46.12      Physical Exam Constitutional:      Appearance: He is well-developed.  Cardiovascular:     Rate and Rhythm: Normal rate.     Heart sounds: Normal heart sounds. No murmur heard. Pulmonary:     Effort: Pulmonary effort is normal.     Breath sounds: Normal breath sounds. No wheezing or rales.  Chest:     Chest wall: No tenderness.  Abdominal:     General: Bowel sounds are normal. There is no distension.     Palpations: Abdomen is soft. There is no mass.     Tenderness: There is no abdominal tenderness.  Musculoskeletal:        General: Normal range of motion.     Right lower leg: No edema.     Left lower leg: No edema.  Neurological:     Mental Status: He is alert and oriented to person, place, and time.  Psychiatric:        Mood and Affect: Mood normal.   Diabetic Foot Exam - Simple    Simple Foot Form Diabetic Foot exam was performed with the following findings: Yes 04/02/2021  9:09 AM  Visual Inspection No deformities, no ulcerations, no other skin breakdown bilaterally: Yes Sensation Testing Intact to touch and monofilament testing bilaterally: Yes Pulse Check Posterior Tibialis and Dorsalis pulse  intact bilaterally: Yes Comments     CMP Latest Ref Rng & Units 08/17/2020 02/14/2020 11/15/2019  Glucose 65 - 99 mg/dL 93 122(H) 174(H)  BUN 6 - 24 mg/dL _0 Creatinine 0.76 - 1.27 mg/dL 1.26 1.02 1.05  Sodium 134 - 144 mmol/L 145(H) 142 141  Potassium 3.5 - 5.2 mmol/L 5.0 5.1 5.5(H)  Chloride 96 - 106 mmol/L 106 102 102  CO2 20 - 29 mmol/L _1 Calcium 8.7 - 10.2 mg/dL 9.3 9.2 9.8  Total Protein 6.0 - 8.5 g/dL 6.9 - 7.1  Total Bilirubin 0.0 - 1.2 mg/dL 0.3 - 0.3  Alkaline Phos 44 - 121 IU/L 94 - 98  AST 0 - 40 IU/L 24 - 39  ALT 0 - 44 IU/L 16 - 28    Lipid Panel     Component Value Date/Time   CHOL 208 (H) 08/17/2020 0937   TRIG 174 (H) 08/17/2020 0937   HDL 47 08/17/2020 0937   CHOLHDL 4.4 08/17/2020 0937   CHOLHDL 3.5 06/07/2015 1005   VLDL 33 (H) 06/07/2015 1005   LDLCALC 130 (H) 08/17/2020 0937    CBC    Component Value Date/Time   WBC 7.4 12/03/2015 1704   RBC 4.17 (L) 12/03/2015 1704   HGB 12.6 (L) 12/03/2015 1704   HCT 38.9 12/03/2015 1704   PLT 288 12/03/2015 1704   MCV 93.3 12/03/2015 1704   MCH 30.2 12/03/2015 1704   MCHC 32.4 12/03/2015 1704   RDW 13.1 12/03/2015 1704   LYMPHSABS 2,294 12/03/2015 1704   MONOABS 518 12/03/2015 1704   EOSABS 148 12/03/2015 1704   BASOSABS 74 12/03/2015 1704    Lab Results  Component Value Date   HGBA1C 8.4 (A) 04/02/2021    Assessment & Plan:  1. Type 2 diabetes mellitus with diabetic polyneuropathy, with long-term current use of insulin (HCC) Uncontrolled with A1c of 8.4 Shortage of Trulicity including reduction of dose partly responsible He is back on the 4.5 mg of  Trulicity Advised to increase Levemir from 35 units twice daily which he has been administering to 37 units twice daily is fasting sugars are still not at goal Educated on proper way of checking random blood sugars at least 2 hours post meals Counseled on Diabetic diet, my plate method, 628 minutes of moderate intensity exercise/week Blood sugar logs with fasting goals of 80-120 mg/dl, random of less than 180 and in the event of sugars less than 60 mg/dl or greater than 400 mg/dl encouraged to notify the clinic. Advised on the need for annual eye exams, annual foot exams, Pneumonia vaccine. - POCT glucose (manual entry) - POCT glycosylated hemoglobin (Hb A1C) - metFORMIN (GLUCOPHAGE) 1000 MG tablet; TAKE 1 TABLET (1,000 MG TOTAL) BY MOUTH 2 (TWO) TIMES DAILY WITH A MEAL.  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 Hypertension associated with type 2 diabetes mellitus ([place]) Controlled .Counseled on blood pressure goal of less than 130/80, low-sodium, DASH diet, medication compliance, 150 minutes of moderate intensity exercise per week. Discussed medication compliance, adverse effects. - amLODipine (NORVASC) 5 MG tablet; TAKE 1 TABLET (5 MG TOTAL) BY MOUTH DAILY.  Dispense: 90 tablet; Refill: 1  4. Hyperlipidemia associated with type  2 diabetes mellitus ([place]) Unable to tolerate statin Continue Zetia and omega-3 Will refer to cardiology lipid clinic for initiation of PCSK9 inhibitor - Ambulatory referral to Cardiology - ezetimibe (ZETIA) 10 MG tablet; TAKE 1 TABLET (10 MG TOTAL) BY MOUTH DAILY.  Dispense: 90 tablet; Refill: 1 - omega-3 acid ethyl esters (LOVAZA) 1 g capsule; TAKE 1 CAPSULE BY  MOUTH 2 TIMES DAILY.  Dispense: 180 capsule; Refill: 1  5. Benign prostatic hyperplasia with nocturia Controlled - tamsulosin (FLOMAX) 0.4 MG CAPS capsule; TAKE 1 CAPSULE (0.4 MG TOTAL) BY MOUTH DAILY.  Dispense: 90 capsule; Refill: 1 - finasteride (PROSCAR) 5 MG tablet; Take 1 tablet (5 mg total) by mouth daily.  Dispense: 90 tablet; Refill: 1  Health Care Maintenance: Second dose of Shingrix today Meds ordered this encounter  Medications   amLODipine (NORVASC) 5 MG tablet    Sig: TAKE 1 TABLET (5 MG TOTAL) BY MOUTH DAILY.    Dispense:  90 tablet    Refill:  1   ezetimibe (ZETIA) 10 MG tablet    Sig: TAKE 1 TABLET (10 MG TOTAL) BY MOUTH DAILY.    Dispense:  90 tablet    Refill:  1   metFORMIN (GLUCOPHAGE) 1000 MG tablet    Sig: TAKE 1 TABLET (1,000 MG TOTAL) BY MOUTH 2 (TWO) TIMES DAILY WITH A MEAL.    Dispense:  180 tablet    Refill:  1   omega-3 acid ethyl esters (LOVAZA) 1 g capsule    Sig: TAKE 1 CAPSULE BY MOUTH 2 TIMES DAILY.    Dispense:  180 capsule    Refill:  1   tamsulosin (FLOMAX) 0.4 MG CAPS capsule    Sig: TAKE 1 CAPSULE (0.4 MG TOTAL) BY MOUTH DAILY.    Dispense:  90 capsule    Refill:  1   finasteride (PROSCAR) 5 MG tablet    Sig: Take 1 tablet (5 mg total) by mouth daily.    Dispense:  90 tablet    Refill:  1    Follow-up: Return in about 3 months (around 06/30/2021) for Chronic medical conditions.       Charlott Rakes, MD, FAAFP. [facility] [place], [place]   04/02/2021, 9:12 AM

## 2021-04-04 ENCOUNTER — Telehealth: Payer: Self-pay

## 2021-04-04 NOTE — Telephone Encounter (Signed)
-----   Message from Hoy Register, MD sent at 04/04/2021  2:50 PM EST ----- Please inform him that his labs are normal except for mildly increased levels of inflammation which can occur with joint pains.  His muscle enzymes are normal and he has no evidence of autoimmune condition which could be causing muscle pains.

## 2021-04-04 NOTE — Telephone Encounter (Signed)
Patient name and DOB has been verified Patient was informed of lab results. Patient had no questions.  

## 2021-04-08 LAB — CMP14+EGFR
ALT: 12 IU/L (ref 0–44)
AST: 15 IU/L (ref 0–40)
Albumin/Globulin Ratio: 1.5 (ref 1.2–2.2)
Albumin: 4.4 g/dL (ref 3.8–4.9)
Alkaline Phosphatase: 94 IU/L (ref 44–121)
BUN/Creatinine Ratio: 14 (ref 9–20)
BUN: 14 mg/dL (ref 6–24)
Bilirubin Total: 0.4 mg/dL (ref 0.0–1.2)
CO2: 26 mmol/L (ref 20–29)
Calcium: 10 mg/dL (ref 8.7–10.2)
Chloride: 105 mmol/L (ref 96–106)
Creatinine, Ser: 1.01 mg/dL (ref 0.76–1.27)
Globulin, Total: 2.9 g/dL (ref 1.5–4.5)
Glucose: 109 mg/dL — ABNORMAL HIGH (ref 70–99)
Potassium: 5 mmol/L (ref 3.5–5.2)
Sodium: 147 mmol/L — ABNORMAL HIGH (ref 134–144)
Total Protein: 7.3 g/dL (ref 6.0–8.5)
eGFR: 86 mL/min/{1.73_m2} (ref >59)

## 2021-04-08 LAB — ALDOLASE: Aldolase: 4.9 U/L (ref 3.3–10.3)

## 2021-04-08 LAB — C-REACTIVE PROTEIN: CRP: 18 mg/L — ABNORMAL HIGH (ref 0–10)

## 2021-04-08 LAB — ANA,IFA RA DIAG PNL W/RFLX TIT/PATN
ANA Titer 1: NEGATIVE
Cyclic Citrullin Peptide Ab: 3 units (ref 0–19)
Rheumatoid fact SerPl-aCnc: 10.2 IU/mL (ref <14.0)

## 2021-04-08 LAB — SEDIMENTATION RATE: Sed Rate: 16 mm/hr (ref 0–30)

## 2021-04-08 LAB — CREATININE KINASE MB: CK-MB Index: 1.9 ng/mL (ref 0.0–10.4)

## 2021-04-11 ENCOUNTER — Other Ambulatory Visit: Payer: Self-pay

## 2021-04-22 ENCOUNTER — Other Ambulatory Visit: Payer: Self-pay

## 2021-04-25 ENCOUNTER — Other Ambulatory Visit: Payer: Self-pay

## 2021-04-29 ENCOUNTER — Other Ambulatory Visit: Payer: Self-pay

## 2021-05-02 ENCOUNTER — Other Ambulatory Visit: Payer: Self-pay

## 2021-05-03 ENCOUNTER — Other Ambulatory Visit: Payer: Self-pay

## 2021-05-08 ENCOUNTER — Other Ambulatory Visit: Payer: Self-pay

## 2021-05-09 ENCOUNTER — Other Ambulatory Visit: Payer: Self-pay

## 2021-05-10 ENCOUNTER — Other Ambulatory Visit: Payer: Self-pay

## 2021-05-17 ENCOUNTER — Other Ambulatory Visit: Payer: Self-pay

## 2021-05-21 ENCOUNTER — Other Ambulatory Visit: Payer: Self-pay

## 2021-05-22 ENCOUNTER — Other Ambulatory Visit: Payer: Self-pay

## 2021-05-26 ENCOUNTER — Other Ambulatory Visit: Payer: Self-pay | Admitting: Family Medicine

## 2021-05-27 ENCOUNTER — Other Ambulatory Visit: Payer: Self-pay

## 2021-05-28 ENCOUNTER — Other Ambulatory Visit: Payer: Self-pay | Admitting: Family Medicine

## 2021-05-28 ENCOUNTER — Other Ambulatory Visit: Payer: Self-pay

## 2021-05-28 NOTE — Telephone Encounter (Signed)
Requested medication (s) are due for refill today:  ? ?Requested medication (s) are on the active medication list: No ? ?Last refill:  01/30/21 ? ?Future visit scheduled: Yes ? ?Notes to clinic:  Not on medication list. ? ? ? ?Requested Prescriptions  ?Pending Prescriptions Disp Refills  ? meloxicam (MOBIC) 15 MG tablet 30 tablet 3  ?  Sig: Take 1 tablet (15 mg total) by mouth daily.  ?  ? Analgesics:  COX2 Inhibitors Failed - 05/28/2021  1:44 PM  ?  ?  Failed - Manual Review: Labs are only required if the patient has taken medication for more than 8 weeks.  ?  ?  Failed - HGB in normal range and within 360 days  ?  Hemoglobin  ?Date Value Ref Range Status  ?12/03/2015 12.6 (L) 13.2 - 17.1 g/dL Final  ?  ?  ?  ?  Failed - HCT in normal range and within 360 days  ?  HCT  ?Date Value Ref Range Status  ?12/03/2015 38.9 38.5 - 50.0 % Final  ?  ?  ?  ?  Passed - Cr in normal range and within 360 days  ?  Creat  ?Date Value Ref Range Status  ?12/03/2015 0.85 0.70 - 1.33 mg/dL Final  ?  Comment:  ?    ?For patients > or = 59 years of age: The upper reference limit for ?Creatinine is approximately 13% higher for people identified as ?African-American. ?  ?  ? ?Creatinine, Ser  ?Date Value Ref Range Status  ?04/02/2021 1.01 0.76 - 1.27 mg/dL Final  ? ?Creatinine, POC  ?Date Value Ref Range Status  ?08/14/2016 200 mg/dL Final  ?  ?  ?  ?  Passed - AST in normal range and within 360 days  ?  AST  ?Date Value Ref Range Status  ?04/02/2021 15 0 - 40 IU/L Final  ?  ?  ?  ?  Passed - ALT in normal range and within 360 days  ?  ALT  ?Date Value Ref Range Status  ?04/02/2021 12 0 - 44 IU/L Final  ?  ?  ?  ?  Passed - eGFR is 30 or above and within 360 days  ?  GFR, Est African American  ?Date Value Ref Range Status  ?12/03/2015 >89 >=60 mL/min Final  ? ?GFR calc Af Amer  ?Date Value Ref Range Status  ?02/14/2020 94 >59 mL/min/1.73 Final  ?  Comment:  ?  **In accordance with recommendations from the NKF-ASN Task force,** ?  [facility] is  in the process of updating its eGFR calculation to the ?  2021 CKD-EPI creatinine equation that estimates kidney function ?  without a race variable. ?  ? ?GFR, Est Non African American  ?Date Value Ref Range Status  ?12/03/2015 >89 >=60 mL/min Final  ? ?GFR calc non Af Amer  ?Date Value Ref Range Status  ?02/14/2020 81 >59 mL/min/1.73 Final  ? ?eGFR  ?Date Value Ref Range Status  ?04/02/2021 86 >59 mL/min/1.73 Final  ?  ?  ?  ?  Passed - Patient is not pregnant  ?  ?  Passed - Valid encounter within last 12 months  ?  Recent Outpatient Visits   ? ?      ? 1 month ago Type 2 diabetes mellitus with diabetic polyneuropathy, with long-term current use of insulin ([place])  ? [place], Charlane Ferretti, MD  ? 3 months ago Type 2 diabetes mellitus  with diabetic polyneuropathy, with long-term current use of insulin ([place])  ? [place], RPH-CPP  ? 4 months ago Type 2 diabetes mellitus with diabetic polyneuropathy, with long-term current use of insulin ([place])  ? [place], RPH-CPP  ? 5 months ago Type 2 diabetes mellitus with diabetic polyneuropathy, with long-term current use of insulin ([place])  ? [place], RPH-CPP  ? 6 months ago Type 2 diabetes mellitus with diabetic polyneuropathy, with long-term current use of insulin ([place])  ? [place] Charlott Rakes, MD  ? ?  ?  ?Future Appointments   ? ?        ? In 1 month Charlott Rakes, MD [place]  ? ?  ? ?  ?  ?  ? ?

## 2021-05-29 ENCOUNTER — Other Ambulatory Visit: Payer: Self-pay

## 2021-05-29 MED ORDER — MELOXICAM 15 MG PO TABS
15.0000 mg | ORAL_TABLET | Freq: Every day | ORAL | 3 refills | Status: DC
  Filled 2021-05-29: qty 30, 30d supply, fill #0
  Filled 2021-06-25 – 2021-07-02 (×2): qty 30, 30d supply, fill #1
  Filled 2021-07-25: qty 30, 30d supply, fill #2
  Filled 2021-08-29: qty 30, 30d supply, fill #3

## 2021-05-31 ENCOUNTER — Other Ambulatory Visit: Payer: Self-pay

## 2021-06-06 ENCOUNTER — Other Ambulatory Visit: Payer: Self-pay

## 2021-06-07 ENCOUNTER — Other Ambulatory Visit: Payer: Self-pay

## 2021-06-14 ENCOUNTER — Other Ambulatory Visit: Payer: Self-pay

## 2021-06-20 ENCOUNTER — Other Ambulatory Visit: Payer: Self-pay

## 2021-06-21 ENCOUNTER — Other Ambulatory Visit: Payer: Self-pay

## 2021-06-26 ENCOUNTER — Other Ambulatory Visit: Payer: Self-pay

## 2021-06-28 ENCOUNTER — Other Ambulatory Visit: Payer: Self-pay

## 2021-07-02 ENCOUNTER — Other Ambulatory Visit: Payer: Self-pay

## 2021-07-03 ENCOUNTER — Other Ambulatory Visit: Payer: Self-pay

## 2021-07-04 ENCOUNTER — Other Ambulatory Visit: Payer: Self-pay | Admitting: Family Medicine

## 2021-07-04 DIAGNOSIS — E1142 Type 2 diabetes mellitus with diabetic polyneuropathy: Secondary | ICD-10-CM

## 2021-07-05 ENCOUNTER — Other Ambulatory Visit: Payer: Self-pay

## 2021-07-05 MED ORDER — GLIPIZIDE 10 MG PO TABS
ORAL_TABLET | ORAL | 2 refills | Status: DC
  Filled 2021-07-05: qty 45, 30d supply, fill #0
  Filled 2021-08-01: qty 45, 30d supply, fill #1
  Filled 2021-09-05: qty 45, 30d supply, fill #2

## 2021-07-05 NOTE — Telephone Encounter (Signed)
Requested Prescriptions  ?Pending Prescriptions Disp Refills  ?? glipiZIDE (GLUCOTROL) 10 MG tablet 45 tablet 2  ?  Sig: TAKE 1 TABLET (10 MG TOTAL) BY MOUTH in the morning and 0.5 tablet (5mg  total) by mouth in the evening.  ?  ? Endocrinology:  Diabetes - Sulfonylureas Failed - 07/04/2021  6:44 PM  ?  ?  Failed - HBA1C is between 0 and 7.9 and within 180 days  ?  HbA1c, POC (controlled diabetic range)  ?Date Value Ref Range Status  ?04/02/2021 8.4 (A) 0.0 - 7.0 % Final  ?   ?  ?  Passed - Cr in normal range and within 360 days  ?  Creat  ?Date Value Ref Range Status  ?12/03/2015 0.85 0.70 - 1.33 mg/dL Final  ?  Comment:  ?    ?For patients > or = 59 years of age: The upper reference limit for ?Creatinine is approximately 13% higher for people identified as ?African-American. ?  ?  ? ?Creatinine, Ser  ?Date Value Ref Range Status  ?04/02/2021 1.01 0.76 - 1.27 mg/dL Final  ? ?Creatinine, POC  ?Date Value Ref Range Status  ?08/14/2016 200 mg/dL Final  ?   ?  ?  Passed - Valid encounter within last 6 months  ?  Recent Outpatient Visits   ?      ? 3 months ago Type 2 diabetes mellitus with diabetic polyneuropathy, with long-term current use of insulin (HCC)  ? [facility] [place], [place], [place]  ? 4 months ago Type 2 diabetes mellitus with diabetic polyneuropathy, with long-term current use of insulin (HCC)  ? [facility], Lois [place], RPH-CPP  ? 5 months ago Type 2 diabetes mellitus with diabetic polyneuropathy, with long-term current use of insulin (HCC)  ? [facility], Lois Huxley, RPH-CPP  ? 6 months ago Type 2 diabetes mellitus with diabetic polyneuropathy, with long-term current use of insulin (HCC)  ? [facility], Lois Huxley, RPH-CPP  ? 7 months ago Type 2 diabetes mellitus with diabetic polyneuropathy, with long-term current use of insulin (HCC)  ? [facility], MD  ?  ?  ?Future Appointments   ?        ? In 3 months Hoy Register, MD [facility]  ?  ? ?  ?  ?  ? ?

## 2021-07-12 ENCOUNTER — Other Ambulatory Visit: Payer: Self-pay

## 2021-07-18 ENCOUNTER — Other Ambulatory Visit: Payer: Self-pay

## 2021-07-19 ENCOUNTER — Other Ambulatory Visit: Payer: Self-pay | Admitting: Pharmacist

## 2021-07-19 ENCOUNTER — Telehealth: Payer: Self-pay | Admitting: Family Medicine

## 2021-07-19 ENCOUNTER — Other Ambulatory Visit: Payer: Self-pay

## 2021-07-19 MED ORDER — EMPAGLIFLOZIN 25 MG PO TABS
25.0000 mg | ORAL_TABLET | Freq: Every day | ORAL | 3 refills | Status: DC
  Filled 2021-07-19: qty 30, 30d supply, fill #0
  Filled 2021-07-19: qty 14, 14d supply, fill #0
  Filled 2021-08-01: qty 14, 14d supply, fill #1

## 2021-07-19 NOTE — Telephone Encounter (Signed)
Kelly from pharmacy will reach out to patient in regards to this matter.

## 2021-07-19 NOTE — Telephone Encounter (Signed)
Copied from CRM [id number]. Topic: General - Other >> Jul 18, 2021  5:48 PM Randol Kern wrote: Pt called requesting [employer] [place] and [place] program in [place] stating that it is okay to continue sending his medication since he is low income and would qualify.   Farxiga 10 MG 30 tablets (usually receives 4-5 bottles in the mail)   Fax: [phone]

## 2021-07-23 ENCOUNTER — Ambulatory Visit: Payer: Self-pay | Admitting: Family Medicine

## 2021-07-26 ENCOUNTER — Other Ambulatory Visit: Payer: Self-pay

## 2021-07-31 ENCOUNTER — Other Ambulatory Visit: Payer: Self-pay

## 2021-08-01 ENCOUNTER — Other Ambulatory Visit: Payer: Self-pay | Admitting: Family Medicine

## 2021-08-01 DIAGNOSIS — Z794 Long term (current) use of insulin: Secondary | ICD-10-CM

## 2021-08-01 MED ORDER — INSULIN DETEMIR 100 UNIT/ML ~~LOC~~ SOLN
40.0000 [IU] | Freq: Two times a day (BID) | SUBCUTANEOUS | 2 refills | Status: DC
  Filled 2021-08-01: qty 30, 38d supply, fill #0
  Filled 2021-09-08: qty 30, 38d supply, fill #1
  Filled 2021-10-14: qty 30, 38d supply, fill #2

## 2021-08-01 MED ORDER — "INSULIN SYRINGE-NEEDLE U-100 30G X 5/16"" 0.5 ML MISC"
1 refills | Status: DC
  Filled 2021-08-01: qty 100, 50d supply, fill #0
  Filled 2021-10-31: qty 100, 50d supply, fill #1

## 2021-08-02 ENCOUNTER — Other Ambulatory Visit: Payer: Self-pay

## 2021-08-08 ENCOUNTER — Other Ambulatory Visit: Payer: Self-pay

## 2021-08-09 ENCOUNTER — Other Ambulatory Visit: Payer: Self-pay

## 2021-08-12 ENCOUNTER — Other Ambulatory Visit: Payer: Self-pay

## 2021-08-20 ENCOUNTER — Other Ambulatory Visit: Payer: Self-pay

## 2021-08-29 ENCOUNTER — Other Ambulatory Visit: Payer: Self-pay

## 2021-08-30 ENCOUNTER — Other Ambulatory Visit: Payer: Self-pay

## 2021-09-06 ENCOUNTER — Other Ambulatory Visit: Payer: Self-pay

## 2021-09-09 ENCOUNTER — Other Ambulatory Visit: Payer: Self-pay

## 2021-10-03 ENCOUNTER — Other Ambulatory Visit: Payer: Self-pay

## 2021-10-03 ENCOUNTER — Other Ambulatory Visit: Payer: Self-pay | Admitting: Family Medicine

## 2021-10-03 DIAGNOSIS — E1142 Type 2 diabetes mellitus with diabetic polyneuropathy: Secondary | ICD-10-CM

## 2021-10-04 ENCOUNTER — Other Ambulatory Visit: Payer: Self-pay

## 2021-10-04 ENCOUNTER — Other Ambulatory Visit: Payer: Self-pay | Admitting: Family Medicine

## 2021-10-04 DIAGNOSIS — E1169 Type 2 diabetes mellitus with other specified complication: Secondary | ICD-10-CM

## 2021-10-04 DIAGNOSIS — Z794 Long term (current) use of insulin: Secondary | ICD-10-CM

## 2021-10-04 MED ORDER — GABAPENTIN 300 MG PO CAPS
ORAL_CAPSULE | Freq: Three times a day (TID) | ORAL | 6 refills | Status: DC
Start: 2021-10-04 — End: 2022-04-17
  Filled 2021-10-04: qty 90, 30d supply, fill #0
  Filled 2021-10-31: qty 90, 30d supply, fill #1
  Filled 2021-11-28: qty 90, 30d supply, fill #2
  Filled 2021-12-29: qty 90, 30d supply, fill #3
  Filled 2022-01-30: qty 90, 30d supply, fill #4
  Filled 2022-03-06: qty 90, 30d supply, fill #5
  Filled 2022-04-03: qty 90, 30d supply, fill #6

## 2021-10-04 MED ORDER — OMEGA-3-ACID ETHYL ESTERS 1 G PO CAPS
1.0000 | ORAL_CAPSULE | Freq: Two times a day (BID) | ORAL | 0 refills | Status: DC
  Filled 2021-10-04: qty 60, 30d supply, fill #0

## 2021-10-04 MED ORDER — METFORMIN HCL 1000 MG PO TABS
ORAL_TABLET | Freq: Two times a day (BID) | ORAL | 0 refills | Status: DC
  Filled 2021-10-04: qty 60, 30d supply, fill #0

## 2021-10-04 NOTE — Telephone Encounter (Signed)
Requested medications are due for refill today.  yes  Requested medications are on the active medications list.  yes  Last refill. Both refilled 04/02/2021 #180 1 refill  Future visit scheduled.   yes  Notes to clinic.  Both failed protocol d/t expired labs.    Requested Prescriptions  Pending Prescriptions Disp Refills   metFORMIN (GLUCOPHAGE) 1000 MG tablet 180 tablet 1    Sig: TAKE 1 TABLET (1,000 MG TOTAL) BY MOUTH 2 (TWO) TIMES DAILY WITH A MEAL.     Endocrinology:  Diabetes - Biguanides Failed - 10/04/2021  3:18 PM      Failed - HBA1C is between 0 and 7.9 and within 180 days    HbA1c, POC (controlled diabetic range)  Date Value Ref Range Status  04/02/2021 8.4 (A) 0.0 - 7.0 % Final         Failed - B12 Level in normal range and within 720 days    No results found for: "VITAMINB12"       Failed - Valid encounter within last 6 months    Recent Outpatient Visits           6 months ago Type 2 diabetes mellitus with diabetic polyneuropathy, with long-term current use of insulin ([place])   [place], Charlane Ferretti, MD   7 months ago Type 2 diabetes mellitus with diabetic polyneuropathy, with long-term current use of insulin ([place])   [place], Annie Main L, RPH-CPP   8 months ago Type 2 diabetes mellitus with diabetic polyneuropathy, with long-term current use of insulin ([place])   [place], Annie Main L, RPH-CPP   9 months ago Type 2 diabetes mellitus with diabetic polyneuropathy, with long-term current use of insulin ([place])   [place], Jarome Matin, RPH-CPP   10 months ago Type 2 diabetes mellitus with diabetic polyneuropathy, with long-term current use of insulin ([place])   [place], Charlane Ferretti, MD       Future Appointments             In 4 days Charlott Rakes, MD [place]            Failed - CBC within normal limits and completed in the last 12 months    WBC  Date Value Ref Range Status  12/03/2015 7.4 3.8 - 10.8 K/uL Final   RBC  Date Value Ref Range Status  12/03/2015 4.17 (L) 4.20 - 5.80 MIL/uL Final   Hemoglobin  Date Value Ref Range Status  12/03/2015 12.6 (L) 13.2 - 17.1 g/dL Final   HCT  Date Value Ref Range Status  12/03/2015 38.9 38.5 - 50.0 % Final   MCHC  Date Value Ref Range Status  12/03/2015 32.4 32.0 - 36.0 g/dL Final   [facility]  Date Value Ref Range Status  12/03/2015 30.2 27.0 - 33.0 pg Final   MCV  Date Value Ref Range Status  12/03/2015 93.3 80.0 - 100.0 fL Final   No results found for: "PLTCOUNTKUC", "LABPLAT", "POCPLA" RDW  Date Value Ref Range Status  12/03/2015 13.1 11.0 - 15.0 % Final         Passed - Cr in normal range and within 360 days    Creat  Date Value Ref Range Status  12/03/2015 0.85 0.70 - 1.33 mg/dL Final    Comment:  For patients > or = 59 years of age: The upper reference limit for Creatinine is approximately 13% higher for people identified as African-American.      Creatinine, Ser  Date Value Ref Range Status  04/02/2021 1.01 0.76 - 1.27 mg/dL Final   Creatinine, POC  Date Value Ref Range Status  08/14/2016 200 mg/dL Final         Passed - eGFR in normal range and within 360 days    GFR, Est African American  Date Value Ref Range Status  12/03/2015 >89 >=60 mL/min Final   GFR calc Af Amer  Date Value Ref Range Status  02/14/2020 94 >59 mL/min/1.73 Final    Comment:    **In accordance with recommendations from the NKF-ASN Task force,**   [facility] is in the process of updating its eGFR calculation to the   2021 CKD-EPI creatinine equation that estimates kidney function   without a race variable.    GFR, Est Non African American  Date Value Ref Range Status  12/03/2015 >89 >=60 mL/min Final   GFR calc non Af Amer  Date Value Ref Range Status   02/14/2020 81 >59 mL/min/1.73 Final   eGFR  Date Value Ref Range Status  04/02/2021 86 >59 mL/min/1.73 Final          omega-3 acid ethyl esters (LOVAZA) 1 g capsule 180 capsule 1    Sig: TAKE 1 CAPSULE BY MOUTH 2 TIMES DAILY.     Endocrinology:  Nutritional Agents - omega-3 acid ethyl esters Failed - 10/04/2021  3:18 PM      Failed - Lipid Panel in normal range within the last 12 months    Cholesterol, Total  Date Value Ref Range Status  08/17/2020 208 (H) 100 - 199 mg/dL Final   LDL Chol Calc (NIH)  Date Value Ref Range Status  08/17/2020 130 (H) 0 - 99 mg/dL Final   HDL  Date Value Ref Range Status  08/17/2020 47 >39 mg/dL Final   Triglycerides  Date Value Ref Range Status  08/17/2020 174 (H) 0 - 149 mg/dL Final         Passed - Valid encounter within last 12 months    Recent Outpatient Visits           6 months ago Type 2 diabetes mellitus with diabetic polyneuropathy, with long-term current use of insulin ([place])   [place], [place], MD   7 months ago Type 2 diabetes mellitus with diabetic polyneuropathy, with long-term current use of insulin ([place])   [place], Annie Main L, RPH-CPP   8 months ago Type 2 diabetes mellitus with diabetic polyneuropathy, with long-term current use of insulin [facility])   [place], Annie Main L, RPH-CPP   9 months ago Type 2 diabetes mellitus with diabetic polyneuropathy, with long-term current use of insulin [facility]   [place], Annie Main L, RPH-CPP   10 months ago Type 2 diabetes mellitus with diabetic polyneuropathy, with long-term current use of insulin ([place])   [place], [name], MD       Future Appointments             In 4 days Charlott Rakes, MD [place]            Refused Prescriptions Disp  Refills   glipiZIDE (GLUCOTROL) 10 MG tablet 45  tablet 2    Sig: TAKE 1 TABLET (10 MG TOTAL) BY MOUTH in the morning and 0.5 tablet (72m total) by mouth in the evening.     Endocrinology:  Diabetes - Sulfonylureas Failed - 10/04/2021  3:18 PM      Failed - HBA1C is between 0 and 7.9 and within 180 days    HbA1c, POC (controlled diabetic range)  Date Value Ref Range Status  04/02/2021 8.4 (A) 0.0 - 7.0 % Final         Failed - Valid encounter within last 6 months    Recent Outpatient Visits           6 months ago Type 2 diabetes mellitus with diabetic polyneuropathy, with long-term current use of insulin ([place]   [place] ECharlane Ferretti MD   7 months ago Type 2 diabetes mellitus with diabetic polyneuropathy, with long-term current use of insulin ([facility]   [place] SAnnie MainL, RPH-CPP   8 months ago Type 2 diabetes mellitus with diabetic polyneuropathy, with long-term current use of insulin ([facility]   [place] SAnnie MainL, RPH-CPP   9 months ago Type 2 diabetes mellitus with diabetic polyneuropathy, with long-term current use of insulin ([facility]   [place] SAnnie MainL, RPH-CPP   10 months ago Type 2 diabetes mellitus with diabetic polyneuropathy, with long-term current use of insulin ([place]   [place] ECharlane Ferretti MD       Future Appointments             In 4 days [place]Charlott Rakes MD [place]- Cr in normal range and within 360 days    Creat  Date Value Ref Range Status  12/03/2015 0.85 0.70 - 1.33 mg/dL Final    Comment:      For patients > or = 59years of age: The upper reference limit for Creatinine is approximately 13% higher for people identified as African-American.      Creatinine, Ser  Date Value Ref Range Status   04/02/2021 1.01 0.76 - 1.27 mg/dL Final   Creatinine, POC  Date Value Ref Range Status  08/14/2016 200 mg/dL Final

## 2021-10-04 NOTE — Telephone Encounter (Signed)
Refilled 07/05/2021 #45 3 refills. Requested Prescriptions  Pending Prescriptions Disp Refills  . metFORMIN (GLUCOPHAGE) 1000 MG tablet 180 tablet 1    Sig: TAKE 1 TABLET (1,000 MG TOTAL) BY MOUTH 2 (TWO) TIMES DAILY WITH A MEAL.     Endocrinology:  Diabetes - Biguanides Failed - 10/04/2021  3:18 PM      Failed - HBA1C is between 0 and 7.9 and within 180 days    HbA1c, POC (controlled diabetic range)  Date Value Ref Range Status  04/02/2021 8.4 (A) 0.0 - 7.0 % Final         Failed - B12 Level in normal range and within 720 days    No results found for: "VITAMINB12"       Failed - Valid encounter within last 6 months    Recent Outpatient Visits          6 months ago Type 2 diabetes mellitus with diabetic polyneuropathy, with long-term current use of insulin ([place])   [place], Charlane Ferretti, MD   7 months ago Type 2 diabetes mellitus with diabetic polyneuropathy, with long-term current use of insulin ([place])   [place], Annie Main L, RPH-CPP   8 months ago Type 2 diabetes mellitus with diabetic polyneuropathy, with long-term current use of insulin ([place])   [place], Annie Main L, RPH-CPP   9 months ago Type 2 diabetes mellitus with diabetic polyneuropathy, with long-term current use of insulin ([place])   [place], Jarome Matin, RPH-CPP   10 months ago Type 2 diabetes mellitus with diabetic polyneuropathy, with long-term current use of insulin ([place])   [place], Charlane Ferretti, MD      Future Appointments            In 4 days Charlott Rakes, MD [place]           Failed - CBC within normal limits and completed in the last 12 months    WBC  Date Value Ref Range Status  12/03/2015 7.4 3.8 - 10.8 K/uL Final   RBC  Date Value Ref Range Status  12/03/2015 4.17 (L)  4.20 - 5.80 MIL/uL Final   Hemoglobin  Date Value Ref Range Status  12/03/2015 12.6 (L) 13.2 - 17.1 g/dL Final   HCT  Date Value Ref Range Status  12/03/2015 38.9 38.5 - 50.0 % Final   MCHC  Date Value Ref Range Status  12/03/2015 32.4 32.0 - 36.0 g/dL Final   [facility]  Date Value Ref Range Status  12/03/2015 30.2 27.0 - 33.0 pg Final   MCV  Date Value Ref Range Status  12/03/2015 93.3 80.0 - 100.0 fL Final   No results found for: "PLTCOUNTKUC", "LABPLAT", "POCPLA" RDW  Date Value Ref Range Status  12/03/2015 13.1 11.0 - 15.0 % Final         Passed - Cr in normal range and within 360 days    Creat  Date Value Ref Range Status  12/03/2015 0.85 0.70 - 1.33 mg/dL Final    Comment:      For patients > or = 59 years of age: The upper reference limit for Creatinine is approximately 13% higher for people identified as African-American.      Creatinine, Ser  Date Value Ref Range Status  04/02/2021 1.01 0.76 - 1.27 mg/dL Final   Creatinine, POC  Date Value Ref Range Status  08/14/2016 200 mg/dL Final         Passed - eGFR in normal range and within 360 days    GFR, Est African American  Date Value Ref Range Status  12/03/2015 >89 >=60 mL/min Final   GFR calc Af Amer  Date Value Ref Range Status  02/14/2020 94 >59 mL/min/1.73 Final    Comment:    **In accordance with recommendations from the NKF-ASN Task force,**   [facility] is in the process of updating its eGFR calculation to the   2021 CKD-EPI creatinine equation that estimates kidney function   without a race variable.    GFR, Est Non African American  Date Value Ref Range Status  12/03/2015 >89 >=60 mL/min Final   GFR calc non Af Amer  Date Value Ref Range Status  02/14/2020 81 >59 mL/min/1.73 Final   eGFR  Date Value Ref Range Status  04/02/2021 86 >59 mL/min/1.73 Final         . omega-3 acid ethyl esters (LOVAZA) 1 g capsule 180 capsule 1    Sig: TAKE 1 CAPSULE BY MOUTH 2 TIMES DAILY.      Endocrinology:  Nutritional Agents - omega-3 acid ethyl esters Failed - 10/04/2021  3:18 PM      Failed - Lipid Panel in normal range within the last 12 months    Cholesterol, Total  Date Value Ref Range Status  08/17/2020 208 (H) 100 - 199 mg/dL Final   LDL Chol Calc (NIH)  Date Value Ref Range Status  08/17/2020 130 (H) 0 - 99 mg/dL Final   HDL  Date Value Ref Range Status  08/17/2020 47 >39 mg/dL Final   Triglycerides  Date Value Ref Range Status  08/17/2020 174 (H) 0 - 149 mg/dL Final         Passed - Valid encounter within last 12 months    Recent Outpatient Visits          6 months ago Type 2 diabetes mellitus with diabetic polyneuropathy, with long-term current use of insulin ([place])   [place], [place], MD   7 months ago Type 2 diabetes mellitus with diabetic polyneuropathy, with long-term current use of insulin ([place])   [place], Annie Main L, RPH-CPP   8 months ago Type 2 diabetes mellitus with diabetic polyneuropathy, with long-term current use of insulin [facility]   [place], Annie Main L, RPH-CPP   9 months ago Type 2 diabetes mellitus with diabetic polyneuropathy, with long-term current use of insulin [facility])   [place], Annie Main L, RPH-CPP   10 months ago Type 2 diabetes mellitus with diabetic polyneuropathy, with long-term current use of insulin ([place])   [place], [name], MD      Future Appointments            In 4 days Charlott Rakes, MD [place]           . glipiZIDE (GLUCOTROL) 10 MG tablet 45 tablet 2    Sig: TAKE 1 TABLET (10 MG TOTAL) BY MOUTH in the morning and 0.5 tablet (49m total) by mouth in the evening.     Endocrinology:  Diabetes - Sulfonylureas Failed - 10/04/2021  3:18 PM      Failed - HBA1C is between 0 and 7.9  and within 180 days  HbA1c, POC (controlled diabetic range)  Date Value Ref Range Status  04/02/2021 8.4 (A) 0.0 - 7.0 % Final         Failed - Valid encounter within last 6 months    Recent Outpatient Visits          6 months ago Type 2 diabetes mellitus with diabetic polyneuropathy, with long-term current use of insulin ([place])   [place], Charlane Ferretti, MD   7 months ago Type 2 diabetes mellitus with diabetic polyneuropathy, with long-term current use of insulin [facility])   [place], Annie Main L, RPH-CPP   8 months ago Type 2 diabetes mellitus with diabetic polyneuropathy, with long-term current use of insulin [facility])   [place], Annie Main L, RPH-CPP   9 months ago Type 2 diabetes mellitus with diabetic polyneuropathy, with long-term current use of insulin [facility])   [place], Annie Main L, RPH-CPP   10 months ago Type 2 diabetes mellitus with diabetic polyneuropathy, with long-term current use of insulin ([place])   [place], Charlane Ferretti, MD      Future Appointments            In 4 days Charlott Rakes, MD [place] - Cr in normal range and within 360 days    Creat  Date Value Ref Range Status  12/03/2015 0.85 0.70 - 1.33 mg/dL Final    Comment:      For patients > or = 59 years of age: The upper reference limit for Creatinine is approximately 13% higher for people identified as African-American.      Creatinine, Ser  Date Value Ref Range Status  04/02/2021 1.01 0.76 - 1.27 mg/dL Final   Creatinine, POC  Date Value Ref Range Status  08/14/2016 200 mg/dL Final

## 2021-10-07 ENCOUNTER — Other Ambulatory Visit: Payer: Self-pay

## 2021-10-08 ENCOUNTER — Ambulatory Visit: Payer: Self-pay | Attending: Family Medicine | Admitting: Family Medicine

## 2021-10-08 ENCOUNTER — Other Ambulatory Visit: Payer: Self-pay

## 2021-10-08 ENCOUNTER — Encounter: Payer: Self-pay | Admitting: Family Medicine

## 2021-10-08 VITALS — BP 118/73 | HR 70 | Temp 98.1°F | Ht 75.0 in | Wt 357.8 lb

## 2021-10-08 DIAGNOSIS — M791 Myalgia, unspecified site: Secondary | ICD-10-CM

## 2021-10-08 DIAGNOSIS — N401 Enlarged prostate with lower urinary tract symptoms: Secondary | ICD-10-CM

## 2021-10-08 DIAGNOSIS — Z1211 Encounter for screening for malignant neoplasm of colon: Secondary | ICD-10-CM

## 2021-10-08 DIAGNOSIS — R351 Nocturia: Secondary | ICD-10-CM

## 2021-10-08 DIAGNOSIS — I8392 Asymptomatic varicose veins of left lower extremity: Secondary | ICD-10-CM

## 2021-10-08 DIAGNOSIS — E1142 Type 2 diabetes mellitus with diabetic polyneuropathy: Secondary | ICD-10-CM

## 2021-10-08 DIAGNOSIS — I152 Hypertension secondary to endocrine disorders: Secondary | ICD-10-CM

## 2021-10-08 DIAGNOSIS — Z794 Long term (current) use of insulin: Secondary | ICD-10-CM

## 2021-10-08 DIAGNOSIS — E1159 Type 2 diabetes mellitus with other circulatory complications: Secondary | ICD-10-CM

## 2021-10-08 DIAGNOSIS — E785 Hyperlipidemia, unspecified: Secondary | ICD-10-CM

## 2021-10-08 DIAGNOSIS — E1169 Type 2 diabetes mellitus with other specified complication: Secondary | ICD-10-CM

## 2021-10-08 LAB — POCT GLYCOSYLATED HEMOGLOBIN (HGB A1C): HbA1c, POC (controlled diabetic range): 7.6 % — AB (ref 0.0–7.0)

## 2021-10-08 LAB — GLUCOSE, POCT (MANUAL RESULT ENTRY): POC Glucose: 98 mg/dl (ref 70–99)

## 2021-10-08 MED ORDER — OMEGA-3-ACID ETHYL ESTERS 1 G PO CAPS
1.0000 | ORAL_CAPSULE | Freq: Two times a day (BID) | ORAL | 0 refills | Status: DC
  Filled 2021-10-08 – 2021-11-06 (×2): qty 60, 30d supply, fill #0

## 2021-10-08 MED ORDER — TAMSULOSIN HCL 0.4 MG PO CAPS
ORAL_CAPSULE | Freq: Every day | ORAL | 1 refills | Status: DC
  Filled 2021-10-08: qty 90, fill #0
  Filled 2021-10-31: qty 90, 90d supply, fill #0
  Filled 2022-01-30: qty 90, 90d supply, fill #1

## 2021-10-08 MED ORDER — AMLODIPINE BESYLATE 5 MG PO TABS
ORAL_TABLET | Freq: Every day | ORAL | 1 refills | Status: DC
  Filled 2021-10-08: qty 90, fill #0
  Filled 2021-10-18: qty 90, 90d supply, fill #0
  Filled 2022-01-30 – 2022-01-31 (×2): qty 90, 90d supply, fill #1

## 2021-10-08 MED ORDER — FINASTERIDE 5 MG PO TABS
5.0000 mg | ORAL_TABLET | Freq: Every day | ORAL | 1 refills | Status: DC
  Filled 2021-10-08: qty 30, 30d supply, fill #0
  Filled 2021-10-09: qty 30, 30d supply, fill #1
  Filled 2021-12-12: qty 30, 30d supply, fill #2
  Filled 2022-01-09: qty 30, 30d supply, fill #3
  Filled 2022-02-06: qty 30, 30d supply, fill #4
  Filled 2022-03-10: qty 30, 30d supply, fill #5

## 2021-10-08 MED ORDER — EZETIMIBE 10 MG PO TABS
ORAL_TABLET | Freq: Every day | ORAL | 1 refills | Status: DC
  Filled 2021-10-08: qty 90, fill #0
  Filled 2021-10-09: qty 30, 30d supply, fill #0
  Filled 2021-11-06: qty 30, 30d supply, fill #1
  Filled 2021-11-28: qty 30, 30d supply, fill #2
  Filled 2022-01-09: qty 30, 30d supply, fill #3
  Filled 2022-02-06: qty 30, 30d supply, fill #4
  Filled 2022-03-10: qty 30, 30d supply, fill #5

## 2021-10-08 MED ORDER — GLIPIZIDE 10 MG PO TABS
ORAL_TABLET | ORAL | 6 refills | Status: DC
  Filled 2021-10-08: qty 45, 30d supply, fill #0
  Filled 2021-11-06: qty 45, 30d supply, fill #1
  Filled 2021-12-12: qty 45, 30d supply, fill #2
  Filled 2022-01-09: qty 45, 30d supply, fill #3
  Filled 2022-02-06: qty 45, 30d supply, fill #4
  Filled 2022-03-10: qty 45, 30d supply, fill #5
  Filled 2022-04-17: qty 45, 30d supply, fill #6

## 2021-10-08 MED ORDER — METFORMIN HCL 1000 MG PO TABS
ORAL_TABLET | Freq: Two times a day (BID) | ORAL | 1 refills | Status: DC
  Filled 2021-10-08: qty 180, fill #0
  Filled 2021-11-06: qty 180, 90d supply, fill #0
  Filled 2022-03-10: qty 180, 90d supply, fill #1

## 2021-10-08 NOTE — Progress Notes (Unsigned)
Pt would like to discuss muscle tightness in upper legs.

## 2021-10-08 NOTE — Progress Notes (Unsigned)
Subjective:  Patient ID: Dillon Mcgee, male    DOB: 1960-05-04  Age: 61 y.o. MRN: [id number]  CC: Diabetes   HPI Doran Nestle is a 61 y.o. year old male with a history of hypertension, type 2 diabetes mellitus (A1c 8.4), hyperlipidemia, statin intolerance, obesity here for follow-up visit.    Interval History:  Blood sugars have been 87- 123 fasting and  Farxiga appears on his med list but he has not been taking it. He continues to have tightness in his legs even though he has been off stains for several months. Symptoms are worse in the morning. He takes Mobic for his symptoms for relie. He declines Physical Therapy referral due to financial. Past Medical History:  Diagnosis Date   Diabetes mellitus without complication ([place])    Hyperlipidemia    Hypertension     Past Surgical History:  Procedure Laterality Date   COLONOSCOPY     5 years ago     Family History  Problem Relation Age of Onset   Heart disease Maternal Aunt    Diabetes Maternal Uncle     Social History   Socioeconomic History   Marital status: Single    Spouse name: Not on file   Number of children: Not on file   Years of education: Not on file   Highest education level: Not on file  Occupational History   Not on file  Tobacco Use   Smoking status: Never   Smokeless tobacco: Never  Vaping Use   Vaping Use: Never used  Substance and Sexual Activity   Alcohol use: No    Alcohol/week: 0.0 standard drinks of alcohol   Drug use: Not on file   Sexual activity: Not on file  Other Topics Concern   Not on file  Social History Narrative   Not on file   Social Determinants of Health   Financial Resource Strain: Not on file  Food Insecurity: Not on file  Transportation Needs: Not on file  Physical Activity: Not on file  Stress: Not on file  Social Connections: Not on file    Allergies  Allergen Reactions   Lisinopril Other (See Comments)    Hyperkalemia    Pioglitazone Swelling     Lower extremity swelling.    Outpatient Medications Prior to Visit  Medication Sig Dispense Refill   amLODipine (NORVASC) 5 MG tablet TAKE 1 TABLET (5 MG TOTAL) BY MOUTH DAILY. 90 tablet 1   aspirin EC 81 MG tablet Take 1 tablet (81 mg total) daily by mouth. 90 tablet 3   Blood Glucose Monitoring Suppl (TRUE METRIX GO GLUCOSE METER) w/Device KIT 1 each by Does not apply route every 8 (eight) hours as needed. 1 kit 0   calcipotriene-betamethasone (TACLONEX) ointment Apply ointment to affected area(s) daily. 100 g 1   Dulaglutide (TRULICITY) 4.5 WG/9.5AO SOPN Inject 4.5 mg as directed once a week. 2 mL 3   ezetimibe (ZETIA) 10 MG tablet TAKE 1 TABLET (10 MG TOTAL) BY MOUTH DAILY. 90 tablet 1   ferrous sulfate 325 (65 FE) MG tablet Take 325 mg by mouth daily with breakfast.     finasteride (PROSCAR) 5 MG tablet Take 1 tablet (5 mg total) by mouth daily. 90 tablet 1   gabapentin (NEURONTIN) 300 MG capsule TAKE 1 CAPSULE (300 MG TOTAL) BY MOUTH 3 (THREE) TIMES DAILY. 90 capsule 6   glipiZIDE (GLUCOTROL) 10 MG tablet TAKE 1 TABLET (10 MG TOTAL) BY MOUTH in the morning and 0.5 tablet (73m  total) by mouth in the evening. 45 tablet 2   glucose blood test strip Use as instructed 3 times daily. 100 strip 12   insulin detemir (LEVEMIR) 100 UNIT/ML injection Inject 0.4 mLs (40 Units total) into the skin 2 (two) times daily. 30 mL 2   metFORMIN (GLUCOPHAGE) 1000 MG tablet TAKE 1 TABLET (1,000 MG TOTAL) BY MOUTH 2 (TWO) TIMES DAILY WITH A MEAL. 60 tablet 0   omega-3 acid ethyl esters (LOVAZA) 1 g capsule TAKE 1 CAPSULE BY MOUTH 2 TIMES DAILY. 60 capsule 0   tamsulosin (FLOMAX) 0.4 MG CAPS capsule TAKE 1 CAPSULE (0.4 MG TOTAL) BY MOUTH DAILY. 90 capsule 1   TRUEPLUS LANCETS 26G MISC 1 each by Does not apply route every 8 (eight) hours as needed. 100 each 12   TRUEPLUS PEN NEEDLES 32G X 4 MM MISC USE AS DIRECTED ONCE DAILY 100 each 6   empagliflozin (JARDIANCE) 25 MG TABS tablet Take 1 tablet (25 mg total) by  mouth daily before breakfast. (Patient not taking: Reported on 10/08/2021) 30 tablet 3   Insulin Syringe-Needle U-100 (TRUEPLUS INSULIN SYRINGE) 30G X 5/16" 0.5 ML MISC Use as directed to inject Levemir twice daily. (Patient not taking: Reported on 10/08/2021) 100 each 1   rosuvastatin (CRESTOR) 5 MG tablet Take 1 tablet (5 mg total) by mouth daily. (Patient not taking: Reported on 10/08/2021) 30 tablet 2   No facility-administered medications prior to visit.     ROS Review of Systems  Constitutional:  Negative for activity change and appetite change.  HENT:  Negative for sinus pressure and sore throat.   Respiratory:  Negative for chest tightness, shortness of breath and wheezing.   Cardiovascular:  Negative for chest pain and palpitations.  Gastrointestinal:  Negative for abdominal distention, abdominal pain and constipation.  Genitourinary: Negative.   Musculoskeletal: Negative.   Psychiatric/Behavioral:  Negative for behavioral problems and dysphoric mood.    *** Objective:  BP 118/73   Pulse 70   Temp 98.1 F (36.7 C) (Oral)   Ht '6\' 3"'  (1.905 m)   Wt (!) 357 lb 12.8 oz (162.3 kg)   SpO2 97%   BMI 44.72 kg/m      10/08/2021    2:00 PM 04/02/2021    8:33 AM 11/27/2020    8:34 AM  BP/Weight  Systolic BP 628 315 176  Diastolic BP 73 70 70  Wt. (Lbs) 357.8 351 354.8  BMI 44.72 kg/m2 42.73 kg/m2 43.19 kg/m2      Physical Exam Constitutional:      Appearance: He is well-developed. He is obese.  Cardiovascular:     Rate and Rhythm: Normal rate.     Heart sounds: Normal heart sounds. No murmur heard. Pulmonary:     Effort: Pulmonary effort is normal.     Breath sounds: Normal breath sounds. No wheezing or rales.  Chest:     Chest wall: No tenderness.  Abdominal:     General: Bowel sounds are normal. There is distension.     Palpations: Abdomen is soft. There is no mass.     Tenderness: There is no abdominal tenderness.  Musculoskeletal:        General: Normal range of  motion.     Right lower leg: No edema.     Left lower leg: No edema.  Neurological:     Mental Status: He is alert and oriented to person, place, and time.  Psychiatric:        Mood and Affect: Mood  normal.    ***    Latest Ref Rng & Units 04/02/2021    9:29 AM 08/17/2020    9:37 AM 02/14/2020    9:14 AM  CMP  Glucose 70 - 99 mg/dL 109  93  122   BUN 6 - 24 mg/dL '14  19  19   ' Creatinine 0.76 - 1.27 mg/dL 1.01  1.26  1.02   Sodium 134 - 144 mmol/L 147  145  142   Potassium 3.5 - 5.2 mmol/L 5.0  5.0  5.1   Chloride 96 - 106 mmol/L 105  106  102   CO2 20 - 29 mmol/L '26  24  28   ' Calcium 8.7 - 10.2 mg/dL 10.0  9.3  9.2   Total Protein 6.0 - 8.5 g/dL 7.3  6.9    Total Bilirubin 0.0 - 1.2 mg/dL 0.4  0.3    Alkaline Phos 44 - 121 IU/L 94  94    AST 0 - 40 IU/L 15  24    ALT 0 - 44 IU/L 12  16      Lipid Panel     Component Value Date/Time   CHOL 208 (H) 08/17/2020 0937   TRIG 174 (H) 08/17/2020 0937   HDL 47 08/17/2020 0937   CHOLHDL 4.4 08/17/2020 0937   CHOLHDL 3.5 06/07/2015 1005   VLDL 33 (H) 06/07/2015 1005   LDLCALC 130 (H) 08/17/2020 0937    CBC    Component Value Date/Time   WBC 7.4 12/03/2015 1704   RBC 4.17 (L) 12/03/2015 1704   HGB 12.6 (L) 12/03/2015 1704   HCT 38.9 12/03/2015 1704   PLT 288 12/03/2015 1704   MCV 93.3 12/03/2015 1704   MCH 30.2 12/03/2015 1704   MCHC 32.4 12/03/2015 1704   RDW 13.1 12/03/2015 1704   LYMPHSABS 2,294 12/03/2015 1704   MONOABS 518 12/03/2015 1704   EOSABS 148 12/03/2015 1704   BASOSABS 74 12/03/2015 1704    Lab Results  Component Value Date   HGBA1C 8.4 (A) 04/02/2021    The 10-year ASCVD risk score (Arnett DK, et al., 2019) is: 16.2%   Values used to calculate the score:     Age: 24 years     Sex: Male     Is Non-Hispanic African American: No     Diabetic: Yes     Tobacco smoker: No     Systolic Blood Pressure: 276 mmHg     Is BP treated: Yes     HDL Cholesterol: 47 mg/dL     Total Cholesterol: 208  mg/dL   Assessment & Plan:  There are no diagnoses linked to this encounter.  Health Care Maintenance: *** No orders of the defined types were placed in this encounter.   Follow-up: Return in about 3 months (around 01/08/2022) for Chronic medical conditions.       Charlott Rakes, MD, FAAFP. [facility] [place], [place]   10/08/2021, 2:22 PM

## 2021-10-09 ENCOUNTER — Other Ambulatory Visit: Payer: Self-pay

## 2021-10-09 ENCOUNTER — Encounter: Payer: Self-pay | Admitting: Family Medicine

## 2021-10-09 ENCOUNTER — Other Ambulatory Visit: Payer: Self-pay | Admitting: Family Medicine

## 2021-10-09 LAB — MICROALBUMIN / CREATININE URINE RATIO
Creatinine, Urine: 61.2 mg/dL
Microalb/Creat Ratio: 55 mg/g creat — ABNORMAL HIGH (ref 0–29)
Microalbumin, Urine: 33.4 ug/mL

## 2021-10-09 MED ORDER — MELOXICAM 15 MG PO TABS
15.0000 mg | ORAL_TABLET | Freq: Every day | ORAL | 3 refills | Status: DC
Start: 2021-10-09 — End: 2022-02-06
  Filled 2021-10-09: qty 30, 30d supply, fill #0
  Filled 2021-11-06: qty 30, 30d supply, fill #1
  Filled 2021-12-12: qty 30, 30d supply, fill #2
  Filled 2022-01-09: qty 30, 30d supply, fill #3

## 2021-10-09 NOTE — Telephone Encounter (Signed)
Requested medication (s) are due for refill today:   No  Requested medication (s) are on the active medication list:   No  Future visit scheduled:   Yes in 6 months with Dr. Margarita Rana   Last ordered: 10/03/2021 refused because refill not appropriate reason given.    It's not on his medication list either.   H & H are due per protocol.  Requested Prescriptions  Pending Prescriptions Disp Refills   meloxicam (MOBIC) 15 MG tablet 30 tablet 3    Sig: Take 1 tablet (15 mg total) by mouth daily.     Analgesics:  COX2 Inhibitors Failed - 10/09/2021  1:08 PM      Failed - Manual Review: Labs are only required if the patient has taken medication for more than 8 weeks.      Failed - HGB in normal range and within 360 days    Hemoglobin  Date Value Ref Range Status  12/03/2015 12.6 (L) 13.2 - 17.1 g/dL Final         Failed - HCT in normal range and within 360 days    HCT  Date Value Ref Range Status  12/03/2015 38.9 38.5 - 50.0 % Final         Passed - Cr in normal range and within 360 days    Creat  Date Value Ref Range Status  12/03/2015 0.85 0.70 - 1.33 mg/dL Final    Comment:      For patients > or = 59 years of age: The upper reference limit for Creatinine is approximately 13% higher for people identified as African-American.      Creatinine, Ser  Date Value Ref Range Status  04/02/2021 1.01 0.76 - 1.27 mg/dL Final   Creatinine, POC  Date Value Ref Range Status  08/14/2016 200 mg/dL Final         Passed - AST in normal range and within 360 days    AST  Date Value Ref Range Status  04/02/2021 15 0 - 40 IU/L Final         Passed - ALT in normal range and within 360 days    ALT  Date Value Ref Range Status  04/02/2021 12 0 - 44 IU/L Final         Passed - eGFR is 30 or above and within 360 days    GFR, Est African American  Date Value Ref Range Status  12/03/2015 >89 >=60 mL/min Final   GFR calc Af Amer  Date Value Ref Range Status  02/14/2020 94 >59 mL/min/1.73  Final    Comment:    **In accordance with recommendations from the NKF-ASN Task force,**   [facility] is in the process of updating its eGFR calculation to the   2021 CKD-EPI creatinine equation that estimates kidney function   without a race variable.    GFR, Est Non African American  Date Value Ref Range Status  12/03/2015 >89 >=60 mL/min Final   GFR calc non Af Amer  Date Value Ref Range Status  02/14/2020 81 >59 mL/min/1.73 Final   eGFR  Date Value Ref Range Status  04/02/2021 86 >59 mL/min/1.73 Final         Passed - Patient is not pregnant      Passed - Valid encounter within last 12 months    Recent Outpatient Visits           Yesterday Type 2 diabetes mellitus with diabetic polyneuropathy, with long-term current use of insulin ([place])  [place], [place], MD   6 months ago Type 2 diabetes mellitus with diabetic polyneuropathy, with long-term current use of insulin ([place])   [place], [place], MD   7 months ago Type 2 diabetes mellitus with diabetic polyneuropathy, with long-term current use of insulin [facility])   [place], Annie Main L, RPH-CPP   8 months ago Type 2 diabetes mellitus with diabetic polyneuropathy, with long-term current use of insulin [facility])   [place], Annie Main L, RPH-CPP   9 months ago Type 2 diabetes mellitus with diabetic polyneuropathy, with long-term current use of insulin [facility])   [place], RPH-CPP       Future Appointments             In 6 months Charlott Rakes, MD [place]

## 2021-10-11 ENCOUNTER — Other Ambulatory Visit: Payer: Self-pay | Admitting: Family Medicine

## 2021-10-11 ENCOUNTER — Other Ambulatory Visit: Payer: Self-pay

## 2021-10-11 MED ORDER — DAPAGLIFLOZIN PROPANEDIOL 10 MG PO TABS
10.0000 mg | ORAL_TABLET | Freq: Every day | ORAL | 6 refills | Status: DC
  Filled 2021-10-11: qty 30, 30d supply, fill #0

## 2021-10-11 NOTE — Progress Notes (Unsigned)
                                                Information            Information           Information                                                                              Information                                                         My Note                    Note Details               Service:           Cosign Required?

## 2021-10-12 ENCOUNTER — Other Ambulatory Visit: Payer: Self-pay

## 2021-10-14 ENCOUNTER — Telehealth: Payer: Self-pay | Admitting: Family Medicine

## 2021-10-14 ENCOUNTER — Other Ambulatory Visit: Payer: Self-pay

## 2021-10-14 MED ORDER — EMPAGLIFLOZIN 25 MG PO TABS
25.0000 mg | ORAL_TABLET | Freq: Every day | ORAL | 1 refills | Status: DC
  Filled 2021-10-14: qty 90, 90d supply, fill #0
  Filled 2022-08-21: qty 30, 30d supply, fill #0
  Filled 2022-09-15: qty 30, 30d supply, fill #1

## 2021-10-14 NOTE — Telephone Encounter (Signed)
Per pharmacy chart and is free through the patient assistance program.  I have switched him back from [place] to [place].

## 2021-10-15 ENCOUNTER — Ambulatory Visit: Payer: Self-pay | Attending: Family Medicine

## 2021-10-15 ENCOUNTER — Other Ambulatory Visit: Payer: Self-pay

## 2021-10-15 DIAGNOSIS — Z794 Long term (current) use of insulin: Secondary | ICD-10-CM

## 2021-10-16 LAB — BASIC METABOLIC PANEL
BUN/Creatinine Ratio: 17 (ref 9–20)
BUN: 21 mg/dL (ref 6–24)
CO2: 24 mmol/L (ref 20–29)
Calcium: 9.8 mg/dL (ref 8.7–10.2)
Chloride: 106 mmol/L (ref 96–106)
Creatinine, Ser: 1.26 mg/dL (ref 0.76–1.27)
Glucose: 130 mg/dL — ABNORMAL HIGH (ref 70–99)
Potassium: 5 mmol/L (ref 3.5–5.2)
Sodium: 145 mmol/L — ABNORMAL HIGH (ref 134–144)
eGFR: 66 mL/min/{1.73_m2} (ref >59)

## 2021-10-16 LAB — LP+NON-HDL CHOLESTEROL
Cholesterol, Total: 207 mg/dL — ABNORMAL HIGH (ref 100–199)
HDL: 55 mg/dL (ref >39)
LDL Chol Calc (NIH): 128 mg/dL — ABNORMAL HIGH (ref 0–99)
Total Non-HDL-Chol (LDL+VLDL): 152 mg/dL — ABNORMAL HIGH (ref 0–129)
Triglycerides: 136 mg/dL (ref 0–149)
VLDL Cholesterol Cal: 24 mg/dL (ref 5–40)

## 2021-10-18 ENCOUNTER — Other Ambulatory Visit: Payer: Self-pay

## 2021-10-22 ENCOUNTER — Other Ambulatory Visit: Payer: Self-pay

## 2021-10-31 ENCOUNTER — Other Ambulatory Visit: Payer: Self-pay

## 2021-11-05 ENCOUNTER — Other Ambulatory Visit: Payer: Self-pay

## 2021-11-06 ENCOUNTER — Other Ambulatory Visit: Payer: Self-pay

## 2021-11-11 ENCOUNTER — Other Ambulatory Visit: Payer: Self-pay

## 2021-11-15 ENCOUNTER — Other Ambulatory Visit: Payer: Self-pay

## 2021-11-24 ENCOUNTER — Other Ambulatory Visit: Payer: Self-pay | Admitting: Family Medicine

## 2021-11-24 DIAGNOSIS — E1142 Type 2 diabetes mellitus with diabetic polyneuropathy: Secondary | ICD-10-CM

## 2021-11-25 ENCOUNTER — Other Ambulatory Visit: Payer: Self-pay

## 2021-11-25 MED ORDER — INSULIN DETEMIR 100 UNIT/ML ~~LOC~~ SOLN
40.0000 [IU] | Freq: Two times a day (BID) | SUBCUTANEOUS | 2 refills | Status: DC
  Filled 2021-11-25: qty 30, 38d supply, fill #0
  Filled 2021-12-29: qty 30, 38d supply, fill #1
  Filled 2022-01-31: qty 30, 38d supply, fill #2

## 2021-11-28 ENCOUNTER — Other Ambulatory Visit: Payer: Self-pay

## 2021-11-29 ENCOUNTER — Other Ambulatory Visit: Payer: Self-pay

## 2021-12-12 ENCOUNTER — Other Ambulatory Visit: Payer: Self-pay | Admitting: Family Medicine

## 2021-12-12 DIAGNOSIS — E1169 Type 2 diabetes mellitus with other specified complication: Secondary | ICD-10-CM

## 2021-12-13 ENCOUNTER — Other Ambulatory Visit: Payer: Self-pay

## 2021-12-13 MED ORDER — OMEGA-3-ACID ETHYL ESTERS 1 G PO CAPS
1.0000 | ORAL_CAPSULE | Freq: Two times a day (BID) | ORAL | 3 refills | Status: DC
  Filled 2021-12-13: qty 60, 30d supply, fill #0
  Filled 2022-01-09: qty 60, 30d supply, fill #1
  Filled 2022-02-06: qty 60, 30d supply, fill #2
  Filled 2022-03-10: qty 60, 30d supply, fill #3

## 2021-12-27 ENCOUNTER — Other Ambulatory Visit: Payer: Self-pay

## 2021-12-30 ENCOUNTER — Other Ambulatory Visit: Payer: Self-pay

## 2022-01-03 ENCOUNTER — Other Ambulatory Visit: Payer: Self-pay

## 2022-01-10 ENCOUNTER — Other Ambulatory Visit: Payer: Self-pay

## 2022-01-22 ENCOUNTER — Other Ambulatory Visit: Payer: Self-pay

## 2022-01-27 ENCOUNTER — Other Ambulatory Visit: Payer: Self-pay | Admitting: Family Medicine

## 2022-01-27 ENCOUNTER — Other Ambulatory Visit: Payer: Self-pay

## 2022-01-28 ENCOUNTER — Other Ambulatory Visit: Payer: Self-pay

## 2022-01-28 MED ORDER — TRULICITY 4.5 MG/0.5ML ~~LOC~~ SOAJ
4.5000 mg | SUBCUTANEOUS | 3 refills | Status: DC
  Filled 2022-01-28 – 2022-01-29 (×2): qty 2, 28d supply, fill #0

## 2022-01-28 NOTE — Telephone Encounter (Signed)
Requested Prescriptions  Pending Prescriptions Disp Refills   Dulaglutide (TRULICITY) 4.5 MG/0.5ML SOPN 2 mL 3    Sig: Inject 4.5 mg as directed once a week.     Endocrinology:  Diabetes - GLP-1 Receptor Agonists Passed - 01/27/2022  5:29 PM      Passed - HBA1C is between 0 and 7.9 and within 180 days    HbA1c, POC (controlled diabetic range)  Date Value Ref Range Status  10/08/2021 7.6 (A) 0.0 - 7.0 % Final         Passed - Valid encounter within last 6 months    Recent Outpatient Visits           3 months ago Type 2 diabetes mellitus with diabetic polyneuropathy, with long-term current use of insulin (HCC)   [place] [facility] [place], [place], MD   10 months ago Type 2 diabetes mellitus with diabetic polyneuropathy, with long-term current use of insulin (HCC)   [place] [facility] [place], [place], MD   11 months ago Type 2 diabetes mellitus with diabetic polyneuropathy, with long-term current use of insulin (HCC)   [place] [facility] [place], Jeannett Senior L, RPH-CPP   1 year ago Type 2 diabetes mellitus with diabetic polyneuropathy, with long-term current use of insulin [facility])    [facility] [place], Cornelius Moras, RPH-CPP   1 year ago Type 2 diabetes mellitus with diabetic polyneuropathy, with long-term current use of insulin [facility])    [facility] [place], Cornelius Moras, RPH-CPP       Future Appointments             In 2 months Hoy Register, MD [facility]

## 2022-01-29 ENCOUNTER — Other Ambulatory Visit: Payer: Self-pay

## 2022-01-30 ENCOUNTER — Other Ambulatory Visit: Payer: Self-pay

## 2022-01-31 ENCOUNTER — Other Ambulatory Visit: Payer: Self-pay

## 2022-02-06 ENCOUNTER — Other Ambulatory Visit: Payer: Self-pay | Admitting: Family Medicine

## 2022-02-06 DIAGNOSIS — E1142 Type 2 diabetes mellitus with diabetic polyneuropathy: Secondary | ICD-10-CM

## 2022-02-07 ENCOUNTER — Other Ambulatory Visit: Payer: Self-pay

## 2022-02-07 MED ORDER — "INSULIN SYRINGE-NEEDLE U-100 30G X 5/16"" 0.5 ML MISC"
1 refills | Status: AC
  Filled 2022-02-07: qty 100, 50d supply, fill #0

## 2022-02-07 MED ORDER — MELOXICAM 15 MG PO TABS
15.0000 mg | ORAL_TABLET | Freq: Every day | ORAL | 2 refills | Status: DC
  Filled 2022-02-07: qty 30, 30d supply, fill #0
  Filled 2022-03-20: qty 30, 30d supply, fill #1
  Filled 2022-04-17: qty 30, 30d supply, fill #2

## 2022-02-07 NOTE — Telephone Encounter (Signed)
Requested medications are due for refill today.  yes  Requested medications are on the active medications list.  yes  Last refill. Meloxicam 10/09/2021 #30 3 rf,  Syringes 08/01/2021 #100 1 rf  Future visit scheduled.   yes  Notes to clinic.  No protocol for syringes. Labs are expired for meloxicam.    Requested Prescriptions  Pending Prescriptions Disp Refills   meloxicam (MOBIC) 15 MG tablet 30 tablet 3    Sig: Take 1 tablet (15 mg total) by mouth daily.     Analgesics:  COX2 Inhibitors Failed - 02/06/2022  5:29 PM      Failed - Manual Review: Labs are only required if the patient has taken medication for more than 8 weeks.      Failed - HGB in normal range and within 360 days    Hemoglobin  Date Value Ref Range Status  12/03/2015 12.6 (L) 13.2 - 17.1 g/dL Final         Failed - HCT in normal range and within 360 days    HCT  Date Value Ref Range Status  12/03/2015 38.9 38.5 - 50.0 % Final         Passed - Cr in normal range and within 360 days    Creat  Date Value Ref Range Status  12/03/2015 0.85 0.70 - 1.33 mg/dL Final    Comment:      For patients > or = 59 years of age: The upper reference limit for Creatinine is approximately 13% higher for people identified as African-American.      Creatinine, Ser  Date Value Ref Range Status  10/15/2021 1.26 0.76 - 1.27 mg/dL Final   Creatinine, POC  Date Value Ref Range Status  08/14/2016 200 mg/dL Final         Passed - AST in normal range and within 360 days    AST  Date Value Ref Range Status  04/02/2021 15 0 - 40 IU/L Final         Passed - ALT in normal range and within 360 days    ALT  Date Value Ref Range Status  04/02/2021 12 0 - 44 IU/L Final         Passed - eGFR is 30 or above and within 360 days    GFR, Est African American  Date Value Ref Range Status  12/03/2015 >89 >=60 mL/min Final   GFR calc Af Amer  Date Value Ref Range Status  02/14/2020 94 >59 mL/min/1.73 Final    Comment:    **In  accordance with recommendations from the NKF-ASN Task force,**   [facility] is in the process of updating its eGFR calculation to the   2021 CKD-EPI creatinine equation that estimates kidney function   without a race variable.    GFR, Est Non African American  Date Value Ref Range Status  12/03/2015 >89 >=60 mL/min Final   GFR calc non Af Amer  Date Value Ref Range Status  02/14/2020 81 >59 mL/min/1.73 Final   eGFR  Date Value Ref Range Status  10/15/2021 66 >59 mL/min/1.73 Final         Passed - Patient is not pregnant      Passed - Valid encounter within last 12 months    Recent Outpatient Visits           4 months ago Type 2 diabetes mellitus with diabetic polyneuropathy, with long-term current use of insulin [facility])   [place], [place],  MD   10 months ago Type 2 diabetes mellitus with diabetic polyneuropathy, with long-term current use of insulin ([place])   [place], [place], MD   12 months ago Type 2 diabetes mellitus with diabetic polyneuropathy, with long-term current use of insulin [facility])   [place], Annie Main L, RPH-CPP   1 year ago Type 2 diabetes mellitus with diabetic polyneuropathy, with long-term current use of insulin [facility])   [place], Jarome Matin, RPH-CPP   1 year ago Type 2 diabetes mellitus with diabetic polyneuropathy, with long-term current use of insulin [facility])   [place], Stephen L, RPH-CPP       Future Appointments             In 2 months Charlott Rakes, MD [place]             Insulin Syringe-Needle U-100 (TRUEPLUS INSULIN SYRINGE) 30G X 5/16" 0.5 ML MISC 100 each 1    Sig: Use as directed to inject Levemir twice daily.     There is no refill protocol information for this order

## 2022-02-10 ENCOUNTER — Other Ambulatory Visit: Payer: Self-pay

## 2022-02-11 ENCOUNTER — Other Ambulatory Visit: Payer: Self-pay

## 2022-02-12 ENCOUNTER — Other Ambulatory Visit: Payer: Self-pay

## 2022-02-18 ENCOUNTER — Other Ambulatory Visit: Payer: Self-pay

## 2022-03-07 ENCOUNTER — Other Ambulatory Visit: Payer: Self-pay

## 2022-03-10 ENCOUNTER — Other Ambulatory Visit: Payer: Self-pay

## 2022-03-11 ENCOUNTER — Other Ambulatory Visit: Payer: Self-pay

## 2022-03-15 ENCOUNTER — Other Ambulatory Visit: Payer: Self-pay | Admitting: Family Medicine

## 2022-03-15 DIAGNOSIS — E1142 Type 2 diabetes mellitus with diabetic polyneuropathy: Secondary | ICD-10-CM

## 2022-03-17 ENCOUNTER — Other Ambulatory Visit: Payer: Self-pay

## 2022-03-17 ENCOUNTER — Other Ambulatory Visit: Payer: Self-pay | Admitting: Pharmacist

## 2022-03-17 MED ORDER — BASAGLAR KWIKPEN 100 UNIT/ML ~~LOC~~ SOPN
40.0000 [IU] | PEN_INJECTOR | Freq: Two times a day (BID) | SUBCUTANEOUS | 2 refills | Status: DC
  Filled 2022-03-17 – 2022-04-09 (×2): qty 24, 30d supply, fill #0
  Filled 2022-05-08: qty 24, 30d supply, fill #1
  Filled 2022-06-08 – 2022-06-09 (×2): qty 24, 30d supply, fill #2

## 2022-03-17 MED ORDER — INSULIN DETEMIR 100 UNIT/ML ~~LOC~~ SOLN
40.0000 [IU] | Freq: Two times a day (BID) | SUBCUTANEOUS | 2 refills | Status: DC
  Filled 2022-03-17: qty 20, 25d supply, fill #0
  Filled 2022-03-17: qty 30, 38d supply, fill #0
  Filled 2022-04-09: qty 20, 25d supply, fill #1

## 2022-03-17 NOTE — Telephone Encounter (Signed)
Requested Prescriptions  Pending Prescriptions Disp Refills   insulin detemir (LEVEMIR) 100 UNIT/ML injection 30 mL 2    Sig: Inject 0.4 mLs (40 Units total) into the skin 2 (two) times daily.     Endocrinology:  Diabetes - Insulins Passed - 03/15/2022  8:09 AM      Passed - HBA1C is between 0 and 7.9 and within 180 days    HbA1c, POC (controlled diabetic range)  Date Value Ref Range Status  10/08/2021 7.6 (A) 0.0 - 7.0 % Final         Passed - Valid encounter within last 6 months    Recent Outpatient Visits           5 months ago Type 2 diabetes mellitus with diabetic polyneuropathy, with long-term current use of insulin ([place])   [place], [employer], MD   11 months ago Type 2 diabetes mellitus with diabetic polyneuropathy, with long-term current use of insulin ([place])   [place], [place], MD   1 year ago Type 2 diabetes mellitus with diabetic polyneuropathy, with long-term current use of insulin ([place])   [place], Annie Main L, RPH-CPP   1 year ago Type 2 diabetes mellitus with diabetic polyneuropathy, with long-term current use of insulin [facility])   [place], Jarome Matin, RPH-CPP   1 year ago Type 2 diabetes mellitus with diabetic polyneuropathy, with long-term current use of insulin [facility])   [place], Jarome Matin, RPH-CPP       Future Appointments             In 1 month McClung, Dionne Bucy, PA-C [place]

## 2022-03-20 ENCOUNTER — Other Ambulatory Visit: Payer: Self-pay | Admitting: Family Medicine

## 2022-03-20 DIAGNOSIS — E1142 Type 2 diabetes mellitus with diabetic polyneuropathy: Secondary | ICD-10-CM

## 2022-03-21 ENCOUNTER — Other Ambulatory Visit: Payer: Self-pay

## 2022-03-21 MED ORDER — TRUE METRIX BLOOD GLUCOSE TEST VI STRP
ORAL_STRIP | 12 refills | Status: DC
  Filled 2022-03-21: qty 100, 33d supply, fill #0
  Filled 2022-07-14: qty 100, 33d supply, fill #1
  Filled 2022-10-01: qty 100, 33d supply, fill #2
  Filled 2022-12-10: qty 100, 33d supply, fill #3

## 2022-03-21 NOTE — Telephone Encounter (Signed)
Requested Prescriptions  Pending Prescriptions Disp Refills   glucose blood (TRUE METRIX BLOOD GLUCOSE TEST) test strip 100 strip 12    Sig: Use as instructed 3 times daily.     Endocrinology: Diabetes - Testing Supplies Passed - 03/20/2022  5:38 PM      Passed - Valid encounter within last 12 months    Recent Outpatient Visits           5 months ago Type 2 diabetes mellitus with diabetic polyneuropathy, with long-term current use of insulin ([place])   [place], [place], MD   11 months ago Type 2 diabetes mellitus with diabetic polyneuropathy, with long-term current use of insulin ([place])   [place], [place], MD   1 year ago Type 2 diabetes mellitus with diabetic polyneuropathy, with long-term current use of insulin ([place])   [place], Annie Main L, RPH-CPP   1 year ago Type 2 diabetes mellitus with diabetic polyneuropathy, with long-term current use of insulin [facility])   [place], Jarome Matin, RPH-CPP   1 year ago Type 2 diabetes mellitus with diabetic polyneuropathy, with long-term current use of insulin [facility])   [place], RPH-CPP       Future Appointments             In 3 weeks Thereasa Solo, Dionne Bucy, PA-C [place]

## 2022-04-04 ENCOUNTER — Other Ambulatory Visit: Payer: Self-pay

## 2022-04-09 ENCOUNTER — Other Ambulatory Visit: Payer: Self-pay

## 2022-04-10 ENCOUNTER — Other Ambulatory Visit: Payer: Self-pay

## 2022-04-11 ENCOUNTER — Other Ambulatory Visit: Payer: Self-pay | Admitting: Pharmacist

## 2022-04-11 ENCOUNTER — Other Ambulatory Visit: Payer: Self-pay

## 2022-04-11 DIAGNOSIS — E1142 Type 2 diabetes mellitus with diabetic polyneuropathy: Secondary | ICD-10-CM

## 2022-04-11 MED ORDER — TRUEPLUS PEN NEEDLES 32G X 4 MM MISC
6 refills | Status: DC
  Filled 2022-04-11: qty 100, 25d supply, fill #0
  Filled 2022-07-10: qty 100, 25d supply, fill #1
  Filled 2022-10-03: qty 100, 25d supply, fill #2
  Filled 2022-12-10: qty 100, 25d supply, fill #3
  Filled 2023-01-18: qty 100, 25d supply, fill #4

## 2022-04-14 ENCOUNTER — Ambulatory Visit: Payer: Self-pay | Admitting: Family Medicine

## 2022-04-16 ENCOUNTER — Ambulatory Visit: Payer: Self-pay | Admitting: Physician Assistant

## 2022-04-17 ENCOUNTER — Encounter: Payer: Self-pay | Admitting: Physician Assistant

## 2022-04-17 ENCOUNTER — Ambulatory Visit: Payer: Self-pay | Attending: Family Medicine | Admitting: Physician Assistant

## 2022-04-17 ENCOUNTER — Other Ambulatory Visit: Payer: Self-pay

## 2022-04-17 VITALS — BP 107/63 | HR 68 | Ht 75.0 in | Wt 361.4 lb

## 2022-04-17 DIAGNOSIS — I10 Essential (primary) hypertension: Secondary | ICD-10-CM

## 2022-04-17 DIAGNOSIS — I152 Hypertension secondary to endocrine disorders: Secondary | ICD-10-CM

## 2022-04-17 DIAGNOSIS — E785 Hyperlipidemia, unspecified: Secondary | ICD-10-CM

## 2022-04-17 DIAGNOSIS — N401 Enlarged prostate with lower urinary tract symptoms: Secondary | ICD-10-CM

## 2022-04-17 DIAGNOSIS — Z23 Encounter for immunization: Secondary | ICD-10-CM

## 2022-04-17 DIAGNOSIS — E7800 Pure hypercholesterolemia, unspecified: Secondary | ICD-10-CM

## 2022-04-17 DIAGNOSIS — E1169 Type 2 diabetes mellitus with other specified complication: Secondary | ICD-10-CM

## 2022-04-17 DIAGNOSIS — R351 Nocturia: Secondary | ICD-10-CM

## 2022-04-17 DIAGNOSIS — Z794 Long term (current) use of insulin: Secondary | ICD-10-CM

## 2022-04-17 DIAGNOSIS — E1159 Type 2 diabetes mellitus with other circulatory complications: Secondary | ICD-10-CM

## 2022-04-17 DIAGNOSIS — E1142 Type 2 diabetes mellitus with diabetic polyneuropathy: Secondary | ICD-10-CM

## 2022-04-17 LAB — POCT GLYCOSYLATED HEMOGLOBIN (HGB A1C): HbA1c, POC (controlled diabetic range): 7.8 % — AB (ref 0.0–7.0)

## 2022-04-17 LAB — GLUCOSE, POCT (MANUAL RESULT ENTRY): POC Glucose: 87 mg/dl (ref 70–99)

## 2022-04-17 MED ORDER — EZETIMIBE 10 MG PO TABS
10.0000 mg | ORAL_TABLET | Freq: Every day | ORAL | 1 refills | Status: DC
  Filled 2022-04-17: qty 90, 90d supply, fill #0
  Filled 2022-04-18: qty 30, 30d supply, fill #0
  Filled 2022-05-15: qty 30, 30d supply, fill #1
  Filled 2022-06-18: qty 30, 30d supply, fill #2
  Filled 2022-07-17: qty 30, 30d supply, fill #3
  Filled 2022-08-19: qty 30, 30d supply, fill #4
  Filled 2022-09-15: qty 30, 30d supply, fill #5

## 2022-04-17 MED ORDER — GABAPENTIN 300 MG PO CAPS
300.0000 mg | ORAL_CAPSULE | Freq: Three times a day (TID) | ORAL | 6 refills | Status: DC
  Filled 2022-05-07: qty 90, 30d supply, fill #0
  Filled 2022-06-07: qty 90, 30d supply, fill #1
  Filled 2022-07-03: qty 90, 30d supply, fill #2
  Filled 2022-08-02: qty 90, 30d supply, fill #3
  Filled 2022-09-04 – 2022-09-09 (×2): qty 90, 30d supply, fill #4
  Filled 2022-10-09: qty 90, 30d supply, fill #5
  Filled 2022-11-10: qty 90, 30d supply, fill #6

## 2022-04-17 MED ORDER — AMLODIPINE BESYLATE 5 MG PO TABS
5.0000 mg | ORAL_TABLET | Freq: Every day | ORAL | 1 refills | Status: DC
  Filled 2022-05-01: qty 30, 30d supply, fill #0
  Filled 2022-05-29: qty 30, 30d supply, fill #1
  Filled 2022-07-03: qty 30, 30d supply, fill #2
  Filled 2022-08-02: qty 30, 30d supply, fill #3
  Filled 2022-09-04 – 2022-09-09 (×2): qty 30, 30d supply, fill #4
  Filled 2022-10-01 – 2022-10-03 (×2): qty 30, 30d supply, fill #5

## 2022-04-17 MED ORDER — OMEGA-3-ACID ETHYL ESTERS 1 G PO CAPS
1.0000 | ORAL_CAPSULE | Freq: Two times a day (BID) | ORAL | 3 refills | Status: DC
  Filled 2022-04-17 – 2022-04-18 (×2): qty 60, 30d supply, fill #0
  Filled 2022-05-15: qty 60, 30d supply, fill #1
  Filled 2022-06-18: qty 60, 30d supply, fill #2
  Filled 2022-07-17: qty 60, 30d supply, fill #3

## 2022-04-17 MED ORDER — TAMSULOSIN HCL 0.4 MG PO CAPS
0.4000 mg | ORAL_CAPSULE | Freq: Every day | ORAL | 1 refills | Status: DC
  Filled 2022-05-01: qty 30, 30d supply, fill #0
  Filled 2022-05-29: qty 30, 30d supply, fill #1
  Filled 2022-06-04 – 2022-07-10 (×2): qty 30, 30d supply, fill #2
  Filled 2022-08-08: qty 30, 30d supply, fill #3
  Filled 2022-09-15: qty 30, 30d supply, fill #4
  Filled 2022-10-09: qty 30, 30d supply, fill #5

## 2022-04-17 MED ORDER — FINASTERIDE 5 MG PO TABS
5.0000 mg | ORAL_TABLET | Freq: Every day | ORAL | 1 refills | Status: DC
  Filled 2022-04-17: qty 90, 90d supply, fill #0
  Filled 2022-04-18: qty 30, 30d supply, fill #0
  Filled 2022-05-15: qty 30, 30d supply, fill #1
  Filled 2022-06-12: qty 30, 30d supply, fill #2
  Filled 2022-09-15: qty 30, 30d supply, fill #3
  Filled 2022-10-10 (×2): qty 30, 30d supply, fill #4
  Filled 2022-11-16 – 2022-11-17 (×2): qty 30, 30d supply, fill #5

## 2022-04-17 MED ORDER — METFORMIN HCL 1000 MG PO TABS: 1000.0000 mg | ORAL_TABLET | Freq: Two times a day (BID) | ORAL | 1 refills | Status: DC

## 2022-04-17 NOTE — Progress Notes (Signed)
Patient ID: Dillon Mcgee, male   DOB: 1960-08-16, 62 y.o.   MRN: [id number]   Dillon Mcgee, is a 62 y.o. male  [id number]  [id number]  DOB - 18-Mar-1960  Chief Complaint  Patient presents with   Diabetes       Subjective:   Dillon Mcgee is a 62 y.o. male here today for med RF.  Blood sugars running in general from 99-160.  1 episode of hypoglycemia=48 and he felt shaky and ate some ice cream and that brought his sugar up.  He is feeling fine.  Denies CP/SOB/dizziness.  Insurance just changed levemir to basaglar at same dosing.  He can't afford to be charged for meds today and only needs RF on some meds.    No problems updated.  ALLERGIES: Allergies  Allergen Reactions   Lisinopril Other (See Comments)    Hyperkalemia    Pioglitazone Swelling    Lower extremity swelling.    PAST MEDICAL HISTORY: Past Medical History:  Diagnosis Date   Diabetes mellitus without complication ([place])    Hyperlipidemia    Hypertension     MEDICATIONS AT HOME: Prior to Admission medications   Medication Sig Start Date End Date Taking? Authorizing Provider  aspirin EC 81 MG tablet Take 1 tablet (81 mg total) daily by mouth. 01/19/17  Yes Hairston, Mandesia R, FNP  Blood Glucose Monitoring Suppl (TRUE METRIX GO GLUCOSE METER) w/Device KIT 1 each by Does not apply route every 8 (eight) hours as needed. 06/10/16  Yes Maren Reamer, MD  calcipotriene-betamethasone (TACLONEX) ointment Apply ointment to affected area(s) daily. 11/27/20  Yes Newlin, Charlane Ferretti, MD  Dulaglutide (TRULICITY) 4.5 0000000 SOPN Inject 4.5 mg as directed once a week. 01/28/22  Yes Charlott Rakes, MD  empagliflozin (JARDIANCE) 25 MG TABS tablet Take 1 tablet (25 mg total) by mouth daily before breakfast. 10/14/21  Yes Newlin, [name], MD  ferrous sulfate 325 (65 FE) MG tablet Take 325 mg by mouth daily with breakfast.   Yes [provider]  glipiZIDE (GLUCOTROL) 10 MG tablet TAKE 1 TABLET (10 MG TOTAL) BY  MOUTH in the morning and 0.5 tablet (93m total) by mouth in the evening. 10/08/21  Yes Newlin, ECharlane Ferretti MD  glucose blood (TRUE METRIX BLOOD GLUCOSE TEST) test strip Use as instructed 3 times daily. 03/21/22  Yes NCharlott Rakes MD  Insulin Glargine (BASAGLAR KWIKPEN) 100 UNIT/ML Inject 40 Units into the skin 2 (two) times daily. 03/17/22  Yes [place]lin, ECharlane Ferretti MD  Insulin Pen Needle (TRUEPLUS PEN NEEDLES) 32G X 4 MM MISC use as directed to inject insulin twice daily. 04/11/22  Yes NCharlott Rakes MD  Insulin Syringe-Needle U-100 (TRUEPLUS INSULIN SYRINGE) 30G X 5/16" 0.5 ML MISC Use as directed to inject Levemir twice daily. 02/07/22  Yes NCharlott Rakes MD  meloxicam (MOBIC) 15 MG tablet Take 1 tablet (15 mg total) by mouth daily. 02/07/22  Yes NCharlott Rakes MD  TRUEPLUS LANCETS 26G MISC 1 each by Does not apply route every 8 (eight) hours as needed. 06/10/16  Yes Langeland, Dawn T, MD  amLODipine (NORVASC) 5 MG tablet TAKE 1 TABLET (5 MG TOTAL) BY MOUTH DAILY. 04/17/22 04/17/23  MArgentina Donovan PA-C  ezetimibe (ZETIA) 10 MG tablet TAKE 1 TABLET (10 MG TOTAL) BY MOUTH DAILY. 04/17/22 04/17/23  MArgentina Donovan PA-C  finasteride (PROSCAR) 5 MG tablet Take 1 tablet (5 mg total) by mouth daily. 04/17/22   MArgentina Donovan PA-C  gabapentin (NEURONTIN) 300 MG capsule TAKE 1 CAPSULE (300 MG TOTAL)  BY MOUTH 3 (THREE) TIMES DAILY. 04/17/22 04/17/23  Argentina Donovan, PA-C  metFORMIN (GLUCOPHAGE) 1000 MG tablet TAKE 1 TABLET (1,000 MG TOTAL) BY MOUTH 2 (TWO) TIMES DAILY WITH A MEAL. 04/17/22   Argentina Donovan, PA-C  omega-3 acid ethyl esters (LOVAZA) 1 g capsule TAKE 1 CAPSULE BY MOUTH 2 TIMES DAILY. 04/17/22   Argentina Donovan, PA-C  tamsulosin (FLOMAX) 0.4 MG CAPS capsule TAKE 1 CAPSULE (0.4 MG TOTAL) BY MOUTH DAILY. 04/17/22 04/17/23  Argentina Donovan, PA-C    ROS: Neg HEENT Neg resp Neg cardiac Neg GI Neg GU Neg MS Neg psych Neg neuro  Objective:   Vitals:   04/17/22 0850  BP: 107/63  Pulse:  68  SpO2: 98%  Weight: (!) 361 lb 6.4 oz (163.9 kg)  Height: 6' 3"$  (1.905 m)   Exam General appearance : Awake, alert, not in any distress. Speech Clear. Not toxic looking; obese HEENT: Atraumatic and Normocephalic Neck: Supple, no JVD. No cervical lymphadenopathy.  Chest: Good air entry bilaterally, CTAB.  No rales/rhonchi/wheezing CVS: S1 S2 regular, no murmurs.  Extremities: B/L Lower Ext shows no edema, both legs are warm to touch Neurology: Awake alert, and oriented X 3, CN II-XII intact, Non focal Skin: No Rash  Data Review Lab Results  Component Value Date   HGBA1C 7.8 (A) 04/17/2022   HGBA1C 7.6 (A) 10/08/2021   HGBA1C 8.4 (A) 04/02/2021    Assessment & Plan   1. Type 2 diabetes mellitus with diabetic polyneuropathy, with long-term current use of insulin ([place]) Work on diabetic diet.  Basaglar and trulicity at same doses.  He will call when he needs RF - Glucose (CBG) - HgB A1c - Comprehensive metabolic panel - metFORMIN (GLUCOPHAGE) 1000 MG tablet; TAKE 1 TABLET (1,000 MG TOTAL) BY MOUTH 2 (TWO) TIMES DAILY WITH A MEAL.  Dispense: 180 tablet; Refill: 1 - gabapentin (NEURONTIN) 300 MG capsule; TAKE 1 CAPSULE (300 MG TOTAL) BY MOUTH 3 (THREE) TIMES DAILY.  Dispense: 90 capsule; Refill: 6  2. Pure hypercholesterolemia On omega 3 - Lipid panel  3. Essential hypertension Controlled-continue amlodipine - Comprehensive metabolic panel  4. Benign prostatic hyperplasia with nocturia - tamsulosin (FLOMAX) 0.4 MG CAPS capsule; TAKE 1 CAPSULE (0.4 MG TOTAL) BY MOUTH DAILY.  Dispense: 90 capsule; Refill: 1 - finasteride (PROSCAR) 5 MG tablet; Take 1 tablet (5 mg total) by mouth daily.  Dispense: 90 tablet; Refill: 1  5. Hyperlipidemia associated with type 2 diabetes mellitus (HCC) - ezetimibe (ZETIA) 10 MG tablet; TAKE 1 TABLET (10 MG TOTAL) BY MOUTH DAILY.  Dispense: 90 tablet; Refill: 1 - omega-3 acid ethyl esters (LOVAZA) 1 g capsule; TAKE 1 CAPSULE BY MOUTH 2 TIMES  DAILY.  Dispense: 60 capsule; Refill: 3  6. Hypertension associated with type 2 diabetes mellitus (HCC) - amLODipine (NORVASC) 5 MG tablet; TAKE 1 TABLET (5 MG TOTAL) BY MOUTH DAILY.  Dispense: 90 tablet; Refill: 1  Please RF meds for 3 months if he needs his basaglar/trulicity or others not filled today  Return in about 3 months (around 07/16/2022) for Dr Margarita Rana for chronic conditions.  The patient was given clear instructions to go to ER or return to medical center if symptoms don't improve, worsen or new problems develop. The patient verbalized understanding. The patient was told to call to get lab results if they haven't heard anything in the next week.      Freeman Caldron, PA-C [facility] [place], [place]  04/17/2022, 9:08 AM

## 2022-04-17 NOTE — Patient Instructions (Signed)
Work on eliminating sugars and starchy foods from your diet

## 2022-04-18 ENCOUNTER — Other Ambulatory Visit: Payer: Self-pay

## 2022-04-18 LAB — LIPID PANEL
Chol/HDL Ratio: 4.4 ratio (ref 0.0–5.0)
Cholesterol, Total: 201 mg/dL — ABNORMAL HIGH (ref 100–199)
HDL: 46 mg/dL (ref >39)
LDL Chol Calc (NIH): 127 mg/dL — ABNORMAL HIGH (ref 0–99)
Triglycerides: 160 mg/dL — ABNORMAL HIGH (ref 0–149)
VLDL Cholesterol Cal: 28 mg/dL (ref 5–40)

## 2022-04-18 LAB — COMPREHENSIVE METABOLIC PANEL
ALT: 24 IU/L (ref 0–44)
AST: 20 IU/L (ref 0–40)
Albumin/Globulin Ratio: 1.5 (ref 1.2–2.2)
Albumin: 4 g/dL (ref 3.8–4.9)
Alkaline Phosphatase: 86 IU/L (ref 44–121)
BUN/Creatinine Ratio: 15 (ref 9–20)
BUN: 16 mg/dL (ref 6–24)
Bilirubin Total: 0.3 mg/dL (ref 0.0–1.2)
CO2: 25 mmol/L (ref 20–29)
Calcium: 9.4 mg/dL (ref 8.7–10.2)
Chloride: 106 mmol/L (ref 96–106)
Creatinine, Ser: 1.05 mg/dL (ref 0.76–1.27)
Globulin, Total: 2.7 g/dL (ref 1.5–4.5)
Glucose: 60 mg/dL — ABNORMAL LOW (ref 70–99)
Potassium: 4.8 mmol/L (ref 3.5–5.2)
Sodium: 146 mmol/L — ABNORMAL HIGH (ref 134–144)
Total Protein: 6.7 g/dL (ref 6.0–8.5)
eGFR: 82 mL/min/{1.73_m2} (ref >59)

## 2022-05-02 ENCOUNTER — Other Ambulatory Visit: Payer: Self-pay

## 2022-05-07 ENCOUNTER — Other Ambulatory Visit: Payer: Self-pay

## 2022-05-08 ENCOUNTER — Other Ambulatory Visit: Payer: Self-pay

## 2022-05-09 ENCOUNTER — Other Ambulatory Visit: Payer: Self-pay

## 2022-05-15 ENCOUNTER — Other Ambulatory Visit: Payer: Self-pay | Admitting: Family Medicine

## 2022-05-15 DIAGNOSIS — Z794 Long term (current) use of insulin: Secondary | ICD-10-CM

## 2022-05-16 ENCOUNTER — Other Ambulatory Visit: Payer: Self-pay

## 2022-05-16 MED ORDER — GLIPIZIDE 10 MG PO TABS
ORAL_TABLET | ORAL | 6 refills | Status: DC
  Filled 2022-05-16: qty 45, 30d supply, fill #0
  Filled 2022-06-12: qty 45, 30d supply, fill #1
  Filled 2022-07-17 – 2022-07-18 (×2): qty 45, 30d supply, fill #2
  Filled 2022-08-15 – 2022-08-18 (×2): qty 45, 30d supply, fill #3
  Filled 2022-09-15: qty 45, 30d supply, fill #4
  Filled 2022-10-10 (×2): qty 45, 30d supply, fill #5
  Filled 2022-11-16: qty 45, 30d supply, fill #6

## 2022-05-16 MED ORDER — MELOXICAM 15 MG PO TABS
15.0000 mg | ORAL_TABLET | Freq: Every day | ORAL | 2 refills | Status: DC
  Filled 2022-05-16: qty 30, 30d supply, fill #0
  Filled 2022-06-18: qty 30, 30d supply, fill #1
  Filled 2022-07-10: qty 30, 30d supply, fill #2

## 2022-05-19 ENCOUNTER — Other Ambulatory Visit: Payer: Self-pay

## 2022-05-22 ENCOUNTER — Other Ambulatory Visit: Payer: Self-pay

## 2022-05-29 ENCOUNTER — Other Ambulatory Visit: Payer: Self-pay

## 2022-05-30 ENCOUNTER — Other Ambulatory Visit: Payer: Self-pay

## 2022-06-03 ENCOUNTER — Other Ambulatory Visit: Payer: Self-pay

## 2022-06-04 ENCOUNTER — Other Ambulatory Visit: Payer: Self-pay

## 2022-06-05 ENCOUNTER — Other Ambulatory Visit: Payer: Self-pay

## 2022-06-09 ENCOUNTER — Other Ambulatory Visit: Payer: Self-pay

## 2022-06-10 ENCOUNTER — Other Ambulatory Visit: Payer: Self-pay

## 2022-06-12 ENCOUNTER — Other Ambulatory Visit: Payer: Self-pay

## 2022-06-13 ENCOUNTER — Other Ambulatory Visit: Payer: Self-pay

## 2022-06-18 ENCOUNTER — Other Ambulatory Visit: Payer: Self-pay

## 2022-06-19 ENCOUNTER — Other Ambulatory Visit: Payer: Self-pay

## 2022-07-03 ENCOUNTER — Other Ambulatory Visit: Payer: Self-pay

## 2022-07-09 ENCOUNTER — Other Ambulatory Visit: Payer: Self-pay | Admitting: Family Medicine

## 2022-07-09 ENCOUNTER — Other Ambulatory Visit: Payer: Self-pay

## 2022-07-09 MED ORDER — BASAGLAR KWIKPEN 100 UNIT/ML ~~LOC~~ SOPN
40.0000 [IU] | PEN_INJECTOR | Freq: Two times a day (BID) | SUBCUTANEOUS | 2 refills | Status: DC
  Filled 2022-07-09 – 2022-07-10 (×2): qty 30, 38d supply, fill #0
  Filled 2022-08-19 – 2022-08-20 (×2): qty 30, 38d supply, fill #1
  Filled 2022-10-01: qty 30, 38d supply, fill #2

## 2022-07-09 NOTE — Telephone Encounter (Signed)
Requested Prescriptions  Pending Prescriptions Disp Refills   Insulin Glargine (BASAGLAR KWIKPEN) 100 UNIT/ML 30 mL 2    Sig: Inject 40 Units into the skin 2 (two) times daily.     Endocrinology:  Diabetes - Insulins Passed - 07/09/2022  5:24 PM      Passed - HBA1C is between 0 and 7.9 and within 180 days    HbA1c, POC (controlled diabetic range)  Date Value Ref Range Status  04/17/2022 7.8 (A) 0.0 - 7.0 % Final         Passed - Valid encounter within last 6 months    Recent Outpatient Visits           2 months ago Type 2 diabetes mellitus with diabetic polyneuropathy, with long-term current use of insulin [facility])   [facility] [place], [place], [place]   9 months ago Type 2 diabetes mellitus with diabetic polyneuropathy, with long-term current use of insulin (HCC)   [place] [facility] [place], [place], MD   1 year ago Type 2 diabetes mellitus with diabetic polyneuropathy, with long-term current use of insulin (HCC)   [place] [facility] [place], [place], MD   1 year ago Type 2 diabetes mellitus with diabetic polyneuropathy, with long-term current use of insulin [facility])   [place] [facility] [place], [place] L, RPH-CPP   1 year ago Type 2 diabetes mellitus with diabetic polyneuropathy, with long-term current use of insulin [facility])    [facility] [place], Cornelius Moras, RPH-CPP       Future Appointments             In 1 week Hoy Register, MD [facility]

## 2022-07-10 ENCOUNTER — Other Ambulatory Visit: Payer: Self-pay

## 2022-07-11 ENCOUNTER — Other Ambulatory Visit: Payer: Self-pay

## 2022-07-14 ENCOUNTER — Other Ambulatory Visit: Payer: Self-pay

## 2022-07-14 ENCOUNTER — Other Ambulatory Visit (HOSPITAL_COMMUNITY): Payer: Self-pay

## 2022-07-16 ENCOUNTER — Encounter: Payer: Self-pay | Admitting: Family Medicine

## 2022-07-16 ENCOUNTER — Ambulatory Visit: Payer: Self-pay | Attending: Family Medicine | Admitting: Family Medicine

## 2022-07-16 VITALS — BP 134/76 | HR 63 | Ht 75.0 in | Wt 376.4 lb

## 2022-07-16 DIAGNOSIS — I152 Hypertension secondary to endocrine disorders: Secondary | ICD-10-CM

## 2022-07-16 DIAGNOSIS — E1142 Type 2 diabetes mellitus with diabetic polyneuropathy: Secondary | ICD-10-CM

## 2022-07-16 DIAGNOSIS — E1169 Type 2 diabetes mellitus with other specified complication: Secondary | ICD-10-CM

## 2022-07-16 DIAGNOSIS — T466X5A Adverse effect of antihyperlipidemic and antiarteriosclerotic drugs, initial encounter: Secondary | ICD-10-CM

## 2022-07-16 DIAGNOSIS — G720 Drug-induced myopathy: Secondary | ICD-10-CM

## 2022-07-16 DIAGNOSIS — Z7984 Long term (current) use of oral hypoglycemic drugs: Secondary | ICD-10-CM

## 2022-07-16 DIAGNOSIS — E1159 Type 2 diabetes mellitus with other circulatory complications: Secondary | ICD-10-CM

## 2022-07-16 DIAGNOSIS — E785 Hyperlipidemia, unspecified: Secondary | ICD-10-CM

## 2022-07-16 DIAGNOSIS — Z794 Long term (current) use of insulin: Secondary | ICD-10-CM

## 2022-07-16 LAB — POCT GLYCOSYLATED HEMOGLOBIN (HGB A1C): HbA1c, POC (controlled diabetic range): 7.4 % — AB (ref 0.0–7.0)

## 2022-07-16 NOTE — Progress Notes (Signed)
Subjective:  Patient ID: Dillon Mcgee, male    DOB: 1960-12-29  Age: 62 y.o. MRN: [id number]  CC: Diabetes   HPI Dillon Mcgee is a 62 y.o. year old male with a history of hypertension, type 2 diabetes mellitus (A1c 7.4), hyperlipidemia, statin intolerance, obesity here for follow-up visit.    Interval History:  He has had hypoglycemia up to 43,48,47, 61  on some mornings on a few occasions in 04/2022 and march 2024 but in the last 2 months no hypoglycemia.  Endorses eating dinner the night before.  Denies presence of visual concerns.  Neuropathy is controlled on gabapentin. He discontinued metformin and discovered the swelling in his feet resolved and his thigh cramps are not as bad; he would like to discontinue metformin. Endorses adherence with his insulin and Jardiance. His job is very physical as he works with [employer] and is always on the move.  Does not exercise outside of work.  Endorses adherence with his antihypertensive.  Unable to take a statin due to intolerance but he is on Zetia.  For BPH he remains on tamsulosin and finasteride. Past Medical History:  Diagnosis Date   Diabetes mellitus without complication (HCC)    Hyperlipidemia    Hypertension     Past Surgical History:  Procedure Laterality Date   COLONOSCOPY     5 years ago     Family History  Problem Relation Age of Onset   Heart disease Maternal Aunt    Diabetes Maternal Uncle     Social History   Socioeconomic History   Marital status: Single    Spouse name: Not on file   Number of children: Not on file   Years of education: Not on file   Highest education level: Not on file  Occupational History   Not on file  Tobacco Use   Smoking status: Never   Smokeless tobacco: Never  Vaping Use   Vaping Use: Never used  Substance and Sexual Activity   Alcohol use: No    Alcohol/week: 0.0 standard drinks of alcohol   Drug use: Not on file   Sexual activity: Not on file  Other Topics  Concern   Not on file  Social History Narrative   Not on file   Social Determinants of Health   Financial Resource Strain: Not on file  Food Insecurity: Not on file  Transportation Needs: Not on file  Physical Activity: Not on file  Stress: Not on file  Social Connections: Not on file    Allergies  Allergen Reactions   Lisinopril Other (See Comments)    Hyperkalemia    Pioglitazone Swelling    Lower extremity swelling.    Outpatient Medications Prior to Visit  Medication Sig Dispense Refill   amLODipine (NORVASC) 5 MG tablet Take 1 tablet (5 mg total) by mouth daily. 90 tablet 1   aspirin EC 81 MG tablet Take 1 tablet (81 mg total) daily by mouth. 90 tablet 3   Blood Glucose Monitoring Suppl (TRUE METRIX GO GLUCOSE METER) w/Device KIT 1 each by Does not apply route every 8 (eight) hours as needed. 1 kit 0   calcipotriene-betamethasone (TACLONEX) ointment Apply ointment to affected area(s) daily. 100 g 1   Dulaglutide (TRULICITY) 4.5 MG/0.5ML SOPN Inject 4.5 mg as directed once a week. 2 mL 3   empagliflozin (JARDIANCE) 25 MG TABS tablet Take 1 tablet (25 mg total) by mouth daily before breakfast. 90 tablet 1   ezetimibe (ZETIA) 10 MG tablet  Take 1 tablet (10 mg total) by mouth daily. 90 tablet 1   ferrous sulfate 325 (65 FE) MG tablet Take 325 mg by mouth daily with breakfast.     finasteride (PROSCAR) 5 MG tablet Take 1 tablet (5 mg total) by mouth daily. 90 tablet 1   gabapentin (NEURONTIN) 300 MG capsule Take 1 capsule (300 mg total) by mouth 3 (three) times daily. 90 capsule 6   glipiZIDE (GLUCOTROL) 10 MG tablet TAKE 1 TABLET (10 MG TOTAL) BY MOUTH in the morning and 0.5 tablet (5mg  total) by mouth in the evening. 45 tablet 6   glucose blood (TRUE METRIX BLOOD GLUCOSE TEST) test strip Use as instructed 3 times daily. 100 strip 12   Insulin Glargine (BASAGLAR KWIKPEN) 100 UNIT/ML Inject 40 Units into the skin 2 (two) times daily. 30 mL 2   Insulin Pen Needle (TRUEPLUS PEN  NEEDLES) 32G X 4 MM MISC use as directed to inject insulin twice daily. 100 each 6   Insulin Syringe-Needle U-100 (TRUEPLUS INSULIN SYRINGE) 30G X 5/16" 0.5 ML MISC Use as directed to inject Levemir twice daily. 100 each 1   meloxicam (MOBIC) 15 MG tablet Take 1 tablet (15 mg total) by mouth daily. 30 tablet 2   omega-3 acid ethyl esters (LOVAZA) 1 g capsule TAKE 1 CAPSULE BY MOUTH 2 TIMES DAILY. 60 capsule 3   tamsulosin (FLOMAX) 0.4 MG CAPS capsule Take 1 capsule (0.4 mg total) by mouth daily. 90 capsule 1   TRUEPLUS LANCETS 26G MISC 1 each by Does not apply route every 8 (eight) hours as needed. 100 each 12   metFORMIN (GLUCOPHAGE) 1000 MG tablet Take 1 tablet (1,000 mg total) by mouth 2 (two) times daily with a meal. 180 tablet 1   No facility-administered medications prior to visit.     ROS Review of Systems  Constitutional:  Negative for activity change and appetite change.  HENT:  Negative for sinus pressure and sore throat.   Respiratory:  Negative for chest tightness, shortness of breath and wheezing.   Cardiovascular:  Negative for chest pain and palpitations.  Gastrointestinal:  Negative for abdominal distention, abdominal pain and constipation.  Genitourinary: Negative.   Musculoskeletal: Negative.   Psychiatric/Behavioral:  Negative for behavioral problems and dysphoric mood.     Objective:  BP 134/76   Pulse 63   Ht 6\' 3"  (1.905 m)   Wt (!) 376 lb 6.4 oz (170.7 kg)   SpO2 95%   BMI 47.05 kg/m      07/16/2022    9:11 AM 04/17/2022    8:50 AM 10/08/2021    2:00 PM  BP/Weight  Systolic BP 134 107 118  Diastolic BP 76 63 73  Wt. (Lbs) 376.4 361.4 357.8  BMI 47.05 kg/m2 45.17 kg/m2 44.72 kg/m2      Physical Exam Constitutional:      Appearance: He is well-developed.  Cardiovascular:     Rate and Rhythm: Normal rate.     Heart sounds: Normal heart sounds. No murmur heard. Pulmonary:     Effort: Pulmonary effort is normal.     Breath sounds: Normal breath  sounds. No wheezing or rales.  Chest:     Chest wall: No tenderness.  Abdominal:     General: Bowel sounds are normal. There is no distension.     Palpations: Abdomen is soft. There is no mass.     Tenderness: There is no abdominal tenderness.  Musculoskeletal:        General: Normal  range of motion.     Right lower leg: No edema.     Left lower leg: No edema.  Neurological:     Mental Status: He is alert and oriented to person, place, and time.  Psychiatric:        Mood and Affect: Mood normal.    Diabetic Foot Exam - Simple   Simple Foot Form Diabetic Foot exam was performed with the following findings: Yes 07/16/2022  9:31 AM  Visual Inspection No deformities, no ulcerations, no other skin breakdown bilaterally: Yes Sensation Testing Intact to touch and monofilament testing bilaterally: Yes Pulse Check Posterior Tibialis and Dorsalis pulse intact bilaterally: Yes Comments        Latest Ref Rng & Units 04/17/2022    9:15 AM 10/15/2021    8:34 AM 04/02/2021    9:29 AM  CMP  Glucose 70 - 99 mg/dL 60  914  782   BUN 6 - 24 mg/dL 16  21  14    Creatinine 0.76 - 1.27 mg/dL 9.56  2.13  0.86   Sodium 134 - 144 mmol/L 146  145  147   Potassium 3.5 - 5.2 mmol/L 4.8  5.0  5.0   Chloride 96 - 106 mmol/L 106  106  105   CO2 20 - 29 mmol/L 25  24  26    Calcium 8.7 - 10.2 mg/dL 9.4  9.8  57.8   Total Protein 6.0 - 8.5 g/dL 6.7   7.3   Total Bilirubin 0.0 - 1.2 mg/dL 0.3   0.4   Alkaline Phos 44 - 121 IU/L 86   94   AST 0 - 40 IU/L 20   15   ALT 0 - 44 IU/L 24   12     Lipid Panel     Component Value Date/Time   CHOL 201 (H) 04/17/2022 0915   TRIG 160 (H) 04/17/2022 0915   HDL 46 04/17/2022 0915   CHOLHDL 4.4 04/17/2022 0915   CHOLHDL 3.5 06/07/2015 1005   VLDL 33 (H) 06/07/2015 1005   LDLCALC 127 (H) 04/17/2022 0915    CBC    Component Value Date/Time   WBC 7.4 12/03/2015 1704   RBC 4.17 (L) 12/03/2015 1704   HGB 12.6 (L) 12/03/2015 1704   HCT 38.9 12/03/2015 1704    PLT 288 12/03/2015 1704   MCV 93.3 12/03/2015 1704   MCH 30.2 12/03/2015 1704   MCHC 32.4 12/03/2015 1704   RDW 13.1 12/03/2015 1704   LYMPHSABS 2,294 12/03/2015 1704   MONOABS 518 12/03/2015 1704   EOSABS 148 12/03/2015 1704   BASOSABS 74 12/03/2015 1704    Lab Results  Component Value Date   HGBA1C 7.4 (A) 07/16/2022    Assessment & Plan:  1. Type 2 diabetes mellitus with diabetic polyneuropathy, with long-term current use of insulin (HCC) Controlled with A1c of 7.4, goal is less than 7.0 He has not had any recent hypoglycemia as last values were about 3 months ago. Advised that if hypoglycemia occurs he can reduce his evening dose of Basaglar from 40 units to 38 units Continue with Trulicity, Basaglar, glipizide Discontinue metformin as per patient request Counseled on Diabetic diet, my plate method, 469 minutes of moderate intensity exercise/week Blood sugar logs with fasting goals of 80-120 mg/dl, random of less than 629 and in the event of sugars less than 60 mg/dl or greater than 528 mg/dl encouraged to notify the clinic. Advised on the need for annual eye exams, annual foot exams, Pneumonia  vaccine. - POCT glycosylated hemoglobin (Hb A1C)  2. 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 Hypertension associated with type 2 diabetes mellitus (HCC) Controlled Continue amlodipine Counseled on blood pressure goal of less than 130/80, low-sodium, DASH diet, medication compliance, 150 minutes of moderate intensity exercise per week. Discussed medication compliance, adverse effects.  4. Statin myopathy See #2 above    No orders of the defined types were placed in this encounter.   Follow-up: Return in about 3 months (around 10/16/2022) for Chronic medical conditions.        Hoy Register, MD, FAAFP. [facility] [place], [place] [phone]   07/16/2022, 11:38 AM

## 2022-07-16 NOTE — Patient Instructions (Signed)

## 2022-07-18 ENCOUNTER — Other Ambulatory Visit: Payer: Self-pay

## 2022-08-04 ENCOUNTER — Other Ambulatory Visit: Payer: Self-pay

## 2022-08-08 ENCOUNTER — Other Ambulatory Visit: Payer: Self-pay | Admitting: Family Medicine

## 2022-08-08 NOTE — Telephone Encounter (Signed)
Requested medications are due for refill today.  yes  Requested medications are on the active medications list.  yes  Last refill. 05/16/2022 #30 2 rf  Future visit scheduled.   yes  Notes to clinic.  Labs are expired.    Requested Prescriptions  Pending Prescriptions Disp Refills   meloxicam (MOBIC) 15 MG tablet 30 tablet 2    Sig: Take 1 tablet (15 mg total) by mouth daily.     Analgesics:  COX2 Inhibitors Failed - 08/08/2022  5:45 PM      Failed - Manual Review: Labs are only required if the patient has taken medication for more than 8 weeks.      Failed - HGB in normal range and within 360 days    Hemoglobin  Date Value Ref Range Status  12/03/2015 12.6 (L) 13.2 - 17.1 g/dL Final         Failed - HCT in normal range and within 360 days    HCT  Date Value Ref Range Status  12/03/2015 38.9 38.5 - 50.0 % Final         Passed - Cr in normal range and within 360 days    Creat  Date Value Ref Range Status  12/03/2015 0.85 0.70 - 1.33 mg/dL Final    Comment:      For patients > or = 60 years of age: The upper reference limit for Creatinine is approximately 13% higher for people identified as African-American.      Creatinine, Ser  Date Value Ref Range Status  04/17/2022 1.05 0.76 - 1.27 mg/dL Final   Creatinine, POC  Date Value Ref Range Status  08/14/2016 200 mg/dL Final         Passed - AST in normal range and within 360 days    AST  Date Value Ref Range Status  04/17/2022 20 0 - 40 IU/L Final         Passed - ALT in normal range and within 360 days    ALT  Date Value Ref Range Status  04/17/2022 24 0 - 44 IU/L Final         Passed - eGFR is 30 or above and within 360 days    GFR, Est African American  Date Value Ref Range Status  12/03/2015 >89 >=60 mL/min Final   GFR calc Af Amer  Date Value Ref Range Status  02/14/2020 94 >59 mL/min/1.73 Final    Comment:    **In accordance with recommendations from the NKF-ASN Task force,**   [facility] is in  the process of updating its eGFR calculation to the   2021 CKD-EPI creatinine equation that estimates kidney function   without a race variable.    GFR, Est Non African American  Date Value Ref Range Status  12/03/2015 >89 >=60 mL/min Final   GFR calc non Af Amer  Date Value Ref Range Status  02/14/2020 81 >59 mL/min/1.73 Final   eGFR  Date Value Ref Range Status  04/17/2022 82 >59 mL/min/1.73 Final         Passed - Patient is not pregnant      Passed - Valid encounter within last 12 months    Recent Outpatient Visits           3 weeks ago Type 2 diabetes mellitus with diabetic polyneuropathy, with long-term current use of insulin (HCC)    [place] [facility] [place], Odette Horns, MD   3 months ago Type 2 diabetes mellitus with diabetic  polyneuropathy, with long-term current use of insulin [facility])   [place] [facility] [place], [place], [place]   10 months ago Type 2 diabetes mellitus with diabetic polyneuropathy, with long-term current use of insulin (HCC)   [place] [facility] [place], Odette Horns, MD   1 year ago Type 2 diabetes mellitus with diabetic polyneuropathy, with long-term current use of insulin (HCC)   [facility] [place], Odette Horns, MD   1 year ago Type 2 diabetes mellitus with diabetic polyneuropathy, with long-term current use of insulin [facility])   [place] [facility] [place], Cornelius Moras, RPH-CPP       Future Appointments             In 2 months Hoy Register, MD [facility]

## 2022-08-09 ENCOUNTER — Other Ambulatory Visit: Payer: Self-pay

## 2022-08-11 ENCOUNTER — Other Ambulatory Visit: Payer: Self-pay

## 2022-08-11 MED ORDER — MELOXICAM 15 MG PO TABS
15.0000 mg | ORAL_TABLET | Freq: Every day | ORAL | 2 refills | Status: DC
  Filled 2022-08-11: qty 30, 30d supply, fill #0
  Filled 2022-09-15: qty 30, 30d supply, fill #1
  Filled 2022-10-09: qty 30, 30d supply, fill #2

## 2022-08-12 ENCOUNTER — Other Ambulatory Visit: Payer: Self-pay

## 2022-08-15 ENCOUNTER — Other Ambulatory Visit: Payer: Self-pay | Admitting: Physician Assistant

## 2022-08-15 DIAGNOSIS — E1169 Type 2 diabetes mellitus with other specified complication: Secondary | ICD-10-CM

## 2022-08-18 ENCOUNTER — Other Ambulatory Visit: Payer: Self-pay

## 2022-08-18 MED ORDER — OMEGA-3-ACID ETHYL ESTERS 1 G PO CAPS
1.0000 | ORAL_CAPSULE | Freq: Two times a day (BID) | ORAL | 0 refills | Status: DC
  Filled 2022-08-18: qty 60, 30d supply, fill #0
  Filled 2022-09-15: qty 60, 30d supply, fill #1
  Filled 2022-10-10 (×2): qty 60, 30d supply, fill #2

## 2022-08-18 NOTE — Telephone Encounter (Signed)
Requested Prescriptions  Pending Prescriptions Disp Refills   omega-3 acid ethyl esters (LOVAZA) 1 g capsule 180 capsule 0    Sig: TAKE 1 CAPSULE BY MOUTH 2 TIMES DAILY.     Endocrinology:  Nutritional Agents - omega-3 acid ethyl esters Failed - 08/15/2022  5:52 PM      Failed - Lipid Panel in normal range within the last 12 months    Cholesterol, Total  Date Value Ref Range Status  04/17/2022 201 (H) 100 - 199 mg/dL Final   LDL Chol Calc (NIH)  Date Value Ref Range Status  04/17/2022 127 (H) 0 - 99 mg/dL Final   HDL  Date Value Ref Range Status  04/17/2022 46 >39 mg/dL Final   Triglycerides  Date Value Ref Range Status  04/17/2022 160 (H) 0 - 149 mg/dL Final         Passed - Valid encounter within last 12 months    Recent Outpatient Visits           1 month ago Type 2 diabetes mellitus with diabetic polyneuropathy, with long-term current use of insulin (HCC)   [place] [facility] [place], [place], [place]   4 months ago Type 2 diabetes mellitus with diabetic polyneuropathy, with long-term current use of insulin [facility])   [place] [facility] [place], [place], [place]   10 months ago Type 2 diabetes mellitus with diabetic polyneuropathy, with long-term current use of insulin (HCC)   [place] [facility] [place], [place], [place]   1 year ago Type 2 diabetes mellitus with diabetic polyneuropathy, with long-term current use of insulin (HCC)   [place] [facility] [place], Odette Horns, MD   1 year ago Type 2 diabetes mellitus with diabetic polyneuropathy, with long-term current use of insulin [facility])   [place] [facility] [place], Cornelius Moras, RPH-CPP       Future Appointments             In 2 months Hoy Register, MD [facility]

## 2022-08-20 ENCOUNTER — Other Ambulatory Visit: Payer: Self-pay

## 2022-08-20 ENCOUNTER — Other Ambulatory Visit (HOSPITAL_COMMUNITY): Payer: Self-pay

## 2022-08-21 ENCOUNTER — Other Ambulatory Visit: Payer: Self-pay

## 2022-08-22 ENCOUNTER — Other Ambulatory Visit: Payer: Self-pay

## 2022-09-01 ENCOUNTER — Other Ambulatory Visit: Payer: Self-pay

## 2022-09-01 ENCOUNTER — Telehealth: Payer: Self-pay

## 2022-09-01 NOTE — Telephone Encounter (Signed)
Spoke to patient over the phone on 08/21/22. Patient is aware that Jardiance enrollment in the [facility] Cares PAF is expired and re-enrollment in the program is required for refills. Proof of income was also requested from the patient for application processing. Application was mailed to patient with a refill from the pharmacy 08/21/2022. A 59-month supply of Jardiance was filled via a free trial card and patient is aware that [place] Pepper is not offered at a discounted price for uninsured patients. Refills will be cash price without re-enrollment.

## 2022-09-02 ENCOUNTER — Other Ambulatory Visit: Payer: Self-pay

## 2022-09-05 ENCOUNTER — Other Ambulatory Visit: Payer: Self-pay

## 2022-09-08 ENCOUNTER — Other Ambulatory Visit: Payer: Self-pay

## 2022-09-09 ENCOUNTER — Other Ambulatory Visit: Payer: Self-pay

## 2022-09-12 ENCOUNTER — Other Ambulatory Visit: Payer: Self-pay

## 2022-09-15 ENCOUNTER — Other Ambulatory Visit: Payer: Self-pay

## 2022-09-16 ENCOUNTER — Other Ambulatory Visit: Payer: Self-pay

## 2022-09-19 ENCOUNTER — Other Ambulatory Visit (HOSPITAL_COMMUNITY): Payer: Self-pay

## 2022-09-19 ENCOUNTER — Other Ambulatory Visit: Payer: Self-pay

## 2022-09-23 ENCOUNTER — Other Ambulatory Visit: Payer: Self-pay

## 2022-09-29 ENCOUNTER — Other Ambulatory Visit: Payer: Self-pay

## 2022-10-01 ENCOUNTER — Other Ambulatory Visit: Payer: Self-pay

## 2022-10-02 ENCOUNTER — Other Ambulatory Visit: Payer: Self-pay

## 2022-10-03 ENCOUNTER — Other Ambulatory Visit: Payer: Self-pay

## 2022-10-06 ENCOUNTER — Other Ambulatory Visit: Payer: Self-pay

## 2022-10-10 ENCOUNTER — Other Ambulatory Visit: Payer: Self-pay | Admitting: Physician Assistant

## 2022-10-10 ENCOUNTER — Other Ambulatory Visit: Payer: Self-pay

## 2022-10-10 DIAGNOSIS — E1169 Type 2 diabetes mellitus with other specified complication: Secondary | ICD-10-CM

## 2022-10-10 MED ORDER — EZETIMIBE 10 MG PO TABS
10.0000 mg | ORAL_TABLET | Freq: Every day | ORAL | 0 refills | Status: DC
Start: 2022-10-10 — End: 2022-11-19
  Filled 2022-10-10: qty 90, 90d supply, fill #0

## 2022-10-22 ENCOUNTER — Other Ambulatory Visit: Payer: Self-pay

## 2022-10-22 ENCOUNTER — Encounter: Payer: Self-pay | Admitting: Nurse Practitioner

## 2022-10-22 ENCOUNTER — Ambulatory Visit: Payer: Self-pay | Attending: Nurse Practitioner | Admitting: Nurse Practitioner

## 2022-10-22 VITALS — BP 119/70 | HR 71 | Ht 75.0 in | Wt 375.8 lb

## 2022-10-22 DIAGNOSIS — L03116 Cellulitis of left lower limb: Secondary | ICD-10-CM

## 2022-10-22 MED ORDER — SULFAMETHOXAZOLE-TRIMETHOPRIM 800-160 MG PO TABS
1.0000 | ORAL_TABLET | Freq: Two times a day (BID) | ORAL | 0 refills | Status: AC
Start: 2022-10-22 — End: 2022-11-01
  Filled 2022-10-22: qty 20, 10d supply, fill #0

## 2022-10-22 NOTE — Progress Notes (Signed)
Assessment & Plan:  Dillon Mcgee was seen today for blister.  Diagnoses and all orders for this visit:  Left leg cellulitis -     sulfamethoxazole-trimethoprim (BACTRIM DS) 800-160 MG tablet; Take 1 tablet by mouth 2 (two) times daily for 10 days. -     DG Tibia/Fibula Left; Future -     CBC with Differential     Patient has been counseled on age-appropriate routine health concerns for screening and prevention. These are reviewed and up-to-date. Referrals have been placed accordingly. Immunizations are up-to-date or declined.    Subjective:   Chief Complaint  Patient presents with   Blister   HPI Dillon Mcgee 62 y.o. male presents to office today with complaints of  possible skin infection.   Dillon Mcgee states he hit his leg against the corner of the bed over 1 week ago. Since then there have been blisters forming on the lower leg with purulent drainage due to him unroofing a few of the blisters.  He has a history of Diabetes. There is associated swelling of the lower leg and lower foot. I recommended xray to rule out osteo however he declines this today due to financial difficulties. . Lab Results  Component Value Date   HGBA1C 7.4 (A) 07/16/2022     Review of Systems  Constitutional:  Negative for fever, malaise/fatigue and weight loss.  HENT: Negative.  Negative for nosebleeds.   Eyes: Negative.  Negative for blurred vision, double vision and photophobia.  Respiratory: Negative.  Negative for cough and shortness of breath.   Cardiovascular: Negative.  Negative for chest pain, palpitations and leg swelling.  Gastrointestinal: Negative.  Negative for heartburn, nausea and vomiting.  Musculoskeletal: Negative.  Negative for myalgias.  Skin:        SEE HPI  Neurological: Negative.  Negative for dizziness, focal weakness, seizures and headaches.  Psychiatric/Behavioral: Negative.  Negative for suicidal ideas.     Past Medical History:  Diagnosis Date   Diabetes  mellitus without complication (HCC)    Hyperlipidemia    Hypertension     Past Surgical History:  Procedure Laterality Date   COLONOSCOPY     5 years ago     Family History  Problem Relation Age of Onset   Heart disease Maternal Aunt    Diabetes Maternal Uncle     Social History Reviewed with no changes to be made today.   Outpatient Medications Prior to Visit  Medication Sig Dispense Refill   amLODipine (NORVASC) 5 MG tablet Take 1 tablet (5 mg total) by mouth daily. 90 tablet 1   aspirin EC 81 MG tablet Take 1 tablet (81 mg total) daily by mouth. 90 tablet 3   Blood Glucose Monitoring Suppl (TRUE METRIX GO GLUCOSE METER) w/Device KIT 1 each by Does not apply route every 8 (eight) hours as needed. 1 kit 0   Dulaglutide (TRULICITY) 4.5 MG/0.5ML SOPN Inject 4.5 mg as directed once a week. 2 mL 3   empagliflozin (JARDIANCE) 25 MG TABS tablet Take 1 tablet (25 mg total) by mouth daily before breakfast. 90 tablet 1   ezetimibe (ZETIA) 10 MG tablet Take 1 tablet (10 mg total) by mouth daily. 90 tablet 0   ferrous sulfate 325 (65 FE) MG tablet Take 325 mg by mouth daily with breakfast.     finasteride (PROSCAR) 5 MG tablet Take 1 tablet (5 mg total) by mouth daily. 90 tablet 1   gabapentin (NEURONTIN) 300 MG capsule Take 1 capsule (300 mg  total) by mouth 3 (three) times daily. 90 capsule 6   glipiZIDE (GLUCOTROL) 10 MG tablet TAKE 1 TABLET (10 MG TOTAL) BY MOUTH in the morning and 0.5 tablet (5mg  total) by mouth in the evening. 45 tablet 6   glucose blood (TRUE METRIX BLOOD GLUCOSE TEST) test strip Use as instructed 3 times daily. 100 strip 12   Insulin Glargine (BASAGLAR KWIKPEN) 100 UNIT/ML Inject 40 Units into the skin 2 (two) times daily. 30 mL 2   Insulin Pen Needle (TRUEPLUS PEN NEEDLES) 32G X 4 MM MISC use as directed to inject insulin twice daily. 100 each 6   Insulin Syringe-Needle U-100 (TRUEPLUS INSULIN SYRINGE) 30G X 5/16" 0.5 ML MISC Use as directed to inject Levemir twice  daily. 100 each 1   meloxicam (MOBIC) 15 MG tablet Take 1 tablet (15 mg total) by mouth daily. 30 tablet 2   omega-3 acid ethyl esters (LOVAZA) 1 g capsule TAKE 1 CAPSULE BY MOUTH 2 TIMES DAILY. 180 capsule 0   tamsulosin (FLOMAX) 0.4 MG CAPS capsule Take 1 capsule (0.4 mg total) by mouth daily. 90 capsule 1   TRUEPLUS LANCETS 26G MISC 1 each by Does not apply route every 8 (eight) hours as needed. 100 each 12   calcipotriene-betamethasone (TACLONEX) ointment Apply ointment to affected area(s) daily. (Patient not taking: Reported on 10/22/2022) 100 g 1   No facility-administered medications prior to visit.    Allergies  Allergen Reactions   Lisinopril Other (See Comments)    Hyperkalemia    Pioglitazone Swelling    Lower extremity swelling.       Objective:    BP 119/70 (BP Location: Left Arm, Patient Position: Sitting, Cuff Size: Normal)   Pulse 71   Ht 6\' 3"  (1.905 m)   Wt (!) 375 lb 12.8 oz (170.5 kg)   SpO2 98%   BMI 46.97 kg/m  Wt Readings from Last 3 Encounters:  10/22/22 (!) 375 lb 12.8 oz (170.5 kg)  07/16/22 (!) 376 lb 6.4 oz (170.7 kg)  04/17/22 (!) 361 lb 6.4 oz (163.9 kg)    Physical Exam Vitals and nursing note reviewed.  Constitutional:      Appearance: He is well-developed.  HENT:     Head: Normocephalic and atraumatic.  Cardiovascular:     Rate and Rhythm: Normal rate and regular rhythm.     Heart sounds: Normal heart sounds. No murmur heard.    No friction rub. No gallop.  Pulmonary:     Effort: Pulmonary effort is normal. No tachypnea or respiratory distress.     Breath sounds: Normal breath sounds. No decreased breath sounds, wheezing, rhonchi or rales.  Chest:     Chest wall: No tenderness.  Abdominal:     General: Bowel sounds are normal.     Palpations: Abdomen is soft.  Musculoskeletal:        General: Normal range of motion.     Cervical back: Normal range of motion.  Skin:    General: Skin is warm and dry.     Comments: SEE PHOTO   Neurological:     Mental Status: He is alert and oriented to person, place, and time.     Coordination: Coordination normal.  Psychiatric:        Behavior: Behavior normal. Behavior is cooperative.        Thought Content: Thought content normal.        Judgment: Judgment normal.          Patient has been  counseled extensively about nutrition and exercise as well as the importance of adherence with medications and regular follow-up. The patient was given clear instructions to go to ER or return to medical center if symptoms don't improve, worsen or new problems develop. The patient verbalized understanding.   Follow-up: Return if symptoms worsen or fail to improve, for keep appt with newlin.   Claiborne Rigg, FNP-BC [facility] [place], [place] [phone]   10/22/2022, 10:26 AM

## 2022-10-23 LAB — CBC WITH DIFFERENTIAL/PLATELET
Basophils Absolute: 0 10*3/uL (ref 0.0–0.2)
Basos: 1 %
EOS (ABSOLUTE): 0.1 10*3/uL (ref 0.0–0.4)
Eos: 3 %
Hematocrit: 44.8 % (ref 37.5–51.0)
Hemoglobin: 14.6 g/dL (ref 13.0–17.7)
Immature Grans (Abs): 0 10*3/uL (ref 0.0–0.1)
Immature Granulocytes: 1 %
Lymphocytes Absolute: 1.4 10*3/uL (ref 0.7–3.1)
Lymphs: 30 %
MCH: 30.9 pg (ref 26.6–33.0)
MCHC: 32.6 g/dL (ref 31.5–35.7)
MCV: 95 fL (ref 79–97)
Monocytes Absolute: 0.6 10*3/uL (ref 0.1–0.9)
Monocytes: 13 %
Neutrophils Absolute: 2.4 10*3/uL (ref 1.4–7.0)
Neutrophils: 52 %
Platelets: 210 10*3/uL (ref 150–450)
RBC: 4.72 x10E6/uL (ref 4.14–5.80)
RDW: 11.8 % (ref 11.6–15.4)
WBC: 4.7 10*3/uL (ref 3.4–10.8)

## 2022-10-29 ENCOUNTER — Ambulatory Visit: Payer: Self-pay | Admitting: Family Medicine

## 2022-11-10 ENCOUNTER — Other Ambulatory Visit: Payer: Self-pay | Admitting: Physician Assistant

## 2022-11-10 ENCOUNTER — Other Ambulatory Visit: Payer: Self-pay

## 2022-11-10 DIAGNOSIS — E1159 Type 2 diabetes mellitus with other circulatory complications: Secondary | ICD-10-CM

## 2022-11-10 MED ORDER — AMLODIPINE BESYLATE 5 MG PO TABS
5.0000 mg | ORAL_TABLET | Freq: Every day | ORAL | 1 refills | Status: DC
Start: 2022-11-10 — End: 2023-05-14
  Filled 2022-11-10: qty 90, 90d supply, fill #0
  Filled 2023-01-30 – 2023-02-02 (×3): qty 90, 90d supply, fill #1

## 2022-11-11 ENCOUNTER — Other Ambulatory Visit: Payer: Self-pay

## 2022-11-12 ENCOUNTER — Other Ambulatory Visit: Payer: Self-pay | Admitting: Family Medicine

## 2022-11-12 DIAGNOSIS — Z794 Long term (current) use of insulin: Secondary | ICD-10-CM

## 2022-11-13 ENCOUNTER — Other Ambulatory Visit: Payer: Self-pay

## 2022-11-13 MED ORDER — BASAGLAR KWIKPEN 100 UNIT/ML ~~LOC~~ SOPN
40.0000 [IU] | PEN_INJECTOR | Freq: Two times a day (BID) | SUBCUTANEOUS | 2 refills | Status: DC
  Filled 2022-11-13: qty 30, 38d supply, fill #0

## 2022-11-16 ENCOUNTER — Other Ambulatory Visit: Payer: Self-pay | Admitting: Physician Assistant

## 2022-11-16 ENCOUNTER — Other Ambulatory Visit: Payer: Self-pay | Admitting: Family Medicine

## 2022-11-16 DIAGNOSIS — N401 Enlarged prostate with lower urinary tract symptoms: Secondary | ICD-10-CM

## 2022-11-16 DIAGNOSIS — E1169 Type 2 diabetes mellitus with other specified complication: Secondary | ICD-10-CM

## 2022-11-17 ENCOUNTER — Other Ambulatory Visit: Payer: Self-pay

## 2022-11-18 ENCOUNTER — Other Ambulatory Visit: Payer: Self-pay

## 2022-11-18 MED ORDER — MELOXICAM 15 MG PO TABS
15.0000 mg | ORAL_TABLET | Freq: Every day | ORAL | 0 refills | Status: DC
  Filled 2022-11-18: qty 90, 90d supply, fill #0

## 2022-11-18 MED ORDER — TAMSULOSIN HCL 0.4 MG PO CAPS
0.4000 mg | ORAL_CAPSULE | Freq: Every day | ORAL | 0 refills | Status: DC
Start: 2022-11-18 — End: 2022-11-19
  Filled 2022-11-18: qty 90, 90d supply, fill #0

## 2022-11-18 MED ORDER — OMEGA-3-ACID ETHYL ESTERS 1 G PO CAPS
1.0000 | ORAL_CAPSULE | Freq: Two times a day (BID) | ORAL | 0 refills | Status: DC
Start: 2022-11-18 — End: 2023-02-19
  Filled 2022-11-18: qty 60, 30d supply, fill #0
  Filled 2022-12-18: qty 60, 30d supply, fill #1
  Filled 2023-01-18: qty 60, 30d supply, fill #2

## 2022-11-18 NOTE — Telephone Encounter (Signed)
Requested Prescriptions  Pending Prescriptions Disp Refills   tamsulosin (FLOMAX) 0.4 MG CAPS capsule 90 capsule 0    Sig: Take 1 capsule (0.4 mg total) by mouth daily.     Urology: Alpha-Adrenergic Blocker Failed - 11/16/2022  4:56 PM      Failed - PSA in normal range and within 360 days    Prostate Specific Ag, Serum  Date Value Ref Range Status  01/18/2019 0.6 0.0 - 4.0 ng/mL Final    Comment:    Roche ECLIA methodology. According to the American Urological Association, Serum PSA should decrease and remain at undetectable levels after radical prostatectomy. The AUA defines biochemical recurrence as an initial PSA value 0.2 ng/mL or greater followed by a subsequent confirmatory PSA value 0.2 ng/mL or greater. Values obtained with different assay methods or kits cannot be used interchangeably. Results cannot be interpreted as absolute evidence of the presence or absence of malignant disease.          Passed - Last BP in normal range    BP Readings from Last 1 Encounters:  10/22/22 119/70         Passed - Valid encounter within last 12 months    Recent Outpatient Visits           3 weeks ago Left leg cellulitis   [place] [place], [place] W, NP   4 months ago Type 2 diabetes mellitus with diabetic polyneuropathy, with long-term current use of insulin (HCC)   [place] [facility] [place], Odette Horns, MD   7 months ago Type 2 diabetes mellitus with diabetic polyneuropathy, with long-term current use of insulin [facility])   [place] [facility] [place], [place], [place]   1 year ago Type 2 diabetes mellitus with diabetic polyneuropathy, with long-term current use of insulin (HCC)   [place] [facility] [place], Odette Horns, MD   1 year ago Type 2 diabetes mellitus with diabetic polyneuropathy, with long-term current use of insulin [facility])   [place] [facility] [place], Odette Horns, MD       Future Appointments             Tomorrow Hoy Register, MD [facility]

## 2022-11-18 NOTE — Telephone Encounter (Signed)
Requested Prescriptions  Pending Prescriptions Disp Refills   meloxicam (MOBIC) 15 MG tablet 90 tablet 0    Sig: Take 1 tablet (15 mg total) by mouth daily.     Analgesics:  COX2 Inhibitors Failed - 11/16/2022  4:56 PM      Failed - Manual Review: Labs are only required if the patient has taken medication for more than 8 weeks.      Passed - HGB in normal range and within 360 days    Hemoglobin  Date Value Ref Range Status  10/22/2022 14.6 13.0 - 17.7 g/dL Final         Passed - Cr in normal range and within 360 days    Creat  Date Value Ref Range Status  12/03/2015 0.85 0.70 - 1.33 mg/dL Final    Comment:      For patients > or = 60 years of age: The upper reference limit for Creatinine is approximately 13% higher for people identified as African-American.      Creatinine, Ser  Date Value Ref Range Status  04/17/2022 1.05 0.76 - 1.27 mg/dL Final   Creatinine, POC  Date Value Ref Range Status  08/14/2016 200 mg/dL Final         Passed - HCT in normal range and within 360 days    Hematocrit  Date Value Ref Range Status  10/22/2022 44.8 37.5 - 51.0 % Final         Passed - AST in normal range and within 360 days    AST  Date Value Ref Range Status  04/17/2022 20 0 - 40 IU/L Final         Passed - ALT in normal range and within 360 days    ALT  Date Value Ref Range Status  04/17/2022 24 0 - 44 IU/L Final         Passed - eGFR is 30 or above and within 360 days    GFR, Est African American  Date Value Ref Range Status  12/03/2015 >89 >=60 mL/min Final   GFR calc Af Amer  Date Value Ref Range Status  02/14/2020 94 >59 mL/min/1.73 Final    Comment:    **In accordance with recommendations from the NKF-ASN Task force,**   [facility] is in the process of updating its eGFR calculation to the   2021 CKD-EPI creatinine equation that estimates kidney function   without a race variable.    GFR, Est Non African American  Date Value Ref Range Status  12/03/2015 >89  >=60 mL/min Final   GFR calc non Af Amer  Date Value Ref Range Status  02/14/2020 81 >59 mL/min/1.73 Final   eGFR  Date Value Ref Range Status  04/17/2022 82 >59 mL/min/1.73 Final         Passed - Patient is not pregnant      Passed - Valid encounter within last 12 months    Recent Outpatient Visits           3 weeks ago Left leg cellulitis   [facility] [place], [place] W, NP   4 months ago Type 2 diabetes mellitus with diabetic polyneuropathy, with long-term current use of insulin (HCC)   [place] [facility] [place], Odette Horns, MD   7 months ago Type 2 diabetes mellitus with diabetic polyneuropathy, with long-term current use of insulin [facility])   [place] [facility] [place], [place], [place]   1 year  ago Type 2 diabetes mellitus with diabetic polyneuropathy, with long-term current use of insulin (HCC)   [place] [facility] [place], [place], MD   1 year ago Type 2 diabetes mellitus with diabetic polyneuropathy, with long-term current use of insulin [facility])   [place] [facility] Hoy Register, MD       Future Appointments             Tomorrow Hoy Register, MD [facility]             omega-3 acid ethyl esters (LOVAZA) 1 g capsule 180 capsule 0    Sig: TAKE 1 CAPSULE BY MOUTH 2 TIMES DAILY.     Endocrinology:  Nutritional Agents - omega-3 acid ethyl esters Failed - 11/16/2022  4:56 PM      Failed - Lipid Panel in normal range within the last 12 months    Cholesterol, Total  Date Value Ref Range Status  04/17/2022 201 (H) 100 - 199 mg/dL Final   LDL Chol Calc (NIH)  Date Value Ref Range Status  04/17/2022 127 (H) 0 - 99 mg/dL Final   HDL  Date Value Ref Range Status  04/17/2022 46 >39 mg/dL Final   Triglycerides  Date Value Ref Range Status  04/17/2022 160 (H) 0 - 149 mg/dL Final          Passed - Valid encounter within last 12 months    Recent Outpatient Visits           3 weeks ago Left leg cellulitis   [place] [facility] [place], [place] W, NP   4 months ago Type 2 diabetes mellitus with diabetic polyneuropathy, with long-term current use of insulin (HCC)   [place] [facility] [place], [place], MD   7 months ago Type 2 diabetes mellitus with diabetic polyneuropathy, with long-term current use of insulin [facility])   [place] [facility] [place], [place], [place]   1 year ago Type 2 diabetes mellitus with diabetic polyneuropathy, with long-term current use of insulin (HCC)   [place] [facility] [place], [place], MD   1 year ago Type 2 diabetes mellitus with diabetic polyneuropathy, with long-term current use of insulin [facility])   [place] [facility] [place], Odette Horns, MD       Future Appointments             Tomorrow Hoy Register, MD [facility]

## 2022-11-19 ENCOUNTER — Encounter: Payer: Self-pay | Admitting: Family Medicine

## 2022-11-19 ENCOUNTER — Ambulatory Visit: Payer: Self-pay | Attending: Family Medicine | Admitting: Family Medicine

## 2022-11-19 ENCOUNTER — Other Ambulatory Visit: Payer: Self-pay

## 2022-11-19 VITALS — BP 125/74 | HR 60 | Ht 75.0 in | Wt 379.2 lb

## 2022-11-19 DIAGNOSIS — Z794 Long term (current) use of insulin: Secondary | ICD-10-CM

## 2022-11-19 DIAGNOSIS — E785 Hyperlipidemia, unspecified: Secondary | ICD-10-CM

## 2022-11-19 DIAGNOSIS — Z23 Encounter for immunization: Secondary | ICD-10-CM

## 2022-11-19 DIAGNOSIS — N401 Enlarged prostate with lower urinary tract symptoms: Secondary | ICD-10-CM

## 2022-11-19 DIAGNOSIS — L03116 Cellulitis of left lower limb: Secondary | ICD-10-CM

## 2022-11-19 DIAGNOSIS — E11622 Type 2 diabetes mellitus with other skin ulcer: Secondary | ICD-10-CM

## 2022-11-19 DIAGNOSIS — L97929 Non-pressure chronic ulcer of unspecified part of left lower leg with unspecified severity: Secondary | ICD-10-CM

## 2022-11-19 DIAGNOSIS — G720 Drug-induced myopathy: Secondary | ICD-10-CM

## 2022-11-19 DIAGNOSIS — E1169 Type 2 diabetes mellitus with other specified complication: Secondary | ICD-10-CM

## 2022-11-19 DIAGNOSIS — E1142 Type 2 diabetes mellitus with diabetic polyneuropathy: Secondary | ICD-10-CM

## 2022-11-19 DIAGNOSIS — R351 Nocturia: Secondary | ICD-10-CM

## 2022-11-19 LAB — POCT GLYCOSYLATED HEMOGLOBIN (HGB A1C): HbA1c, POC (controlled diabetic range): 7.6 % — AB (ref 0.0–7.0)

## 2022-11-19 LAB — GLUCOSE, POCT (MANUAL RESULT ENTRY): POC Glucose: 68 mg/dL — AB (ref 70–99)

## 2022-11-19 MED ORDER — MELOXICAM 15 MG PO TABS
15.0000 mg | ORAL_TABLET | Freq: Every day | ORAL | 0 refills | Status: DC
  Filled 2022-11-19 – 2023-02-15 (×2): qty 90, 90d supply, fill #0

## 2022-11-19 MED ORDER — TAMSULOSIN HCL 0.4 MG PO CAPS
0.4000 mg | ORAL_CAPSULE | Freq: Every day | ORAL | 1 refills | Status: DC
  Filled 2022-11-19 – 2023-02-15 (×2): qty 90, 90d supply, fill #0

## 2022-11-19 MED ORDER — EZETIMIBE 10 MG PO TABS
10.0000 mg | ORAL_TABLET | Freq: Every day | ORAL | 1 refills | Status: DC
  Filled 2022-11-19 – 2023-01-18 (×2): qty 90, 90d supply, fill #0
  Filled 2023-04-27: qty 90, 90d supply, fill #1

## 2022-11-19 MED ORDER — DOXYCYCLINE HYCLATE 100 MG PO TABS
100.0000 mg | ORAL_TABLET | Freq: Two times a day (BID) | ORAL | 0 refills | Status: DC
  Filled 2022-11-19: qty 20, 10d supply, fill #0

## 2022-11-19 MED ORDER — GLIPIZIDE 10 MG PO TABS
ORAL_TABLET | ORAL | 6 refills | Status: DC
  Filled 2022-11-19: qty 45, fill #0
  Filled 2022-12-10: qty 45, 30d supply, fill #0
  Filled 2023-01-18: qty 45, 30d supply, fill #1
  Filled 2023-02-19: qty 45, 30d supply, fill #2
  Filled 2023-03-21: qty 45, 30d supply, fill #3
  Filled 2023-04-26: qty 45, 30d supply, fill #4

## 2022-11-19 MED ORDER — BASAGLAR KWIKPEN 100 UNIT/ML ~~LOC~~ SOPN
40.0000 [IU] | PEN_INJECTOR | Freq: Two times a day (BID) | SUBCUTANEOUS | 6 refills | Status: DC
  Filled 2022-11-19 – 2022-12-18 (×2): qty 30, 38d supply, fill #0
  Filled 2023-02-02: qty 30, 38d supply, fill #1
  Filled 2023-02-03 – 2023-03-20 (×2): qty 30, 38d supply, fill #2
  Filled 2023-04-27: qty 30, 38d supply, fill #3

## 2022-11-19 MED ORDER — EMPAGLIFLOZIN 25 MG PO TABS
25.0000 mg | ORAL_TABLET | Freq: Every day | ORAL | 1 refills | Status: DC
Start: 2022-11-19 — End: 2023-05-20
  Filled 2022-11-19 – 2022-12-11 (×2): qty 90, 90d supply, fill #0

## 2022-11-19 NOTE — Patient Instructions (Signed)

## 2022-11-19 NOTE — Progress Notes (Signed)
Subjective:  Patient ID: Dillon Mcgee, male    DOB: [birth year]  Age: 62 y.o. MRN: [id number]  CC: Medical Management of Chronic Issues (Cramps in both thighs/Frequent urination)   HPI Dillon Mcgee is a 62 y.o. year old male with a history of hypertension, type 2 diabetes mellitus (A1c 7.6), hyperlipidemia, statin intolerance, obesity here for follow-up visit.   Interval History: Discussed the use of AI scribe software for clinical note transcription with the patient, who gave verbal consent to proceed.  She presents with a healing wound on the left leg. The patient reports hitting the leg on a corner about a week ago, which resulted in two blisters. The blisters were cleaned out and the patient was given antibiotics at his visit with the nurse practitioner 1 month ago. The patient reports that the wound is healing well, but notes that the skin around the wound gets flaky. The patient has been applying a diabetic cream from the [employer], which he believes is helping. The patient also mentions a history of a slipped disc from a fall in 2010, which sometimes causes muscle pain in his thighs. The patient is currently on meloxicam for 'muscle relaxation'. The patient had previously been on atorvastatin for high cholesterol but had to stop due to myalgias.  I had referred him to the cardiology lipid clinic to discuss initiation of PC SK 9 inhibitor however the cardiology clinic had been unsuccessful in reaching him last year. The patient is currently on amlodipine for hypertension.    A1c is 7.6 and he endorses adherence with his diabetic regimen   Past Medical History:  Diagnosis Date   Diabetes mellitus without complication (HCC)    Hyperlipidemia    Hypertension     Past Surgical History:  Procedure Laterality Date   COLONOSCOPY     5 years ago     Family History  Problem Relation Age of Onset   Heart disease Maternal Aunt    Diabetes Maternal Uncle     Social History    Socioeconomic History   Marital status: Single    Spouse name: Not on file   Number of children: Not on file   Years of education: Not on file   Highest education level: Not on file  Occupational History   Not on file  Tobacco Use   Smoking status: Never   Smokeless tobacco: Never  Vaping Use   Vaping status: Never Used  Substance and Sexual Activity   Alcohol use: No    Alcohol/week: 0.0 standard drinks of alcohol   Drug use: Not on file   Sexual activity: Not on file  Other Topics Concern   Not on file  Social History Narrative   Not on file   Social Determinants of Health   Financial Resource Strain: Not on file  Food Insecurity: Not on file  Transportation Needs: Not on file  Physical Activity: Not on file  Stress: Not on file  Social Connections: Not on file    Allergies  Allergen Reactions   Lisinopril Other (See Comments)    Hyperkalemia    Pioglitazone Swelling    Lower extremity swelling.    Outpatient Medications Prior to Visit  Medication Sig Dispense Refill   amLODipine (NORVASC) 5 MG tablet Take 1 tablet (5 mg total) by mouth daily. 90 tablet 1   aspirin EC 81 MG tablet Take 1 tablet (81 mg total) daily by mouth. 90 tablet 3   Blood Glucose Monitoring Suppl (TRUE METRIX  GO GLUCOSE METER) w/Device KIT 1 each by Does not apply route every 8 (eight) hours as needed. 1 kit 0   calcipotriene-betamethasone (TACLONEX) ointment Apply ointment to affected area(s) daily. 100 g 1   Dulaglutide (TRULICITY) 4.5 MG/0.5ML SOPN Inject 4.5 mg as directed once a week. 2 mL 3   ferrous sulfate 325 (65 FE) MG tablet Take 325 mg by mouth daily with breakfast.     finasteride (PROSCAR) 5 MG tablet Take 1 tablet (5 mg total) by mouth daily. 90 tablet 1   gabapentin (NEURONTIN) 300 MG capsule Take 1 capsule (300 mg total) by mouth 3 (three) times daily. 90 capsule 6   glucose blood (TRUE METRIX BLOOD GLUCOSE TEST) test strip Use as instructed 3 times daily. 100 strip 12    Insulin Pen Needle (TRUEPLUS PEN NEEDLES) 32G X 4 MM MISC use as directed to inject insulin twice daily. 100 each 6   Insulin Syringe-Needle U-100 (TRUEPLUS INSULIN SYRINGE) 30G X 5/16" 0.5 ML MISC Use as directed to inject Levemir twice daily. 100 each 1   omega-3 acid ethyl esters (LOVAZA) 1 g capsule TAKE 1 CAPSULE BY MOUTH 2 TIMES DAILY. 180 capsule 0   TRUEPLUS LANCETS 26G MISC 1 each by Does not apply route every 8 (eight) hours as needed. 100 each 12   empagliflozin (JARDIANCE) 25 MG TABS tablet Take 1 tablet (25 mg total) by mouth daily before breakfast. 90 tablet 1   ezetimibe (ZETIA) 10 MG tablet Take 1 tablet (10 mg total) by mouth daily. 90 tablet 0   glipiZIDE (GLUCOTROL) 10 MG tablet TAKE 1 TABLET (10 MG TOTAL) BY MOUTH in the morning and 0.5 tablet (5mg  total) by mouth in the evening. 45 tablet 6   Insulin Glargine (BASAGLAR KWIKPEN) 100 UNIT/ML Inject 40 Units into the skin 2 (two) times daily. 30 mL 2   meloxicam (MOBIC) 15 MG tablet Take 1 tablet (15 mg total) by mouth daily. 90 tablet 0   tamsulosin (FLOMAX) 0.4 MG CAPS capsule Take 1 capsule (0.4 mg total) by mouth daily. 90 capsule 0   No facility-administered medications prior to visit.     ROS Review of Systems  Constitutional:  Negative for activity change and appetite change.  HENT:  Negative for sinus pressure and sore throat.   Respiratory:  Negative for chest tightness, shortness of breath and wheezing.   Cardiovascular:  Negative for chest pain and palpitations.  Gastrointestinal:  Negative for abdominal distention, abdominal pain and constipation.  Genitourinary: Negative.   Musculoskeletal:        See HPI  Psychiatric/Behavioral:  Negative for behavioral problems and dysphoric mood.     Objective:  BP 125/74   Pulse 60   Ht 6\' 3"  (1.905 m)   Wt (!) 379 lb 3.2 oz (172 kg)   SpO2 94%   BMI 47.40 kg/m      11/19/2022   10:17 AM 10/22/2022    9:55 AM 07/16/2022    9:11 AM  BP/Weight  Systolic BP  125 409 134  Diastolic BP 74 70 76  Wt. (Lbs) 379.2 375.8 376.4  BMI 47.4 kg/m2 46.97 kg/m2 47.05 kg/m2      Physical Exam Constitutional:      Appearance: He is well-developed. He is obese.  Cardiovascular:     Rate and Rhythm: Normal rate.     Heart sounds: Normal heart sounds. No murmur heard. Pulmonary:     Effort: Pulmonary effort is normal.     Breath  sounds: Normal breath sounds. No wheezing or rales.  Chest:     Chest wall: No tenderness.  Abdominal:     General: Bowel sounds are normal. There is distension.     Palpations: Abdomen is soft. There is no mass.     Tenderness: There is no abdominal tenderness.  Musculoskeletal:        General: Normal range of motion.     Right lower leg: No edema.     Left lower leg: Edema present.  Neurological:     Mental Status: He is alert and oriented to person, place, and time.  Psychiatric:        Mood and Affect: Mood normal.         Latest Ref Rng & Units 04/17/2022    9:15 AM 10/15/2021    8:34 AM 04/02/2021    9:29 AM  CMP  Glucose 70 - 99 mg/dL 60  161  096   BUN 6 - 24 mg/dL 16  21  14    Creatinine 0.76 - 1.27 mg/dL 0.45  4.09  8.11   Sodium 134 - 144 mmol/L 146  145  147   Potassium 3.5 - 5.2 mmol/L 4.8  5.0  5.0   Chloride 96 - 106 mmol/L 106  106  105   CO2 20 - 29 mmol/L 25  24  26    Calcium 8.7 - 10.2 mg/dL 9.4  9.8  91.4   Total Protein 6.0 - 8.5 g/dL 6.7   7.3   Total Bilirubin 0.0 - 1.2 mg/dL 0.3   0.4   Alkaline Phos 44 - 121 IU/L 86   94   AST 0 - 40 IU/L 20   15   ALT 0 - 44 IU/L 24   12     Lipid Panel     Component Value Date/Time   CHOL 201 (H) 04/17/2022 0915   TRIG 160 (H) 04/17/2022 0915   HDL 46 04/17/2022 0915   CHOLHDL 4.4 04/17/2022 0915   CHOLHDL 3.5 06/07/2015 1005   VLDL 33 (H) 06/07/2015 1005   LDLCALC 127 (H) 04/17/2022 0915    CBC    Component Value Date/Time   WBC 4.7 10/22/2022 1035   WBC 7.4 12/03/2015 1704   RBC 4.72 10/22/2022 1035   RBC 4.17 (L) 12/03/2015 1704    HGB 14.6 10/22/2022 1035   HCT 44.8 10/22/2022 1035   PLT 210 10/22/2022 1035   MCV 95 10/22/2022 1035   MCH 30.9 10/22/2022 1035   MCH 30.2 12/03/2015 1704   MCHC 32.6 10/22/2022 1035   MCHC 32.4 12/03/2015 1704   RDW 11.8 10/22/2022 1035   LYMPHSABS 1.4 10/22/2022 1035   MONOABS 518 12/03/2015 1704   EOSABS 0.1 10/22/2022 1035   BASOSABS 0.0 10/22/2022 1035    Lab Results  Component Value Date   HGBA1C 7.6 (A) 11/19/2022    Assessment & Plan:      Cellulitis of the Left Leg Redness and swelling noted.  Patient reports a history of trauma to the area. No signs of systemic infection. -Refer to wound clinic for further evaluation and management. -Start another course of Doxycycline. -Advise patient to elevate leg when sitting.  Type 2 Diabetes Mellitus A1c 7.6, slightly above target less than 7.0  Previous reading of 7.4. -Continue current management. -Counseled on Diabetic diet, my plate method, 782 minutes of moderate intensity exercise/week Blood sugar logs with fasting goals of 80-120 mg/dl, random of less than 956 and in the event  of sugars less than 60 mg/dl or greater than 528 mg/dl encouraged to notify the clinic. Advised on the need for annual eye exams, annual foot exams, Pneumonia vaccine.   Hyperlipidemia Patient previously taken off Atorvastatin due to myalgia. No current cholesterol medication. -Refer to Cardiology lipid clinic for evaluation and management of hyperlipidemia, considering non-statin options.  BPH Continues to experience symptoms Currently on Flomax Will check PSA    Colon cancer screening -Order stool test for colon cancer screening.          Meds ordered this encounter  Medications   empagliflozin (JARDIANCE) 25 MG TABS tablet    Sig: Take 1 tablet (25 mg total) by mouth daily before breakfast.    Dispense:  90 tablet    Refill:  1   ezetimibe (ZETIA) 10 MG tablet    Sig: Take 1 tablet (10 mg total) by mouth daily.     Dispense:  90 tablet    Refill:  1   glipiZIDE (GLUCOTROL) 10 MG tablet    Sig: Take 1 tablet (10 mg total) by mouth in the morning AND 0.5 tablets (5 mg total) every evening.    Dispense:  45 tablet    Refill:  6   Insulin Glargine (BASAGLAR KWIKPEN) 100 UNIT/ML    Sig: Inject 40 Units into the skin 2 (two) times daily.    Dispense:  30 mL    Refill:  6   meloxicam (MOBIC) 15 MG tablet    Sig: Take 1 tablet (15 mg total) by mouth daily.    Dispense:  90 tablet    Refill:  0   tamsulosin (FLOMAX) 0.4 MG CAPS capsule    Sig: Take 1 capsule (0.4 mg total) by mouth daily.    Dispense:  90 capsule    Refill:  1   doxycycline (VIBRA-TABS) 100 MG tablet    Sig: Take 1 tablet (100 mg total) by mouth 2 (two) times daily.    Dispense:  20 tablet    Refill:  0    Follow-up: Return in about 6 months (around 05/19/2023) for Chronic medical conditions.       Hoy Register, MD, FAAFP. [facility] [place], [place] [phone]   11/19/2022, 12:36 PM

## 2022-11-20 LAB — CMP14+EGFR
ALT: 19 IU/L (ref 0–44)
AST: 31 IU/L (ref 0–40)
Albumin: 4.3 g/dL (ref 3.8–4.9)
Alkaline Phosphatase: 92 IU/L (ref 44–121)
BUN/Creatinine Ratio: 13 (ref 10–24)
BUN: 14 mg/dL (ref 8–27)
Bilirubin Total: 0.4 mg/dL (ref 0.0–1.2)
CO2: 22 mmol/L (ref 20–29)
Calcium: 9.5 mg/dL (ref 8.6–10.2)
Chloride: 104 mmol/L (ref 96–106)
Creatinine, Ser: 1.06 mg/dL (ref 0.76–1.27)
Globulin, Total: 3.1 g/dL (ref 1.5–4.5)
Glucose: 54 mg/dL — ABNORMAL LOW (ref 70–99)
Potassium: 5.2 mmol/L (ref 3.5–5.2)
Sodium: 147 mmol/L — ABNORMAL HIGH (ref 134–144)
Total Protein: 7.4 g/dL (ref 6.0–8.5)
eGFR: 80 mL/min/{1.73_m2} (ref >59)

## 2022-11-20 LAB — MICROALBUMIN / CREATININE URINE RATIO
Creatinine, Urine: 90 mg/dL
Microalb/Creat Ratio: 74 mg/g creat — ABNORMAL HIGH (ref 0–29)
Microalbumin, Urine: 66.2 ug/mL

## 2022-11-20 LAB — PSA, TOTAL AND FREE
PSA, Free Pct: 10 %
PSA, Free: 0.02 ng/mL
Prostate Specific Ag, Serum: 0.2 ng/mL (ref 0.0–4.0)

## 2022-11-20 LAB — LP+NON-HDL CHOLESTEROL
Cholesterol, Total: 201 mg/dL — ABNORMAL HIGH (ref 100–199)
HDL: 61 mg/dL (ref >39)
LDL Chol Calc (NIH): 120 mg/dL — ABNORMAL HIGH (ref 0–99)
Total Non-HDL-Chol (LDL+VLDL): 140 mg/dL — ABNORMAL HIGH (ref 0–129)
Triglycerides: 115 mg/dL (ref 0–149)
VLDL Cholesterol Cal: 20 mg/dL (ref 5–40)

## 2022-11-28 ENCOUNTER — Other Ambulatory Visit: Payer: Self-pay | Admitting: Family Medicine

## 2022-12-01 ENCOUNTER — Other Ambulatory Visit: Payer: Self-pay

## 2022-12-01 MED ORDER — DOXYCYCLINE HYCLATE 100 MG PO TABS
100.0000 mg | ORAL_TABLET | Freq: Two times a day (BID) | ORAL | 0 refills | Status: DC
  Filled 2022-12-01 – 2022-12-04 (×3): qty 20, 10d supply, fill #0

## 2022-12-03 ENCOUNTER — Other Ambulatory Visit: Payer: Self-pay

## 2022-12-04 ENCOUNTER — Other Ambulatory Visit: Payer: Self-pay

## 2022-12-05 ENCOUNTER — Other Ambulatory Visit: Payer: Self-pay

## 2022-12-08 ENCOUNTER — Other Ambulatory Visit: Payer: Self-pay | Admitting: Family Medicine

## 2022-12-10 ENCOUNTER — Other Ambulatory Visit: Payer: Self-pay

## 2022-12-10 ENCOUNTER — Other Ambulatory Visit: Payer: Self-pay | Admitting: Physician Assistant

## 2022-12-10 DIAGNOSIS — Z794 Long term (current) use of insulin: Secondary | ICD-10-CM

## 2022-12-11 ENCOUNTER — Other Ambulatory Visit: Payer: Self-pay

## 2022-12-11 ENCOUNTER — Other Ambulatory Visit: Payer: Self-pay | Admitting: Physician Assistant

## 2022-12-11 DIAGNOSIS — N401 Enlarged prostate with lower urinary tract symptoms: Secondary | ICD-10-CM

## 2022-12-11 MED ORDER — GABAPENTIN 300 MG PO CAPS
300.0000 mg | ORAL_CAPSULE | Freq: Three times a day (TID) | ORAL | 6 refills | Status: DC
Start: 2022-12-11 — End: 2023-05-20
  Filled 2022-12-11: qty 90, 30d supply, fill #1
  Filled 2022-12-11: qty 90, 30d supply, fill #0
  Filled 2023-01-02: qty 90, 30d supply, fill #1
  Filled 2023-02-15: qty 90, 30d supply, fill #2
  Filled 2023-03-17 – 2023-03-19 (×3): qty 90, 30d supply, fill #3
  Filled 2023-04-27: qty 90, 30d supply, fill #4

## 2022-12-11 MED ORDER — FINASTERIDE 5 MG PO TABS
5.0000 mg | ORAL_TABLET | Freq: Every day | ORAL | 0 refills | Status: DC
  Filled 2022-12-11: qty 90, 90d supply, fill #0

## 2022-12-11 NOTE — Telephone Encounter (Signed)
Requested Prescriptions  Pending Prescriptions Disp Refills   finasteride (PROSCAR) 5 MG tablet 90 tablet 0    Sig: Take 1 tablet (5 mg total) by mouth daily.     Urology: 5-alpha Reductase Inhibitors Passed - 12/11/2022  3:35 PM      Passed - PSA in normal range and within 360 days    Prostate Specific Ag, Serum  Date Value Ref Range Status  11/19/2022 0.2 0.0 - 4.0 ng/mL Final    Comment:    Roche ECLIA methodology. According to the American Urological Association, Serum PSA should decrease and remain at undetectable levels after radical prostatectomy. The AUA defines biochemical recurrence as an initial PSA value 0.2 ng/mL or greater followed by a subsequent confirmatory PSA value 0.2 ng/mL or greater. Values obtained with different assay methods or kits cannot be used interchangeably. Results cannot be interpreted as absolute evidence of the presence or absence of malignant disease.          Passed - Valid encounter within last 12 months    Recent Outpatient Visits           3 weeks ago Statin myopathy   [place] [facility] [place], Odette Horns, MD   1 month ago Left leg cellulitis   [place] [facility] [place], [place] W, NP   4 months ago Type 2 diabetes mellitus with diabetic polyneuropathy, with long-term current use of insulin (HCC)   [place] [facility] [place], Odette Horns, MD   7 months ago Type 2 diabetes mellitus with diabetic polyneuropathy, with long-term current use of insulin [facility])   [place] [facility] [place], [place], [place]   1 year ago Type 2 diabetes mellitus with diabetic polyneuropathy, with long-term current use of insulin [facility])   [place] [facility] Hoy Register, MD       Future Appointments             In 6 days Duanne Guess, MD [facility] [place]   In 1 month Little Ishikawa, MD [facility]   In 5 months Hoy Register, MD [facility]

## 2022-12-17 ENCOUNTER — Encounter (HOSPITAL_BASED_OUTPATIENT_CLINIC_OR_DEPARTMENT_OTHER): Payer: Self-pay | Attending: General Surgery | Admitting: General Surgery

## 2022-12-17 DIAGNOSIS — I10 Essential (primary) hypertension: Secondary | ICD-10-CM | POA: Insufficient documentation

## 2022-12-17 DIAGNOSIS — L97812 Non-pressure chronic ulcer of other part of right lower leg with fat layer exposed: Secondary | ICD-10-CM | POA: Insufficient documentation

## 2022-12-17 DIAGNOSIS — Z6841 Body Mass Index (BMI) 40.0 and over, adult: Secondary | ICD-10-CM | POA: Insufficient documentation

## 2022-12-17 DIAGNOSIS — Z833 Family history of diabetes mellitus: Secondary | ICD-10-CM | POA: Insufficient documentation

## 2022-12-17 DIAGNOSIS — Z8249 Family history of ischemic heart disease and other diseases of the circulatory system: Secondary | ICD-10-CM | POA: Insufficient documentation

## 2022-12-17 DIAGNOSIS — I872 Venous insufficiency (chronic) (peripheral): Secondary | ICD-10-CM | POA: Insufficient documentation

## 2022-12-17 DIAGNOSIS — E11622 Type 2 diabetes mellitus with other skin ulcer: Secondary | ICD-10-CM | POA: Insufficient documentation

## 2022-12-17 DIAGNOSIS — L97822 Non-pressure chronic ulcer of other part of left lower leg with fat layer exposed: Secondary | ICD-10-CM | POA: Insufficient documentation

## 2022-12-17 NOTE — Progress Notes (Signed)
JABAR, KRYSIAK ([id number]) [id number] Page 1 of 4 Visit Report for 12/17/2022 Abuse Risk Screen Details Patient Name: Date of Service: Dillon Mcgee, Dillon Mcgee 12/17/2022 8:00 A M Medical Record Number: [id number] Patient Account Number: [id number] Date of Birth/Sex: Treating RN: 20-Sep-1960 (62 y.o. Yates Decamp Primary Care Dominque Levandowski: Hoy Register Other Clinician: Referring Anushka Hartinger: Treating Harace Mccluney/Extender: Camillo Flaming Weeks in Treatment: 0 Abuse Risk Screen Items Answer ABUSE RISK SCREEN: Has anyone close to you tried to hurt or harm you recentlyo No Do you feel uncomfortable with anyone in your familyo No Has anyone forced you do things that you didnt want to doo No Electronic Signature(s) Signed: 12/17/2022 4:07:14 PM By: Brenton Grills Entered By: Brenton Grills on 12/17/2022 07:59:40 -------------------------------------------------------------------------------- Activities of Daily Living Details Patient Name: Date of Service: Dillon Mcgee, Dillon Mcgee 12/17/2022 8:00 A M Medical Record Number: [id number] Patient Account Number: [id number] Date of Birth/Sex: Treating RN: 1960-07-24 (62 y.o. Yates Decamp Primary Care Jennings Stirling: Hoy Register Other Clinician: Referring Kairi Harshbarger: Treating Lyriq Jarchow/Extender: Camillo Flaming Weeks in Treatment: 0 Activities of Daily Living Items Answer Activities of Daily Living (Please select one for each item) Drive Automobile Completely Able T Medications ake Completely Able Use T elephone Completely Able Care for Appearance Completely Able Use T oilet Completely Able Bath / Shower Completely Able Dress Self Completely Able Feed Self Completely Able Walk Completely Able Get In / Out Bed Completely Able Housework Completely Able Prepare Meals Completely Able Handle Money Completely Able Shop for Self Completely Able Electronic Signature(s) Signed:  12/17/2022 4:07:14 PM By: Brenton Grills Entered By: Brenton Grills on 12/17/2022 08:00:11 [name] ([id number]) [phone] [id number] Page 2 of 4 -------------------------------------------------------------------------------- Education Screening Details Patient Name: Date of Service: Dillon Mcgee, Dillon Mcgee 12/17/2022 8:00 A M Medical Record Number: [id number] Patient Account Number: [id number] Date of Birth/Sex: Treating RN: 07/03/1960 (62 y.o. Yates Decamp Primary Care Delane Stalling: Hoy Register Other Clinician: Referring Lizett Chowning: Treating Luciana Cammarata/Extender: Camillo Flaming Weeks in Treatment: 0 Learning Preferences/Education Level/Primary Language Highest Education Level: High School Preferred Language: English Cognitive Barrier Language Barrier: No Translator Needed: No Memory Deficit: No Emotional Barrier: No Cultural/Religious Beliefs Affecting Medical Care: No Physical Barrier Impaired Vision: Yes Glasses Impaired Hearing: No Decreased Hand dexterity: No Knowledge/Comprehension Knowledge Level: Medium Comprehension Level: Medium Ability to understand written instructions: Medium Ability to understand verbal instructions: Medium Motivation Anxiety Level: Calm Cooperation: Cooperative Education Importance: Acknowledges Need Interest in Health Problems: Asks Questions Perception: Coherent Willingness to Engage in Self-Management High Activities: Readiness to Engage in Self-Management High Activities: Electronic Signature(s) Signed: 12/17/2022 4:07:14 PM By: Brenton Grills Entered By: Brenton Grills on 12/17/2022 08:00:51 -------------------------------------------------------------------------------- Fall Risk Assessment Details Patient Name: Date of Service: Dillon Mcgee 12/17/2022 8:00 A M Medical Record Number: [id number] Patient Account Number: [id number] Date of Birth/Sex: Treating RN: Jan 19, 1961 (62 y.o.  Yates Decamp Primary Care Shontae Rosiles: Hoy Register Other Clinician: Referring Defne Gerling: Treating Merial Moritz/Extender: Camillo Flaming Weeks in Treatment: 0 Fall Risk Assessment Items Have you had 2 or more falls in the last 12 monthso 0 No Have you had any fall that resulted in injury in the last 12 monthso 0 No FALLS RISK SCREEN Dillon Mcgee, Dillon Mcgee ([id number]) [phone] [id number] Page 3 of 4 History of falling - immediate or within 3 months 0 No Secondary diagnosis (Do you have 2 or more medical diagnoseso) 0 No Ambulatory aid None/bed rest/wheelchair/nurse 0 No Crutches/cane/walker 0 No Furniture 0 No Intravenous therapy Access/Saline/Heparin Lock 0 No Gait/Transferring Normal/ bed  rest/ wheelchair 0 No Weak (short steps with or without shuffle, stooped but able to lift head while walking, may seek 0 No support from furniture) Impaired (short steps with shuffle, may have difficulty arising from chair, head down, impaired 0 No balance) Mental Status Oriented to own ability 0 No Electronic Signature(s) Signed: 12/17/2022 4:07:14 PM By: Brenton Grills Entered By: Brenton Grills on 12/17/2022 08:01:04 -------------------------------------------------------------------------------- Foot Assessment Details Patient Name: Date of Service: Dillon Mcgee, Dillon Mcgee 12/17/2022 8:00 A M Medical Record Number: [id number] Patient Account Number: [id number] Date of Birth/Sex: Treating RN: 1960/03/30 (62 y.o. Yates Decamp Primary Care Adilson Grafton: Hoy Register Other Clinician: Referring Davionna Blacksher: Treating Shequita Peplinski/Extender: Camillo Flaming Weeks in Treatment: 0 Foot Assessment Items Site Locations + = Sensation present, - = Sensation absent, C = Callus, U = Ulcer R = Redness, W = Warmth, M = Maceration, PU = Pre-ulcerative lesion F = Fissure, S = Swelling, D = Dryness Assessment Right: Left: Other Deformity: No No Prior Foot  Ulcer: No No Prior Amputation: No No Charcot Joint: No No Ambulatory Status: Ambulatory Without Help GaitTERELLE, Dillon Mcgee ([id number]) [phone] [id number] Page 4 of 4 Electronic Signature(s) Signed: 12/17/2022 4:07:14 PM By: Brenton Grills Entered By: Brenton Grills on 12/17/2022 08:05:04 -------------------------------------------------------------------------------- Nutrition Risk Screening Details Patient Name: Date of Service: Dillon Mcgee, Dillon Mcgee 12/17/2022 8:00 A M Medical Record Number: [id number] Patient Account Number: [id number] Date of Birth/Sex: Treating RN: [birth year] (62 y.o. Yates Decamp Primary Care Decklyn Hyder: Hoy Register Other Clinician: Referring Sahara Fujimoto: Treating Valeriano Bain/Extender: Camillo Flaming Weeks in Treatment: 0 Height (in): 75 Weight (lbs): 360 Body Mass Index (BMI): 45 Nutrition Risk Screening Items Score Screening NUTRITION RISK SCREEN: I have an illness or condition that made me change the kind and/or amount of food I eat 0 No I eat fewer than two meals per day 0 No I eat few fruits and vegetables, or milk products 0 No I have three or more drinks of beer, liquor or wine almost every day 0 No I have tooth or mouth problems that make it hard for me to eat 0 No I don't always have enough money to buy the food I need 0 No I eat alone most of the time 0 No I take three or more different prescribed or over-the-counter drugs a day 0 No Without wanting to, I have lost or gained 10 pounds in the last six months 0 No I am not always physically able to shop, cook and/or feed myself 0 No Nutrition Protocols Good Risk Protocol Moderate Risk Protocol High Risk Proctocol Risk Level: Good Risk Score: 0 Electronic Signature(s) Signed: 12/17/2022 4:07:14 PM By: Brenton Grills Entered By: Brenton Grills on 12/17/2022 08:01:12

## 2022-12-17 NOTE — Progress Notes (Signed)
DOW, BLAHNIK ([id number]) [id number] Page 1 of 18 Visit Report for 12/17/2022 Allergy List Details Patient Name: Date of Service: Dillon Mcgee, Dillon Mcgee 12/17/2022 8:00 A M Medical Record Number: [id number] Patient Account Number: [id number] Date of Birth/Sex: Treating RN: [birth year] (62 y.o. Yates Decamp Primary Care Makiyla Linch: Hoy Register Other Clinician: Referring Rekia Kujala: Treating Adi Doro/Extender: Whitney Muse, Odette Horns Weeks in Treatment: 0 Allergies Active Allergies lisinopril pioglitazone Allergy Notes Electronic Signature(s) Signed: 12/17/2022 4:07:14 PM By: Brenton Grills Entered By: Brenton Grills on 12/17/2022 07:57:24 -------------------------------------------------------------------------------- Arrival Information Details Patient Name: Date of Service: Dillon Mcgee, Dillon Mcgee 12/17/2022 8:00 A M Medical Record Number: [id number] Patient Account Number: [id number] Date of Birth/Sex: Treating RN: 07-Nov-1960 (62 y.o. Yates Decamp Primary Care Nichelle Renwick: Hoy Register Other Clinician: Referring Aqib Lough: Treating Madden Piazza/Extender: Camillo Flaming Weeks in Treatment: 0 Visit Information Patient Arrived: Ambulatory Arrival Time: 07:52 Accompanied By: self Transfer Assistance: None Electronic Signature(s) Signed: 12/17/2022 4:07:14 PM By: Brenton Grills Entered By: Brenton Grills on 12/17/2022 07:55:20 -------------------------------------------------------------------------------- Clinic Level of Care Assessment Details Patient Name: Date of Service: Dillon Mcgee, Dillon Mcgee 12/17/2022 8:00 A M Medical Record Number: [id number] Patient Account Number: [id number] Date of Birth/Sex: Treating RN: 1960/10/04 (62 y.o. Yates Decamp Primary Care Pius Byrom: Hoy Register Other Clinician: [name] ([id number]) [id number] Page 2 of 18 Referring Trella Thurmond: Treating  Joel Mericle/Extender: Camillo Flaming Weeks in Treatment: 0 Clinic Level of Care Assessment Items TOOL 1 Quantity Score X- 1 0 Use when EandM and Procedure is performed on INITIAL visit ASSESSMENTS - Nursing Assessment / Reassessment X- 1 20 General Physical Exam (combine w/ comprehensive assessment (listed just below) when performed on new pt. evals) X- 1 25 Comprehensive Assessment (HX, ROS, Risk Assessments, Wounds Hx, etc.) ASSESSMENTS - Wound and Skin Assessment / Reassessment X- 1 10 Dermatologic / Skin Assessment (not related to wound area) ASSESSMENTS - Ostomy and/or Continence Assessment and Care []  - 0 Incontinence Assessment and Management []  - 0 Ostomy Care Assessment and Management (repouching, etc.) PROCESS - Coordination of Care []  - 0 Simple Patient / Family Education for ongoing care X- 1 20 Complex (extensive) Patient / Family Education for ongoing care X- 1 10 Staff obtains Chiropractor, Records, T Results / Process Orders est []  - 0 Staff telephones HHA, Nursing Homes / Clarify orders / etc []  - 0 Routine Transfer to another Facility (non-emergent condition) []  - 0 Routine Hospital Admission (non-emergent condition) X- 1 15 New Admissions / Manufacturing engineer / Ordering NPWT Apligraf, etc. , []  - 0 Emergency Hospital Admission (emergent condition) PROCESS - Special Needs []  - 0 Pediatric / Minor Patient Management []  - 0 Isolation Patient Management []  - 0 Hearing / Language / Visual special needs []  - 0 Assessment of Community assistance (transportation, D/C planning, etc.) []  - 0 Additional assistance / Altered mentation []  - 0 Support Surface(s) Assessment (bed, cushion, seat, etc.) INTERVENTIONS - Miscellaneous []  - 0 External ear exam []  - 0 Patient Transfer (multiple staff / Nurse, adult / Similar devices) []  - 0 Simple Staple / Suture removal (25 or less) []  - 0 Complex Staple / Suture removal (26 or more) []  -  0 Hypo/Hyperglycemic Management (do not check if billed separately) X- 1 15 Ankle / Brachial Index (ABI) - do not check if billed separately Has the patient been seen at the hospital within the last three years: Yes Total Score: 115 Level Of Care: New/Established - Level 3 Electronic Signature(s) Signed: 12/17/2022 4:07:14 PM By: Brenton Grills Entered By:  Brenton Grills on 12/17/2022 08:59:26 Compression Therapy Details -------------------------------------------------------------------------------- [name] ([id number]) [id number] Page 3 of 18 Patient Name: Date of Service: Dillon Mcgee, Dillon Mcgee 12/17/2022 8:00 A M Medical Record Number: [id number] Patient Account Number: [id number] Date of Birth/Sex: Treating RN: [birth year] (62 y.o. Yates Decamp Primary Care Frannie Shedrick: Hoy Register Other Clinician: Referring Karly Pitter: Treating Zykerria Tanton/Extender: Camillo Flaming Weeks in Treatment: 0 Compression Therapy Performed for Wound Assessment: Wound #1 Left,Medial,Anterior Lower Leg Performed By: Leighton Parody, RN Compression Type: Three Layer Post Procedure Diagnosis Same as Pre-procedure Electronic Signature(s) Signed: 12/17/2022 4:07:14 PM By: Brenton Grills Entered By: Brenton Grills on 12/17/2022 09:25:46 -------------------------------------------------------------------------------- Compression Therapy Details Patient Name: Date of Service: Dillon Mcgee, Dillon Mcgee 12/17/2022 8:00 A M Medical Record Number: [id number] Patient Account Number: [id number] Date of Birth/Sex: Treating RN: 05-May-1960 (62 y.o. Yates Decamp Primary Care Tosca Pletz: Hoy Register Other Clinician: Referring Aaban Griep: Treating Laquida Cotrell/Extender: Camillo Flaming Weeks in Treatment: 0 Compression Therapy Performed for Wound Assessment: Wound #2 Left,Lateral Lower Leg Performed By: Clinician Brenton Grills, RN Compression Type:  Three Layer Post Procedure Diagnosis Same as Pre-procedure Electronic Signature(s) Signed: 12/17/2022 4:07:14 PM By: Brenton Grills Entered By: Brenton Grills on 12/17/2022 09:25:46 -------------------------------------------------------------------------------- Compression Therapy Details Patient Name: Date of Service: Dillon Mcgee, Dillon Mcgee 12/17/2022 8:00 A M Medical Record Number: [id number] Patient Account Number: [id number] Date of Birth/Sex: Treating RN: 03/21/1960 (62 y.o. Yates Decamp Primary Care Bradan Congrove: Hoy Register Other Clinician: Referring Madex Seals: Treating Tamme Mozingo/Extender: Camillo Flaming Weeks in Treatment: 0 Compression Therapy Performed for Wound Assessment: Wound #3 Left,Proximal,Medial,Anterior Lower Leg Performed By: Clinician Brenton Grills, RN Compression Type: Three Layer Post Procedure Diagnosis Same as Pre-procedure Electronic Signature(s) Signed: 12/17/2022 4:07:14 PM By: Brenton Grills Entered By: Brenton Grills on 12/17/2022 09:25:46 [name] ([id number]) [id number] Page 4 of 18 -------------------------------------------------------------------------------- Compression Therapy Details Patient Name: Date of Service: Dillon Mcgee, Dillon Mcgee 12/17/2022 8:00 A M Medical Record Number: [id number] Patient Account Number: [id number] Date of Birth/Sex: Treating RN: 09-17-1960 (62 y.o. Yates Decamp Primary Care Nela Bascom: Hoy Register Other Clinician: Referring Aleatha Taite: Treating Chinenye Katzenberger/Extender: Camillo Flaming Weeks in Treatment: 0 Compression Therapy Performed for Wound Assessment: Wound #4 Left,Proximal,Lateral Lower Leg Performed By: Clinician Brenton Grills, RN Compression Type: Three Layer Post Procedure Diagnosis Same as Pre-procedure Electronic Signature(s) Signed: 12/17/2022 4:07:14 PM By: Brenton Grills Entered By: Brenton Grills on 12/17/2022  09:25:46 -------------------------------------------------------------------------------- Compression Therapy Details Patient Name: Date of Service: Dillon Mcgee, Dillon Mcgee 12/17/2022 8:00 A M Medical Record Number: [id number] Patient Account Number: [id number] Date of Birth/Sex: Treating RN: 1960-10-14 (62 y.o. Yates Decamp Primary Care Armaan Pond: Hoy Register Other Clinician: Referring Dawid Dupriest: Treating Esaias Cleavenger/Extender: Camillo Flaming Weeks in Treatment: 0 Compression Therapy Performed for Wound Assessment: Wound #5 Right,Medial,Anterior Lower Leg Performed By: Leighton Parody, RN Compression Type: Three Layer Post Procedure Diagnosis Same as Pre-procedure Electronic Signature(s) Signed: 12/17/2022 4:07:14 PM By: Brenton Grills Entered By: Brenton Grills on 12/17/2022 09:25:46 -------------------------------------------------------------------------------- Encounter Discharge Information Details Patient Name: Date of Service: Dillon Mcgee, Dillon Mcgee 12/17/2022 8:00 A M Medical Record Number: [id number] Patient Account Number: [id number] Date of Birth/Sex: Treating RN: Jun 16, 1960 (62 y.o. Yates Decamp Primary Care Jaquille Kau: Hoy Register Other Clinician: Referring Leyli Kevorkian: Treating Ruffus Kamaka/Extender: Camillo Flaming Weeks in Treatment: 0 Encounter Discharge Information Items Post Procedure Vitals Discharge Condition: Stable Temperature (F): 97.9 Jakubiak, Ritter ([id number]) [phone].pdf Page 5 of 18 Ambulatory Status: Ambulatory Pulse (bpm): 88 Discharge Destination: Home Respiratory Rate (breaths/min): 18 Transportation: Private Auto  Blood Pressure (mmHg): 122/78 Accompanied By: self Schedule Follow-up Appointment: Yes Clinical Summary of Care: Patient Declined Electronic Signature(s) Signed: 12/17/2022 4:07:14 PM By: Brenton Grills Entered By: Brenton Grills on 12/17/2022  09:17:57 -------------------------------------------------------------------------------- Lower Extremity Assessment Details Patient Name: Date of Service: Dillon Mcgee, Dillon Mcgee Mcgee 12/17/2022 8:00 A M Medical Record Number: [id number] Patient Account Number: [id number] Date of Birth/Sex: Treating RN: March 25, 1960 (62 y.o. Yates Decamp Primary Care Hunt Zajicek: Hoy Register Other Clinician: Referring Naleigha Raimondi: Treating Noor Vidales/Extender: Camillo Flaming Weeks in Treatment: 0 Edema Assessment Assessed: [Left: No] [Right: No] [Left: Edema] [Right: :] Calf Left: Right: Point of Measurement: From Medial Instep 48.6 cm 45 cm Ankle Left: Right: Point of Measurement: From Medial Instep 29.9 cm 31.2 cm Knee To Floor Left: Right: From Medial Instep 54 cm 54 cm Vascular Assessment Pulses: Dorsalis Pedis Palpable: [Left:Yes] [Right:Yes] Extremity colors, hair growth, and conditions: Extremity Color: [Left:Normal] [Right:Normal] Hair Growth on Extremity: [Left:Yes] [Right:Yes] Temperature of Extremity: [Left:Warm] [Right:Warm] Capillary Refill: [Left:< 3 seconds] [Right:< 3 seconds] Dependent Rubor: [Left:No] [Right:No] Blanched when Elevated: [Left:No] [Right:No] Lipodermatosclerosis: [Left:No] [Right:No] Blood Pressure: Brachial: [Left:122] [Right:122] Ankle: [Left:Dorsalis Pedis: 125 1.02] [Right:Dorsalis Pedis: 130 1.07] Toe Nail Assessment Left: Right: Thick: Yes Yes Discolored: Yes Yes Deformed: Yes Yes Improper Length and Hygiene: Yes Yes Electronic Signature(s) Signed: 12/17/2022 4:07:14 PM By: Tacy Dura, Leonette Most ([id number]) PM By: Brenton Grills ([phone]) [id number].pdf Page 6 of 18 Signed: 12/17/2022 4:07:14 Entered By: Brenton Grills on 12/17/2022 08:14:14 -------------------------------------------------------------------------------- Multi Wound Chart Details Patient Name: Date of Service: SULLIVAN, JACUINDE Mcgee 12/17/2022 8:00 A  M Medical Record Number: [id number] Patient Account Number: [id number] Date of Birth/Sex: Treating RN: [birth year] (62 y.o. M) Primary Care Annamaria Salah: Hoy Register Other Clinician: Referring Chrishonda Hesch: Treating Caterra Ostroff/Extender: Whitney Muse, [name] Weeks in Treatment: 0 Vital Signs Height(in): 75 Capillary Blood Glucose(mg/dl): 65 Weight(lbs): 242 Pulse(bpm): 63 Body Mass Index(BMI): 45 Blood Pressure(mmHg): 122/74 Temperature(F): 97.6 Respiratory Rate(breaths/min): 18 [1:Photos: No Photos Left, Medial, Anterior Lower Leg Wound Location: Blister Wounding Event: Venous Leg Ulcer Primary Etiology: Hypertension, Type II Diabetes Comorbid History: 09/01/2022 Date Acquired: 0 Weeks of Treatment: Open Wound Status: No Wound Recurrence:  0.9x1.1x0.1 Measurements L x W x D (cm) 0.778 A (cm) : rea 0.078 Volume (cm) : Full Thickness Without Exposed Classification: Support Structures Medium Exudate A mount: Serosanguineous Exudate Type: red, brown Exudate Color: Distinct, outline attached  Wound Margin: Medium (34-66%) Granulation A mount: Red, Pink Granulation Quality: Medium (34-66%) Necrotic A mount: Eschar Necrotic Tissue: Fat Layer (Subcutaneous Tissue): Yes Fat Layer (Subcutaneous Tissue): Yes Fat Layer (Subcutaneous Tissue): Yes  Exposed Structures: N/A Epithelialization: Debridement - Selective/Open Wound Debridement - Selective/Open Wound Debridement - Selective/Open Wound Debridement: Pre-procedure Verification/Time Out 08:45 Taken: Lidocaine 4% Topical Solution Pain Control:  Necrotic/Eschar, Slough Tissue Debrided: Non-Viable Tissue Level: 0.78 Debridement A (sq cm): rea Curette Instrument: Minimum Bleeding: Pressure Hemostasis A chieved: Procedure was tolerated well Debridement Treatment Response: 0.9x1.1x0.1 Post  Debridement Measurements L x W x D (cm) 0.078 Post Debridement Volume: (cm) Scarring: Yes Periwound Skin Texture: Dry/Scaly: Yes Periwound Skin Moisture: Hemosiderin  Staining: Yes Periwound Skin Color: N/A Temperature: Debridement Procedures Performed:]  [2:No Photos Left, Lateral Lower Leg Blister Venous Leg Ulcer Hypertension, Type II Diabetes 09/01/2022 0 Open No 7x0.8x0.1 4.398 0.44 Full Thickness Without Exposed Support Structures Medium Serosanguineous red, brown Distinct, outline attached Medium  (34-66%) Red, Pink Medium (34-66%) Eschar Medium (34-66%) 08:45 Lidocaine 4% Topical Solution Necrotic/Eschar, Slough Non-Viable Tissue 1.88 Curette Minimum Pressure Procedure was tolerated well 3x0.8x0.1  0.188 Scarring: Yes Dry/Scaly: No Hemosiderin  Staining: Yes No Abnormality Debridement] [3:No Photos Left, Proximal, Medial, Anterior Lower Leg Blister Venous Leg Ulcer Hypertension, Type II Diabetes 12/15/2022 0 Open No 2x1x0.1 1.571 0.157 Full Thickness Without Exposed Support Structures Medium  Serosanguineous red, brown Distinct, outline attached Medium (34-66%) Red, Pink Medium (34-66%) Eschar, Adherent Slough N/A 08:45 Lidocaine 4% Topical Solution Necrotic/Eschar, Slough Non-Viable Tissue 1.57 Curette Minimum Pressure Procedure was  tolerated well 2x1x0.1 0.157 Scarring: Yes Dry/Scaly: Yes Hemosiderin Staining: Yes No Abnormality Debridement] Wound Number: [id number] N/A Photos: No Photos No Photos N/A Left, Proximal, Lateral Lower Leg Right, Medial, Anterior Lower Leg N/A Wound Location: Blister Blister N/A Wounding Event: Venous Leg Ulcer Venous Leg Ulcer N/A Primary Etiology: Hypertension, Type II Diabetes Hypertension, Type II Diabetes N/A Comorbid History: 09/01/2022 09/01/2022 N/A Date Acquired: SUKHMAN, MARTINE ([id number]) [id number] Page 7 of 18 0 0 N/A Weeks of Treatment: Open Open N/A Wound Status: No No N/A Wound Recurrence: 0.6x0.6x0.1 2.2x0.7x0.1 N/A Measurements L x W x D (cm) 0.283 1.21 N/A A (cm) : rea 0.028 0.121 N/A Volume (cm) : Full Thickness Without Exposed Full Thickness Without Exposed  N/A Classification: Support Structures Support Structures Medium Medium N/A Exudate A mount: Serosanguineous Serosanguineous N/A Exudate Type: red, brown red, brown N/A Exudate Color: Distinct, outline attached Distinct, outline attached N/A Wound Margin: Medium (34-66%) Medium (34-66%) N/A Granulation A mount: Red, Pink Red, Pink N/A Granulation Quality: Medium (34-66%) Medium (34-66%) N/A Necrotic A mount: Eschar, Adherent Slough Eschar, Adherent Slough N/A Necrotic Tissue: Fat Layer (Subcutaneous Tissue): Yes Fat Layer (Subcutaneous Tissue): Yes N/A Exposed Structures: Medium (34-66%) Medium (34-66%) N/A Epithelialization: Debridement - Selective/Open Wound Debridement - Selective/Open Wound N/A Debridement: Pre-procedure Verification/Time Out 08:45 08:45 N/A Taken: Lidocaine 4% Topical Solution Lidocaine 4% Topical Solution N/A Pain Control: Necrotic/Eschar, Ambulance person, Slough N/A Tissue Debrided: Non-Viable Tissue Non-Viable Tissue N/A Level: 0.28 1.21 N/A Debridement A (sq cm): rea Curette Curette N/A Instrument: Minimum Minimum N/A Bleeding: Pressure Pressure N/A Hemostasis A chieved: Procedure was tolerated well Procedure was tolerated well N/A Debridement Treatment Response: 0.6x0.6x0.1 2.2x0.7x0.1 N/A Post Debridement Measurements L x W x D (cm) 0.028 0.121 N/A Post Debridement Volume: (cm) Scarring: Yes Scarring: Yes N/A Periwound Skin Texture: Dry/Scaly: Yes Dry/Scaly: Yes N/A Periwound Skin Moisture: Hemosiderin Staining: Yes Hemosiderin Staining: Yes N/A Periwound Skin Color: No Abnormality No Abnormality N/A Temperature: Debridement Debridement N/A Procedures Performed: Treatment Notes Wound #1 (Lower Leg) Wound Laterality: Left, Medial, Anterior Cleanser Soap and Water Discharge Instruction: May shower and wash wound with dial antibacterial soap and water prior to dressing change. Peri-Wound Care Sween Lotion  (Moisturizing lotion) Discharge Instruction: Apply moisturizing lotion as directed Topical Primary Dressing Maxorb Extra Ag+ Alginate Dressing, 4x4.75 (in/in) Discharge Instruction: Apply to wound bed as instructed Secondary Dressing ABD Pad, 8x10 Discharge Instruction: Apply over primary dressing as directed. Woven Gauze Sponge, Non-Sterile 4x4 in Discharge Instruction: Apply over primary dressing as directed. Secured With Compression Wrap Urgo K2 Lite, (equivalent to a 3 layer) two layer compression system, regular Discharge Instruction: Apply Urgo K2 Lite as directed (alternative to 3 layer compression). Compression Stockings Add-Ons Wound #2 (Lower Leg) Wound Laterality: Left, Lateral Cleanser Soap and Water Discharge Instruction: May shower and wash wound with dial antibacterial soap and water prior to dressing change. Peri-Wound Care JERZY, ROEPKE ([id number]) [id number] Page 8 of 18 Sween Lotion (Moisturizing lotion) Discharge Instruction: Apply moisturizing lotion as directed Topical Primary Dressing Maxorb Extra Ag+ Alginate Dressing, 4x4.75 (in/in) Discharge Instruction:  Apply to wound bed as instructed Secondary Dressing ABD Pad, 8x10 Discharge Instruction: Apply over primary dressing as directed. Woven Gauze Sponge, Non-Sterile 4x4 in Discharge Instruction: Apply over primary dressing as directed. Secured With Compression Wrap Urgo K2 Lite, (equivalent to a 3 layer) two layer compression system, regular Discharge Instruction: Apply Urgo K2 Lite as directed (alternative to 3 layer compression). Compression Stockings Add-Ons Wound #3 (Lower Leg) Wound Laterality: Left, Medial, Anterior, Proximal Cleanser Soap and Water Discharge Instruction: May shower and wash wound with dial antibacterial soap and water prior to dressing change. Peri-Wound Care Sween Lotion (Moisturizing lotion) Discharge Instruction: Apply moisturizing lotion as  directed Topical Primary Dressing Maxorb Extra Ag+ Alginate Dressing, 4x4.75 (in/in) Discharge Instruction: Apply to wound bed as instructed Secondary Dressing ABD Pad, 8x10 Discharge Instruction: Apply over primary dressing as directed. Woven Gauze Sponge, Non-Sterile 4x4 in Discharge Instruction: Apply over primary dressing as directed. Secured With Compression Wrap Urgo K2 Lite, (equivalent to a 3 layer) two layer compression system, regular Discharge Instruction: Apply Urgo K2 Lite as directed (alternative to 3 layer compression). Compression Stockings Add-Ons Wound #4 (Lower Leg) Wound Laterality: Left, Lateral, Proximal Cleanser Soap and Water Discharge Instruction: May shower and wash wound with dial antibacterial soap and water prior to dressing change. Peri-Wound Care Sween Lotion (Moisturizing lotion) Discharge Instruction: Apply moisturizing lotion as directed Topical Primary Dressing Maxorb Extra Ag+ Alginate Dressing, 4x4.75 (in/in) Discharge Instruction: Apply to wound bed as instructed Secondary Dressing ABD Pad, 8x10 TAYVIN, PRESLAR ([id number]) [id number] Page 9 of 18 Discharge Instruction: Apply over primary dressing as directed. Woven Gauze Sponge, Non-Sterile 4x4 in Discharge Instruction: Apply over primary dressing as directed. Secured With Compression Wrap Urgo K2 Lite, (equivalent to a 3 layer) two layer compression system, regular Discharge Instruction: Apply Urgo K2 Lite as directed (alternative to 3 layer compression). Compression Stockings Add-Ons Wound #5 (Lower Leg) Wound Laterality: Right, Medial, Anterior Cleanser Soap and Water Discharge Instruction: May shower and wash wound with dial antibacterial soap and water prior to dressing change. Peri-Wound Care Sween Lotion (Moisturizing lotion) Discharge Instruction: Apply moisturizing lotion as directed Topical Primary Dressing Maxorb Extra Ag+ Alginate Dressing,  4x4.75 (in/in) Discharge Instruction: Apply to wound bed as instructed Secondary Dressing ABD Pad, 8x10 Discharge Instruction: Apply over primary dressing as directed. Woven Gauze Sponge, Non-Sterile 4x4 in Discharge Instruction: Apply over primary dressing as directed. Secured With Compression Wrap Urgo K2 Lite, (equivalent to a 3 layer) two layer compression system, regular Discharge Instruction: Apply Urgo K2 Lite as directed (alternative to 3 layer compression). Compression Stockings Add-Ons Electronic Signature(s) Signed: 12/17/2022 9:20:47 AM By: Duanne Guess MD FACS Entered By: Duanne Guess on 12/17/2022 09:20:47 -------------------------------------------------------------------------------- Multi-Disciplinary Care Plan Details Patient Name: Date of Service: TRESTON, COKER Mcgee 12/17/2022 8:00 A M Medical Record Number: [id number] Patient Account Number: [id number] Date of Birth/Sex: Treating RN: May 27, 1960 (61 y.o. Yates Decamp Primary Care Keyunna Coco: Hoy Register Other Clinician: Referring Moua Rasmusson: Treating Leone Mobley/Extender: Camillo Flaming Weeks in Treatment: 0 Active Inactive Wound/Skin Impairment Marcellus, Leonette Most ([id number]) [id number] Page 10 of 18 Nursing Diagnoses: Knowledge deficit related to ulceration/compromised skin integrity Goals: Patient/caregiver will verbalize understanding of skin care regimen Date Initiated: 12/17/2022 Target Resolution Date: 01/31/2023 Goal Status: Active Interventions: Assess patient/caregiver ability to obtain necessary supplies Assess patient/caregiver ability to perform ulcer/skin care regimen upon admission and as needed Assess ulceration(s) every visit Provide education on ulcer and skin care Screen for HBO Treatment Activities: Skin care regimen initiated : 12/17/2022 Topical wound management initiated :  12/17/2022 Notes: Electronic Signature(s) Signed:  12/17/2022 4:07:14 PM By: Brenton Grills Entered By: Brenton Grills on 12/17/2022 08:36:44 -------------------------------------------------------------------------------- Pain Assessment Details Patient Name: Date of Service: JOHANNA, STAFFORD Mcgee 12/17/2022 8:00 A M Medical Record Number: [id number] Patient Account Number: [id number] Date of Birth/Sex: Treating RN: 05-19-1960 (62 y.o. Yates Decamp Primary Care Anelise Staron: Hoy Register Other Clinician: Referring Alis Sawchuk: Treating Chamaine Stankus/Extender: Camillo Flaming Weeks in Treatment: 0 Active Problems Location of Pain Severity and Description of Pain Patient Has Paino No Site Locations Pain Management and Medication Current Pain Management: Electronic Signature(s) Signed: 12/17/2022 4:07:14 PM By: Brenton Grills Entered By: Brenton Grills on 12/17/2022 08:34:46 Winrow, Leonette Most ([id number]) [id number] Page 11 of 18 -------------------------------------------------------------------------------- Patient/Caregiver Education Details Patient Name: Date of Service: KANNEN, MOXEY Mcgee 10/16/2024andnbsp8:00 A M Medical Record Number: [id number] Patient Account Number: [id number] Date of Birth/Gender: Treating RN: 1960-06-13 (62 y.o. Yates [facility] Primary Care Physician: Hoy Register Other Clinician: Referring Physician: Treating Physician/Extender: Camillo Flaming Weeks in Treatment: 0 Education Assessment Education Provided To: Patient Education Topics Provided Wound/Skin Impairment: Methods: Explain/Verbal Responses: State content correctly Electronic Signature(s) Signed: 12/17/2022 4:07:14 PM By: Brenton Grills Entered By: Brenton Grills on 12/17/2022 08:36:55 -------------------------------------------------------------------------------- Wound Assessment Details Patient Name: Date of Service: CORNELLIUS, KROPP Mcgee 12/17/2022 8:00 A M Medical Record Number:  [id number] Patient Account Number: [id number] Date of Birth/Sex: Treating RN: [birth year] (62 y.o. Yates [facility] Primary Care Nainoa Woldt: Hoy Register Other Clinician: Referring Camarion Weier: Treating Jennife Zaucha/Extender: Camillo Flaming Weeks in Treatment: 0 Wound Status Wound Number: 1 Primary Etiology: Venous Leg Ulcer Wound Location: Left, Medial, Anterior Lower Leg Wound Status: Open Wounding Event: Blister Comorbid History: Hypertension, Type II Diabetes Date Acquired: 09/01/2022 Weeks Of Treatment: 0 Clustered Wound: No Wound Measurements Length: (cm) 0.9 Width: (cm) 1.1 Depth: (cm) 0.1 Area: (cm) 0.778 Volume: (cm) 0.078 % Reduction in Area: % Reduction in Volume: Tunneling: No Undermining: No Wound Description Classification: Full Thickness Without Exposed Suppor Wound Margin: Distinct, outline attached Exudate Amount: Medium Exudate Type: Serosanguineous Exudate Color: red, brown t Structures Foul Odor After Cleansing: No Slough/Fibrino Yes Wound Bed Granulation Amount: Medium (34-66%) Exposed Structure Granulation Quality: Red, Pink Fat Layer (Subcutaneous Tissue) Exposed: Yes JAYDENCE, ARNESEN ([id number]) [id number] Page 12 of 18 Granulation Quality: Red, Pink Fat Layer (Subcutaneous Tissue) Exposed: Yes Necrotic Amount: Medium (34-66%) Necrotic Quality: Eschar Periwound Skin Texture Texture Color No Abnormalities Noted: No No Abnormalities Noted: No Scarring: Yes Hemosiderin Staining: Yes Moisture No Abnormalities Noted: No Dry / Scaly: Yes Treatment Notes Wound #1 (Lower Leg) Wound Laterality: Left, Medial, Anterior Cleanser Soap and Water Discharge Instruction: May shower and wash wound with dial antibacterial soap and water prior to dressing change. Peri-Wound Care Sween Lotion (Moisturizing lotion) Discharge Instruction: Apply moisturizing lotion as directed Topical Primary Dressing Maxorb Extra Ag+  Alginate Dressing, 4x4.75 (in/in) Discharge Instruction: Apply to wound bed as instructed Secondary Dressing ABD Pad, 8x10 Discharge Instruction: Apply over primary dressing as directed. Woven Gauze Sponge, Non-Sterile 4x4 in Discharge Instruction: Apply over primary dressing as directed. Secured With Compression Wrap Urgo K2 Lite, (equivalent to a 3 layer) two layer compression system, regular Discharge Instruction: Apply Urgo K2 Lite as directed (alternative to 3 layer compression). Compression Stockings Add-Ons Electronic Signature(s) Signed: 12/17/2022 4:07:14 PM By: Brenton Grills Entered By: Brenton Grills on 12/17/2022 08:19:44 -------------------------------------------------------------------------------- Wound Assessment Details Patient Name: Date of Service: TAYVIN, PRESLAR Mcgee 12/17/2022 8:00 A M Medical Record Number: [id number] Patient Account Number: [id number] Date of Birth/Sex: Treating RN:  12/27/1960 (62 y.o. Yates [facility] Primary Care Yukari Flax: Hoy Register Other Clinician: Referring Anorah Trias: Treating Alana Dayton/Extender: Camillo Flaming Weeks in Treatment: 0 Wound Status Wound Number: 2 Primary Etiology: Venous Leg Ulcer Wound Location: Left, Lateral Lower Leg Wound Status: Open Wounding Event: Blister Comorbid History: Hypertension, Type II Diabetes Date Acquired: 09/01/2022 Weeks Of Treatment: 0 Clustered Wound: No ROMIE, TAY ([id number]) [id number] Page 13 of 18 Wound Measurements Length: (cm) 7 Width: (cm) 0.8 Depth: (cm) 0.1 Area: (cm) 4.398 Volume: (cm) 0.44 % Reduction in Area: % Reduction in Volume: Epithelialization: Medium (34-66%) Tunneling: No Wound Description Classification: Full Thickness Without Exposed Suppor Wound Margin: Distinct, outline attached Exudate Amount: Medium Exudate Type: Serosanguineous Exudate Color: red, brown t Structures Foul Odor After Cleansing:  No Slough/Fibrino Yes Wound Bed Granulation Amount: Medium (34-66%) Exposed Structure Granulation Quality: Red, Pink Fat Layer (Subcutaneous Tissue) Exposed: Yes Necrotic Amount: Medium (34-66%) Necrotic Quality: Eschar Periwound Skin Texture Texture Color No Abnormalities Noted: No No Abnormalities Noted: No Scarring: Yes Hemosiderin Staining: Yes Moisture Temperature / Pain No Abnormalities Noted: No Temperature: No Abnormality Dry / Scaly: No Treatment Notes Wound #2 (Lower Leg) Wound Laterality: Left, Lateral Cleanser Soap and Water Discharge Instruction: May shower and wash wound with dial antibacterial soap and water prior to dressing change. Peri-Wound Care Sween Lotion (Moisturizing lotion) Discharge Instruction: Apply moisturizing lotion as directed Topical Primary Dressing Maxorb Extra Ag+ Alginate Dressing, 4x4.75 (in/in) Discharge Instruction: Apply to wound bed as instructed Secondary Dressing ABD Pad, 8x10 Discharge Instruction: Apply over primary dressing as directed. Woven Gauze Sponge, Non-Sterile 4x4 in Discharge Instruction: Apply over primary dressing as directed. Secured With Compression Wrap Urgo K2 Lite, (equivalent to a 3 layer) two layer compression system, regular Discharge Instruction: Apply Urgo K2 Lite as directed (alternative to 3 layer compression). Compression Stockings Add-Ons Electronic Signature(s) Signed: 12/17/2022 4:07:14 PM By: Brenton Grills Entered By: Brenton Grills on 12/17/2022 08:23:00 [name] ([id number]) [id number] Page 14 of 18 -------------------------------------------------------------------------------- Wound Assessment Details Patient Name: Date of Service: AYDON, SWAMY Mcgee 12/17/2022 8:00 A M Medical Record Number: [id number] Patient Account Number: [id number] Date of Birth/Sex: Treating RN: 11-23-1960 (62 y.o. Yates [facility] Primary Care [place] Virrueta: Hoy Register Other  Clinician: Referring Berdie Malter: Treating Guillermo Nehring/Extender: Camillo Flaming Weeks in Treatment: 0 Wound Status Wound Number: 3 Primary Etiology: Venous Leg Ulcer Wound Location: Left, Proximal, Medial, Anterior Lower Leg Wound Status: Open Wounding Event: Blister Comorbid History: Hypertension, Type II Diabetes Date Acquired: 12/15/2022 Weeks Of Treatment: 0 Clustered Wound: No Wound Measurements Length: (cm) 2 Width: (cm) 1 Depth: (cm) 0.1 Area: (cm) 1.571 Volume: (cm) 0.157 % Reduction in Area: % Reduction in Volume: Tunneling: No Undermining: No Wound Description Classification: Full Thickness Without Exposed Suppor Wound Margin: Distinct, outline attached Exudate Amount: Medium Exudate Type: Serosanguineous Exudate Color: red, brown t Structures Foul Odor After Cleansing: No Slough/Fibrino Yes Wound Bed Granulation Amount: Medium (34-66%) Exposed Structure Granulation Quality: Red, Pink Fat Layer (Subcutaneous Tissue) Exposed: Yes Necrotic Amount: Medium (34-66%) Necrotic Quality: Eschar, Adherent Slough Periwound Skin Texture Texture Color No Abnormalities Noted: No No Abnormalities Noted: No Scarring: Yes Hemosiderin Staining: Yes Moisture Temperature / Pain No Abnormalities Noted: No Temperature: No Abnormality Dry / Scaly: Yes Treatment Notes Wound #3 (Lower Leg) Wound Laterality: Left, Medial, Anterior, Proximal Cleanser Soap and Water Discharge Instruction: May shower and wash wound with dial antibacterial soap and water prior to dressing change. Peri-Wound Care Sween Lotion (Moisturizing lotion) Discharge Instruction: Apply moisturizing lotion as directed  Topical Primary Dressing Maxorb Extra Ag+ Alginate Dressing, 4x4.75 (in/in) Discharge Instruction: Apply to wound bed as instructed Secondary Dressing ABD Pad, 8x10 Discharge Instruction: Apply over primary dressing as directed. Woven Gauze Sponge, Non-Sterile 4x4  in Discharge Instruction: Apply over primary dressing as directed. Secured With Compression Wrap Urgo K2 Lite, (equivalent to a 3 layer) two layer compression system, regular BADEN, BETSCH ([id number]) [id number] Page 15 of 18 Discharge Instruction: Apply Urgo K2 Lite as directed (alternative to 3 layer compression). Compression Stockings Add-Ons Electronic Signature(s) Signed: 12/17/2022 4:07:14 PM By: Brenton Grills Entered By: Brenton Grills on 12/17/2022 08:25:39 -------------------------------------------------------------------------------- Wound Assessment Details Patient Name: Date of Service: MALCOMB, GANGEMI Mcgee 12/17/2022 8:00 A M Medical Record Number: [id number] Patient Account Number: [id number] Date of Birth/Sex: Treating RN: 1960-07-03 (62 y.o. Yates [facility] Primary Care Harvey Lingo: Hoy Register Other Clinician: Referring Yuleidy Rappleye: Treating Lucian Baswell/Extender: Camillo Flaming Weeks in Treatment: 0 Wound Status Wound Number: 4 Primary Etiology: Venous Leg Ulcer Wound Location: Left, Proximal, Lateral Lower Leg Wound Status: Open Wounding Event: Blister Comorbid History: Hypertension, Type II Diabetes Date Acquired: 09/01/2022 Weeks Of Treatment: 0 Clustered Wound: No Wound Measurements Length: (cm) 0.6 Width: (cm) 0.6 Depth: (cm) 0.1 Area: (cm) 0.283 Volume: (cm) 0.028 % Reduction in Area: % Reduction in Volume: Epithelialization: Medium (34-66%) Tunneling: No Undermining: No Wound Description Classification: Full Thickness Without Exposed Suppor Wound Margin: Distinct, outline attached Exudate Amount: Medium Exudate Type: Serosanguineous Exudate Color: red, brown t Structures Foul Odor After Cleansing: No Slough/Fibrino Yes Wound Bed Granulation Amount: Medium (34-66%) Exposed Structure Granulation Quality: Red, Pink Fat Layer (Subcutaneous Tissue) Exposed: Yes Necrotic Amount: Medium  (34-66%) Necrotic Quality: Eschar, Adherent Slough Periwound Skin Texture Texture Color No Abnormalities Noted: No No Abnormalities Noted: No Scarring: Yes Hemosiderin Staining: Yes Moisture Temperature / Pain No Abnormalities Noted: No Temperature: No Abnormality Dry / Scaly: Yes Treatment Notes Wound #4 (Lower Leg) Wound Laterality: Left, Lateral, Proximal Cleanser Soap and Water Discharge Instruction: May shower and wash wound with dial antibacterial soap and water prior to dressing change. Peri-Wound Care Sween Lotion (Moisturizing lotion) CHINO, SARDO ([id number]) [id number] Page 16 of 18 Discharge Instruction: Apply moisturizing lotion as directed Topical Primary Dressing Maxorb Extra Ag+ Alginate Dressing, 4x4.75 (in/in) Discharge Instruction: Apply to wound bed as instructed Secondary Dressing ABD Pad, 8x10 Discharge Instruction: Apply over primary dressing as directed. Woven Gauze Sponge, Non-Sterile 4x4 in Discharge Instruction: Apply over primary dressing as directed. Secured With Compression Wrap Urgo K2 Lite, (equivalent to a 3 layer) two layer compression system, regular Discharge Instruction: Apply Urgo K2 Lite as directed (alternative to 3 layer compression). Compression Stockings Add-Ons Electronic Signature(s) Signed: 12/17/2022 4:07:14 PM By: Brenton Grills Entered By: Brenton Grills on 12/17/2022 08:65:78 -------------------------------------------------------------------------------- Wound Assessment Details Patient Name: Date of Service: RASHARD, RYLE Mcgee 12/17/2022 8:00 A M Medical Record Number: [id number] Patient Account Number: [id number] Date of Birth/Sex: Treating RN: [birth year] (62 y.o. Yates [facility] Primary Care Kimani Bedoya: Hoy Register Other Clinician: Referring Sabrin Dunlevy: Treating Bertrand Vowels/Extender: Camillo Flaming Weeks in Treatment: 0 Wound Status Wound Number: 5 Primary Etiology:  Venous Leg Ulcer Wound Location: Right, Medial, Anterior Lower Leg Wound Status: Open Wounding Event: Blister Comorbid History: Hypertension, Type II Diabetes Date Acquired: 09/01/2022 Weeks Of Treatment: 0 Clustered Wound: No Wound Measurements Length: (cm) 2.2 Width: (cm) 0.7 Depth: (cm) 0.1 Area: (cm) 1.21 Volume: (cm) 0.121 % Reduction in Area: % Reduction in Volume: Epithelialization: Medium (34-66%) Tunneling: No Undermining: No Wound Description Classification: Full Thickness Without  Exposed Suppor Wound Margin: Distinct, outline attached Exudate Amount: Medium Exudate Type: Serosanguineous Exudate Color: red, brown t Structures Foul Odor After Cleansing: No Slough/Fibrino Yes Wound Bed Granulation Amount: Medium (34-66%) Exposed Structure Granulation Quality: Red, Pink Fat Layer (Subcutaneous Tissue) Exposed: Yes Necrotic Amount: Medium (34-66%) Necrotic Quality: Eschar, Adherent [address] HAYWOOD, MEINDERS ([id number]) [id number] Page 17 of 18 Texture Color No Abnormalities Noted: No No Abnormalities Noted: No Scarring: Yes Hemosiderin Staining: Yes Moisture Temperature / Pain No Abnormalities Noted: No Temperature: No Abnormality Dry / Scaly: Yes Treatment Notes Wound #5 (Lower Leg) Wound Laterality: Right, Medial, Anterior Cleanser Soap and Water Discharge Instruction: May shower and wash wound with dial antibacterial soap and water prior to dressing change. Peri-Wound Care Sween Lotion (Moisturizing lotion) Discharge Instruction: Apply moisturizing lotion as directed Topical Primary Dressing Maxorb Extra Ag+ Alginate Dressing, 4x4.75 (in/in) Discharge Instruction: Apply to wound bed as instructed Secondary Dressing ABD Pad, 8x10 Discharge Instruction: Apply over primary dressing as directed. Woven Gauze Sponge, Non-Sterile 4x4 in Discharge Instruction: Apply over primary dressing as directed. Secured  With Compression Wrap Urgo K2 Lite, (equivalent to a 3 layer) two layer compression system, regular Discharge Instruction: Apply Urgo K2 Lite as directed (alternative to 3 layer compression). Compression Stockings Add-Ons Electronic Signature(s) Signed: 12/17/2022 4:07:14 PM By: Brenton Grills Entered By: Brenton Grills on 12/17/2022 08:30:46 -------------------------------------------------------------------------------- Vitals Details Patient Name: Date of Service: NICKOLAS, CHALFIN Mcgee 12/17/2022 8:00 A M Medical Record Number: [id number] Patient Account Number: [id number] Date of Birth/Sex: Treating RN: 1960/03/09 (62 y.o. Yates [facility] Primary Care Miu Chiong: Hoy Register Other Clinician: Referring Irianna Gilday: Treating Maddie Brazier/Extender: Camillo Flaming Weeks in Treatment: 0 Vital Signs Time Taken: 08:00 Temperature (F): 97.6 Height (in): 75 Pulse (bpm): 63 Weight (lbs): 360 Respiratory Rate (breaths/min): 18 Body Mass Index (BMI): 45 Blood Pressure (mmHg): 122/74 Capillary Blood Glucose (mg/dl): 65 Reference Range: 80 - 120 mg / dl Electronic Signature(s) Signed: 12/17/2022 4:07:14 PM By: Brenton Grills Entered By: Brenton Grills on 12/17/2022 07:56:50 [name] ([id number]) [id number].pdf Page 18 of 18

## 2022-12-17 NOTE — Progress Notes (Signed)
TRIGO, WINTERBOTTOM ([id number]) [id number] Page 1 of 14 Visit Report for 12/17/2022 Chief Complaint Document Details Patient Name: Date of Service: YUVRAJ, PFEIFER RLES 12/17/2022 8:00 A M Medical Record Number: [id number] Patient Account Number: [id number] Date of Birth/Sex: Treating RN: 01/29/1961 (62 y.o. M) Primary Care Provider: Hoy Register Other Clinician: Referring Provider: Treating Provider/Extender: Camillo Flaming Weeks in Treatment: 0 Information Obtained from: Patient Chief Complaint Patients presents for treatment of open diabetic ulcers of the left and right leg in the setting of chronic venous insufficiency. Electronic Signature(s) Signed: 12/17/2022 9:21:10 AM By: Duanne Guess MD FACS Previous Signature: 12/17/2022 7:53:58 AM Version By: Duanne Guess MD FACS Previous Signature: 12/17/2022 7:51:11 AM Version By: Duanne Guess MD FACS Entered By: Duanne Guess on 12/17/2022 09:21:10 -------------------------------------------------------------------------------- Debridement Details Patient Name: Date of Service: JAYTHAN, HINELY RLES 12/17/2022 8:00 A M Medical Record Number: [id number] Patient Account Number: [id number] Date of Birth/Sex: Treating RN: [birth year] (62 y.o. Yates Decamp Primary Care Provider: Hoy Register Other Clinician: Referring Provider: Treating Provider/Extender: Camillo Flaming Weeks in Treatment: 0 Debridement Performed for Assessment: Wound #2 Left,Lateral Lower Leg Performed By: Physician Duanne Guess, MD The following information was scribed by: Brenton Grills The information was scribed for: Duanne Guess Debridement Type: Debridement Severity of Tissue Pre Debridement: Fat layer exposed Level of Consciousness (Pre-procedure): Awake and Alert Pre-procedure Verification/Time Out Yes - 08:45 Taken: Start Time: 08:46 Pain Control: Lidocaine 4% Topical  Solution Percent of Wound Bed Debrided: 100% T Area Debrided (cm): otal 1.88 Tissue and other material debrided: Non-Viable, Eschar, Slough, Slough Level: Non-Viable Tissue Debridement Description: Selective/Open Wound Instrument: Curette Bleeding: Minimum Hemostasis Achieved: Pressure Response to Treatment: Procedure was tolerated well Level of Consciousness (Post- Awake and Alert procedure): Post Debridement Measurements of Total Wound Length: (cm) 3 Width: (cm) 0.8 Depth: (cm) 0.1 Volume: (cm) 0.188 Stangl, Yong ([id number]) [phone].pdf Page 2 of 14 Character of Wound/Ulcer Post Debridement: Improved Severity of Tissue Post Debridement: Fat layer exposed Post Procedure Diagnosis Same as Pre-procedure Electronic Signature(s) Signed: 12/17/2022 9:38:21 AM By: Duanne Guess MD FACS Signed: 12/17/2022 4:07:14 PM By: Brenton Grills Entered By: Brenton Grills on 12/17/2022 08:47:16 -------------------------------------------------------------------------------- Debridement Details Patient Name: Date of Service: THURMOND, HILDEBRAN RLES 12/17/2022 8:00 A M Medical Record Number: [id number] Patient Account Number: [id number] Date of Birth/Sex: Treating RN: 09-Feb-1961 (62 y.o. Yates Decamp Primary Care Provider: Hoy Register Other Clinician: Referring Provider: Treating Provider/Extender: Camillo Flaming Weeks in Treatment: 0 Debridement Performed for Assessment: Wound #1 Left,Medial,Anterior Lower Leg Performed By: Physician Duanne Guess, MD The following information was scribed by: Brenton Grills The information was scribed for: Duanne Guess Debridement Type: Debridement Severity of Tissue Pre Debridement: Fat layer exposed Level of Consciousness (Pre-procedure): Awake and Alert Pre-procedure Verification/Time Out Yes - 08:45 Taken: Start Time: 08:46 Pain Control: Lidocaine 4% Topical Solution Percent of Wound Bed  Debrided: 100% T Area Debrided (cm): otal 0.78 Tissue and other material debrided: Non-Viable, Eschar, Slough, Slough Level: Non-Viable Tissue Debridement Description: Selective/Open Wound Instrument: Curette Bleeding: Minimum Hemostasis Achieved: Pressure Response to Treatment: Procedure was tolerated well Level of Consciousness (Post- Awake and Alert procedure): Post Debridement Measurements of Total Wound Length: (cm) 0.9 Width: (cm) 1.1 Depth: (cm) 0.1 Volume: (cm) 0.078 Character of Wound/Ulcer Post Debridement: Stable Severity of Tissue Post Debridement: Fat layer exposed Post Procedure Diagnosis Same as Pre-procedure Electronic Signature(s) Signed: 12/17/2022 9:38:21 AM By: Duanne Guess MD FACS Signed: 12/17/2022 4:07:14 PM By: Brenton Grills Entered By: Brenton Grills on 12/17/2022  08:48:25 GIORDAN, FORDHAM ([id number]) [id number] Page 3 of 14 -------------------------------------------------------------------------------- Debridement Details Patient Name: Date of Service: BABATUNDE, SEAGO RLES 12/17/2022 8:00 A M Medical Record Number: [id number] Patient Account Number: [id number] Date of Birth/Sex: Treating RN: 1960/07/19 (62 y.o. Yates Decamp Primary Care Provider: Hoy Register Other Clinician: Referring Provider: Treating Provider/Extender: Camillo Flaming Weeks in Treatment: 0 Debridement Performed for Assessment: Wound #3 Left,Proximal,Medial,Anterior Lower Leg Performed By: Physician Duanne Guess, MD The following information was scribed by: Brenton Grills The information was scribed for: Duanne Guess Debridement Type: Debridement Severity of Tissue Pre Debridement: Fat layer exposed Level of Consciousness (Pre-procedure): Awake and Alert Pre-procedure Verification/Time Out Yes - 08:45 Taken: Start Time: 08:46 Pain Control: Lidocaine 4% Topical Solution Percent of Wound Bed Debrided: 100% T  Area Debrided (cm): otal 1.57 Tissue and other material debrided: Non-Viable, Eschar, Slough, Slough Level: Non-Viable Tissue Debridement Description: Selective/Open Wound Instrument: Curette Bleeding: Minimum Hemostasis Achieved: Pressure Response to Treatment: Procedure was tolerated well Level of Consciousness (Post- Awake and Alert procedure): Post Debridement Measurements of Total Wound Length: (cm) 2 Width: (cm) 1 Depth: (cm) 0.1 Volume: (cm) 0.157 Character of Wound/Ulcer Post Debridement: Stable Severity of Tissue Post Debridement: Fat layer exposed Post Procedure Diagnosis Same as Pre-procedure Electronic Signature(s) Signed: 12/17/2022 9:38:21 AM By: Duanne Guess MD FACS Signed: 12/17/2022 4:07:14 PM By: Brenton Grills Entered By: Brenton Grills on 12/17/2022 08:49:21 -------------------------------------------------------------------------------- Debridement Details Patient Name: Date of Service: KOLTON, KIENLE RLES 12/17/2022 8:00 A M Medical Record Number: [id number] Patient Account Number: [id number] Date of Birth/Sex: Treating RN: 01-10-1961 (62 y.o. Yates Decamp Primary Care Provider: Hoy Register Other Clinician: Referring Provider: Treating Provider/Extender: Camillo Flaming Weeks in Treatment: 0 Debridement Performed for Assessment: Wound #4 Left,Proximal,Lateral Lower Leg Performed By: Physician Duanne Guess, MD The following information was scribed by: Brenton Grills The information was scribed for: Duanne Guess Debridement Type: Debridement Severity of Tissue Pre Debridement: Fat layer exposed Level of Consciousness (Pre-procedure): Awake and Alert Pre-procedure Verification/Time Out Yes - 08:45 Taken: Start Time: 08:46 Pain Control: Lidocaine 4% Topical Solution Percent of Wound Bed Debrided: 100% EULICE, RUTLEDGE ([id number]) [phone] Page 4 of 14 T Area Debrided (cm): otal  0.28 Tissue and other material debrided: Non-Viable, Eschar, Slough, Slough Level: Non-Viable Tissue Debridement Description: Selective/Open Wound Instrument: Curette Bleeding: Minimum Hemostasis Achieved: Pressure Response to Treatment: Procedure was tolerated well Level of Consciousness (Post- Awake and Alert procedure): Post Debridement Measurements of Total Wound Length: (cm) 0.6 Width: (cm) 0.6 Depth: (cm) 0.1 Volume: (cm) 0.028 Character of Wound/Ulcer Post Debridement: Stable Severity of Tissue Post Debridement: Fat layer exposed Post Procedure Diagnosis Same as Pre-procedure Electronic Signature(s) Signed: 12/17/2022 9:38:21 AM By: Duanne Guess MD FACS Signed: 12/17/2022 4:07:14 PM By: Brenton Grills Entered By: Brenton Grills on 12/17/2022 08:50:03 -------------------------------------------------------------------------------- Debridement Details Patient Name: Date of Service: SANJUAN, SAWA RLES 12/17/2022 8:00 A M Medical Record Number: [id number] Patient Account Number: [id number] Date of Birth/Sex: Treating RN: May 16, 1960 (62 y.o. Yates Decamp Primary Care Provider: Hoy Register Other Clinician: Referring Provider: Treating Provider/Extender: Camillo Flaming Weeks in Treatment: 0 Debridement Performed for Assessment: Wound #5 Right,Medial,Anterior Lower Leg Performed By: Physician Duanne Guess, MD Debridement Type: Debridement Severity of Tissue Pre Debridement: Fat layer exposed Level of Consciousness (Pre-procedure): Awake and Alert Pre-procedure Verification/Time Out Yes - 08:45 Taken: Start Time: 08:46 Pain Control: Lidocaine 4% Topical Solution Percent of Wound Bed Debrided: 100% T Area Debrided (cm): otal 1.21 Tissue and other material debrided: Non-Viable, Eschar,  Slough, [employer] Level: Non-Viable Tissue Debridement Description: Selective/Open Wound Instrument: Curette Bleeding: Minimum Hemostasis Achieved:  Pressure Response to Treatment: Procedure was tolerated well Level of Consciousness (Post- Awake and Alert procedure): Post Debridement Measurements of Total Wound Length: (cm) 2.2 Width: (cm) 0.7 Depth: (cm) 0.1 Volume: (cm) 0.121 Character of Wound/Ulcer Post Debridement: Improved Severity of Tissue Post Debridement: Fat layer exposed Post Procedure Diagnosis Same as TYREECE, GELLES ([id number]) [id number] Page 5 of 14 Electronic Signature(s) Signed: 12/17/2022 9:38:21 AM By: Duanne Guess MD FACS Signed: 12/17/2022 4:07:14 PM By: Brenton Grills Entered By: Brenton Grills on 12/17/2022 08:50:41 -------------------------------------------------------------------------------- HPI Details Patient Name: Date of Service: SUSANA, GRIPP RLES 12/17/2022 8:00 A M Medical Record Number: [id number] Patient Account Number: [id number] Date of Birth/Sex: Treating RN: 05/10/1960 (62 y.o. M) Primary Care Provider: Hoy Register Other Clinician: Referring Provider: Treating Provider/Extender: Camillo Flaming Weeks in Treatment: 0 History of Present Illness HPI Description: ADMISSION 12/17/2022 ***ABIs R: 1.07; L: 1.01*** This is a 62 year old type II diabetic (last hemoglobin A1c 7.6%) who struck his leg on the corner of his bed in mid August. He was seen at his PCPs office and given a course of antibiotics (Bactrim) due to swelling and purulent drainage from the site. A month later, the wound still had not healed and his primary care doctor referred him to wound care center for further evaluation and management. A course of doxycycline was prescribed at that time. He says he is still taking the antibiotic. Electronic Signature(s) Signed: 12/17/2022 9:22:26 AM By: Duanne Guess MD FACS Previous Signature: 12/17/2022 7:53:38 AM Version By: Duanne Guess MD FACS Entered By: Duanne Guess on 12/17/2022  09:22:26 -------------------------------------------------------------------------------- Physical Exam Details Patient Name: Date of Service: YULIAN, GOSNEY RLES 12/17/2022 8:00 A M Medical Record Number: [id number] Patient Account Number: [id number] Date of Birth/Sex: Treating RN: 01/08/1961 (62 y.o. M) Primary Care Provider: Hoy Register Other Clinician: Referring Provider: Treating Provider/Extender: Whitney Muse, [name] Weeks in Treatment: 0 Constitutional . . . . No acute distress. Respiratory Normal work of breathing on room air. Cardiovascular Changes consistent with chronic venous insufficiency. Notes 12/17/2022: He has a total of 5 wounds, 4 on the left and 1 on the right. They all have a layer of eschar on the surface. The fat layer is exposed underneath. No signs concerning for infection. Electronic Signature(s) Signed: 12/17/2022 9:24:59 AM By: Duanne Guess MD FACS Entered By: Duanne Guess on 12/17/2022 09:24:59 [name] ([id number]) [id number] Page 6 of 14 -------------------------------------------------------------------------------- Physician Orders Details Patient Name: Date of Service: SAGAN, MASELLI RLES 12/17/2022 8:00 A M Medical Record Number: [id number] Patient Account Number: [id number] Date of Birth/Sex: Treating RN: 1961/02/25 (62 y.o. Yates Decamp Primary Care Provider: Hoy Register Other Clinician: Referring Provider: Treating Provider/Extender: Camillo Flaming Weeks in Treatment: 0 The following information was scribed by: Brenton Grills The information was scribed for: Duanne Guess Verbal / Phone Orders: No Diagnosis Coding ICD-10 Coding Code Description [id number] Non-pressure chronic ulcer of other part of left lower leg with fat layer exposed L97.812 Non-pressure chronic ulcer of other part of right lower leg with fat layer exposed E11.622 Type 2 diabetes  mellitus with other skin ulcer I87.2 Venous insufficiency (chronic) (peripheral) E66.01 Morbid (severe) obesity due to excess calories Follow-up Appointments Return Appointment in 1 week. Anesthetic Wound #1 Left,Medial,Anterior Lower Leg (In clinic) Topical Lidocaine 4% applied to wound bed - In clinic Wound #2 Left,Lateral Lower Leg (In clinic) Topical Lidocaine 4% applied to wound bed - In  clinic Wound #3 Left,Proximal,Medial,Anterior Lower Leg (In clinic) Topical Lidocaine 4% applied to wound bed - In clinic Wound #4 Left,Proximal,Lateral Lower Leg (In clinic) Topical Lidocaine 4% applied to wound bed - In clinic Wound #5 Right,Medial,Anterior Lower Leg (In clinic) Topical Lidocaine 4% applied to wound bed - In clinic Bathing/ Shower/ Hygiene May shower with protection but do not get wound dressing(s) wet. Protect dressing(s) with water repellant cover (for example, large plastic bag) or a cast cover and may then take shower. - Can sponge bathe. Edema Control - Lymphedema / SCD / Other Bilateral Lower Extremities Elevate legs to the level of the heart or above for 30 minutes daily and/or when sitting for 3-4 times a day throughout the day. A void standing for long periods of time. Exercise regularly If compression wraps slide down please call wound center and speak with a nurse. Additional Orders / Instructions Follow Nutritious Diet - 70-100g of protein per day. Sources of protein include lean chicken, fish, [place], nuts, tofu, protein shakes, eggs, Greek yogurt, etc. Wound Treatment Wound #1 - Lower Leg Wound Laterality: Left, Medial, Anterior Cleanser: Soap and Water Discharge Instructions: May shower and wash wound with dial antibacterial soap and water prior to dressing change. Peri-Wound Care: Sween Lotion (Moisturizing lotion) Discharge Instructions: Apply moisturizing lotion as directed Prim Dressing: Maxorb Extra Ag+ Alginate Dressing, 4x4.75 (in/in) ary Discharge  Instructions: Apply to wound bed as instructed Secondary Dressing: ABD Pad, 8x10 Discharge Instructions: Apply over primary dressing as directed. Secondary Dressing: Woven Gauze Sponge, Non-Sterile 4x4 in [place], Leonette Most ([id number]) [id number] Page 7 of 14 Discharge Instructions: Apply over primary dressing as directed. Compression Wrap: Urgo K2 Lite, (equivalent to a 3 layer) two layer compression system, regular Discharge Instructions: Apply Urgo K2 Lite as directed (alternative to 3 layer compression). Wound #2 - Lower Leg Wound Laterality: Left, Lateral Cleanser: Soap and Water Discharge Instructions: May shower and wash wound with dial antibacterial soap and water prior to dressing change. Peri-Wound Care: Sween Lotion (Moisturizing lotion) Discharge Instructions: Apply moisturizing lotion as directed Prim Dressing: Maxorb Extra Ag+ Alginate Dressing, 4x4.75 (in/in) ary Discharge Instructions: Apply to wound bed as instructed Secondary Dressing: ABD Pad, 8x10 Discharge Instructions: Apply over primary dressing as directed. Secondary Dressing: Woven Gauze Sponge, Non-Sterile 4x4 in Discharge Instructions: Apply over primary dressing as directed. Compression Wrap: Urgo K2 Lite, (equivalent to a 3 layer) two layer compression system, regular Discharge Instructions: Apply Urgo K2 Lite as directed (alternative to 3 layer compression). Wound #3 - Lower Leg Wound Laterality: Left, Medial, Anterior, Proximal Cleanser: Soap and Water Discharge Instructions: May shower and wash wound with dial antibacterial soap and water prior to dressing change. Peri-Wound Care: Sween Lotion (Moisturizing lotion) Discharge Instructions: Apply moisturizing lotion as directed Prim Dressing: Maxorb Extra Ag+ Alginate Dressing, 4x4.75 (in/in) ary Discharge Instructions: Apply to wound bed as instructed Secondary Dressing: ABD Pad, 8x10 Discharge Instructions: Apply over primary  dressing as directed. Secondary Dressing: Woven Gauze Sponge, Non-Sterile 4x4 in Discharge Instructions: Apply over primary dressing as directed. Compression Wrap: Urgo K2 Lite, (equivalent to a 3 layer) two layer compression system, regular Discharge Instructions: Apply Urgo K2 Lite as directed (alternative to 3 layer compression). Wound #4 - Lower Leg Wound Laterality: Left, Lateral, Proximal Cleanser: Soap and Water Discharge Instructions: May shower and wash wound with dial antibacterial soap and water prior to dressing change. Peri-Wound Care: Sween Lotion (Moisturizing lotion) Discharge Instructions: Apply moisturizing lotion as directed Prim Dressing: Maxorb Extra Ag+ Alginate Dressing, 4x4.75 (  in/in) ary Discharge Instructions: Apply to wound bed as instructed Secondary Dressing: ABD Pad, 8x10 Discharge Instructions: Apply over primary dressing as directed. Secondary Dressing: Woven Gauze Sponge, Non-Sterile 4x4 in Discharge Instructions: Apply over primary dressing as directed. Compression Wrap: Urgo K2 Lite, (equivalent to a 3 layer) two layer compression system, regular Discharge Instructions: Apply Urgo K2 Lite as directed (alternative to 3 layer compression). Wound #5 - Lower Leg Wound Laterality: Right, Medial, Anterior Cleanser: Soap and Water Discharge Instructions: May shower and wash wound with dial antibacterial soap and water prior to dressing change. Peri-Wound Care: Sween Lotion (Moisturizing lotion) Discharge Instructions: Apply moisturizing lotion as directed Prim Dressing: Maxorb Extra Ag+ Alginate Dressing, 4x4.75 (in/in) ary Discharge Instructions: Apply to wound bed as instructed Secondary Dressing: ABD Pad, 8x10 Discharge Instructions: Apply over primary dressing as directed. PRIMITIVO, MERKEY ([id number]) [id number] Page 8 of 14 Secondary Dressing: Woven Gauze Sponge, Non-Sterile 4x4 in Discharge Instructions: Apply over primary  dressing as directed. Compression Wrap: Urgo K2 Lite, (equivalent to a 3 layer) two layer compression system, regular Discharge Instructions: Apply Urgo K2 Lite as directed (alternative to 3 layer compression). Electronic Signature(s) Signed: 12/17/2022 9:38:21 AM By: Duanne Guess MD FACS Entered By: Duanne Guess on 12/17/2022 09:28:04 -------------------------------------------------------------------------------- Problem List Details Patient Name: Date of Service: LANDIN, TALLON RLES 12/17/2022 8:00 A M Medical Record Number: [id number] Patient Account Number: [id number] Date of Birth/Sex: Treating RN: 1960/12/05 (62 y.o. M) Primary Care Provider: Hoy Register Other Clinician: Referring Provider: Treating Provider/Extender: Camillo Flaming Weeks in Treatment: 0 Active Problems ICD-10 Encounter Code Description Active Date MDM Diagnosis L97.822 Non-pressure chronic ulcer of other part of left lower leg with fat layer 12/17/2022 No Yes exposed L97.812 Non-pressure chronic ulcer of other part of right lower leg with fat layer 12/17/2022 No Yes exposed E11.622 Type 2 diabetes mellitus with other skin ulcer 12/17/2022 No Yes I87.2 Venous insufficiency (chronic) (peripheral) 12/17/2022 No Yes E66.01 Morbid (severe) obesity due to excess calories 12/17/2022 No Yes Inactive Problems Resolved Problems Electronic Signature(s) Signed: 12/17/2022 9:20:26 AM By: Duanne Guess MD FACS Previous Signature: 12/17/2022 7:54:24 AM Version By: Duanne Guess MD FACS Previous Signature: 12/17/2022 7:49:54 AM Version By: Duanne Guess MD FACS Entered By: Duanne Guess on 12/17/2022 09:20:26 [name] ([id number]) [id number] Page 9 of 14 -------------------------------------------------------------------------------- Progress Note Details Patient Name: Date of Service: KOTARO, BUER RLES 12/17/2022 8:00 A M Medical Record  Number: [id number] Patient Account Number: [id number] Date of Birth/Sex: Treating RN: Feb 11, 1961 (62 y.o. 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 History of Present Illness (HPI) ADMISSION 12/17/2022 ***ABIs R: 1.07; L: 1.01*** This is a 62 year old type II diabetic (last hemoglobin A1c 7.6%) who struck his leg on the corner of his bed in mid August. He was seen at his PCPs office and given a course of antibiotics (Bactrim) due to swelling and purulent drainage from the site. A month later, the wound still had not healed and his primary care doctor referred him to wound care center for further evaluation and management. A course of doxycycline was prescribed at that time. He says he is still taking the antibiotic. Patient History Information obtained from Chart. Allergies lisinopril, pioglitazone Family History Diabetes, Heart Disease. Social History Never smoker, Marital Status - Single, Alcohol Use -  Never, Drug Use - No History, Caffeine Use - Rarely. Medical History Cardiovascular Patient has history of Hypertension Endocrine Patient has history of Type II Diabetes Hospitalization/Surgery History - colonoscopy. Medical A Surgical History Notes nd Musculoskeletal Degenerative joint disease Review of Systems (ROS) Constitutional Symptoms (General Health) Complains or has symptoms of Marked Weight Change - obesity. Objective Constitutional No acute distress. Vitals Time Taken: 8:00 AM, Height: 75 in, Weight: 360 lbs, BMI: 45, Temperature: 97.6 F, Pulse: 63 bpm, Respiratory Rate: 18 breaths/min, Blood Pressure: 122/74 mmHg, Capillary Blood Glucose: 65  mg/dl. Respiratory Normal work of breathing on room air. Cardiovascular Changes consistent with chronic venous insufficiency. General Notes: 12/17/2022: He has a total of 5 wounds, 4 on the left and 1 on the right. They all have a layer of eschar on the surface. The fat layer is exposed underneath. No signs concerning for infection. Integumentary (Hair, Skin) Wound #1 status is Open. Original cause of wound was Blister. The date acquired was: 09/01/2022. The wound is located on the Left,Medial,Anterior Lower Leg. The wound measures 0.9cm length x 1.1cm width x 0.1cm depth; 0.778cm^2 area and 0.078cm^3 volume. There is Fat Layer (Subcutaneous Tissue) exposed. There is no tunneling or undermining noted. There is a medium amount of serosanguineous drainage noted. The wound margin is distinct with the outline attached to the wound base. There is medium (34-66%) red, pink granulation within the wound bed. There is a medium (34-66%) amount of necrotic tissue within HARSHIL, CAVALLARO ([id number]) [id number] Page 10 of 14 the wound bed including Eschar. The periwound skin appearance exhibited: Scarring, Dry/Scaly, Hemosiderin Staining. Wound #2 status is Open. Original cause of wound was Blister. The date acquired was: 09/01/2022. The wound is located on the Left,Lateral Lower Leg. The wound measures 7cm length x 0.8cm width x 0.1cm depth; 4.398cm^2 area and 0.44cm^3 volume. There is Fat Layer (Subcutaneous Tissue) exposed. There is no tunneling noted. There is a medium amount of serosanguineous drainage noted. The wound margin is distinct with the outline attached to the wound base. There is medium (34-66%) red, pink granulation within the wound bed. There is a medium (34-66%) amount of necrotic tissue within the wound bed including Eschar. The periwound skin appearance exhibited: Scarring, Hemosiderin Staining. The periwound skin appearance did not exhibit: Dry/Scaly.  Periwound temperature was noted as No Abnormality. Wound #3 status is Open. Original cause of wound was Blister. The date acquired was: 12/15/2022. The wound is located on the Left,Proximal,Medial,Anterior Lower Leg. The wound measures 2cm length x 1cm width x 0.1cm depth; 1.571cm^2 area and 0.157cm^3 volume. There is Fat Layer (Subcutaneous Tissue) exposed. There is no tunneling or undermining noted. There is a medium amount of serosanguineous drainage noted. The wound margin is distinct with the outline attached to the wound base. There is medium (34-66%) red, pink granulation within the wound bed. There is a medium (34-66%) amount of necrotic tissue within the wound bed including Eschar and Adherent Slough. The periwound skin appearance exhibited: Scarring, Dry/Scaly, Hemosiderin Staining. Periwound temperature was noted as No Abnormality. Wound #4 status is Open. Original cause of wound was Blister. The date acquired was: 09/01/2022. The wound is located on the Left,Proximal,Lateral Lower Leg. The wound measures 0.6cm length x 0.6cm width x 0.1cm depth; 0.283cm^2 area and 0.028cm^3 volume. There is Fat Layer (Subcutaneous Tissue) exposed. There is no tunneling or undermining noted. There is a medium amount of serosanguineous drainage noted. The wound margin is distinct with the outline attached to the wound base. There is  medium (34-66%) red, pink granulation within the wound bed. There is a medium (34-66%) amount of necrotic tissue within the wound bed including Eschar and Adherent Slough. The periwound skin appearance exhibited: Scarring, Dry/Scaly, Hemosiderin Staining. Periwound temperature was noted as No Abnormality. Wound #5 status is Open. Original cause of wound was Blister. The date acquired was: 09/01/2022. The wound is located on the Right,Medial,Anterior Lower Leg. The wound measures 2.2cm length x 0.7cm width x 0.1cm depth; 1.21cm^2 area and 0.121cm^3 volume. There is Fat Layer  (Subcutaneous Tissue) exposed. There is no tunneling or undermining noted. There is a medium amount of serosanguineous drainage noted. The wound margin is distinct with the outline attached to the wound base. There is medium (34-66%) red, pink granulation within the wound bed. There is a medium (34-66%) amount of necrotic tissue within the wound bed including Eschar and Adherent Slough. The periwound skin appearance exhibited: Scarring, Dry/Scaly, Hemosiderin Staining. Periwound temperature was noted as No Abnormality. Assessment Active Problems ICD-10 Non-pressure chronic ulcer of other part of left lower leg with fat layer exposed Non-pressure chronic ulcer of other part of right lower leg with fat layer exposed Type 2 diabetes mellitus with other skin ulcer Venous insufficiency (chronic) (peripheral) Morbid (severe) obesity due to excess calories Procedures Wound #1 Pre-procedure diagnosis of Wound #1 is a Venous Leg Ulcer located on the Left,Medial,Anterior Lower Leg .Severity of Tissue Pre Debridement is: Fat layer exposed. There was a Selective/Open Wound Non-Viable Tissue Debridement with a total area of 0.78 sq cm performed by Duanne Guess, MD. With the following instrument(s): Curette to remove Non-Viable tissue/material. Material removed includes Eschar and Slough and after achieving pain control using Lidocaine 4% T opical Solution. No specimens were taken. A time out was conducted at 08:45, prior to the start of the procedure. A Minimum amount of bleeding was controlled with Pressure. The procedure was tolerated well. Post Debridement Measurements: 0.9cm length x 1.1cm width x 0.1cm depth; 0.078cm^3 volume. Character of Wound/Ulcer Post Debridement is stable. Severity of Tissue Post Debridement is: Fat layer exposed. Post procedure Diagnosis Wound #1: Same as Pre-Procedure Pre-procedure diagnosis of Wound #1 is a Venous Leg Ulcer located on the Left,Medial,Anterior Lower Leg .  There was a Three Layer Compression Therapy Procedure by Brenton Grills, RN. Post procedure Diagnosis Wound #1: Same as Pre-Procedure Wound #2 Pre-procedure diagnosis of Wound #2 is a Venous Leg Ulcer located on the Left,Lateral Lower Leg .Severity of Tissue Pre Debridement is: Fat layer exposed. There was a Selective/Open Wound Non-Viable Tissue Debridement with a total area of 1.88 sq cm performed by Duanne Guess, MD. With the following instrument(s): Curette to remove Non-Viable tissue/material. Material removed includes Eschar and Slough and after achieving pain control using Lidocaine 4% Topical Solution. No specimens were taken. A time out was conducted at 08:45, prior to the start of the procedure. A Minimum amount of bleeding was controlled with Pressure. The procedure was tolerated well. Post Debridement Measurements: 3cm length x 0.8cm width x 0.1cm depth; 0.188cm^3 volume. Character of Wound/Ulcer Post Debridement is improved. Severity of Tissue Post Debridement is: Fat layer exposed. Post procedure Diagnosis Wound #2: Same as Pre-Procedure Pre-procedure diagnosis of Wound #2 is a Venous Leg Ulcer located on the Left,Lateral Lower Leg . There was a Three Layer Compression Therapy Procedure by Brenton Grills, RN. Post procedure Diagnosis Wound #2: Same as Pre-Procedure Wound #3 Pre-procedure diagnosis of Wound #3 is a Venous Leg Ulcer located on the Left,Proximal,Medial,Anterior Lower Leg .Severity of Tissue Pre  Debridement is: Fat layer exposed. There was a Selective/Open Wound Non-Viable Tissue Debridement with a total area of 1.57 sq cm performed by Duanne Guess, MD. With the following instrument(s): Curette to remove Non-Viable tissue/material. Material removed includes Eschar and Slough and after achieving pain control using Lidocaine 4% T opical Solution. No specimens were taken. A time out was conducted at 08:45, prior to the start of the procedure. A Minimum amount  of bleeding was controlled with Pressure. The procedure was tolerated well. Post Debridement Measurements: 2cm length x 1cm width x 0.1cm depth; 0.157cm^3 volume. Character of Wound/Ulcer Post Debridement is stable. Severity of Tissue Post Debridement is: Fat layer exposed. OLMAN, YONO ([id number]) [id number] Page 11 of 14 Post procedure Diagnosis Wound #3: Same as Pre-Procedure Pre-procedure diagnosis of Wound #3 is a Venous Leg Ulcer located on the Left,Proximal,Medial,Anterior Lower Leg . There was a Three Layer Compression Therapy Procedure by Brenton Grills, RN. Post procedure Diagnosis Wound #3: Same as Pre-Procedure Wound #4 Pre-procedure diagnosis of Wound #4 is a Venous Leg Ulcer located on the Left,Proximal,Lateral Lower Leg .Severity of Tissue Pre Debridement is: Fat layer exposed. There was a Selective/Open Wound Non-Viable Tissue Debridement with a total area of 0.28 sq cm performed by Duanne Guess, MD. With the following instrument(s): Curette to remove Non-Viable tissue/material. Material removed includes Eschar and Slough and after achieving pain control using Lidocaine 4% T opical Solution. No specimens were taken. A time out was conducted at 08:45, prior to the start of the procedure. A Minimum amount of bleeding was controlled with Pressure. The procedure was tolerated well. Post Debridement Measurements: 0.6cm length x 0.6cm width x 0.1cm depth; 0.028cm^3 volume. Character of Wound/Ulcer Post Debridement is stable. Severity of Tissue Post Debridement is: Fat layer exposed. Post procedure Diagnosis Wound #4: Same as Pre-Procedure Pre-procedure diagnosis of Wound #4 is a Venous Leg Ulcer located on the Left,Proximal,Lateral Lower Leg . There was a Three Layer Compression Therapy Procedure by Brenton Grills, RN. Post procedure Diagnosis Wound #4: Same as Pre-Procedure Wound #5 Pre-procedure diagnosis of Wound #5 is a Venous Leg Ulcer located  on the Right,Medial,Anterior Lower Leg .Severity of Tissue Pre Debridement is: Fat layer exposed. There was a Selective/Open Wound Non-Viable Tissue Debridement with a total area of 1.21 sq cm performed by Duanne Guess, MD. With the following instrument(s): Curette to remove Non-Viable tissue/material. Material removed includes Eschar and Slough and after achieving pain control using Lidocaine 4% T opical Solution. No specimens were taken. A time out was conducted at 08:45, prior to the start of the procedure. A Minimum amount of bleeding was controlled with Pressure. The procedure was tolerated well. Post Debridement Measurements: 2.2cm length x 0.7cm width x 0.1cm depth; 0.121cm^3 volume. Character of Wound/Ulcer Post Debridement is improved. Severity of Tissue Post Debridement is: Fat layer exposed. Post procedure Diagnosis Wound #5: Same as Pre-Procedure Pre-procedure diagnosis of Wound #5 is a Venous Leg Ulcer located on the Right,Medial,Anterior Lower Leg . There was a Three Layer Compression Therapy Procedure by Brenton Grills, RN. Post procedure Diagnosis Wound #5: Same as Pre-Procedure Plan Follow-up Appointments: Return Appointment in 1 week. Anesthetic: Wound #1 Left,Medial,Anterior Lower Leg: (In clinic) Topical Lidocaine 4% applied to wound bed - In clinic Wound #2 Left,Lateral Lower Leg: (In clinic) Topical Lidocaine 4% applied to wound bed - In clinic Wound #3 Left,Proximal,Medial,Anterior Lower Leg: (In clinic) Topical Lidocaine 4% applied to wound bed - In clinic Wound #4 Left,Proximal,Lateral Lower Leg: (In clinic) Topical Lidocaine 4% applied to  wound bed - In clinic Wound #5 Right,Medial,Anterior Lower Leg: (In clinic) Topical Lidocaine 4% applied to wound bed - In clinic Bathing/ Shower/ Hygiene: May shower with protection but do not get wound dressing(s) wet. Protect dressing(s) with water repellant cover (for example, large plastic bag) or a cast cover and may  then take shower. - Can sponge bathe. Edema Control - Lymphedema / SCD / Other: Elevate legs to the level of the heart or above for 30 minutes daily and/or when sitting for 3-4 times a day throughout the day. Avoid standing for long periods of time. Exercise regularly If compression wraps slide down please call wound center and speak with a nurse. Additional Orders / Instructions: Follow Nutritious Diet - 70-100g of protein per day. Sources of protein include lean chicken, fish, [place], nuts, tofu, protein shakes, eggs, [place] yogurt, etc. WOUND #1: - Lower Leg Wound Laterality: Left, Medial, Anterior Cleanser: Soap and Water Discharge Instructions: May shower and wash wound with dial antibacterial soap and water prior to dressing change. Peri-Wound Care: Sween Lotion (Moisturizing lotion) Discharge Instructions: Apply moisturizing lotion as directed Prim Dressing: Maxorb Extra Ag+ Alginate Dressing, 4x4.75 (in/in) ary Discharge Instructions: Apply to wound bed as instructed Secondary Dressing: ABD Pad, 8x10 Discharge Instructions: Apply over primary dressing as directed. Secondary Dressing: Woven Gauze Sponge, Non-Sterile 4x4 in Discharge Instructions: Apply over primary dressing as directed. Com pression Wrap: Urgo K2 Lite, (equivalent to a 3 layer) two layer compression system, regular Discharge Instructions: Apply Urgo K2 Lite as directed (alternative to 3 layer compression). WOUND #2: - Lower Leg Wound Laterality: Left, Lateral Cleanser: Soap and Water Discharge Instructions: May shower and wash wound with dial antibacterial soap and water prior to dressing change. Peri-Wound Care: Sween Lotion (Moisturizing lotion) Discharge Instructions: Apply moisturizing lotion as directed Prim Dressing: Maxorb Extra Ag+ Alginate Dressing, 4x4.75 (in/in) ary Discharge Instructions: Apply to wound bed as instructed Secondary Dressing: ABD Pad, 8x10 Discharge Instructions: Apply over primary  dressing as directed. WANDA, CELLUCCI ([id number]) [id number] Page 12 of 14 Secondary Dressing: Woven Gauze Sponge, Non-Sterile 4x4 in Discharge Instructions: Apply over primary dressing as directed. Com pression Wrap: Urgo K2 Lite, (equivalent to a 3 layer) two layer compression system, regular Discharge Instructions: Apply Urgo K2 Lite as directed (alternative to 3 layer compression). WOUND #3: - Lower Leg Wound Laterality: Left, Medial, Anterior, Proximal Cleanser: Soap and Water Discharge Instructions: May shower and wash wound with dial antibacterial soap and water prior to dressing change. Peri-Wound Care: Sween Lotion (Moisturizing lotion) Discharge Instructions: Apply moisturizing lotion as directed Prim Dressing: Maxorb Extra Ag+ Alginate Dressing, 4x4.75 (in/in) ary Discharge Instructions: Apply to wound bed as instructed Secondary Dressing: ABD Pad, 8x10 Discharge Instructions: Apply over primary dressing as directed. Secondary Dressing: Woven Gauze Sponge, Non-Sterile 4x4 in Discharge Instructions: Apply over primary dressing as directed. Com pression Wrap: Urgo K2 Lite, (equivalent to a 3 layer) two layer compression system, regular Discharge Instructions: Apply Urgo K2 Lite as directed (alternative to 3 layer compression). WOUND #4: - Lower Leg Wound Laterality: Left, Lateral, Proximal Cleanser: Soap and Water Discharge Instructions: May shower and wash wound with dial antibacterial soap and water prior to dressing change. Peri-Wound Care: Sween Lotion (Moisturizing lotion) Discharge Instructions: Apply moisturizing lotion as directed Prim Dressing: Maxorb Extra Ag+ Alginate Dressing, 4x4.75 (in/in) ary Discharge Instructions: Apply to wound bed as instructed Secondary Dressing: ABD Pad, 8x10 Discharge Instructions: Apply over primary dressing as directed. Secondary Dressing: Woven Gauze Sponge, Non-Sterile 4x4 in Discharge Instructions:  Apply  over primary dressing as directed. Com pression Wrap: Urgo K2 Lite, (equivalent to a 3 layer) two layer compression system, regular Discharge Instructions: Apply Urgo K2 Lite as directed (alternative to 3 layer compression). WOUND #5: - Lower Leg Wound Laterality: Right, Medial, Anterior Cleanser: Soap and Water Discharge Instructions: May shower and wash wound with dial antibacterial soap and water prior to dressing change. Peri-Wound Care: Sween Lotion (Moisturizing lotion) Discharge Instructions: Apply moisturizing lotion as directed Prim Dressing: Maxorb Extra Ag+ Alginate Dressing, 4x4.75 (in/in) ary Discharge Instructions: Apply to wound bed as instructed Secondary Dressing: ABD Pad, 8x10 Discharge Instructions: Apply over primary dressing as directed. Secondary Dressing: Woven Gauze Sponge, Non-Sterile 4x4 in Discharge Instructions: Apply over primary dressing as directed. Com pression Wrap: Urgo K2 Lite, (equivalent to a 3 layer) two layer compression system, regular Discharge Instructions: Apply Urgo K2 Lite as directed (alternative to 3 layer compression). 12/17/2022: This is a 62 year old morbidly obese, poorly controlled type II diabetic who has clinical signs of chronic venous insufficiency and multiple lower extremity ulcers. He does not wear compression stockings or other compression garments at baseline. There are 5 wounds on his legs with 4 being on the left and 1 on the right.-Eschar were debrided from all of his wounds using a curette. We will apply silver alginate to these with Urgo light compression wraps. He will benefit from long-term use of compression garments. Given his body habitus, he will probably do best with Fabian November wraps. He has some concerns about his insurance coverage so we have not yet ordered these for him but he says he will check on it. Follow-up in 1 week. Electronic Signature(s) Signed: 12/17/2022 9:29:57 AM By: Duanne Guess MD FACS Entered By:  Duanne Guess on 12/17/2022 09:29:57 -------------------------------------------------------------------------------- HxROS Details Patient Name: Date of Service: IZAK, ANDING RLES 12/17/2022 8:00 A M Medical Record Number: [id number] Patient Account Number: [id number] Date of Birth/Sex: Treating RN: May 09, 1960 (62 y.o. Yates Decamp Primary Care Provider: Hoy Register Other Clinician: Referring Provider: Treating Provider/Extender: Camillo Flaming Weeks in Treatment: 0 Information Obtained From Chart Constitutional Symptoms (General Health) Complaints and Symptoms: Positive for: Marked Weight Change - obesity Cardiovascular MEHTAB, DOLBERRY ([id number]) [id number] Page 13 of 14 Medical History: Positive for: Hypertension Endocrine Medical History: Positive for: Type II Diabetes Musculoskeletal Medical History: Past Medical History Notes: Degenerative joint disease Immunizations Pneumococcal Vaccine: Received Pneumococcal Vaccination: No Implantable Devices No devices added Hospitalization / Surgery History Type of Hospitalization/Surgery colonoscopy Family and Social History Diabetes: Yes; Heart Disease: Yes; Never smoker; Marital Status - Single; Alcohol Use: Never; Drug Use: No History; Caffeine Use: Rarely; Financial Concerns: No; Food, Clothing or Shelter Needs: No; Support System Lacking: No; Transportation Concerns: No Electronic Signature(s) Signed: 12/17/2022 9:38:21 AM By: Duanne Guess MD FACS Signed: 12/17/2022 4:07:14 PM By: Brenton Grills Entered By: Brenton Grills on 12/17/2022 07:58:43 -------------------------------------------------------------------------------- SuperBill Details Patient Name: Date of Service: MATEUSZ, NEILAN RLES 12/17/2022 Medical Record Number: [id number] Patient Account Number: [id number] Date of Birth/Sex: Treating RN: May 18, 1960 (62 y.o. Yates Decamp Primary Care  Provider: Hoy Register Other Clinician: Referring Provider: Treating Provider/Extender: Camillo Flaming Weeks in Treatment: 0 Diagnosis Coding ICD-10 Codes Code Description [phone] Non-pressure chronic ulcer of other part of left lower leg with fat layer exposed L97.812 Non-pressure chronic ulcer of other part of right lower leg with fat layer exposed E11.622 Type 2 diabetes mellitus with other skin ulcer I87.2 Venous insufficiency (chronic) (peripheral) E66.01 Morbid (severe) obesity due to excess calories  Facility Procedures : CPT4 Code: [id number] Description: WOUND CARE VISIT-LEV 3 NEW PT Modifier: 25 Quantity: 1 : DELTA, DESHMUKH CPT4 Code: [id number] HARLES ([id number] Description: [phone] - DEBRIDE WOUND 1ST 20 SQ CM OR < ICD-10 Diagnosis Description L97.822 Non-pressure chronic ulcer of other part of left lower leg with fat layer expose L97.812 Non-pressure chronic ulcer of other part of right lower leg with fat layer  expos ) [id number] Modifier: d ed [id number] Quantity: 1 .pdf Page 14 of 14 Physician Procedures : CPT4 Code Description Modifier [id number] - WC PHYS LEVEL 4 - NEW PT 25 ICD-10 Diagnosis Description L97.822 Non-pressure chronic ulcer of other part of left lower leg with fat layer exposed L97.812 Non-pressure chronic ulcer of other part of right  lower leg with fat layer exposed E11.622 Type 2 diabetes mellitus with other skin ulcer I87.2 Venous insufficiency (chronic) (peripheral) Quantity: 1 : [id number] - WC PHYS DEBR WO ANESTH 20 SQ CM ICD-10 Diagnosis Description L97.822 Non-pressure chronic ulcer of other part of left lower leg with fat layer exposed L97.812 Non-pressure chronic ulcer of other part of right lower leg with fat layer  exposed Quantity: 1 Electronic Signature(s) Signed: 12/17/2022 9:30:27 AM By: Duanne Guess MD FACS Entered By: Duanne Guess on 12/17/2022 09:30:27

## 2022-12-18 ENCOUNTER — Other Ambulatory Visit: Payer: Self-pay

## 2022-12-19 ENCOUNTER — Other Ambulatory Visit: Payer: Self-pay

## 2022-12-24 ENCOUNTER — Encounter (HOSPITAL_BASED_OUTPATIENT_CLINIC_OR_DEPARTMENT_OTHER): Payer: Self-pay | Admitting: General Surgery

## 2022-12-25 NOTE — Progress Notes (Signed)
Dillon Mcgee, Dillon Mcgee ([id number]) [id number] Page 1 of 8 Visit Report for 12/24/2022 Arrival Information Details Patient Name: Date of Service: Dillon Mcgee, Dillon Mcgee 12/24/2022 7:30 A M Medical Record Number: [id number] Patient Account Number: [id number] Date of Birth/Sex: Treating RN: 1960-09-03 (62 y.o. [name] Primary Care Idrees Quam: Hoy Register Other Clinician: Referring Shriyans Kuenzi: Treating Aamya Orellana/Extender: Camillo Flaming Weeks in Treatment: 1 Visit Information History Since Last Visit Added or deleted any medications: No Patient Arrived: Ambulatory Any new allergies or adverse reactions: No Arrival Time: 07:45 Had a fall or experienced change in No Accompanied By: self activities of daily living that may affect Transfer Assistance: None risk of falls: Patient Identification Verified: Yes Signs or symptoms of abuse/neglect since last visito No Secondary Verification Process Completed: Yes Hospitalized since last visit: No Patient Requires Transmission-Based Precautions: No Implantable device outside of the clinic excluding No Patient Has Alerts: No cellular tissue based products placed in the center since last visit: Has Dressing in Place as Prescribed: Yes Has Compression in Place as Prescribed: Yes Pain Present Now: No Electronic Signature(s) Signed: 12/24/2022 5:45:00 PM By: Zenaida Deed RN, BSN Entered By: Zenaida Deed on 12/24/2022 05:07:35 -------------------------------------------------------------------------------- Compression Therapy Details Patient Name: Date of Service: Dillon Mcgee, Dillon Mcgee 12/24/2022 7:30 A M Medical Record Number: [id number] Patient Account Number: [id number] Date of Birth/Sex: Treating RN: 1960-04-17 (62 y.o. [name] Primary Care Yue Flanigan: Hoy Register Other Clinician: Referring Joevanni Roddey: Treating Azaylea Maves/Extender: Camillo Flaming Weeks in Treatment:  1 Compression Therapy Performed for Wound Assessment: Wound #5 Right,Medial,Anterior Lower Leg Performed By: Clinician Zenaida Deed, RN Compression Type: Double Layer Notes Stephannie Li Electronic Signature(s) Signed: 12/24/2022 5:45:00 PM By: Zenaida Deed RN, BSN Entered By: Zenaida Deed on 12/24/2022 05:08:51 [name] ([id number]) [id number] Page 2 of 8 -------------------------------------------------------------------------------- Compression Therapy Details Patient Name: Date of Service: Dillon Mcgee, Dillon Mcgee 12/24/2022 7:30 A M Medical Record Number: [id number] Patient Account Number: [id number] Date of Birth/Sex: Treating RN: [birth year] (62 y.o. [name] Primary Care Sevana Grandinetti: Hoy Register Other Clinician: Referring Alen Matheson: Treating Avleen Bordwell/Extender: Camillo Flaming Weeks in Treatment: 1 Compression Therapy Performed for Wound Assessment: Wound #1 Left,Medial,Anterior Lower Leg Performed By: Clinician Zenaida Deed, RN Compression Type: Double Layer Notes Stephannie Li Electronic Signature(s) Signed: 12/24/2022 5:45:00 PM By: Zenaida Deed RN, BSN Entered By: Zenaida Deed on 12/24/2022 05:08:51 -------------------------------------------------------------------------------- Encounter Discharge Information Details Patient Name: Date of Service: Dillon Mcgee, Dillon Mcgee 12/24/2022 7:30 A M Medical Record Number: [id number] Patient Account Number: [id number] Date of Birth/Sex: Treating RN: 1960/07/02 (62 y.o. [name] Primary Care Malya Cirillo: Hoy Register Other Clinician: Referring Visente Kirker: Treating Spike Desilets/Extender: Camillo Flaming Weeks in Treatment: 1 Encounter Discharge Information Items Discharge Condition: Stable Ambulatory Status: Ambulatory Discharge Destination: Home Transportation: Private Auto Accompanied By: self Schedule Follow-up Appointment: Yes Clinical  Summary of Care: Patient Declined Electronic Signature(s) Signed: 12/24/2022 5:45:00 PM By: Zenaida Deed RN, BSN Entered By: Zenaida Deed on 12/24/2022 05:09:29 -------------------------------------------------------------------------------- Patient/Caregiver Education Details Patient Name: Date of Service: Dillon Mcgee Mcgee 10/23/2024andnbsp7:30 A M Medical Record Number: [id number] Patient Account Number: [id number] Date of Birth/Gender: Treating RN: 1961-02-15 (62 y.o. [name] Primary Care Physician: Hoy Register Other Clinician: Referring Physician: Treating Physician/Extender: Camillo Flaming Weeks in Treatment: 1 Education Assessment Education Provided To: Patient Education Topics Provided Dillon Mcgee, Dillon Mcgee ([id number]) [id number] Page 3 of 8 Venous: Methods: Explain/Verbal Responses: Reinforcements needed, State content correctly Electronic Signature(s) Signed: 12/24/2022 5:45:00 PM By: Zenaida Deed RN, BSN Entered By: Zenaida Deed on  12/24/2022 05:09:15 -------------------------------------------------------------------------------- Wound Assessment Details Patient Name: Date of Service: Dillon Mcgee, Dillon Mcgee 12/24/2022 7:30 A M Medical Record Number: [id number] Patient Account Number: [id number] Date of Birth/Sex: Treating RN: 29-Mar-1960 (62 y.o. [name] Primary Care Hazely Sealey: Hoy Register Other Clinician: Referring Delos Klich: Treating Hero Mccathern/Extender: Camillo Flaming Weeks in Treatment: 1 Wound Status Wound Number: 1 Primary Etiology: Venous Leg Ulcer Wound Location: Left, Medial, Anterior Lower Leg Wound Status: Open Wounding Event: Blister Date Acquired: 09/01/2022 Weeks Of Treatment: 1 Clustered Wound: No Wound Measurements Length: (cm) 0.9 Width: (cm) 1.1 Depth: (cm) 0.1 Area: (cm) 0.778 Volume: (cm) 0.078 % Reduction in Area: 0% % Reduction in Volume:  0% Wound Description Classification: Full Thickness Without Exposed Suppo Exudate Amount: Medium Exudate Type: Serosanguineous Exudate Color: red, brown rt Structures Periwound Skin Texture Texture Color No Abnormalities Noted: No No Abnormalities Noted: No Moisture No Abnormalities Noted: No Treatment Notes Wound #1 (Lower Leg) Wound Laterality: Left, Medial, Anterior Cleanser Soap and Water Discharge Instruction: May shower and wash wound with dial antibacterial soap and water prior to dressing change. Peri-Wound Care Sween Lotion (Moisturizing lotion) Discharge Instruction: Apply moisturizing lotion as directed Topical Primary Dressing Maxorb Extra Ag+ Alginate Dressing, 4x4.75 (in/in) Discharge Instruction: Apply to wound bed as instructed Secondary Dressing ABD Pad, 8x10 CLIDE, BRAINERD ([id number]) [id number] Page 4 of 8 Discharge Instruction: Apply over primary dressing as directed. Woven Gauze Sponge, Non-Sterile 4x4 in Discharge Instruction: Apply over primary dressing as directed. Secured With Compression Wrap Urgo K2 Lite, (equivalent to a 3 layer) two layer compression system, regular Discharge Instruction: Apply Urgo K2 Lite as directed (alternative to 3 layer compression). Compression Stockings Add-Ons Electronic Signature(s) Signed: 12/24/2022 5:45:00 PM By: Zenaida Deed RN, BSN Entered By: Zenaida Deed on 12/24/2022 05:08:19 -------------------------------------------------------------------------------- Wound Assessment Details Patient Name: Date of Service: Dillon Mcgee, Dillon Mcgee 12/24/2022 7:30 A M Medical Record Number: [id number] Patient Account Number: [id number] Date of Birth/Sex: Treating RN: Aug 12, 1960 (62 y.o. [name] Primary Care Kadija Cruzen: Hoy Register Other Clinician: Referring Jacarius Handel: Treating Amahia Madonia/Extender: Camillo Flaming Weeks in Treatment: 1 Wound Status Wound Number:  2 Primary Etiology: Venous Leg Ulcer Wound Location: Left, Lateral Lower Leg Wound Status: Open Wounding Event: Blister Date Acquired: 09/01/2022 Weeks Of Treatment: 1 Clustered Wound: No Wound Measurements Length: (cm) 7 Width: (cm) 0.8 Depth: (cm) 0.1 Area: (cm) 4.398 Volume: (cm) 0.44 % Reduction in Area: 0% % Reduction in Volume: 0% Wound Description Classification: Full Thickness Without Exposed Suppor Exudate Amount: Medium Exudate Type: Serosanguineous Exudate Color: red, brown t Structures Periwound Skin Texture Texture Color No Abnormalities Noted: No No Abnormalities Noted: No Moisture No Abnormalities Noted: No Treatment Notes Wound #2 (Lower Leg) Wound Laterality: Left, Lateral Cleanser Soap and Water Discharge Instruction: May shower and wash wound with dial antibacterial soap and water prior to dressing change. Peri-Wound Care Sween Lotion (Moisturizing lotion) Discharge Instruction: Apply moisturizing lotion as directed QURON, KELTY ([id number]) [id number] Page 5 of 8 Topical Primary Dressing Maxorb Extra Ag+ Alginate Dressing, 4x4.75 (in/in) Discharge Instruction: Apply to wound bed as instructed Secondary Dressing ABD Pad, 8x10 Discharge Instruction: Apply over primary dressing as directed. Woven Gauze Sponge, Non-Sterile 4x4 in Discharge Instruction: Apply over primary dressing as directed. Secured With Compression Wrap Urgo K2 Lite, (equivalent to a 3 layer) two layer compression system, regular Discharge Instruction: Apply Urgo K2 Lite as directed (alternative to 3 layer compression). Compression Stockings Add-Ons Electronic Signature(s) Signed: 12/24/2022 5:45:00 PM By: Zenaida Deed RN, BSN Entered By: Daneil Dan,  Bonita Quin on 12/24/2022 05:08:19 -------------------------------------------------------------------------------- Wound Assessment Details Patient Name: Date of Service: Dillon Mcgee, Dillon Mcgee 12/24/2022 7:30  A M Medical Record Number: [id number] Patient Account Number: [id number] Date of Birth/Sex: Treating RN: May 18, 1960 (62 y.o. [name] Primary Care Marcelene Weidemann: Hoy Register Other Clinician: Referring Adalis Gatti: Treating Brinna Divelbiss/Extender: Camillo Flaming Weeks in Treatment: 1 Wound Status Wound Number: 3 Primary Etiology: Venous Leg Ulcer Wound Location: Left, Proximal, Medial, Anterior Lower Leg Wound Status: Open Wounding Event: Blister Date Acquired: 12/15/2022 Weeks Of Treatment: 1 Clustered Wound: No Wound Measurements Length: (cm) 2 Width: (cm) 1 Depth: (cm) 0.1 Area: (cm) 1.571 Volume: (cm) 0.157 % Reduction in Area: 0% % Reduction in Volume: 0% Wound Description Classification: Full Thickness Without Exposed Suppor Exudate Amount: Medium Exudate Type: Serosanguineous Exudate Color: red, brown t Structures Periwound Skin Texture Texture Color No Abnormalities Noted: No No Abnormalities Noted: No Moisture No Abnormalities Noted: No Treatment Notes Wound #3 (Lower Leg) Wound Laterality: Left, Medial, Anterior, Proximal [name] ([id number]) [id number] Page 6 of 8 Cleanser Soap and Water Discharge Instruction: May shower and wash wound with dial antibacterial soap and water prior to dressing change. Peri-Wound Care Sween Lotion (Moisturizing lotion) Discharge Instruction: Apply moisturizing lotion as directed Topical Primary Dressing Maxorb Extra Ag+ Alginate Dressing, 4x4.75 (in/in) Discharge Instruction: Apply to wound bed as instructed Secondary Dressing ABD Pad, 8x10 Discharge Instruction: Apply over primary dressing as directed. Woven Gauze Sponge, Non-Sterile 4x4 in Discharge Instruction: Apply over primary dressing as directed. Secured With Compression Wrap Urgo K2 Lite, (equivalent to a 3 layer) two layer compression system, regular Discharge Instruction: Apply Urgo K2 Lite as directed  (alternative to 3 layer compression). Compression Stockings Add-Ons Electronic Signature(s) Signed: 12/24/2022 5:45:00 PM By: Zenaida Deed RN, BSN Entered By: Zenaida Deed on 12/24/2022 05:08:19 -------------------------------------------------------------------------------- Wound Assessment Details Patient Name: Date of Service: Dillon Mcgee, Dillon Mcgee Mcgee 12/24/2022 7:30 A M Medical Record Number: [id number] Patient Account Number: [id number] Date of Birth/Sex: Treating RN: 1960/07/11 (62 y.o. [name] Primary Care Tanairi Cypert: Hoy Register Other Clinician: Referring Lorrane Mccay: Treating Jamyron Redd/Extender: Camillo Flaming Weeks in Treatment: 1 Wound Status Wound Number: 4 Primary Etiology: Venous Leg Ulcer Wound Location: Left, Proximal, Lateral Lower Leg Wound Status: Open Wounding Event: Blister Date Acquired: 09/01/2022 Weeks Of Treatment: 1 Clustered Wound: No Wound Measurements Length: (cm) 0.6 Width: (cm) 0.6 Depth: (cm) 0.1 Area: (cm) 0.283 Volume: (cm) 0.028 % Reduction in Area: 0% % Reduction in Volume: 0% Wound Description Classification: Full Thickness Without Exposed Support Exudate Amount: Medium Exudate Type: Serosanguineous Exudate Color: red, brown Structures Periwound Skin [address] JAHMIER, WILLETT ([id number]) [id number] Page 7 of 8 No Abnormalities Noted: No No Abnormalities Noted: No Moisture No Abnormalities Noted: No Treatment Notes Wound #4 (Lower Leg) Wound Laterality: Left, Lateral, Proximal Cleanser Soap and Water Discharge Instruction: May shower and wash wound with dial antibacterial soap and water prior to dressing change. Peri-Wound Care Sween Lotion (Moisturizing lotion) Discharge Instruction: Apply moisturizing lotion as directed Topical Primary Dressing Maxorb Extra Ag+ Alginate Dressing, 4x4.75 (in/in) Discharge Instruction: Apply to wound bed as instructed Secondary  Dressing ABD Pad, 8x10 Discharge Instruction: Apply over primary dressing as directed. Woven Gauze Sponge, Non-Sterile 4x4 in Discharge Instruction: Apply over primary dressing as directed. Secured With Compression Wrap Urgo K2 Lite, (equivalent to a 3 layer) two layer compression system, regular Discharge Instruction: Apply Urgo K2 Lite as directed (alternative to 3 layer compression). Compression Stockings Add-Ons Electronic Signature(s) Signed: 12/24/2022 5:45:00 PM By: Daneil Dan,  Legrand Como, BSN Entered By: Zenaida Deed on 12/24/2022 05:08:19 -------------------------------------------------------------------------------- Wound Assessment Details Patient Name: Date of Service: STACIE, FILBRUN Mcgee 12/24/2022 7:30 A M Medical Record Number: [id number] Patient Account Number: [id number] Date of Birth/Sex: Treating RN: 20-Dec-1960 (62 y.o. [name] Primary Care Roel Douthat: Hoy Register Other Clinician: Referring Favor Hackler: Treating Deshae Dickison/Extender: Camillo Flaming Weeks in Treatment: 1 Wound Status Wound Number: 5 Primary Etiology: Venous Leg Ulcer Wound Location: Right, Medial, Anterior Lower Leg Wound Status: Open Wounding Event: Blister Date Acquired: 09/01/2022 Weeks Of Treatment: 1 Clustered Wound: No Wound Measurements Length: (cm) 2.2 Width: (cm) 0.7 Depth: (cm) 0.1 Area: (cm) 1.21 Volume: (cm) 0.121 Stoffer, Mercury ([id number]) Wound Description Classification: Full Thickness Without Exposed Suppor Exudate Amount: Medium Exudate Type: Serosanguineous Exudate Color: red, brown t Structures % Reduction in Area: 0% % Reduction in Volume: 0% [id number] Page 8 of 8 Periwound Skin Texture Texture Color No Abnormalities Noted: No No Abnormalities Noted: No Moisture No Abnormalities Noted: No Treatment Notes Wound #5 (Lower Leg) Wound Laterality: Right, Medial, Anterior Cleanser Soap and Water Discharge  Instruction: May shower and wash wound with dial antibacterial soap and water prior to dressing change. Peri-Wound Care Sween Lotion (Moisturizing lotion) Discharge Instruction: Apply moisturizing lotion as directed Topical Primary Dressing Maxorb Extra Ag+ Alginate Dressing, 4x4.75 (in/in) Discharge Instruction: Apply to wound bed as instructed Secondary Dressing ABD Pad, 8x10 Discharge Instruction: Apply over primary dressing as directed. Woven Gauze Sponge, Non-Sterile 4x4 in Discharge Instruction: Apply over primary dressing as directed. Secured With Compression Wrap Urgo K2 Lite, (equivalent to a 3 layer) two layer compression system, regular Discharge Instruction: Apply Urgo K2 Lite as directed (alternative to 3 layer compression). Compression Stockings Add-Ons Electronic Signature(s) Signed: 12/24/2022 5:45:00 PM By: Zenaida Deed RN, BSN Entered By: Zenaida Deed on 12/24/2022 05:08:19

## 2022-12-25 NOTE — Progress Notes (Signed)
DOBIE, PAWELSKI ([id number]) [id number] Page 1 of 1 Visit Report for 12/24/2022 SuperBill Details Patient Name: Date of Service: Dillon Mcgee, Dillon Mcgee 12/24/2022 Medical Record Number: [id number] Patient Account Number: [id number] Date of Birth/Sex: Treating RN: [birth year] (62 y.o. 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 Electronic Signature(s) Signed: 12/24/2022 11:18:33 AM By: Duanne Guess MD FACS Signed: 12/24/2022 5:45:00 PM By: Zenaida Deed RN, BSN Entered By: Zenaida Deed on 12/24/2022 05:09:38

## 2022-12-31 ENCOUNTER — Encounter (HOSPITAL_BASED_OUTPATIENT_CLINIC_OR_DEPARTMENT_OTHER): Payer: Self-pay | Admitting: General Surgery

## 2023-01-01 NOTE — Progress Notes (Signed)
Dillon Mcgee, Dillon Mcgee ([id number]) [id number] Page 1 of 9 Visit Report for 12/31/2022 Chief Complaint Document Details Patient Name: Date of Service: Dillon Mcgee, Dillon Mcgee 12/31/2022 9:30 A M Medical Record Number: [id number] Patient Account Number: [id number] Date of Birth/Sex: Treating RN: 09-16-1960 (62 y.o. M) Primary Care Provider: Hoy Register Other Clinician: Referring Provider: Treating Provider/Extender: Camillo Flaming Weeks in Treatment: 2 Information Obtained from: Patient Chief Complaint Patients presents for treatment of open diabetic ulcers of the left and right leg in the setting of chronic venous insufficiency. Electronic Signature(s) Signed: 12/31/2022 10:25:15 AM By: Duanne Guess MD FACS Entered By: Duanne Guess on 12/31/2022 10:25:15 -------------------------------------------------------------------------------- Debridement Details Patient Name: Date of Service: Dillon Mcgee, Dillon Mcgee 12/31/2022 9:30 A M Medical Record Number: [id number] Patient Account Number: [id number] Date of Birth/Sex: Treating RN: 05/11/1960 (62 y.o. Cline Cools Primary Care Provider: Hoy Register Other Clinician: Referring Provider: Treating Provider/Extender: Camillo Flaming Weeks in Treatment: 2 Debridement Performed for Assessment: Wound #2 Left,Lateral Lower Leg Performed By: Physician Duanne Guess, MD The following information was scribed by: Redmond Pulling The information was scribed for: Duanne Guess Debridement Type: Debridement Severity of Tissue Pre Debridement: Fat layer exposed Level of Consciousness (Pre-procedure): Awake and Alert Pre-procedure Verification/Time Out Yes - 10:09 Taken: Start Time: 10:09 Percent of Wound Bed Debrided: 100% T Area Debrided (cm): otal 0.27 Tissue and other material debrided: Non-Viable, Eschar Level: Non-Viable Tissue Debridement Description: Selective/Open  Wound Instrument: Curette Bleeding: None Response to Treatment: Procedure was tolerated well Level of Consciousness (Post- Awake and Alert procedure): Post Debridement Measurements of Total Wound Length: (cm) 0.7 Width: (cm) 0.5 Depth: (cm) 0.1 Volume: (cm) 0.027 Character of Wound/Ulcer Post Debridement: Improved Severity of Tissue Post Debridement: Fat layer exposed Post Procedure Diagnosis BRAXXTON, GANGE ([id number]) [phone].pdf Page 2 of 9 Same as Pre-procedure Electronic Signature(s) Signed: 12/31/2022 10:52:03 AM By: Duanne Guess MD FACS Signed: 12/31/2022 5:03:07 PM By: Redmond Pulling RN, BSN Entered By: Redmond Pulling on 12/31/2022 10:10:28 -------------------------------------------------------------------------------- Debridement Details Patient Name: Date of Service: Dillon Mcgee, Dillon Mcgee 12/31/2022 9:30 A M Medical Record Number: [id number] Patient Account Number: [id number] Date of Birth/Sex: Treating RN: [birth year] (62 y.o. Cline Cools Primary Care Provider: Hoy Register Other Clinician: Referring Provider: Treating Provider/Extender: Camillo Flaming Weeks in Treatment: 2 Debridement Performed for Assessment: Wound #3 Left,Distal,Lateral Lower Leg Performed By: Physician Duanne Guess, MD Debridement Type: Debridement Severity of Tissue Pre Debridement: Fat layer exposed Level of Consciousness (Pre-procedure): Awake and Alert Pre-procedure Verification/Time Out Yes - 10:09 Taken: Start Time: 10:09 Percent of Wound Bed Debrided: 100% T Area Debrided (cm): otal 0.27 Tissue and other material debrided: Non-Viable, Eschar Level: Non-Viable Tissue Debridement Description: Selective/Open Wound Instrument: Curette Bleeding: None Response to Treatment: Procedure was tolerated well Level of Consciousness (Post- Awake and Alert procedure): Post Debridement Measurements of Total Wound Length: (cm)  0.7 Width: (cm) 0.5 Depth: (cm) 0.1 Volume: (cm) 0.027 Character of Wound/Ulcer Post Debridement: Improved Severity of Tissue Post Debridement: Fat layer exposed Post Procedure Diagnosis Same as Pre-procedure Electronic Signature(s) Signed: 12/31/2022 10:52:03 AM By: Duanne Guess MD FACS Signed: 12/31/2022 5:03:07 PM By: Redmond Pulling RN, BSN Entered By: Redmond Pulling on 12/31/2022 10:11:02 -------------------------------------------------------------------------------- HPI Details Patient Name: Date of Service: Dillon Mcgee, Dillon Mcgee 12/31/2022 9:30 A M Medical Record Number: [id number] Patient Account Number: [id number] Date of Birth/Sex: Treating RN: Dec 12, 1960 (62 y.o. M) Primary Care Provider: Hoy Register Other Clinician: Referring Provider: Treating Provider/Extender: Camillo Flaming Weeks in Treatment: 2 Knoblock, Leonette Most ([id number])  [phone].pdf Page 3 of 9 History of Present Illness HPI Description: ADMISSION 12/17/2022 ***ABIs R: 1.07; L: 1.01*** This is a 62 year old type II diabetic (last hemoglobin A1c 7.6%) who struck his leg on the corner of his bed in mid August. He was seen at his PCPs office and given a course of antibiotics (Bactrim) due to swelling and purulent drainage from the site. A month later, the wound still had not healed and his primary care doctor referred him to wound care center for further evaluation and management. A course of doxycycline was prescribed at that time. He says he is still taking the antibiotic. 12/31/2022: Most of his wounds have healed. There are 2 small open ulcers on his lateral left leg that have a little bit of eschar on them. He did not communicate with his insurance program regarding coverage for compression garments. Electronic Signature(s) Signed: 12/31/2022 10:26:07 AM By: Duanne Guess MD FACS Entered By: Duanne Guess on 12/31/2022  10:26:07 -------------------------------------------------------------------------------- Physical Exam Details Patient Name: Date of Service: Dillon Mcgee, Dillon Mcgee 12/31/2022 9:30 A M Medical Record Number: [id number] Patient Account Number: [id number] Date of Birth/Sex: Treating RN: Jul 15, 1960 (62 y.o. M) Primary Care Provider: Hoy Register Other Clinician: Referring Provider: Treating Provider/Extender: Whitney Muse, [name] Weeks in Treatment: 2 Constitutional . . . . no acute distress. Respiratory Normal work of breathing on room air.. Notes 12/31/2022: Most of his wounds have healed. There are 2 small open ulcers on his lateral left leg that have a little bit of eschar on them. Electronic Signature(s) Signed: 12/31/2022 10:26:40 AM By: Duanne Guess MD FACS Entered By: Duanne Guess on 12/31/2022 10:26:40 -------------------------------------------------------------------------------- Physician Orders Details Patient Name: Date of Service: Dillon Mcgee, Dillon Mcgee 12/31/2022 9:30 A M Medical Record Number: [id number] Patient Account Number: [id number] Date of Birth/Sex: Treating RN: [birth year] (62 y.o. Cline Cools Primary Care Provider: Hoy Register Other Clinician: Referring Provider: Treating Provider/Extender: Camillo Flaming Weeks in Treatment: 2 Verbal / Phone Orders: No Diagnosis Coding ICD-10 Coding Code Description [phone] Non-pressure chronic ulcer of other part of left lower leg with fat layer exposed L97.812 Non-pressure chronic ulcer of other part of right lower leg with fat layer exposed E11.622 Type 2 diabetes mellitus with other skin ulcer Dillon Mcgee, Dillon Mcgee ([id number]) [phone].pdf Page 4 of 9 I87.2 Venous insufficiency (chronic) (peripheral) E66.01 Morbid (severe) obesity due to excess calories Follow-up Appointments ppointment in 1 week. - Dr Lady Gary 01/07/23 @ 0930 Return A ppointment in 2  weeks. - please schedule Return A Other: - When you speak with Byram, ask what cost would be for another wrap for the right leg Anesthetic Wound #2 Left,Lateral Lower Leg (In clinic) Topical Lidocaine 4% applied to wound bed - In clinic Wound #3 Left,Distal,Lateral Lower Leg (In clinic) Topical Lidocaine 4% applied to wound bed - In clinic Bathing/ Shower/ Hygiene May shower with protection but do not get wound dressing(s) wet. Protect dressing(s) with water repellant cover (for example, large plastic bag) or a cast cover and may then take shower. - Can sponge bathe. Additional Orders / Instructions Follow Nutritious Diet - 70-100g of protein per day. Sources of protein include lean chicken, fish, [place], nuts, tofu, protein shakes, eggs, [place] yogurt, etc. Wound Treatment Wound #2 - Lower Leg Wound Laterality: Left, Lateral Cleanser: Soap and Water 1 x Per Week/30 Days Discharge Instructions: May shower and wash wound with dial antibacterial soap and water prior to dressing change. Peri-Wound Care: Sween Lotion (Moisturizing lotion) 1 x Per Week/30 Days Discharge Instructions:  Apply moisturizing lotion as directed Prim Dressing: Maxorb Extra Ag+ Alginate Dressing, 4x4.75 (in/in) 1 x Per Week/30 Days ary Discharge Instructions: Apply to wound bed as instructed Secondary Dressing: ABD Pad, 8x10 1 x Per Week/30 Days Discharge Instructions: Apply over primary dressing as directed. Secondary Dressing: Woven Gauze Sponge, Non-Sterile 4x4 in 1 x Per Week/30 Days Discharge Instructions: Apply over primary dressing as directed. Compression Wrap: Urgo K2 Lite, (equivalent to a 3 layer) two layer compression system, regular 1 x Per Week/30 Days Discharge Instructions: Apply Urgo K2 Lite as directed (alternative to 3 layer compression). Compression Stockings: Jobst Farrow Wrap 4000 Left Leg Compression Amount: 30-40 mmHG Discharge Instructions: Apply Renee Pain daily as instructed. Apply first  thing in the morning, remove at night before bed. Wound #3 - Lower Leg Wound Laterality: Left, Lateral, Distal Cleanser: Soap and Water 1 x Per Week/30 Days Discharge Instructions: May shower and wash wound with dial antibacterial soap and water prior to dressing change. Peri-Wound Care: Sween Lotion (Moisturizing lotion) 1 x Per Week/30 Days Discharge Instructions: Apply moisturizing lotion as directed Prim Dressing: Maxorb Extra Ag+ Alginate Dressing, 4x4.75 (in/in) 1 x Per Week/30 Days ary Discharge Instructions: Apply to wound bed as instructed Secondary Dressing: ABD Pad, 8x10 1 x Per Week/30 Days Discharge Instructions: Apply over primary dressing as directed. Secondary Dressing: Woven Gauze Sponge, Non-Sterile 4x4 in 1 x Per Week/30 Days Discharge Instructions: Apply over primary dressing as directed. Compression Wrap: Urgo K2 Lite, (equivalent to a 3 layer) two layer compression system, regular 1 x Per Week/30 Days Discharge Instructions: Apply Urgo K2 Lite as directed (alternative to 3 layer compression). Electronic Signature(s) Signed: 12/31/2022 10:52:03 AM By: Duanne Guess MD FACS Entered By: Duanne Guess on 12/31/2022 10:29:22 [name] ([id number]) [id number] Page 5 of 9 -------------------------------------------------------------------------------- Problem List Details Patient Name: Date of Service: MANFORD, ECKHARDT 12/31/2022 9:30 A M Medical Record Number: [id number] Patient Account Number: [id number] Date of Birth/Sex: Treating RN: [birth year] (62 y.o. M) Primary Care Provider: Hoy Register Other Clinician: Referring Provider: Treating Provider/Extender: Camillo Flaming Weeks in Treatment: 2 Active Problems ICD-10 Encounter Code Description Active Date MDM Diagnosis L97.822 Non-pressure chronic ulcer of other part of left lower leg with fat layer 12/17/2022 No Yes exposed L97.812 Non-pressure chronic  ulcer of other part of right lower leg with fat layer 12/17/2022 No Yes exposed E11.622 Type 2 diabetes mellitus with other skin ulcer 12/17/2022 No Yes I87.2 Venous insufficiency (chronic) (peripheral) 12/17/2022 No Yes E66.01 Morbid (severe) obesity due to excess calories 12/17/2022 No Yes Inactive Problems Resolved Problems Electronic Signature(s) Signed: 12/31/2022 10:25:01 AM By: Duanne Guess MD FACS Entered By: Duanne Guess on 12/31/2022 10:25:00 -------------------------------------------------------------------------------- Progress Note Details Patient Name: Date of Service: Dillon Mcgee, Dillon Mcgee 12/31/2022 9:30 A M Medical Record Number: [id number] Patient Account Number: [id number] Date of Birth/Sex: Treating RN: 1960-12-08 (62 y.o. M) Primary Care Provider: Hoy Register Other Clinician: Referring Provider: Treating Provider/Extender: Camillo Flaming Weeks in Treatment: 2 Subjective Chief Complaint Information obtained from Patient Patients presents for treatment of open diabetic ulcers of the left and right leg in the setting of chronic venous insufficiency. OSTIN, SEEBECK ([id number]) [id number] Page 6 of 9 History of Present Illness (HPI) ADMISSION 12/17/2022 ***ABIs R: 1.07; L: 1.01*** This is a 62 year old type II diabetic (last hemoglobin A1c 7.6%) who struck his leg on the corner of his bed in mid August. He was seen at his PCPs office and given a course of antibiotics (Bactrim) due to swelling  and purulent drainage from the site. A month later, the wound still had not healed and his primary care doctor referred him to wound care center for further evaluation and management. A course of doxycycline was prescribed at that time. He says he is still taking the antibiotic. 12/31/2022: Most of his wounds have healed. There are 2 small open ulcers on his lateral left leg that have a little bit of eschar on them. He did  not communicate with his insurance program regarding coverage for compression garments. Patient History Information obtained from Chart. Family History Diabetes, Heart Disease. Social History Never smoker, Marital Status - Single, Alcohol Use - Never, Drug Use - No History, Caffeine Use - Rarely. Medical History Cardiovascular Patient has history of Hypertension Endocrine Patient has history of Type II Diabetes Hospitalization/Surgery History - colonoscopy. Medical A Surgical History Notes nd Musculoskeletal Degenerative joint disease Objective Constitutional no acute distress. Vitals Time Taken: 9:31 AM, Weight: 360 lbs, Temperature: 97.6 F, Pulse: 60 bpm, Respiratory Rate: 18 breaths/min, Blood Pressure: 135/77 mmHg, Capillary Blood Glucose: 65 mg/dl. Respiratory Normal work of breathing on room air.. General Notes: 12/31/2022: Most of his wounds have healed. There are 2 small open ulcers on his lateral left leg that have a little bit of eschar on them. Integumentary (Hair, Skin) Wound #1 status is Open. Original cause of wound was Blister. The date acquired was: 09/01/2022. The wound has been in treatment 2 weeks. The wound is located on the Left,Anterior Lower Leg. The wound measures 0cm length x 0cm width x 0cm depth; 0cm^2 area and 0cm^3 volume. There is no tunneling or undermining noted. There is a none present amount of drainage noted. There is no granulation within the wound bed. There is no necrotic tissue within the wound bed. Wound #2 status is Open. Original cause of wound was Blister. The date acquired was: 09/01/2022. The wound has been in treatment 2 weeks. The wound is located on the Left,Lateral Lower Leg. The wound measures 0.7cm length x 0.5cm width x 0.1cm depth; 0.275cm^2 area and 0.027cm^3 volume. There is Fat Layer (Subcutaneous Tissue) exposed. There is no tunneling or undermining noted. There is a medium amount of serosanguineous drainage noted. There is large  (67-100%) red granulation within the wound bed. There is no necrotic tissue within the wound bed. Wound #3 status is Open. Original cause of wound was Blister. The date acquired was: 12/15/2022. The wound has been in treatment 2 weeks. The wound is located on the Left,Distal,Lateral Lower Leg. The wound measures 0.7cm length x 0.5cm width x 0.1cm depth; 0.275cm^2 area and 0.027cm^3 volume. There is no tunneling or undermining noted. There is a medium amount of serosanguineous drainage noted. There is large (67-100%) red granulation within the wound bed. There is no necrotic tissue within the wound bed. Wound #4 status is Healed - Epithelialized. Original cause of wound was Blister. The date acquired was: 09/01/2022. The wound has been in treatment 2 weeks. The wound is located on the Left,Proximal,Lateral Lower Leg. The wound measures 0cm length x 0cm width x 0cm depth; 0cm^2 area and 0cm^3 volume. There is no tunneling or undermining noted. There is a none present amount of drainage noted. Wound #5 status is Open. Original cause of wound was Blister. The date acquired was: 09/01/2022. The wound has been in treatment 2 weeks. The wound is located on the Right,Lateral Lower Leg. The wound measures 0cm length x 0cm width x 0cm depth; 0cm^2 area and 0cm^3 volume. There is no  tunneling or undermining noted. There is a none present amount of drainage noted. There is no granulation within the wound bed. There is no necrotic tissue within the wound bed. Assessment Dillon Mcgee, Dillon Mcgee ([id number]) [id number] Page 7 of 9 Active Problems ICD-10 Non-pressure chronic ulcer of other part of left lower leg with fat layer exposed Non-pressure chronic ulcer of other part of right lower leg with fat layer exposed Type 2 diabetes mellitus with other skin ulcer Venous insufficiency (chronic) (peripheral) Morbid (severe) obesity due to excess calories Procedures Wound #2 Pre-procedure  diagnosis of Wound #2 is a Venous Leg Ulcer located on the Left,Lateral Lower Leg .Severity of Tissue Pre Debridement is: Fat layer exposed. There was a Selective/Open Wound Non-Viable Tissue Debridement with a total area of 0.27 sq cm performed by Duanne Guess, MD. With the following instrument(s): Curette to remove Non-Viable tissue/material. Material removed includes Eschar. No specimens were taken. A time out was conducted at 10:09, prior to the start of the procedure. There was no bleeding. The procedure was tolerated well. Post Debridement Measurements: 0.7cm length x 0.5cm width x 0.1cm depth; 0.027cm^3 volume. Character of Wound/Ulcer Post Debridement is improved. Severity of Tissue Post Debridement is: Fat layer exposed. Post procedure Diagnosis Wound #2: Same as Pre-Procedure Wound #3 Pre-procedure diagnosis of Wound #3 is a Venous Leg Ulcer located on the Left,Distal,Lateral Lower Leg .Severity of Tissue Pre Debridement is: Fat layer exposed. There was a Selective/Open Wound Non-Viable Tissue Debridement with a total area of 0.27 sq cm performed by Duanne Guess, MD. With the following instrument(s): Curette to remove Non-Viable tissue/material. Material removed includes Eschar. No specimens were taken. A time out was conducted at 10:09, prior to the start of the procedure. There was no bleeding. The procedure was tolerated well. Post Debridement Measurements: 0.7cm length x 0.5cm width x 0.1cm depth; 0.027cm^3 volume. Character of Wound/Ulcer Post Debridement is improved. Severity of Tissue Post Debridement is: Fat layer exposed. Post procedure Diagnosis Wound #3: Same as Pre-Procedure Plan Follow-up Appointments: Return Appointment in 1 week. - Dr Lady Gary 01/07/23 @ 0930 Return Appointment in 2 weeks. - please schedule Other: - When you speak with [facility]ram, ask what cost would be for another wrap for the right leg Anesthetic: Wound #2 Left,Lateral Lower Leg: (In clinic) Topical  Lidocaine 4% applied to wound bed - In clinic Wound #3 Left,Distal,Lateral Lower Leg: (In clinic) Topical Lidocaine 4% applied to wound bed - In clinic Bathing/ Shower/ Hygiene: May shower with protection but do not get wound dressing(s) wet. Protect dressing(s) with water repellant cover (for example, large plastic bag) or a cast cover and may then take shower. - Can sponge bathe. Additional Orders / Instructions: Follow Nutritious Diet - 70-100g of protein per day. Sources of protein include lean chicken, fish, [place], nuts, tofu, protein shakes, eggs, [place] yogurt, etc. WOUND #2: - Lower Leg Wound Laterality: Left, Lateral Cleanser: Soap and Water 1 x Per Week/30 Days Discharge Instructions: May shower and wash wound with dial antibacterial soap and water prior to dressing change. Peri-Wound Care: Sween Lotion (Moisturizing lotion) 1 x Per Week/30 Days Discharge Instructions: Apply moisturizing lotion as directed Prim Dressing: Maxorb Extra Ag+ Alginate Dressing, 4x4.75 (in/in) 1 x Per Week/30 Days ary Discharge Instructions: Apply to wound bed as instructed Secondary Dressing: ABD Pad, 8x10 1 x Per Week/30 Days Discharge Instructions: Apply over primary dressing as directed. Secondary Dressing: Woven Gauze Sponge, Non-Sterile 4x4 in 1 x Per Week/30 Days Discharge Instructions: Apply over primary dressing as directed.  Com pression Wrap: Urgo K2 Lite, (equivalent to a 3 layer) two layer compression system, regular 1 x Per Week/30 Days Discharge Instructions: Apply Urgo K2 Lite as directed (alternative to 3 layer compression). Com pression Stockings: Jobst Farrow Wrap 4000 Compression Amount: 30-40 mmHg (left) Discharge Instructions: Apply Renee Pain daily as instructed. Apply first thing in the morning, remove at night before bed. WOUND #3: - Lower Leg Wound Laterality: Left, Lateral, Distal Cleanser: Soap and Water 1 x Per Week/30 Days Discharge Instructions: May shower and wash wound  with dial antibacterial soap and water prior to dressing change. Peri-Wound Care: Sween Lotion (Moisturizing lotion) 1 x Per Week/30 Days Discharge Instructions: Apply moisturizing lotion as directed Prim Dressing: Maxorb Extra Ag+ Alginate Dressing, 4x4.75 (in/in) 1 x Per Week/30 Days ary Discharge Instructions: Apply to wound bed as instructed Secondary Dressing: ABD Pad, 8x10 1 x Per Week/30 Days Discharge Instructions: Apply over primary dressing as directed. Secondary Dressing: Woven Gauze Sponge, Non-Sterile 4x4 in 1 x Per Week/30 Days Discharge Instructions: Apply over primary dressing as directed. Com pression Wrap: Urgo K2 Lite, (equivalent to a 3 layer) two layer compression system, regular 1 x Per Week/30 Days Discharge Instructions: Apply Urgo K2 Lite as directed (alternative to 3 layer compression). 12/31/2022: Most of his wounds have healed. There are 2 small open ulcers on his lateral left leg that have a little bit of eschar on them. He did not communicate with his insurance program regarding coverage for compression garments. Dillon Mcgee, Dillon Mcgee ([id number]) [id number] Page 8 of 9 I used a curette to debride the eschar off of the lateral leg wounds. Will continue silver alginate and Urgo light compression. He was reminded that he needs to be responsible for discussing coverage of compression garments with whomever is helping him with his medical bills (he says he does not have actual insurance but some form of discount plan). In addition, will we are taking care of his wounds, we noticed what appeared to be fungal plaques on his upper medial thighs and suggested that he use an over-the-counter jock itch treatment. Follow-up in 1 week. Electronic Signature(s) Signed: 12/31/2022 10:30:38 AM By: Duanne Guess MD FACS Entered By: Duanne Guess on 12/31/2022  10:30:38 -------------------------------------------------------------------------------- HxROS Details Patient Name: Date of Service: RAYDELL, HENIGAN Mcgee 12/31/2022 9:30 A M Medical Record Number: [id number] Patient Account Number: [id number] Date of Birth/Sex: Treating RN: 1960/06/03 (62 y.o. M) Primary Care Provider: Hoy Register Other Clinician: Referring Provider: Treating Provider/Extender: Camillo Flaming Weeks in Treatment: 2 Information Obtained From Chart Cardiovascular Medical History: Positive for: Hypertension Endocrine Medical History: Positive for: Type II Diabetes Musculoskeletal Medical History: Past Medical History Notes: Degenerative joint disease Immunizations Pneumococcal Vaccine: Received Pneumococcal Vaccination: No Implantable Devices No devices added Hospitalization / Surgery History Type of Hospitalization/Surgery colonoscopy Family and Social History Diabetes: Yes; Heart Disease: Yes; Never smoker; Marital Status - Single; Alcohol Use: Never; Drug Use: No History; Caffeine Use: Rarely; Financial Concerns: No; Food, Clothing or Shelter Needs: No; Support System Lacking: No; Transportation Concerns: No Electronic Signature(s) Signed: 12/31/2022 10:52:03 AM By: Duanne Guess MD FACS Entered By: Duanne Guess on 12/31/2022 10:26:13 [name] ([id number]) [id number].pdf Page 9 of 9 -------------------------------------------------------------------------------- SuperBill Details Patient Name: Date of Service: NALAN, CAPIZZI 12/31/2022 Medical Record Number: [id number] Patient Account Number: [id number] Date of Birth/Sex: Treating RN: 24-Sep-1960 (62 y.o. 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

## 2023-01-01 NOTE — Progress Notes (Signed)
Dillon Mcgee ([id number]) [id number] Page 1 of 12 Visit Report for 12/31/2022 Arrival Information Details Patient Name: Date of Service: Dillon Mcgee, Dillon Mcgee Mcgee 12/31/2022 9:30 A M Medical Record Number: [id number] Patient Account Number: [id number] Date of Birth/Sex: Treating RN: 21-May-1960 (62 y.o. Cline Cools Primary Care Yong Grieser: Hoy Register Other Clinician: Referring Brynnlie Unterreiner: Treating Vasti Yagi/Extender: Camillo Flaming Weeks in Treatment: 2 Visit Information History Since Last Visit Added or deleted any medications: No Patient Arrived: Ambulatory Any new allergies or adverse reactions: No Arrival Time: 09:28 Had a fall or experienced change in No Accompanied By: self activities of daily living that may affect Transfer Assistance: None risk of falls: Patient Identification Verified: Yes Signs or symptoms of abuse/neglect since last visito No Secondary Verification Process Completed: Yes Hospitalized since last visit: No Patient Requires Transmission-Based Precautions: No Implantable device outside of the clinic excluding No Patient Has Alerts: No cellular tissue based products placed in the center since last visit: Has Dressing in Place as Prescribed: Yes Has Compression in Place as Prescribed: Yes Pain Present Now: No Electronic Signature(s) Signed: 12/31/2022 5:03:07 PM By: Redmond Pulling RN, BSN Entered By: Redmond Pulling on 12/31/2022 09:29:39 -------------------------------------------------------------------------------- Encounter Discharge Information Details Patient Name: Date of Service: Dillon Mcgee, Dillon Mcgee Mcgee 12/31/2022 9:30 A M Medical Record Number: [id number] Patient Account Number: [id number] Date of Birth/Sex: Treating RN: May 29, 1960 (62 y.o. Cline Cools Primary Care Katria Botts: Hoy Register Other Clinician: Referring Nafisah Runions: Treating Edin Skarda/Extender: Camillo Flaming Weeks in  Treatment: 2 Encounter Discharge Information Items Post Procedure Vitals Discharge Condition: Stable Temperature (F): 97.6 Ambulatory Status: Ambulatory Pulse (bpm): 60 Discharge Destination: Home Respiratory Rate (breaths/min): 18 Transportation: Private Auto Blood Pressure (mmHg): 135/77 Accompanied By: self Schedule Follow-up Appointment: Yes Clinical Summary of Care: Patient Declined Electronic Signature(s) Signed: 12/31/2022 5:03:07 PM By: Redmond Pulling RN, BSN Entered By: Redmond Pulling on 12/31/2022 10:33:23 [name] ([id number]) [id number] Page 2 of 12 -------------------------------------------------------------------------------- Lower Extremity Assessment Details Patient Name: Date of Service: Dillon Mcgee, Dillon Mcgee 12/31/2022 9:30 A M Medical Record Number: [id number] Patient Account Number: [id number] Date of Birth/Sex: Treating RN: 12/15/1960 (62 y.o. Cline Cools Primary Care Mathias Bogacki: Hoy Register Other Clinician: Referring Jireh Elmore: Treating Jaeleen Inzunza/Extender: Camillo Flaming Weeks in Treatment: 2 Edema Assessment Assessed: [Left: No] [Right: No] [Left: Edema] [Right: :] Calf Left: Right: Point of Measurement: From Medial Instep 43 cm 48.5 cm Ankle Left: Right: Point of Measurement: From Medial Instep 24 cm 24.2 cm Vascular Assessment Pulses: Dorsalis Pedis Palpable: [Left:Yes] [Right:Yes] Extremity colors, hair growth, and conditions: Extremity Color: [Left:Normal] [Right:Normal] Hair Growth on Extremity: [Left:Yes] [Right:Yes] Temperature of Extremity: [Left:Warm] [Right:Warm] Capillary Refill: [Left:< 3 seconds] [Right:< 3 seconds] Dependent Rubor: [Left:No No] [Right:No No] Electronic Signature(s) Signed: 12/31/2022 5:03:07 PM By: Redmond Pulling RN, BSN Entered By: Redmond Pulling on 12/31/2022 09:37:24 -------------------------------------------------------------------------------- Multi  Wound Chart Details Patient Name: Date of Service: Dillon Mcgee, Dillon Mcgee Mcgee 12/31/2022 9:30 A M Medical Record Number: [id number] Patient Account Number: [id number] Date of Birth/Sex: Treating RN: 11/28/1960 (62 y.o. M) Primary Care Storm Dulski: Hoy Register Other Clinician: Referring Noralee Dutko: Treating Dorsie Sethi/Extender: Camillo Flaming Weeks in Treatment: 2 Vital Signs Height(in): Capillary Blood Glucose(mg/dl): 65 Weight(lbs): 536 Pulse(bpm): 60 Body Mass Index(BMI): Blood Pressure(mmHg): 135/77 Temperature(F): 97.6 Respiratory Rate(breaths/min): 18 [1:Photos:] [3:[id number].pdf Page 3 of 12] Left, Anterior Lower Leg Left, Lateral Lower Leg Left, Distal, Lateral Lower Leg Wound Location: Blister Blister Blister Wounding Event: Venous Leg Ulcer Venous Leg Ulcer Venous Leg Ulcer Primary Etiology: Hypertension, Type II Diabetes Hypertension,  Type II Diabetes Hypertension, Type II Diabetes Comorbid History: 09/01/2022 09/01/2022 12/15/2022 Date Acquired: 2 2 2  Weeks of Treatment: Open Open Open Wound Status: No No No Wound Recurrence: 0x0x0 0.7x0.5x0.1 0.7x0.5x0.1 Measurements L x W x D (cm) 0 0.275 0.275 A (cm) : rea 0 0.027 0.027 Volume (cm) : 100.00% 93.70% 82.50% % Reduction in A rea: 100.00% 93.90% 82.80% % Reduction in Volume: Full Thickness Without Exposed Full Thickness Without Exposed Full Thickness Without Exposed Classification: Support Structures Support Structures Support Structures None Present Medium Medium Exudate A mount: N/A Serosanguineous Serosanguineous Exudate Type: N/A red, brown red, brown Exudate Color: None Present (0%) Large (67-100%) Large (67-100%) Granulation A mount: N/A Red Red Granulation Quality: None Present (0%) None Present (0%) None Present (0%) Necrotic A mount: Fascia: No Fat Layer (Subcutaneous Tissue): Yes N/A Exposed Structures: Fat Layer (Subcutaneous Tissue): No Tendon:  No Muscle: No Joint: No Bone: No Large (67-100%) Large (67-100%) Large (67-100%) Epithelialization: N/A Debridement - Selective/Open Wound Debridement - Selective/Open Wound Debridement: Pre-procedure Verification/Time Out N/A 10:09 10:09 Taken: N/A Necrotic/Eschar Necrotic/Eschar Tissue Debrided: N/A Non-Viable Tissue Non-Viable Tissue Level: N/A 0.27 0.27 Debridement A (sq cm): rea N/A Curette Curette Instrument: N/A None None Bleeding: N/A Procedure was tolerated well Procedure was tolerated well Debridement Treatment Response: N/A 0.7x0.5x0.1 0.7x0.5x0.1 Post Debridement Measurements L x W x D (cm) N/A 0.027 0.027 Post Debridement Volume: (cm) N/A Debridement Debridement Procedures Performed: Wound Number: 4 5 N/A Photos: N/A Left, Proximal, Lateral Lower Leg Right, Lateral Lower Leg N/A Wound Location: Blister Blister N/A Wounding Event: Venous Leg Ulcer Venous Leg Ulcer N/A Primary Etiology: Hypertension, Type II Diabetes Hypertension, Type II Diabetes N/A Comorbid History: 09/01/2022 09/01/2022 N/A Date Acquired: 2 2 N/A Weeks of Treatment: Healed - Epithelialized Open N/A Wound Status: No No N/A Wound Recurrence: 0x0x0 0x0x0 N/A Measurements L x W x D (cm) 0 0 N/A A (cm) : rea 0 0 N/A Volume (cm) : 100.00% 100.00% N/A % Reduction in A rea: 100.00% 100.00% N/A % Reduction in Volume: Full Thickness Without Exposed Full Thickness Without Exposed N/A Classification: Support Structures Support Structures None Present None Present N/A Exudate Amount: N/A N/A N/A Exudate Type: N/A N/A N/A Exudate Color: N/A None Present (0%) N/A Granulation Amount: N/A N/A N/A Granulation Quality: N/A None Present (0%) N/A Necrotic Amount: Fascia: No N/A N/A Exposed Structures: Fat Layer (Subcutaneous Tissue): No Tendon: No Cowher, Leonette Most ([id number]) [id number].pdf Page 4 of 12 Muscle: No Joint: No Bone: No Large (67-100%) Large  (67-100%) N/A Epithelialization: N/A N/A N/A Debridement: N/A N/A N/A Tissue Debrided: N/A N/A N/A Level: N/A N/A N/A Debridement A (sq cm): rea N/A N/A N/A Instrument: N/A N/A N/A Bleeding: Debridement Treatment Response: N/A N/A N/A Post Debridement Measurements L x N/A N/A N/A W x D (cm) N/A N/A N/A Post Debridement Volume: (cm) N/A N/A N/A Procedures Performed: Treatment Notes Electronic Signature(s) Signed: 12/31/2022 10:25:09 AM By: Duanne Guess MD FACS Entered By: Duanne Guess on 12/31/2022 10:25:09 -------------------------------------------------------------------------------- Multi-Disciplinary Care Plan Details Patient Name: Date of Service: TILLIE, VELDKAMP Mcgee 12/31/2022 9:30 A M Medical Record Number: [id number] Patient Account Number: [id number] Date of Birth/Sex: Treating RN: November 02, 1960 (62 y.o. Cline Cools Primary Care Breonna Gafford: Hoy Register Other Clinician: Referring Skylene Deremer: Treating Darriana Deboy/Extender: Camillo Flaming Weeks in Treatment: 2 Active Inactive Wound/Skin Impairment Nursing Diagnoses: Knowledge deficit related to ulceration/compromised skin integrity Goals: Patient/caregiver will verbalize understanding of skin care regimen Date Initiated: 12/17/2022 Target Resolution Date: 01/31/2023 Goal Status: Active Interventions:  Assess patient/caregiver ability to obtain necessary supplies Assess patient/caregiver ability to perform ulcer/skin care regimen upon admission and as needed Assess ulceration(s) every visit Provide education on ulcer and skin care Screen for HBO Treatment Activities: Skin care regimen initiated : 12/17/2022 Topical wound management initiated : 12/17/2022 Notes: Electronic Signature(s) Signed: 12/31/2022 5:03:07 PM By: Redmond Pulling RN, BSN Entered By: Redmond Pulling on 12/31/2022 10:08:55 [name] ([id number]) [id number].pdf Page 5 of  12 -------------------------------------------------------------------------------- Pain Assessment Details Patient Name: Date of Service: AMY, USHER 12/31/2022 9:30 A M Medical Record Number: [id number] Patient Account Number: [id number] Date of Birth/Sex: Treating RN: 05/08/1960 (62 y.o. Cline Cools Primary Care Palma Buster: Hoy Register Other Clinician: Referring Ridgely Anastacio: Treating Rudolfo Brandow/Extender: Camillo Flaming Weeks in Treatment: 2 Active Problems Location of Pain Severity and Description of Pain Patient Has Paino No Site Locations Pain Management and Medication Current Pain Management: Electronic Signature(s) Signed: 12/31/2022 5:03:07 PM By: Redmond Pulling RN, BSN Entered By: Redmond Pulling on 12/31/2022 09:32:30 -------------------------------------------------------------------------------- Patient/Caregiver Education Details Patient Name: Date of Service: Dillon Mcgee 10/30/2024andnbsp9:30 A M Medical Record Number: [id number] Patient Account Number: [id number] Date of Birth/Gender: Treating RN: 03/22/1960 (62 y.o. Cline Cools Primary Care Physician: Hoy Register Other Clinician: Referring Physician: Treating Physician/Extender: Camillo Flaming Weeks in Treatment: 2 Education Assessment Education Provided To: Patient Education Topics Provided Wound/Skin Impairment: Methods: Explain/Verbal Responses: State content correctly Dillon Mcgee, Dillon Mcgee ([id number]) [id number] Page 6 of 12 Electronic Signature(s) Signed: 12/31/2022 5:03:07 PM By: Redmond Pulling RN, BSN Entered By: Redmond Pulling on 12/31/2022 10:09:19 -------------------------------------------------------------------------------- Wound Assessment Details Patient Name: Date of Service: Dillon Mcgee, Dillon Mcgee Mcgee 12/31/2022 9:30 A M Medical Record Number: [id number] Patient Account Number: [id number] Date of Birth/Sex: Treating  RN: 1961-01-29 (62 y.o. Cline Cools Primary Care Cortland Crehan: Hoy Register Other Clinician: Referring Latica Hohmann: Treating Seanmichael Salmons/Extender: Camillo Flaming Weeks in Treatment: 2 Wound Status Wound Number: 1 Primary Etiology: Venous Leg Ulcer Wound Location: Left, Anterior Lower Leg Wound Status: Open Wounding Event: Blister Comorbid History: Hypertension, Type II Diabetes Date Acquired: 09/01/2022 Weeks Of Treatment: 2 Clustered Wound: No Photos Wound Measurements Length: (cm) Width: (cm) Depth: (cm) Area: (cm) Volume: (cm) 0 % Reduction in Area: 100% 0 % Reduction in Volume: 100% 0 Epithelialization: Large (67-100%) 0 Tunneling: No 0 Undermining: No Wound Description Classification: Full Thickness Without Exposed Support Exudate Amount: None Present Structures Foul Odor After Cleansing: No Slough/Fibrino No Wound Bed Granulation Amount: None Present (0%) Exposed Structure Necrotic Amount: None Present (0%) Fascia Exposed: No Fat Layer (Subcutaneous Tissue) Exposed: No Tendon Exposed: No Muscle Exposed: No Joint Exposed: No Bone Exposed: No Periwound Skin Texture Texture Color No Abnormalities Noted: No No Abnormalities Noted: No Moisture No Abnormalities Noted: No Electronic Signature(sBODEN, VALLEAU ([id number]) [id number].pdf Page 7 of 12 Signed: 12/31/2022 5:03:07 PM By: Redmond Pulling RN, BSN Entered By: Redmond Pulling on 12/31/2022 10:07:17 -------------------------------------------------------------------------------- Wound Assessment Details Patient Name: Date of Service: Dillon Mcgee, Dillon Mcgee Mcgee 12/31/2022 9:30 A M Medical Record Number: [id number] Patient Account Number: [id number] Date of Birth/Sex: Treating RN: [birth year] (62 y.o. Cline Cools Primary Care Antoniette Peake: Hoy Register Other Clinician: Referring Kinzie Wickes: Treating Malasha Kleppe/Extender: Camillo Flaming Weeks in Treatment:  2 Wound Status Wound Number: 2 Primary Etiology: Venous Leg Ulcer Wound Location: Left, Lateral Lower Leg Wound Status: Open Wounding Event: Blister Comorbid History: Hypertension, Type II Diabetes Date Acquired: 09/01/2022 Weeks Of Treatment: 2 Clustered Wound: No Photos Wound Measurements Length: (cm) 0.7 Width: (cm) 0.5 Depth: (cm) 0.1 Area: (  cm) 0.275 Volume: (cm) 0.027 % Reduction in Area: 93.7% % Reduction in Volume: 93.9% Epithelialization: Large (67-100%) Tunneling: No Undermining: No Wound Description Classification: Full Thickness Without Exposed Su Exudate Amount: Medium Exudate Type: Serosanguineous Exudate Color: red, brown pport Structures Wound Bed Granulation Amount: Large (67-100%) Exposed Structure Granulation Quality: Red Fat Layer (Subcutaneous Tissue) Exposed: Yes Necrotic Amount: None Present (0%) Periwound Skin Texture Texture Color No Abnormalities Noted: No No Abnormalities Noted: No Moisture No Abnormalities Noted: No Treatment Notes Wound #2 (Lower Leg) Wound Laterality: Left, Lateral Cleanser Soap and Water Discharge Instruction: May shower and wash wound with dial antibacterial soap and water prior to dressing change. LEANDREW, GRAMS ([id number]) [id number] Page 8 of 12 Peri-Wound Care Sween Lotion (Moisturizing lotion) Discharge Instruction: Apply moisturizing lotion as directed Topical Primary Dressing Maxorb Extra Ag+ Alginate Dressing, 4x4.75 (in/in) Discharge Instruction: Apply to wound bed as instructed Secondary Dressing ABD Pad, 8x10 Discharge Instruction: Apply over primary dressing as directed. Woven Gauze Sponge, Non-Sterile 4x4 in Discharge Instruction: Apply over primary dressing as directed. Secured With Compression Wrap Urgo K2 Lite, (equivalent to a 3 layer) two layer compression system, regular Discharge Instruction: Apply Urgo K2 Lite as directed (alternative to 3 layer  compression). Compression Stockings Jobst Farrow Wrap 4000 Quantity: 1 Left Leg Compression Amount: 30-40 mmHg Discharge Instruction: Apply Renee Pain daily as instructed. Apply first thing in the morning, remove at night before bed. Add-Ons Electronic Signature(s) Signed: 12/31/2022 5:03:07 PM By: Redmond Pulling RN, BSN Entered By: Redmond Pulling on 12/31/2022 09:51:54 -------------------------------------------------------------------------------- Wound Assessment Details Patient Name: Date of Service: Dillon Mcgee, Dillon Mcgee Mcgee 12/31/2022 9:30 A M Medical Record Number: [id number] Patient Account Number: [id number] Date of Birth/Sex: Treating RN: 1960/07/05 (62 y.o. Cline Cools Primary Care Moses Odoherty: Hoy Register Other Clinician: Referring Kayveon Lennartz: Treating Eidan Muellner/Extender: Camillo Flaming Weeks in Treatment: 2 Wound Status Wound Number: 3 Primary Etiology: Venous Leg Ulcer Wound Location: Left, Distal, Lateral Lower Leg Wound Status: Open Wounding Event: Blister Comorbid History: Hypertension, Type II Diabetes Date Acquired: 12/15/2022 Weeks Of Treatment: 2 Clustered Wound: No Photos Wound Measurements Ticas, Gean ([id number]) Length: (cm) 0.7 Width: (cm) 0.5 Depth: (cm) 0.1 Area: (cm) 0.275 Volume: (cm) 0.027 [id number] Page 9 of 12 % Reduction in Area: 82.5% % Reduction in Volume: 82.8% Epithelialization: Large (67-100%) Tunneling: No Undermining: No Wound Description Classification: Full Thickness Without Exposed Su Exudate Amount: Medium Exudate Type: Serosanguineous Exudate Color: red, brown pport Structures Wound Bed Granulation Amount: Large (67-100%) Granulation Quality: Red Necrotic Amount: None Present (0%) Periwound Skin Texture Texture Color No Abnormalities Noted: No No Abnormalities Noted: No Moisture No Abnormalities Noted: No Treatment Notes Wound #3 (Lower Leg) Wound Laterality: Left,  Lateral, Distal Cleanser Soap and Water Discharge Instruction: May shower and wash wound with dial antibacterial soap and water prior to dressing change. Peri-Wound Care Sween Lotion (Moisturizing lotion) Discharge Instruction: Apply moisturizing lotion as directed Topical Primary Dressing Maxorb Extra Ag+ Alginate Dressing, 4x4.75 (in/in) Discharge Instruction: Apply to wound bed as instructed Secondary Dressing ABD Pad, 8x10 Discharge Instruction: Apply over primary dressing as directed. Woven Gauze Sponge, Non-Sterile 4x4 in Discharge Instruction: Apply over primary dressing as directed. Secured With Compression Wrap Urgo K2 Lite, (equivalent to a 3 layer) two layer compression system, regular Discharge Instruction: Apply Urgo K2 Lite as directed (alternative to 3 layer compression). Compression Stockings Add-Ons Electronic Signature(s) Signed: 12/31/2022 5:03:07 PM By: Redmond Pulling RN, BSN Entered By: Redmond Pulling on 12/31/2022 10:06:34 -------------------------------------------------------------------------------- Wound Assessment Details Patient Name: Date of  Service: Dillon Mcgee, Dillon Mcgee Mcgee 12/31/2022 9:30 A M Medical Record Number: [id number] Patient Account Number: [id number] Date of Birth/Sex: Treating RN: [birth year] (62 y.o. Claudia Desanctis, [address] [place], [place] ([id number]) [id number].pdf Page 10 of 12 Primary Care Tashonna Descoteaux: Hoy Register Other Clinician: Referring Chaz Ronning: Treating Maxxwell Edgett/Extender: Camillo Flaming Weeks in Treatment: 2 Wound Status Wound Number: 4 Primary Etiology: Venous Leg Ulcer Wound Location: Left, Proximal, Lateral Lower Leg Wound Status: Healed - Epithelialized Wounding Event: Blister Comorbid History: Hypertension, Type II Diabetes Date Acquired: 09/01/2022 Weeks Of Treatment: 2 Clustered Wound: No Photos Wound Measurements Length: (cm) Width: (cm) Depth: (cm) Area: (cm) Volume: (cm) 0 %  Reduction in Area: 100% 0 % Reduction in Volume: 100% 0 Epithelialization: Large (67-100%) 0 Tunneling: No 0 Undermining: No Wound Description Classification: Full Thickness Without Exposed Support Exudate Amount: None Present Structures Foul Odor After Cleansing: No Slough/Fibrino No Wound Bed Exposed Structure Fascia Exposed: No Fat Layer (Subcutaneous Tissue) Exposed: No Tendon Exposed: No Muscle Exposed: No Joint Exposed: No Bone Exposed: No Periwound Skin Texture Texture Color No Abnormalities Noted: No No Abnormalities Noted: No Moisture No Abnormalities Noted: No Electronic Signature(s) Signed: 12/31/2022 5:03:07 PM By: Redmond Pulling RN, BSN Entered By: Redmond Pulling on 12/31/2022 10:08:12 -------------------------------------------------------------------------------- Wound Assessment Details Patient Name: Date of Service: Dillon Mcgee, Dillon Mcgee Mcgee 12/31/2022 9:30 A M Medical Record Number: [id number] Patient Account Number: [id number] Date of Birth/Sex: Treating RN: 09/12/1960 (62 y.o. Cline Cools Primary Care Magali Bray: Hoy Register Other Clinician: Referring Alee Gressman: Treating Seve Monette/Extender: Camillo Flaming Weeks in Treatment: 2 [place], Leonette Most ([id number]) [id number].pdf Page 11 of 12 Wound Status Wound Number: 5 Primary Etiology: Venous Leg Ulcer Wound Location: Right, Lateral Lower Leg Wound Status: Open Wounding Event: Blister Comorbid History: Hypertension, Type II Diabetes Date Acquired: 09/01/2022 Weeks Of Treatment: 2 Clustered Wound: No Photos Wound Measurements Length: (cm) Width: (cm) Depth: (cm) Area: (cm) Volume: (cm) 0 % Reduction in Area: 100% 0 % Reduction in Volume: 100% 0 Epithelialization: Large (67-100%) 0 Tunneling: No 0 Undermining: No Wound Description Classification: Full Thickness Without Exposed Support Exudate Amount: None Present Structures Foul Odor After Cleansing:  No Slough/Fibrino No Wound Bed Granulation Amount: None Present (0%) Necrotic Amount: None Present (0%) Periwound Skin Texture Texture Color No Abnormalities Noted: No No Abnormalities Noted: No Moisture No Abnormalities Noted: No Electronic Signature(s) Signed: 12/31/2022 5:03:07 PM By: Redmond Pulling RN, BSN Entered By: Redmond Pulling on 12/31/2022 09:57:03 -------------------------------------------------------------------------------- Vitals Details Patient Name: Date of Service: Dillon Mcgee, Dillon Mcgee Mcgee 12/31/2022 9:30 A M Medical Record Number: [id number] Patient Account Number: [id number] Date of Birth/Sex: Treating RN: 1960/12/06 (62 y.o. Cline Cools Primary Care Jayland Null: Hoy Register Other Clinician: Referring Phoebie Shad: Treating Kaity Pitstick/Extender: Camillo Flaming Weeks in Treatment: 2 Vital Signs Time Taken: 09:31 Temperature (F): 97.6 Weight (lbs): 360 Pulse (bpm): 60 Respiratory Rate (breaths/min): 18 Blood Pressure (mmHg): 135/77 KURAN, SAUPE ([id number]) [phone].pdf Page 12 of 12 Capillary Blood Glucose (mg/dl): 65 Reference Range: 80 - 120 mg / dl Electronic Signature(s) Signed: 12/31/2022 5:03:07 PM By: Redmond Pulling RN, BSN Entered By: Redmond Pulling on 12/31/2022 09:32:25

## 2023-01-05 ENCOUNTER — Other Ambulatory Visit: Payer: Self-pay

## 2023-01-07 ENCOUNTER — Encounter (HOSPITAL_BASED_OUTPATIENT_CLINIC_OR_DEPARTMENT_OTHER): Payer: Self-pay | Attending: General Surgery | Admitting: General Surgery

## 2023-01-07 DIAGNOSIS — Z6841 Body Mass Index (BMI) 40.0 and over, adult: Secondary | ICD-10-CM | POA: Insufficient documentation

## 2023-01-07 DIAGNOSIS — I872 Venous insufficiency (chronic) (peripheral): Secondary | ICD-10-CM | POA: Insufficient documentation

## 2023-01-07 DIAGNOSIS — L97822 Non-pressure chronic ulcer of other part of left lower leg with fat layer exposed: Secondary | ICD-10-CM | POA: Insufficient documentation

## 2023-01-07 DIAGNOSIS — W2203XA Walked into furniture, initial encounter: Secondary | ICD-10-CM | POA: Insufficient documentation

## 2023-01-07 DIAGNOSIS — E11621 Type 2 diabetes mellitus with foot ulcer: Secondary | ICD-10-CM | POA: Insufficient documentation

## 2023-01-07 DIAGNOSIS — L97812 Non-pressure chronic ulcer of other part of right lower leg with fat layer exposed: Secondary | ICD-10-CM | POA: Insufficient documentation

## 2023-01-07 NOTE — Progress Notes (Addendum)
Dillon Mcgee, Dillon Mcgee ([id number]) [id number] Page 1 of 6 Visit Report for 01/07/2023 Chief Complaint Document Details Patient Name: Date of Service: Dillon Mcgee, Dillon Mcgee 01/07/2023 9:30 A M Medical Record Number: [id number] Patient Account Number: [id number] Date of Birth/Sex: Treating RN: 06-21-1960 (62 y.o. M) Primary Care Provider: Hoy Register Other Clinician: Referring Provider: Treating Provider/Extender: Camillo Flaming Weeks in Treatment: 3 Information Obtained from: Patient Chief Complaint Patients presents for treatment of open diabetic ulcers of the left and right leg in the setting of chronic venous insufficiency. Electronic Signature(s) Signed: 01/07/2023 9:29:33 AM By: Duanne Guess MD FACS Entered By: Duanne Guess on 01/07/2023 06:29:33 -------------------------------------------------------------------------------- HPI Details Patient Name: Date of Service: Dillon Mcgee, Dillon Mcgee 01/07/2023 9:30 A M Medical Record Number: [id number] Patient Account Number: [id number] Date of Birth/Sex: Treating RN: 04-25-1960 (62 y.o. M) Primary Care Provider: Hoy Register Other Clinician: Referring Provider: Treating Provider/Extender: Camillo Flaming Weeks in Treatment: 3 History of Present Illness HPI Description: ADMISSION 12/17/2022 ***ABIs R: 1.07; L: 1.01*** This is a 62 year old type II diabetic (last hemoglobin A1c 7.6%) who struck his leg on the corner of his bed in mid August. He was seen at his PCPs office and given a course of antibiotics (Bactrim) due to swelling and purulent drainage from the site. A month later, the wound still had not healed and his primary care doctor referred him to wound care center for further evaluation and management. A course of doxycycline was prescribed at that time. He says he is still taking the antibiotic. 12/31/2022: Most of his wounds have healed. There are 2 small open ulcers  on his lateral left leg that have a little bit of eschar on them. He did not communicate with his insurance program regarding coverage for compression garments. 01/07/2023: His wounds are healed. He has contact information for [facility] and just needs his leg measurements to get his Farrow wraps. He says he will be able to get these on Friday after he gets paid. Electronic Signature(s) Signed: 01/07/2023 9:30:20 AM By: Duanne Guess MD FACS Entered By: Duanne Guess on 01/07/2023 06:30:20 [name] ([id number]) [id number] Page 2 of 6 -------------------------------------------------------------------------------- Physical Exam Details Patient Name: Date of Service: Dillon Mcgee, Dillon Mcgee 01/07/2023 9:30 A M Medical Record Number: [id number] Patient Account Number: [id number] Date of Birth/Sex: Treating RN: [birth year] (62 y.o. M) Primary Care Provider: Hoy Register Other Clinician: Referring Provider: Treating Provider/Extender: Whitney Muse, [name] Weeks in Treatment: 3 Constitutional . . . . no acute distress. Respiratory Normal work of breathing on room air.. Notes 01/07/2023: His wounds are healed. Electronic Signature(s) Signed: 01/07/2023 9:30:52 AM By: Duanne Guess MD FACS Entered By: Duanne Guess on 01/07/2023 06:30:52 -------------------------------------------------------------------------------- Physician Orders Details Patient Name: Date of Service: Dillon Mcgee, Dillon Mcgee 01/07/2023 9:30 A M Medical Record Number: [id number] Patient Account Number: [id number] Date of Birth/Sex: Treating RN: [birth year] (62 y.o. Cline Cools Primary Care Provider: Hoy Register Other Clinician: Referring Provider: Treating Provider/Extender: Camillo Flaming Weeks in Treatment: 3 Verbal / Phone Orders: No Diagnosis Coding ICD-10 Coding Code Description [phone] Non-pressure chronic ulcer of other part of left lower leg  with fat layer exposed L97.812 Non-pressure chronic ulcer of other part of right lower leg with fat layer exposed E11.622 Type 2 diabetes mellitus with other skin ulcer I87.2 Venous insufficiency (chronic) (peripheral) E66.01 Morbid (severe) obesity due to excess calories Follow-up Appointments Other: - Be sure to order farrow wraps from [facility] Discharge From [facility] Services Discharge from Wound Care Center - Congratulations on  your wound healing Bathing/ Shower/ Hygiene May shower and wash wound with soap and water. Additional Orders / Instructions Follow Nutritious Diet - 70-100g of protein per day. Sources of protein include lean chicken, fish, [place], nuts, tofu, protein shakes, eggs, [place] yogurt, etc. Electronic Signature(s) Signed: 01/07/2023 9:31:54 AM By: Duanne Guess MD FACS Entered By: Duanne Guess on 01/07/2023 06:31:53 [name] ([id number]) [id number] Page 3 of 6 -------------------------------------------------------------------------------- Problem List Details Patient Name: Date of Service: Dillon Mcgee, Dillon Mcgee 01/07/2023 9:30 A M Medical Record Number: [id number] Patient Account Number: [id number] Date of Birth/Sex: Treating RN: 11/18/1960 (62 y.o. M) Primary Care Provider: Hoy Register Other Clinician: Referring Provider: Treating Provider/Extender: Camillo Flaming Weeks in Treatment: 3 Active Problems ICD-10 Encounter Code Description Active Date MDM Diagnosis L97.822 Non-pressure chronic ulcer of other part of left lower leg with fat layer 12/17/2022 No Yes exposed L97.812 Non-pressure chronic ulcer of other part of right lower leg with fat layer 12/17/2022 No Yes exposed E11.622 Type 2 diabetes mellitus with other skin ulcer 12/17/2022 No Yes I87.2 Venous insufficiency (chronic) (peripheral) 12/17/2022 No Yes E66.01 Morbid (severe) obesity due to excess calories 12/17/2022 No Yes Inactive  Problems Resolved Problems Electronic Signature(s) Signed: 01/07/2023 9:28:55 AM By: Duanne Guess MD FACS Entered By: Duanne Guess on 01/07/2023 06:28:55 -------------------------------------------------------------------------------- Progress Note Details Patient Name: Date of Service: Dillon Mcgee, Dillon Mcgee 01/07/2023 9:30 A M Medical Record Number: [id number] Patient Account Number: [id number] Date of Birth/Sex: Treating RN: 1960-04-18 (62 y.o. M) Primary Care Provider: Hoy Register Other Clinician: Referring Provider: Treating Provider/Extender: Camillo Flaming Weeks in Treatment: 3 Subjective Chief Complaint Information obtained from Patient Patients presents for treatment of open diabetic ulcers of the left and right leg in the setting of chronic venous insufficiency. Dillon Mcgee, Dillon Mcgee ([id number]) [id number] Page 4 of 6 History of Present Illness (HPI) ADMISSION 12/17/2022 ***ABIs R: 1.07; L: 1.01*** This is a 62 year old type II diabetic (last hemoglobin A1c 7.6%) who struck his leg on the corner of his bed in mid August. He was seen at his PCPs office and given a course of antibiotics (Bactrim) due to swelling and purulent drainage from the site. A month later, the wound still had not healed and his primary care doctor referred him to wound care center for further evaluation and management. A course of doxycycline was prescribed at that time. He says he is still taking the antibiotic. 12/31/2022: Most of his wounds have healed. There are 2 small open ulcers on his lateral left leg that have a little bit of eschar on them. He did not communicate with his insurance program regarding coverage for compression garments. 01/07/2023: His wounds are healed. He has contact information for [facility] and just needs his leg measurements to get his Farrow wraps. He says he will be able to get these on Friday after he gets paid. Patient  History Information obtained from Chart. Family History Diabetes, Heart Disease. Social History Never smoker, Marital Status - Single, Alcohol Use - Never, Drug Use - No History, Caffeine Use - Rarely. Medical History Cardiovascular Patient has history of Hypertension Endocrine Patient has history of Type II Diabetes Hospitalization/Surgery History - colonoscopy. Medical A Surgical History Notes nd Musculoskeletal Degenerative joint disease Objective Constitutional no acute distress. Vitals Time Taken: 9:11 AM, Height: 75 in, Weight: 360 lbs, BMI: 45, Temperature: 97.6 F, Pulse: 79 bpm, Respiratory Rate: 18 breaths/min, Blood Pressure: 130/73 mmHg, Capillary Blood Glucose: 124 mg/dl. Respiratory Normal work of breathing on room air.. General Notes: 01/07/2023: His  wounds are healed. Integumentary (Hair, Skin) Wound #2 status is Open. Original cause of wound was Blister. The date acquired was: 09/01/2022. The wound has been in treatment 3 weeks. The wound is located on the Left,Lateral Lower Leg. The wound measures 0cm length x 0cm width x 0cm depth; 0cm^2 area and 0cm^3 volume. There is no tunneling or undermining noted. There is a none present amount of drainage noted. There is no granulation within the wound bed. There is no necrotic tissue within the wound bed. Wound #3 status is Open. Original cause of wound was Blister. The date acquired was: 12/15/2022. The wound has been in treatment 3 weeks. The wound is located on the Left,Distal,Lateral Lower Leg. The wound measures 0cm length x 0cm width x 0cm depth; 0cm^2 area and 0cm^3 volume. There is no tunneling or undermining noted. There is a none present amount of drainage noted. There is no granulation within the wound bed. There is no necrotic tissue within the wound bed. Assessment Active Problems ICD-10 Non-pressure chronic ulcer of other part of left lower leg with fat layer exposed Non-pressure chronic ulcer of other part of  right lower leg with fat layer exposed Type 2 diabetes mellitus with other skin ulcer Venous insufficiency (chronic) (peripheral) Morbid (severe) obesity due to excess calories [name] ([id number]) [id number] Page 5 of 6 Plan Follow-up Appointments: Other: - Be sure to order farrow wraps from [facility]charge From [facility] Services: Discharge from Wound Care Center - Congratulations on your wound healing Bathing/ Shower/ Hygiene: May shower and wash wound with soap and water. Additional Orders / Instructions: Follow Nutritious Diet - 70-100g of protein per day. Sources of protein include lean chicken, fish, [place], nuts, tofu, protein shakes, eggs, [place] yogurt, etc. 01/07/2023: His wounds are healed. He has contact information for [facility] and just needs his leg measurements to get his [place] wraps. He says he will be able to get these on Friday after he gets paid. We measured his legs so that he can provide the data to [facility] and get appropriate compression garments. We will discharge him from the wound care center. He will follow-up in the future, should the need arise. Electronic Signature(s) Signed: 01/07/2023 9:32:30 AM By: Duanne Guess MD FACS Entered By: Duanne Guess on 01/07/2023 06:32:30 -------------------------------------------------------------------------------- HxROS Details Patient Name: Date of Service: Dillon Mcgee, Dillon Mcgee 01/07/2023 9:30 A M Medical Record Number: [id number] Patient Account Number: [id number] Date of Birth/Sex: Treating RN: 21-Oct-1960 (62 y.o. M) Primary Care Provider: Hoy Register Other Clinician: Referring Provider: Treating Provider/Extender: Camillo Flaming Weeks in Treatment: 3 Information Obtained From Chart Cardiovascular Medical History: Positive for: Hypertension Endocrine Medical History: Positive for: Type II Diabetes Musculoskeletal Medical History: Past Medical History  Notes: Degenerative joint disease Immunizations Pneumococcal Vaccine: Received Pneumococcal Vaccination: No Implantable Devices No devices added Hospitalization / Surgery History Type of Hospitalization/Surgery colonoscopy Dillon Mcgee, Dillon Mcgee ([id number]) [id number] Page 6 of 6 Family and Social History Diabetes: Yes; Heart Disease: Yes; Never smoker; Marital Status - Single; Alcohol Use: Never; Drug Use: No History; Caffeine Use: Rarely; Financial Concerns: No; Food, Clothing or Shelter Needs: No; Support System Lacking: No; Transportation Concerns: No Electronic Signature(s) Signed: 01/07/2023 9:35:44 AM By: Duanne Guess MD FACS Entered By: Duanne Guess on 01/07/2023 06:30:27 -------------------------------------------------------------------------------- SuperBill Details Patient Name: Date of Service: TRAPPER, MEECH Mcgee 01/07/2023 Medical Record Number: [id number] Patient Account Number: [id number] Date of Birth/Sex: Treating RN: [birth year] (62 y.o. M) Primary Care Provider: Hoy Register Other Clinician: Referring Provider: Treating Provider/Extender: Duanne Guess  Newlin, [name] Weeks in Treatment: 3 Diagnosis Coding ICD-10 Codes Code Description [phone] Non-pressure chronic ulcer of other part of left lower leg with fat layer exposed L97.812 Non-pressure chronic ulcer of other part of right lower leg with fat layer exposed E11.622 Type 2 diabetes mellitus with other skin ulcer I87.2 Venous insufficiency (chronic) (peripheral) E66.01 Morbid (severe) obesity due to excess calories Facility Procedures : CPT4 Code: [id number] Description: 99213 - WOUND CARE VISIT-LEV 3 EST PT Modifier: Quantity: 1 Physician Procedures : CPT4 Code Description Modifier [id number] - WC PHYS LEVEL 3 - EST PT ICD-10 Diagnosis Description L97.822 Non-pressure chronic ulcer of other part of left lower leg with fat layer exposed L97.812 Non-pressure chronic  ulcer of other part of right  lower leg with fat layer exposed E11.622 Type 2 diabetes mellitus with other skin ulcer I87.2 Venous insufficiency (chronic) (peripheral) Quantity: 1 Electronic Signature(s) Signed: 01/07/2023 10:15:10 AM By: Duanne Guess MD FACS Signed: 01/07/2023 3:58:55 PM By: Redmond Pulling RN, BSN Previous Signature: 01/07/2023 9:32:45 AM Version By: Duanne Guess MD FACS Entered By: Redmond Pulling on 01/07/2023 06:36:59

## 2023-01-07 NOTE — Progress Notes (Addendum)
KIREE, DEJARNETTE ([id number]) [id number] Page 1 of 9 Visit Report for 01/07/2023 Arrival Information Details Patient Name: Date of Service: Dillon Mcgee, Dillon Mcgee 01/07/2023 9:30 A M Medical Record Number: [id number] Patient Account Number: [id number] Date of Birth/Sex: Treating RN: 1960/08/29 (62 y.o. M) Primary Care Makia Bossi: Hoy Register Other Clinician: Referring Takahiro Godinho: Treating Ellese Julius/Extender: Camillo Flaming Weeks in Treatment: 3 Visit Information History Since Last Visit Added or deleted any medications: No Patient Arrived: Ambulatory Any new allergies or adverse reactions: No Arrival Time: 09:11 Had a fall or experienced change in No Accompanied By: self activities of daily living that may affect Transfer Assistance: None risk of falls: Patient Identification Verified: Yes Signs or symptoms of abuse/neglect since last visito No Secondary Verification Process Completed: Yes Hospitalized since last visit: No Patient Requires Transmission-Based Precautions: No Implantable device outside of the clinic excluding No Patient Has Alerts: No cellular tissue based products placed in the center since last visit: Pain Present Now: No Electronic Signature(s) Signed: 01/07/2023 9:19:38 AM By: Dayton Scrape Entered By: Dayton Scrape on 01/07/2023 06:11:25 -------------------------------------------------------------------------------- Clinic Level of Care Assessment Details Patient Name: Date of Service: Dillon Mcgee, Dillon Mcgee 01/07/2023 9:30 A M Medical Record Number: [id number] Patient Account Number: [id number] Date of Birth/Sex: Treating RN: [birth year] (62 y.o. Cline Cools Primary Care Mart Colpitts: Hoy Register Other Clinician: Referring Nelline Lio: Treating Linnet Bottari/Extender: Camillo Flaming Weeks in Treatment: 3 Clinic Level of Care Assessment Items TOOL 4 Quantity Score X- 1 0 Use when only an EandM is performed on  FOLLOW-UP visit ASSESSMENTS - Nursing Assessment / Reassessment X- 1 10 Reassessment of Co-morbidities (includes updates in patient status) X- 1 5 Reassessment of Adherence to Treatment Plan ASSESSMENTS - Wound and Skin A ssessment / Reassessment []  - 0 Simple Wound Assessment / Reassessment - one wound X- 2 5 Complex Wound Assessment / Reassessment - multiple wounds []  - 0 Dermatologic / Skin Assessment (not related to wound area) ASSESSMENTS - Focused Assessment X- 1 5 Circumferential Edema Measurements - multi extremities []  - 0 Nutritional Assessment / Counseling / Intervention []  - 0 Lower Extremity Assessment (monofilament, tuning fork, pulses) Turman, Bentlie ([id number]) [id number] Page 2 of 9 []  - 0 Peripheral Arterial Disease Assessment (using hand held doppler) ASSESSMENTS - Ostomy and/or Continence Assessment and Care []  - 0 Incontinence Assessment and Management []  - 0 Ostomy Care Assessment and Management (repouching, etc.) PROCESS - Coordination of Care []  - 0 Simple Patient / Family Education for ongoing care []  - 0 Complex (extensive) Patient / Family Education for ongoing care X- 1 10 Staff obtains Chiropractor, Records, T Results / Process Orders est []  - 0 Staff telephones HHA, Nursing Homes / Clarify orders / etc []  - 0 Routine Transfer to another Facility (non-emergent condition) []  - 0 Routine Hospital Admission (non-emergent condition) []  - 0 New Admissions / Manufacturing engineer / Ordering NPWT Apligraf, etc. , []  - 0 Emergency Hospital Admission (emergent condition) X- 1 10 Simple Discharge Coordination []  - 0 Complex (extensive) Discharge Coordination PROCESS - Special Needs []  - 0 Pediatric / Minor Patient Management []  - 0 Isolation Patient Management []  - 0 Hearing / Language / Visual special needs []  - 0 Assessment of Community assistance (transportation, D/C planning, etc.) []  - 0 Additional  assistance / Altered mentation []  - 0 Support Surface(s) Assessment (bed, cushion, seat, etc.) INTERVENTIONS - Wound Cleansing / Measurement []  - 0 Simple Wound Cleansing - one wound X- 2 5 Complex Wound Cleansing - multiple wounds X- 1  5 Wound Imaging (photographs - any number of wounds) []  - 0 Wound Tracing (instead of photographs) []  - 0 Simple Wound Measurement - one wound X- 2 5 Complex Wound Measurement - multiple wounds INTERVENTIONS - Wound Dressings []  - 0 Small Wound Dressing one or multiple wounds []  - 0 Medium Wound Dressing one or multiple wounds []  - 0 Large Wound Dressing one or multiple wounds []  - 0 Application of Medications - topical []  - 0 Application of Medications - injection INTERVENTIONS - Miscellaneous []  - 0 External ear exam []  - 0 Specimen Collection (cultures, biopsies, blood, body fluids, etc.) []  - 0 Specimen(s) / Culture(s) sent or taken to Lab for analysis []  - 0 Patient Transfer (multiple staff / Nurse, adult / Similar devices) []  - 0 Simple Staple / Suture removal (25 or less) []  - 0 Complex Staple / Suture removal (26 or more) []  - 0 Hypo / Hyperglycemic Management (close monitor of Blood Glucose) []  - 0 Ankle / Brachial Index (ABI) - do not check if billed separately ANNA, LIVERS ([id number]) [id number] Page 3 of 9 X- 1 5 Vital Signs Has the patient been seen at the hospital within the last three years: Yes Total Score: 80 Level Of Care: New/Established - Level 3 Electronic Signature(s) Signed: 01/07/2023 3:58:55 PM By: Redmond Pulling RN, BSN Entered By: Redmond Pulling on 01/07/2023 06:36:50 -------------------------------------------------------------------------------- Encounter Discharge Information Details Patient Name: Date of Service: TARIG, ZIMMERS Mcgee 01/07/2023 9:30 A M Medical Record Number: [id number] Patient Account Number: [id number] Date of Birth/Sex: Treating RN: [birth year] (62 y.o.  Cline Cools Primary Care Marny Smethers: Hoy Register Other Clinician: Referring Khilee Hendricksen: Treating Jeiden Daughtridge/Extender: Camillo Flaming Weeks in Treatment: 3 Encounter Discharge Information Items Discharge Condition: Stable Ambulatory Status: Ambulatory Discharge Destination: Home Transportation: Private Auto Accompanied By: self Schedule Follow-up Appointment: Yes Clinical Summary of Care: Patient Declined Electronic Signature(s) Signed: 01/07/2023 3:58:55 PM By: Redmond Pulling RN, BSN Entered By: Redmond Pulling on 01/07/2023 06:37:36 -------------------------------------------------------------------------------- Lower Extremity Assessment Details Patient Name: Date of Service: Dillon Mcgee, Dillon Mcgee 01/07/2023 9:30 A M Medical Record Number: [id number] Patient Account Number: [id number] Date of Birth/Sex: Treating RN: Sep 13, 1960 (62 y.o. Cline Cools Primary Care Camaryn Lumbert: Hoy Register Other Clinician: Referring Elmin Wiederholt: Treating Maleigh Bagot/Extender: Camillo Flaming Weeks in Treatment: 3 Edema Assessment Assessed: [Left: No] [Right: No] [Left: Edema] [Right: :] Calf Left: Right: Point of Measurement: 38 cm From Medial Instep 43 cm 43 cm Ankle Left: Right: Point of Measurement: 11 cm From Medial Instep 24 cm 23.5 cm Knee To Floor Left: Right: From Medial Instep 50 cm 50 cm Chavarria, Nickey ([id number]) [id number] Page 4 of 9 Vascular Assessment Pulses: Dorsalis Pedis Palpable: [Right:Yes] Extremity colors, hair growth, and conditions: Extremity Color: [Left:Normal] [Right:Normal] Hair Growth on Extremity: [Left:Yes] [Right:Yes] Temperature of Extremity: [Left:Warm] [Right:Warm] Capillary Refill: [Left:< 3 seconds] [Right:< 3 seconds] Dependent Rubor: [Left:No No] [Right:No No] Electronic Signature(s) Signed: 01/07/2023 3:58:55 PM By: Redmond Pulling RN, BSN Entered By: Redmond Pulling on 01/07/2023  06:32:09 -------------------------------------------------------------------------------- Multi Wound Chart Details Patient Name: Date of Service: JOSEMIGUEL, GRIES Mcgee 01/07/2023 9:30 A M Medical Record Number: [id number] Patient Account Number: [id number] Date of Birth/Sex: Treating RN: 1960-03-26 (62 y.o. M) Primary Care Daniyla Pfahler: Hoy Register Other Clinician: Referring Onofre Gains: Treating Deneise Getty/Extender: Camillo Flaming Weeks in Treatment: 3 Vital Signs Height(in): 75 Capillary Blood Glucose(mg/dl): 093 Weight(lbs): 235 Pulse(bpm): 79 Body Mass Index(BMI): 45 Blood Pressure(mmHg): 130/73 Temperature(F): 97.6 Respiratory Rate(breaths/min): 18 [2:Photos:] [N/A:N/A] Left, Lateral Lower Leg Left, Distal,  Lateral Lower Leg N/A Wound Location: Blister Blister N/A Wounding Event: Venous Leg Ulcer Venous Leg Ulcer N/A Primary Etiology: Hypertension, Type II Diabetes Hypertension, Type II Diabetes N/A Comorbid History: 09/01/2022 12/15/2022 N/A Date Acquired: 3 3 N/A Weeks of Treatment: Open Open N/A Wound Status: No No N/A Wound Recurrence: 0x0x0 0x0x0 N/A Measurements L x W x D (cm) 0 0 N/A A (cm) : rea 0 0 N/A Volume (cm) : 100.00% 100.00% N/A % Reduction in A rea: 100.00% 100.00% N/A % Reduction in Volume: Full Thickness Without Exposed Full Thickness Without Exposed N/A Classification: Support Structures Support Structures None Present None Present N/A Exudate Amount: None Present (0%) None Present (0%) N/A Granulation Amount: None Present (0%) None Present (0%) N/A Necrotic Amount: Fat Layer (Subcutaneous Tissue): No Fascia: No N/A Exposed Structures: Fat Layer (Subcutaneous Tissue): No Tendon: No Muscle: No Joint: No Bone: No RONON, FERGER ([id number]) [id number] Page 5 of 9 Large (67-100%) None N/A Epithelialization: Treatment Notes Electronic Signature(s) Signed: 01/07/2023 9:29:02 AM By: Duanne Guess MD FACS Entered By: Duanne Guess on 01/07/2023 06:29:02 -------------------------------------------------------------------------------- Multi-Disciplinary Care Plan Details Patient Name: Date of Service: DEMETRIOS, BYRON Mcgee 01/07/2023 9:30 A M Medical Record Number: [id number] Patient Account Number: [id number] Date of Birth/Sex: Treating RN: August 19, 1960 (62 y.o. Cline Cools Primary Care Ramiah Helfrich: Hoy Register Other Clinician: Referring Cainen Burnham: Treating Ellajane Stong/Extender: Camillo Flaming Weeks in Treatment: 3 Active Inactive Electronic Signature(s) Signed: 01/07/2023 3:58:55 PM By: Redmond Pulling RN, BSN Entered By: Redmond Pulling on 01/07/2023 06:24:57 -------------------------------------------------------------------------------- Pain Assessment Details Patient Name: Date of Service: Dillon Mcgee, Dillon Mcgee 01/07/2023 9:30 A M Medical Record Number: [id number] Patient Account Number: [id number] Date of Birth/Sex: Treating RN: May 08, 1960 (62 y.o. M) Primary Care Novak Stgermaine: Hoy Register Other Clinician: Referring Semir Brill: Treating Mehkai Gallo/Extender: Camillo Flaming Weeks in Treatment: 3 Active Problems Location of Pain Severity and Description of Pain Patient Has Paino No Site Locations [place], [place] ([id number]) [id number] Page 6 of 9 Pain Management and Medication Current Pain Management: Electronic Signature(s) Signed: 01/07/2023 9:19:38 AM By: Dayton Scrape Entered By: Dayton Scrape on 01/07/2023 06:12:15 -------------------------------------------------------------------------------- Patient/Caregiver Education Details Patient Name: Date of Service: ZYION, DOXTATER Mcgee 11/6/2024andnbsp9:30 A M Medical Record Number: [id number] Patient Account Number: [id number] Date of Birth/Gender: Treating RN: 10/30/1960 (62 y.o. Cline Cools Primary Care Physician: Hoy Register Other Clinician: Referring  Physician: Treating Physician/Extender: Camillo Flaming Weeks in Treatment: 3 Education Assessment Education Provided To: Patient Education Topics Provided Wound/Skin Impairment: Methods: Explain/Verbal Responses: State content correctly Electronic Signature(s) Signed: 01/07/2023 3:58:55 PM By: Redmond Pulling RN, BSN Entered By: Redmond Pulling on 01/07/2023 06:30:16 -------------------------------------------------------------------------------- Wound Assessment Details Patient Name: Date of Service: Dillon Mcgee, Dillon Mcgee 01/07/2023 9:30 A M Medical Record Number: [id number] Patient Account Number: [id number] Date of Birth/Sex: Treating RN: 1961/02/18 (62 y.o. M) Primary Care Dorrie Cocuzza: Hoy Register Other Clinician: Referring Miata Culbreth: Treating Monicka Cyran/Extender: Whitney Muse, Odette Horns Weeks in Treatment: 3 Wound Status Wound Number: 2 Primary Etiology: Venous Leg Ulcer Wound Location: Left, Lateral Lower Leg Wound Status: Open Wounding Event: Blister Comorbid History: Hypertension, Type II Diabetes Date Acquired: 09/01/2022 Weeks Of Treatment: 3 Clustered Wound: No Photos Dillon Mcgee, Dillon Mcgee ([id number]) [id number] Page 7 of 9 Wound Measurements Length: (cm) Width: (cm) Depth: (cm) Area: (cm) Volume: (cm) 0 % Reduction in Area: 100% 0 % Reduction in Volume: 100% 0 Epithelialization: Large (67-100%) 0 Tunneling: No 0 Undermining: No Wound Description Classification: Full Thickness Without Exposed Support Exudate Amount: None Present Structures Foul Odor After Cleansing: No  Slough/Fibrino No Wound Bed Granulation Amount: None Present (0%) Exposed Structure Necrotic Amount: None Present (0%) Fat Layer (Subcutaneous Tissue) Exposed: No Periwound Skin Texture Texture Color No Abnormalities Noted: No No Abnormalities Noted: No Moisture No Abnormalities Noted: No Electronic Signature(s) Signed: 01/07/2023 3:58:55 PM  By: Redmond Pulling RN, BSN Previous Signature: 01/07/2023 9:19:38 AM Version By: Dayton Scrape Entered By: Redmond Pulling on 01/07/2023 06:24:04 -------------------------------------------------------------------------------- Wound Assessment Details Patient Name: Date of Service: Dillon Mcgee, Dillon Mcgee 01/07/2023 9:30 A M Medical Record Number: [id number] Patient Account Number: [id number] Date of Birth/Sex: Treating RN: 10/31/1960 (62 y.o. M) Primary Care Stokes Rattigan: Hoy Register Other Clinician: Referring Elonna Mcfarlane: Treating Eeva Schlosser/Extender: Camillo Flaming Weeks in Treatment: 3 Wound Status Wound Number: 3 Primary Etiology: Venous Leg Ulcer Wound Location: Left, Distal, Lateral Lower Leg Wound Status: Open Wounding Event: Blister Comorbid History: Hypertension, Type II Diabetes Date Acquired: 12/15/2022 Weeks Of Treatment: 3 Clustered Wound: No Photos AZAVIER, CRESON ([id number]) [id number] Page 8 of 9 Wound Measurements Length: (cm) Width: (cm) Depth: (cm) Area: (cm) Volume: (cm) 0 % Reduction in Area: 100% 0 % Reduction in Volume: 100% 0 Epithelialization: None 0 Tunneling: No 0 Undermining: No Wound Description Classification: Full Thickness Without Exposed Support Structures Exudate Amount: None Present Foul Odor After Cleansing: No Slough/Fibrino No Wound Bed Granulation Amount: None Present (0%) Exposed Structure Necrotic Amount: None Present (0%) Fascia Exposed: No Fat Layer (Subcutaneous Tissue) Exposed: No Tendon Exposed: No Muscle Exposed: No Joint Exposed: No Bone Exposed: No Periwound Skin Texture Texture Color No Abnormalities Noted: No No Abnormalities Noted: No Moisture No Abnormalities Noted: No Electronic Signature(s) Signed: 01/07/2023 3:58:55 PM By: Redmond Pulling RN, BSN Previous Signature: 01/07/2023 9:19:38 AM Version By: Dayton Scrape Entered By: Redmond Pulling on 01/07/2023  06:24:39 -------------------------------------------------------------------------------- Vitals Details Patient Name: Date of Service: Dillon Mcgee, Dillon Mcgee 01/07/2023 9:30 A M Medical Record Number: [id number] Patient Account Number: [id number] Date of Birth/Sex: Treating RN: 18-Nov-1960 (62 y.o. M) Primary Care Kierra Jezewski: Hoy Register Other Clinician: Referring Gibbs Naugle: Treating Nnamdi Dacus/Extender: Camillo Flaming Weeks in Treatment: 3 Vital Signs Time Taken: 09:11 Temperature (F): 97.6 Height (in): 75 Pulse (bpm): 79 Weight (lbs): 360 Respiratory Rate (breaths/min): 18 Body Mass Index (BMI): 45 Blood Pressure (mmHg): 130/73 Capillary Blood Glucose (mg/dl): 413 Reference Range: 80 - 120 mg / dl Electronic Signature(s) Signed: 01/07/2023 9:19:38 AM By: Oliver Barre, Leonette Most ([id number]) AM By: Daryll Drown.pdf Page 9 of 9 Signed: 01/07/2023 9:19:38 Entered By: Dayton Scrape on 01/07/2023 06:12:10

## 2023-01-12 ENCOUNTER — Other Ambulatory Visit: Payer: Self-pay

## 2023-01-13 NOTE — Progress Notes (Unsigned)
Cardiology Office Note:    Date:  01/14/2023   ID:  [name], DOB [birth year], MRN [id number]  PCP:  Hoy Register, MD  Cardiologist:  Little Ishikawa, MD  Electrophysiologist:  None   Referring MD: Hoy Register, MD   Chief Complaint  Patient presents with   Chest Pain    History of Present Illness:    Dillon Mcgee is a 62 y.o. male with a hx of hypertension, hyperlipidemia, T2DM who is referred by Dr. Alvis Lemmings for evaluation of hyperlipidemia.  Tried on multiple statins but discontinued due to myalgias.  Does report has been having intermittent chest pains, occurs about once per week on right side.  Describes dull aching pain, states that can occur when doing something active.  Lasts short duration.  He does not exercise but is active at work at [employer].  Reports occasional shortness of breath with exertion.  He denies any lightheadedness or syncope.  Reports has persistent tightness in his legs and feels heaviness in legs with walking.  No smoking history.  No known family history of heart disease.  Echocardiogram 08/2016 showed EF 55 to 60%, grade 1 diastolic dysfunction, no significant valvular disease.  Past Medical History:  Diagnosis Date   Diabetes mellitus without complication (HCC)    Hyperlipidemia    Hypertension     Past Surgical History:  Procedure Laterality Date   COLONOSCOPY     5 years ago     Current Medications: Current Meds  Medication Sig   amLODipine (NORVASC) 5 MG tablet Take 1 tablet (5 mg total) by mouth daily.   aspirin EC 81 MG tablet Take 1 tablet (81 mg total) daily by mouth.   Blood Glucose Monitoring Suppl (TRUE METRIX GO GLUCOSE METER) w/Device KIT 1 each by Does not apply route every 8 (eight) hours as needed.   cholecalciferol (VITAMIN D3) 25 MCG (1000 UNIT) tablet Take 1,000 Units by mouth daily.   empagliflozin (JARDIANCE) 25 MG TABS tablet Take 1 tablet (25 mg total) by mouth daily before breakfast.   ezetimibe  (ZETIA) 10 MG tablet Take 1 tablet (10 mg total) by mouth daily.   ferrous sulfate 325 (65 FE) MG tablet Take 325 mg by mouth daily with breakfast.   finasteride (PROSCAR) 5 MG tablet Take 1 tablet (5 mg total) by mouth daily.   gabapentin (NEURONTIN) 300 MG capsule Take 1 capsule (300 mg total) by mouth 3 (three) times daily.   glipiZIDE (GLUCOTROL) 10 MG tablet Take 1 tablet (10 mg total) by mouth in the morning AND 0.5 tablets (5 mg total) every evening.   glucose blood (TRUE METRIX BLOOD GLUCOSE TEST) test strip Use as instructed 3 times daily.   Insulin Glargine (BASAGLAR KWIKPEN) 100 UNIT/ML Inject 40 Units into the skin 2 (two) times daily.   Insulin Pen Needle (TRUEPLUS PEN NEEDLES) 32G X 4 MM MISC use as directed to inject insulin twice daily.   Insulin Syringe-Needle U-100 (TRUEPLUS INSULIN SYRINGE) 30G X 5/16" 0.5 ML MISC Use as directed to inject Levemir twice daily.   meloxicam (MOBIC) 15 MG tablet Take 1 tablet (15 mg total) by mouth daily.   omega-3 acid ethyl esters (LOVAZA) 1 g capsule TAKE 1 CAPSULE BY MOUTH 2 TIMES DAILY.   tamsulosin (FLOMAX) 0.4 MG CAPS capsule Take 1 capsule (0.4 mg total) by mouth daily.   TRUEPLUS LANCETS 26G MISC 1 each by Does not apply route every 8 (eight) hours as needed.   TRULICITY 4.5 MG/0.5ML SOPN  INJECT 4.5MG  (0.5ML) UNDER THE SKIN ONCE A WEEK     Allergies:   Lisinopril and Pioglitazone   Social History   Socioeconomic History   Marital status: Single    Spouse name: Not on file   Number of children: Not on file   Years of education: Not on file   Highest education level: Not on file  Occupational History   Not on file  Tobacco Use   Smoking status: Never   Smokeless tobacco: Never  Vaping Use   Vaping status: Never Used  Substance and Sexual Activity   Alcohol use: No    Alcohol/week: 0.0 standard drinks of alcohol   Drug use: Not on file   Sexual activity: Not on file  Other Topics Concern   Not on file  Social History  Narrative   Not on file   Social Determinants of Health   Financial Resource Strain: Not on file  Food Insecurity: Not on file  Transportation Needs: Not on file  Physical Activity: Not on file  Stress: Not on file  Social Connections: Not on file     Family History: The patient's family history includes Diabetes in his maternal uncle; Heart disease in his maternal aunt.  ROS:   Please see the history of present illness.     All other systems reviewed and are negative.  EKGs/Labs/Other Studies Reviewed:    The following studies were reviewed today:   EKG:   01/14/2023: Normal sinus rhythm, right bundle branch block, rate 73  Recent Labs: 10/22/2022: Hemoglobin 14.6; Platelets 210 11/19/2022: ALT 19; BUN 14; Creatinine, Ser 1.06; Potassium 5.2; Sodium 147  Recent Lipid Panel    Component Value Date/Time   CHOL 201 (H) 11/19/2022 1121   TRIG 115 11/19/2022 1121   HDL 61 11/19/2022 1121   CHOLHDL 4.4 04/17/2022 0915   CHOLHDL 3.5 06/07/2015 1005   VLDL 33 (H) 06/07/2015 1005   LDLCALC 120 (H) 11/19/2022 1121    Physical Exam:    VS:  BP 123/73 (BP Location: Left Arm, Patient Position: Sitting, Cuff Size: Large)   Pulse 73   Ht 6\' 4"  (1.93 m)   Wt (!) 387 lb (175.5 kg)   SpO2 90%   BMI 47.11 kg/m     Wt Readings from Last 3 Encounters:  01/14/23 (!) 387 lb (175.5 kg)  11/19/22 (!) 379 lb 3.2 oz (172 kg)  10/22/22 (!) 375 lb 12.8 oz (170.5 kg)     GEN:  Well nourished, well developed in no acute distress HEENT: Normal NECK: No JVD; No carotid bruits LYMPHATICS: No lymphadenopathy CARDIAC: RRR, no murmurs, rubs, gallops RESPIRATORY:  Clear to auscultation without rales, wheezing or rhonchi  ABDOMEN: Soft, non-tender, non-distended MUSCULOSKELETAL: 1+  edema SKIN: Warm and dry NEUROLOGIC:  Alert and oriented x 3 PSYCHIATRIC:  Normal affect   ASSESSMENT:    1. Chest pain of uncertain etiology   2. Statin myopathy   3. Hyperlipidemia, unspecified  hyperlipidemia type   4. Essential hypertension   5. History of open leg wound    PLAN:    Chest pain: Atypical in description as describes right-sided pain but does report can occur with exertion suggesting possible angina.  Does have multiple CAD risk factors (age, hypertension, hyperlipidemia, T2DM). -Not a good coronary CTA candidate given BMI.  Recommend stress PET.  Discussed with patient, however he declines testing at this time as he is self-pay.  Will ask our social worker to reach out to patient  Hyperlipidemia: Has tried multiple statins but discontinued due to myalgias.  Currently on Zetia 10 mg daily.  LDL 120 on 11/19/2022.  Referred to pharmacy lipid clinic  Hypertension: On amlodipine 5 mg daily.  Appears controlled  T2DM: A1c 7.6%.  On Jardiance, glipizide, insulin  Leg wound: Has been following with wound clinic for diabetic ulcers in setting of venous insufficiency.  Does report discomfort in legs with walking, recommend checking ABIs but patient declines at this time as he is self-pay.  Referring to social work as above  RTC in 3 months  Medication Adjustments/Labs and Tests Ordered: Current medicines are reviewed at length with the patient today.  Concerns regarding medicines are outlined above.  Orders Placed This Encounter  Procedures   AMB Referral to [facility]   EKG 12-Lead   No orders of the defined types were placed in this encounter.   Patient Instructions  Medication Instructions:  Continue all current medication *If you need a refill on your cardiac medications before your next appointment, please call your pharmacy*   Lab Work: none If you have labs (blood work) drawn today and your tests are completely normal, you will receive your results only by: MyChart Message (if you have MyChart) OR A paper copy in the mail If you have any lab test that is abnormal or we need to change your treatment, we will call you to review the  results.   Testing/Procedures: none   Follow-Up: At [facility], you and your health needs are our priority.  As part of our continuing mission to provide you with exceptional heart care, we have created designated Provider Care Teams.  These Care Teams include your primary Cardiologist (physician) and Advanced Practice Providers (APPs -  Physician Assistants and Nurse Practitioners) who all work together to provide you with the care you need, when you need it.  We recommend signing up for the patient portal called "MyChart".  Sign up information is provided on this After Visit Summary.  MyChart is used to connect with patients for Virtual Visits (Telemedicine).  Patients are able to view lab/test results, encounter notes, upcoming appointments, etc.  Non-urgent messages can be sent to your provider as well.   To learn more about what you can do with MyChart, go to [email or web address].    Your next appointment:   3 month(s)  Provider:   Dr. Bjorn Pippin      Signed, Little Ishikawa, MD  01/14/2023 8:50 AM    [facility]

## 2023-01-14 ENCOUNTER — Ambulatory Visit: Payer: Self-pay | Attending: Cardiology | Admitting: Cardiology

## 2023-01-14 ENCOUNTER — Telehealth (HOSPITAL_BASED_OUTPATIENT_CLINIC_OR_DEPARTMENT_OTHER): Payer: Self-pay | Admitting: Licensed Clinical Social Worker

## 2023-01-14 ENCOUNTER — Encounter: Payer: Self-pay | Admitting: Cardiology

## 2023-01-14 VITALS — BP 123/73 | HR 73 | Ht 76.0 in | Wt 387.0 lb

## 2023-01-14 DIAGNOSIS — E785 Hyperlipidemia, unspecified: Secondary | ICD-10-CM

## 2023-01-14 DIAGNOSIS — I10 Essential (primary) hypertension: Secondary | ICD-10-CM

## 2023-01-14 DIAGNOSIS — T466X5A Adverse effect of antihyperlipidemic and antiarteriosclerotic drugs, initial encounter: Secondary | ICD-10-CM

## 2023-01-14 DIAGNOSIS — Z87828 Personal history of other (healed) physical injury and trauma: Secondary | ICD-10-CM

## 2023-01-14 DIAGNOSIS — R079 Chest pain, unspecified: Secondary | ICD-10-CM

## 2023-01-14 DIAGNOSIS — G720 Drug-induced myopathy: Secondary | ICD-10-CM

## 2023-01-14 NOTE — Patient Instructions (Addendum)
Medication Instructions:  Continue all current medication *If you need a refill on your cardiac medications before your next appointment, please call your pharmacy*   Lab Work: none If you have labs (blood work) drawn today and your tests are completely normal, you will receive your results only by: MyChart Message (if you have MyChart) OR A paper copy in the mail If you have any lab test that is abnormal or we need to change your treatment, we will call you to review the results.   Testing/Procedures: none   Follow-Up: At [facility], you and your health needs are our priority.  As part of our continuing mission to provide you with exceptional heart care, we have created designated Provider Care Teams.  These Care Teams include your primary Cardiologist (physician) and Advanced Practice Providers (APPs -  Physician Assistants and Nurse Practitioners) who all work together to provide you with the care you need, when you need it.  We recommend signing up for the patient portal called "MyChart".  Sign up information is provided on this After Visit Summary.  MyChart is used to connect with patients for Virtual Visits (Telemedicine).  Patients are able to view lab/test results, encounter notes, upcoming appointments, etc.  Non-urgent messages can be sent to your provider as well.   To learn more about what you can do with MyChart, go to [email or web address].    Your next appointment:   3 month(s)  Provider:   Dr. Bjorn Pippin

## 2023-01-14 NOTE — Addendum Note (Signed)
Addended by: Lizabeth Leyden R on: 01/14/2023 09:02 AM   Modules accepted: Orders

## 2023-01-15 ENCOUNTER — Telehealth: Payer: Self-pay | Admitting: Licensed Clinical Social Worker

## 2023-01-15 NOTE — Telephone Encounter (Signed)
H&V Care Navigation CSW Progress Note  Clinical Social Worker contacted patient by phone to f/u on call to office. Pt reached at [phone]. Shared he thinks someone called from this office. I dont see any new calls noted from here, but we reviewed again what information I mailed to him and he will let me know when received. He's hopeful to go to see Medicaid caseworkers and [place] next week. No additional questions today.  Patient is participating in a Managed Medicaid Plan:  No, self pay only  SDOH Screenings   Food Insecurity: Food Insecurity Present (01/15/2023)  Housing: Low Risk  (01/15/2023)  Transportation Needs: No Transportation Needs (01/15/2023)  Utilities: Not At Risk (01/15/2023)  Depression (PHQ2-9): Low Risk  (10/22/2022)  Financial Resource Strain: Medium Risk (01/15/2023)  Tobacco Use: Low Risk  (01/14/2023)  Health Literacy: Adequate Health Literacy (01/15/2023)   Dillon Mcgee, MSW, LCSW Clinical Social Worker II [facility] Health Heart/Vascular Care Navigation  [phone]- work cell phone (preferred) [phone]- desk phone

## 2023-01-15 NOTE — Progress Notes (Signed)
Heart and Vascular Care Navigation  01-14-2023  Dillon Mcgee Dec 01, 1960 [id number]  Reason for Referral: uninsured Patient is participating in a Managed Medicaid Plan: No, self pay only  Engaged with patient by telephone for initial visit for Heart and Vascular Care Coordination.                                                                                                   Assessment:  LCSW was able to reach pt after appt at [phone]. Introduced self, role, reason for call. Pt confirmed home address, PCP, and currently employed but uninsured. Emergency contact is his father, he lives alone, and has no issues with transportation at this time. Pt shares rent, utilities and food on a limited income can be difficult but he has mostly managed. He previously had [employer], CAFA coverage. He was unaware this has to be resubmitted every 6 months.   LCSW discussed first speaking with Medicaid counselors at [employer] as they may be able to screen pt for Medicaid. His income may be slightly over but encouraged him to check. If not eligible he should bring [employer] and [employer] with all documents to speak with Mikle Bosworth who is in the same office Tuesday-Thursday. Pt agreeable. I will also send some additional food resources and encouraged pt to f/u if he finds he has difficulty with any costs of living that we may be able to connect him to some community resources. Pt agreeable at this time. No additional questions.                                      HRT/VAS Care Coordination     Patients Home Cardiology Office [facility]   Outpatient Care Team Social Worker   Social Worker Name: [name], [place], [phone]   Living arrangements for the past 2 months Single Family Home   Lives with: Self   Patient Current Insurance Coverage Self-Pay   Patient Has Concern With Paying Medical Bills Yes   Patient Concerns With Medical Bills no  insurance   Medical Bill Referrals: Medicaid screening, [facility] Financial Assistance, Orange Card   Does Patient Have Prescription Coverage? No   Patient Prescription Assistance Programs [place] [facility]   [place] [facility] Medications mailed application       Social History:                                                                             SDOH Screenings   Food Insecurity: Food Insecurity Present (01/15/2023)  Housing: Low Risk  (01/15/2023)  Transportation Needs: No Transportation Needs (01/15/2023)  Utilities: Not At Risk (01/15/2023)  Depression (PHQ2-9): Low Risk  (10/22/2022)  Financial Resource Strain: Medium  Risk (01/15/2023)  Tobacco Use: Low Risk  (01/14/2023)  Health Literacy: Adequate Health Literacy (01/15/2023)    SDOH Interventions: Financial Resources:  Financial Strain Interventions: Other (Comment) (limited income- no medical coverage- sent food resources and [employer] letter, sent [employer]ed[employer]ist/CAFA/[place] Card applications) DSS for financial assistance and Editor, commissioning for [employer] Program  Food Insecurity:  Food Insecurity Interventions: Other (Comment) (FNS card from [employer])  Housing Insecurity:  Housing Interventions: Intervention Not Indicated  Transportation:   Transportation Interventions: Intervention Not Indicated    Other Care Navigation Interventions:     Provided Pharmacy assistance resources [place] [facility]   Follow-up plan:   LCSW mailed pt the following: my card, food pantry list, [employer], [employer], [employer], Landscape architect with Mikle Bosworth, Engineer, civil (consulting). Will f/u to ensure received/answer any additional questions.

## 2023-01-19 ENCOUNTER — Other Ambulatory Visit: Payer: Self-pay

## 2023-01-22 ENCOUNTER — Telehealth: Payer: Self-pay | Admitting: Licensed Clinical Social Worker

## 2023-01-22 NOTE — Telephone Encounter (Signed)
H&V Care Navigation CSW Progress Note  Clinical Social Worker contacted patient by phone to f/u on assistance applications. Was able to reach pt at [phone]. He confirms he has an appt with Mikle Bosworth, Artist, and is at [facility] just waiting for that meeting. No additional questions at this time. Encouraged him to contact me if needed.   Patient is participating in a Managed Medicaid Plan:  No, self pay only  SDOH Screenings   Food Insecurity: Food Insecurity Present (01/15/2023)  Housing: Low Risk  (01/15/2023)  Transportation Needs: No Transportation Needs (01/15/2023)  Utilities: Not At Risk (01/15/2023)  Depression (PHQ2-9): Low Risk  (10/22/2022)  Financial Resource Strain: Medium Risk (01/15/2023)  Tobacco Use: Low Risk  (01/14/2023)  Health Literacy: Adequate Health Literacy (01/15/2023)   [name], MSW, LCSW Clinical Social Worker II [facility] Health Heart/Vascular Care Navigation  [phone]- work cell phone (preferred) [phone]- desk phone

## 2023-01-31 ENCOUNTER — Other Ambulatory Visit: Payer: Self-pay

## 2023-02-02 ENCOUNTER — Other Ambulatory Visit: Payer: Self-pay

## 2023-02-04 ENCOUNTER — Other Ambulatory Visit: Payer: Self-pay

## 2023-02-11 ENCOUNTER — Other Ambulatory Visit: Payer: Self-pay

## 2023-02-13 ENCOUNTER — Telehealth: Payer: Self-pay | Admitting: Licensed Clinical Social Worker

## 2023-02-13 NOTE — Telephone Encounter (Signed)
H&V Care Navigation CSW Progress Note  Clinical International aid/development worker for [employer].   CHARITY COMPLETE. PATIENT APPROVED FOR 100% FINANCIAL ASSISTANCE PER CAFA APPLICATION AND SUPPORTING DOCUMENTATION. CAFA APP SCANNED TO ACCOUNT [id number] APPROVAL DATES OF FPL ---- 01/22/23 - 07/22/23 APPROVAL LETTER ON ACCOUNT [id number]   Patient is participating in a Managed Medicaid Plan:  No, self pay, CAFA approved  SDOH Screenings   Food Insecurity: Food Insecurity Present (01/15/2023)  Housing: Low Risk  (01/15/2023)  Transportation Needs: No Transportation Needs (01/15/2023)  Utilities: Not At Risk (01/15/2023)  Depression (PHQ2-9): Low Risk  (10/22/2022)  Financial Resource Strain: Medium Risk (01/15/2023)  Tobacco Use: Low Risk  (01/14/2023)  Health Literacy: Adequate Health Literacy (01/15/2023)    [name], MSW, LCSW Clinical Social Worker II [facility] Heart/Vascular Care Navigation  [phone]- work cell phone (preferred) [phone]- desk phone

## 2023-02-16 ENCOUNTER — Other Ambulatory Visit (HOSPITAL_COMMUNITY): Payer: Self-pay

## 2023-02-16 ENCOUNTER — Other Ambulatory Visit: Payer: Self-pay

## 2023-02-19 ENCOUNTER — Other Ambulatory Visit: Payer: Self-pay

## 2023-02-19 ENCOUNTER — Other Ambulatory Visit: Payer: Self-pay | Admitting: Family Medicine

## 2023-02-19 DIAGNOSIS — E1169 Type 2 diabetes mellitus with other specified complication: Secondary | ICD-10-CM

## 2023-02-19 MED ORDER — OMEGA-3-ACID ETHYL ESTERS 1 G PO CAPS
1.0000 | ORAL_CAPSULE | Freq: Two times a day (BID) | ORAL | 2 refills | Status: DC
  Filled 2023-02-19: qty 60, 30d supply, fill #0
  Filled 2023-03-23: qty 60, 30d supply, fill #1
  Filled 2023-04-27: qty 60, 30d supply, fill #2

## 2023-02-19 NOTE — Telephone Encounter (Signed)
Requested Prescriptions  Pending Prescriptions Disp Refills   omega-3 acid ethyl esters (LOVAZA) 1 g capsule 180 capsule 1    Sig: TAKE 1 CAPSULE BY MOUTH 2 TIMES DAILY.     Endocrinology:  Nutritional Agents - omega-3 acid ethyl esters Failed - 02/19/2023  2:35 PM      Failed - Lipid Panel in normal range within the last 12 months    Cholesterol, Total  Date Value Ref Range Status  11/19/2022 201 (H) 100 - 199 mg/dL Final   LDL Chol Calc (NIH)  Date Value Ref Range Status  11/19/2022 120 (H) 0 - 99 mg/dL Final   HDL  Date Value Ref Range Status  11/19/2022 61 >39 mg/dL Final   Triglycerides  Date Value Ref Range Status  11/19/2022 115 0 - 149 mg/dL Final         Passed - Valid encounter within last 12 months    Recent Outpatient Visits           3 months ago Statin myopathy   [facility] [place]. [facility] Hoy Register, MD   4 months ago Left leg cellulitis   [facility] - [facility] Dept Of [place]. [facility], [place] W, NP   7 months ago Type 2 diabetes mellitus with diabetic polyneuropathy, with long-term current use of insulin (HCC)   [facility] Of [place]. [facility] Hoy Register, MD   10 months ago Type 2 diabetes mellitus with diabetic polyneuropathy, with long-term current use of insulin (HCC)   [facility] Of [place]. [facility] [place], [place], [place]   1 year ago Type 2 diabetes mellitus with diabetic polyneuropathy, with long-term current use of insulin (HCC)   [facility] - [facility] Dept Of [place]. [facility] Hoy Register, MD       Future Appointments             In 2 months Little Ishikawa, MD [facility] at [facility]   In 3 months Hoy Register, MD [facility] - A Dept Of [place] [facility]. [facility]

## 2023-03-16 ENCOUNTER — Other Ambulatory Visit: Payer: Self-pay

## 2023-03-17 ENCOUNTER — Other Ambulatory Visit: Payer: Self-pay

## 2023-03-17 ENCOUNTER — Other Ambulatory Visit: Payer: Self-pay | Admitting: Family Medicine

## 2023-03-17 DIAGNOSIS — N401 Enlarged prostate with lower urinary tract symptoms: Secondary | ICD-10-CM

## 2023-03-17 MED ORDER — FINASTERIDE 5 MG PO TABS
5.0000 mg | ORAL_TABLET | Freq: Every day | ORAL | 0 refills | Status: DC
Start: 2023-03-17 — End: 2023-05-20
  Filled 2023-03-17 – 2023-03-19 (×3): qty 90, 90d supply, fill #0

## 2023-03-18 ENCOUNTER — Other Ambulatory Visit: Payer: Self-pay

## 2023-03-19 ENCOUNTER — Other Ambulatory Visit: Payer: Self-pay

## 2023-03-20 ENCOUNTER — Other Ambulatory Visit: Payer: Self-pay

## 2023-03-23 ENCOUNTER — Other Ambulatory Visit: Payer: Self-pay

## 2023-03-30 ENCOUNTER — Telehealth: Payer: Self-pay | Admitting: Cardiology

## 2023-03-30 NOTE — Telephone Encounter (Signed)
Patient called and said that he think he had a mini stroke this morning. Patient said he could not keep nothing in his hand, was blowing horn involuntarily as well as taking hands off wheel

## 2023-03-30 NOTE — Telephone Encounter (Signed)
Called patient with no answer. Left message to call back

## 2023-03-31 NOTE — Telephone Encounter (Signed)
2nd attempt to call patient, no answer left message requesting a call back.

## 2023-04-01 ENCOUNTER — Telehealth: Payer: Self-pay | Admitting: Family Medicine

## 2023-04-01 NOTE — Telephone Encounter (Signed)
Noted

## 2023-04-01 NOTE — Telephone Encounter (Signed)
I called the patient to follow up with his concerns and he was at his job driving and delivering car parts. He said he feels fine now.  The symptoms he reported occurred on Monday, 03/30/2023 and resolved the same day.  He explained that he thinks the symptoms may have been due to low blood sugar of 45.  He said his blood sugars usually run in the 70s and he only checks them in the morning. I instructed him to also check his blood sugar at bedtime and keep a log of the results to share with Dr Alvis Lemmings on 04/07/2023.     I explained to him that the MM[facility] will be at [facility] 04/02/2023 from 0800-1700, no appointment needed. He did not want me to look for an appointment to be seen sooner at [facility].  He wants to keep the appointment on 04/07/2023 and he said he will go to the ED immediately if the symptoms re-occur.  He explained that he called cardiology to report the same symptoms and they instructed him to contact PCP and go to ED if the symptoms return.

## 2023-04-01 NOTE — Telephone Encounter (Signed)
Patient identification verified by 2 forms. Marilynn Rail, RN    Called and spoke to patient  Patient states:   -feels a whole lot better this morning   -he was doing involuntary movements   -believes blood sugar was previously low   -symptoms happened once   -he is fine now   -has decreased appetite  Patient denies:   -chest pain   -SOB/difficulty breathing  Advised patient to outreach PCP for follow up  Informed patient if symptoms return present to ED  Patient verbalized understanding, no questions at this time

## 2023-04-01 NOTE — Telephone Encounter (Signed)
Patient called in with E2C2 on the line about wanting an appointment, patient stated he thinks he had a stroke recently stating he had SOB , involuntary movements with his hands where he was hitting himself and he couldn't pick up light weight objects like a piece of paper and hold it for more than 5 seconds. Patient also expressed he doesn't have an appetite and has a dry throat and doesn't know if its related. Patient stated he called Cardiology and they advised him to call here to be seen. And it was made for 2/4 with PCP advised patient that if symptoms worsened or he couldn't wait to go to ED or UC

## 2023-04-02 ENCOUNTER — Telehealth: Payer: Self-pay | Admitting: *Deleted

## 2023-04-02 NOTE — Telephone Encounter (Signed)
Patient has upcoming appointment for 04/07/23 and concerns will be discussed then.

## 2023-04-02 NOTE — Telephone Encounter (Signed)
Dr. Baxter Flattery team follow up required: Patient needs assistance with obtaining Fecal occult blood test for CRC Screening.  Patient has an appointment with Dr. Alvis Lemmings on 04/07/23 and is willing to pick up kit during this visit.  Please also contact [facility] to obtain colonoscopy records and have records abstracted into [facility].  You may also contact Elmer Picker BSN, RN,CCM, Health Equity Program Manager (HEPM) at [phone] if additional information needed.  Chart reviewed and [employer] verification completed.   Chart review highlights and potential barriers to colorectal cancer screening (CRC) screening: Per patient's health maintenance, patient  is due for Fecal occult blood test as of 08/22/21.  Patient had last Fecal occult blood test completed on 08/22/20 with negative results. Patient had Fecal occult blood, imunochemical specimen ordered on 10/08/21 and no results in [facility].  Patient 's most recent GI referral for colonoscopy was closed on 02/01/16  because patient did not return call to [facility] to schedule.  Per 01/01/16 office visit note, last colonoscopy was completed at [facility] approximately 5 years ago and patient was also interested in completing Cologuard test for financial reasons.   Telephone call to patient, no answer, left voicemail message, advised calling from Dr. Baxter Flattery office, and requested call back at my direct extension [phone]).   Telephone call from patient, states he is returning HEPM's Copy) call.  HEPM verified patient's name, date of birth, and address. ).  Patient in agreement to outreach and colon cancer screening (CRC) follow up. Discussed patient's colon cancer screening history per chart review.  Patient states his main concern was his low blood sugar and " seizure like activity that he experienced on Monday 03/30/23, and is now feeling much better.  States he has already reported his concerns to Dr. Alvis Lemmings and his heart doctor.   States he will go to the ED as discussed with Dr. Baxter Flattery office,  if the symptoms return prior to his follow up appointment with Dr. Alvis Lemmings on 04/07/23. Patient states he had a colonoscopy with [facility] in the past, over 10 -20 years ago. States he will hold off on getting a colonoscopy at this time due to several urgent medical concerns and medical bills. States he will complete Fecal occult blood test next week.  States he will obtain a Fecal occult blood test kit during his follow up appointment with Dr. Alvis Lemmings. States he has completed Fecal occult blood test several times in the past and results have always been negative.  Discussed the different types of CRC Screening test with the specific test frequency and patient voices understanding. HEPM advised will update Dr. Alvis Lemmings on this conversation and update his appointment note to advise the team that he would like to obtain kit during follow up visit. States all of his questions were answered and has no additional questions at this time. Patient states he has no barriers to completing testing.  States he is very Adult nurse of HEPM's calls and will reach out to HEPM or Dr. Alvis Lemmings, if he experiences any barriers to test completion. No further HEPM interventions needed at this time.   Appointment note updated for 04/07/23 office visit note with Dr. Alvis Lemmings: Patient would like to obtain Fecal occult blood test kit for CRC screening during today's visit.     Dalyla Chui H. Docia Furl, Charity fundraiser, [facility] [phone]

## 2023-04-07 ENCOUNTER — Ambulatory Visit: Payer: Self-pay | Attending: Family Medicine | Admitting: Family Medicine

## 2023-04-07 ENCOUNTER — Other Ambulatory Visit: Payer: Self-pay

## 2023-04-07 ENCOUNTER — Encounter: Payer: Self-pay | Admitting: Family Medicine

## 2023-04-07 VITALS — BP 132/69 | HR 72 | Ht 76.0 in | Wt 376.2 lb

## 2023-04-07 DIAGNOSIS — L719 Rosacea, unspecified: Secondary | ICD-10-CM

## 2023-04-07 DIAGNOSIS — E66813 Obesity, class 3: Secondary | ICD-10-CM

## 2023-04-07 DIAGNOSIS — Z79899 Other long term (current) drug therapy: Secondary | ICD-10-CM | POA: Insufficient documentation

## 2023-04-07 DIAGNOSIS — R29818 Other symptoms and signs involving the nervous system: Secondary | ICD-10-CM

## 2023-04-07 DIAGNOSIS — Z7984 Long term (current) use of oral hypoglycemic drugs: Secondary | ICD-10-CM

## 2023-04-07 DIAGNOSIS — E1142 Type 2 diabetes mellitus with diabetic polyneuropathy: Secondary | ICD-10-CM

## 2023-04-07 DIAGNOSIS — E785 Hyperlipidemia, unspecified: Secondary | ICD-10-CM | POA: Insufficient documentation

## 2023-04-07 DIAGNOSIS — Z6841 Body Mass Index (BMI) 40.0 and over, adult: Secondary | ICD-10-CM

## 2023-04-07 DIAGNOSIS — T426X5A Adverse effect of other antiepileptic and sedative-hypnotic drugs, initial encounter: Secondary | ICD-10-CM | POA: Insufficient documentation

## 2023-04-07 DIAGNOSIS — R531 Weakness: Secondary | ICD-10-CM

## 2023-04-07 DIAGNOSIS — G720 Drug-induced myopathy: Secondary | ICD-10-CM

## 2023-04-07 DIAGNOSIS — T466X5A Adverse effect of antihyperlipidemic and antiarteriosclerotic drugs, initial encounter: Secondary | ICD-10-CM

## 2023-04-07 DIAGNOSIS — E669 Obesity, unspecified: Secondary | ICD-10-CM | POA: Insufficient documentation

## 2023-04-07 DIAGNOSIS — R569 Unspecified convulsions: Secondary | ICD-10-CM | POA: Insufficient documentation

## 2023-04-07 DIAGNOSIS — I10 Essential (primary) hypertension: Secondary | ICD-10-CM | POA: Insufficient documentation

## 2023-04-07 DIAGNOSIS — Z794 Long term (current) use of insulin: Secondary | ICD-10-CM

## 2023-04-07 DIAGNOSIS — R0902 Hypoxemia: Secondary | ICD-10-CM

## 2023-04-07 LAB — POCT GLYCOSYLATED HEMOGLOBIN (HGB A1C): HbA1c, POC (controlled diabetic range): 6.8 % (ref 0.0–7.0)

## 2023-04-07 MED ORDER — METRONIDAZOLE 1 % EX GEL
Freq: Every day | CUTANEOUS | 3 refills | Status: AC
  Filled 2023-04-07: qty 60, 30d supply, fill #0

## 2023-04-07 NOTE — Patient Instructions (Signed)
 VISIT SUMMARY:  During today's visit, we discussed your concerns about low oxygen levels, excessive sleepiness, and a recent episode of uncontrolled muscle spasms and weakness. We also reviewed your diabetes management and rosacea treatment.  YOUR PLAN:  -HYPOXIA: Hypoxia means low oxygen levels in the blood. We will order a chest X-ray to check for lung issues and a sleep study to see if you have sleep apnea. If you experience seizure-like symptoms again, please go to the emergency department.  -SEIZURE-LIKE ACTIVITY AND WEAKNESS: You had an episode of uncontrolled muscle spasms and weakness while driving, which might be related to low oxygen levels.  I have ordered a CT of your head to evaluate this.  If these symptoms happen again, we will refer you to a neurologist.  -TYPE 2 DIABETES MELLITUS: Your diabetes is well-controlled with a recent A1C of 6.8. Continue your current insulin  regimen, but reduce your Lantus  dose by 2 units if your blood sugar is less than 60. Aim for fasting blood sugar between 80-120 and random blood sugar less than 200.  -GABAPENTIN -RELATED SEDATION: Gabapentin  can cause sleepiness. To avoid daytime sedation, take your gabapentin  dose in the evening instead of the morning.  -ROSACEA: Rosacea is a skin condition that causes facial redness. We have prescribed Metrogel  to help manage your symptoms.  -GENERAL HEALTH MAINTENANCE: We will refer you to an ophthalmologist for a diabetic eye exam. Please follow up in one month to review the results of your chest X-ray and sleep study, and to decide if you need to see a lung specialist.  INSTRUCTIONS:  Please follow up in one month to review the results of your chest X-ray and sleep study, and to determine if a pulmonology referral is needed.

## 2023-04-07 NOTE — Progress Notes (Signed)
 Subjective:  Patient ID: Dillon Mcgee, male    DOB: 1960-04-29  Age: 63 y.o. MRN: [id number]  CC: Medical Management of Chronic Issues (Had seizure while driving/Discuss Jardiance  medication)   HPI Dillon Mcgee is a 63 y.o. year old male with a history of hypertension, type 2 diabetes mellitus (A1c 6.8), hyperlipidemia, statin intolerance, obesity, Rosacea here for follow-up visit.   Interval History: Discussed the use of AI scribe software for clinical note transcription with the patient, who gave verbal consent to proceed.   The patient, with a history of diabetes and rosacea, presents with concerns of low oxygen levels and excessive sleepiness. He reports no difficulty breathing, but his oxygen level was measured at 82%, and looking back to his last visit it was 94%. He also mentions waking up 4-5 times during the night to urinate, but denies snoring or having been tested for sleep apnea.  The patient also reports excessive sleepiness, particularly between 2-4pm, which he initially attributed to his medication, Jardiance . However, upon discussion, it was determined that the sleepiness might be due to his gabapentin  medication, which he was taking all at once (900 mg) in the morning to avoid forgetting.  Additionally, the patient reported an unusual episode while driving for work, where he experienced uncontrolled muscle spasms and weakness, to the point where he was hitting himself and the steering wheel. He also reported difficulty lifting objects during this episode. The patient was unsure of the cause of this episode, suggesting it could have been a seizure, stroke, or a low blood sugar attack.  Reviewing his blood sugar log he did have a blood sugar of 45, 5 days ago and he had a biscuit and chocolate milk but did not check his blood sugar afterward.       Past Medical History:  Diagnosis Date   Diabetes mellitus without complication (HCC)    Hyperlipidemia    Hypertension      Past Surgical History:  Procedure Laterality Date   COLONOSCOPY     5 years ago     Family History  Problem Relation Age of Onset   Heart disease Maternal Aunt    Diabetes Maternal Uncle     Social History   Socioeconomic History   Marital status: Single    Spouse name: Not on file   Number of children: Not on file   Years of education: Not on file   Highest education level: Not on file  Occupational History   Not on file  Tobacco Use   Smoking status: Never   Smokeless tobacco: Never  Vaping Use   Vaping status: Never Used  Substance and Sexual Activity   Alcohol use: No    Alcohol/week: 0.0 standard drinks of alcohol   Drug use: Not on file   Sexual activity: Not on file  Other Topics Concern   Not on file  Social History Narrative   Not on file   Social Drivers of Health   Financial Resource Strain: Medium Risk (04/07/2023)   Overall Financial Resource Strain (CARDIA)    Difficulty of Paying Living Expenses: Somewhat hard  Food Insecurity: Food Insecurity Present (04/07/2023)   Hunger Vital Sign    Worried About Running Out of Food in the Last Year: Never true    Ran Out of Food in the Last Year: Sometimes true  Transportation Needs: No Transportation Needs (04/07/2023)   PRAPARE - Administrator, Civil Service (Medical): No    Lack of Transportation (Non-Medical):  No  Physical Activity: Inactive (04/07/2023)   Exercise Vital Sign    Days of Exercise per Week: 0 days    Minutes of Exercise per Session: 0 min  Stress: Stress Concern Present (04/07/2023)   [employer] of Occupational Health - Occupational Stress Questionnaire    Feeling of Stress : To some extent  Social Connections: Moderately Isolated (04/07/2023)   Social Connection and Isolation Panel [NHANES]    Frequency of Communication with Friends and Family: Once a week    Frequency of Social Gatherings with Friends and Family: Never    Attends Religious Services: 1 to 4 times per  year    Active Member of [employer] or Organizations: Yes    Attends Banker Meetings: Never    Marital Status: Never married    Allergies  Allergen Reactions   Lisinopril  Other (See Comments)    Hyperkalemia    Pioglitazone  Swelling    Lower extremity swelling.    Outpatient Medications Prior to Visit  Medication Sig Dispense Refill   amLODipine  (NORVASC ) 5 MG tablet Take 1 tablet (5 mg total) by mouth daily. 90 tablet 1   aspirin  EC 81 MG tablet Take 1 tablet (81 mg total) daily by mouth. 90 tablet 3   Blood Glucose Monitoring Suppl (TRUE METRIX GO GLUCOSE METER) w/Device KIT 1 each by Does not apply route every 8 (eight) hours as needed. 1 kit 0   cholecalciferol (VITAMIN D3) 25 MCG (1000 UNIT) tablet Take 1,000 Units by mouth daily.     empagliflozin  (JARDIANCE ) 25 MG TABS tablet Take 1 tablet (25 mg total) by mouth daily before breakfast. 90 tablet 1   ezetimibe  (ZETIA ) 10 MG tablet Take 1 tablet (10 mg total) by mouth daily. 90 tablet 1   ferrous sulfate 325 (65 FE) MG tablet Take 325 mg by mouth daily with breakfast.     finasteride  (PROSCAR ) 5 MG tablet Take 1 tablet (5 mg total) by mouth daily. 90 tablet 0   gabapentin  (NEURONTIN ) 300 MG capsule Take 1 capsule (300 mg total) by mouth 3 (three) times daily. 90 capsule 6   glipiZIDE  (GLUCOTROL ) 10 MG tablet Take 1 tablet (10 mg total) by mouth in the morning AND 0.5 tablets (5 mg total) every evening. 45 tablet 6   glucose blood (TRUE METRIX BLOOD GLUCOSE TEST) test strip Use as instructed 3 times daily. 100 strip 12   Insulin  Glargine (BASAGLAR  KWIKPEN) 100 UNIT/ML Inject 40 Units into the skin 2 (two) times daily. 30 mL 6   Insulin  Pen Needle (TRUEPLUS PEN NEEDLES) 32G X 4 MM MISC use as directed to inject insulin  twice daily. 100 each 6   Insulin  Syringe-Needle U-100 (TRUEPLUS INSULIN  SYRINGE) 30G X 5/16 0.5 ML MISC Use as directed to inject Levemir  twice daily. 100 each 1   meloxicam  (MOBIC ) 15 MG tablet Take 1  tablet (15 mg total) by mouth daily. 90 tablet 0   omega-3 acid ethyl esters (LOVAZA ) 1 g capsule TAKE 1 CAPSULE BY MOUTH 2 TIMES DAILY. 180 capsule 2   tamsulosin  (FLOMAX ) 0.4 MG CAPS capsule Take 1 capsule (0.4 mg total) by mouth daily. 90 capsule 1   TRUEPLUS LANCETS 26G MISC 1 each by Does not apply route every 8 (eight) hours as needed. 100 each 12   TRULICITY  4.5 MG/0.5ML SOPN INJECT 4.5MG  (0.5ML) UNDER THE SKIN ONCE A WEEK 8 mL 0   calcipotriene -betamethasone  (TACLONEX ) ointment Apply ointment to affected area(s) daily. (Patient not taking: Reported on 04/07/2023)  100 g 1   doxycycline  (VIBRA -TABS) 100 MG tablet Take 1 tablet (100 mg total) by mouth 2 (two) times daily. (Patient not taking: Reported on 04/07/2023) 20 tablet 0   No facility-administered medications prior to visit.     ROS Review of Systems  Constitutional:  Negative for activity change and appetite change.  HENT:  Negative for sinus pressure and sore throat.   Respiratory:  Negative for chest tightness, shortness of breath and wheezing.   Cardiovascular:  Negative for chest pain and palpitations.  Gastrointestinal:  Negative for abdominal distention, abdominal pain and constipation.  Genitourinary: Negative.   Musculoskeletal: Negative.   Skin:  Positive for color change.  Neurological:  Positive for weakness.  Psychiatric/Behavioral:  Negative for behavioral problems and dysphoric mood.     Objective:  BP 132/69   Pulse 72   Ht 6' 4 (1.93 m)   Wt (!) 376 lb 3.2 oz (170.6 kg)   SpO2 (!) 84%   BMI 45.79 kg/m      04/07/2023    2:52 PM 01/14/2023    8:04 AM 11/19/2022   10:17 AM  BP/Weight  Systolic BP 132 123 125  Diastolic BP 69 73 74  Wt. (Lbs) 376.2 387 379.2  BMI 45.79 kg/m2 47.11 kg/m2 47.4 kg/m2      Physical Exam Constitutional:      Appearance: He is well-developed.  Cardiovascular:     Rate and Rhythm: Normal rate.     Heart sounds: Normal heart sounds. No murmur heard. Pulmonary:      Effort: Pulmonary effort is normal.     Breath sounds: Normal breath sounds. No wheezing or rales.  Chest:     Chest wall: No tenderness.  Abdominal:     General: Bowel sounds are normal. There is distension.     Palpations: Abdomen is soft. There is no mass.     Tenderness: There is no abdominal tenderness.  Musculoskeletal:        General: Normal range of motion.     Right lower leg: No edema.     Left lower leg: No edema.  Skin:    Comments: Erythema of face  Neurological:     Mental Status: He is alert and oriented to person, place, and time.     Motor: No weakness.     Coordination: Coordination normal.     Gait: Gait normal.  Psychiatric:        Mood and Affect: Mood normal.        Latest Ref Rng & Units 04/07/2023    3:44 PM 11/19/2022   11:21 AM 04/17/2022    9:15 AM  CMP  Glucose 70 - 99 mg/dL 877  54  60   BUN 8 - 27 mg/dL 21  14  16    Creatinine 0.76 - 1.27 mg/dL 8.70  8.93  8.94   Sodium 134 - 144 mmol/L 145  147  146   Potassium 3.5 - 5.2 mmol/L 4.9  5.2  4.8   Chloride 96 - 106 mmol/L 102  104  106   CO2 20 - 29 mmol/L 28  22  25    Calcium  8.6 - 10.2 mg/dL 9.4  9.5  9.4   Total Protein 6.0 - 8.5 g/dL  7.4  6.7   Total Bilirubin 0.0 - 1.2 mg/dL  0.4  0.3   Alkaline Phos 44 - 121 IU/L  92  86   AST 0 - 40 IU/L  31  20  ALT 0 - 44 IU/L  19  24     Lipid Panel     Component Value Date/Time   CHOL 201 (H) 11/19/2022 1121   TRIG 115 11/19/2022 1121   HDL 61 11/19/2022 1121   CHOLHDL 4.4 04/17/2022 0915   CHOLHDL 3.5 06/07/2015 1005   VLDL 33 (H) 06/07/2015 1005   LDLCALC 120 (H) 11/19/2022 1121    CBC    Component Value Date/Time   WBC 4.7 10/22/2022 1035   WBC 7.4 12/03/2015 1704   RBC 4.72 10/22/2022 1035   RBC 4.17 (L) 12/03/2015 1704   HGB 14.6 10/22/2022 1035   HCT 44.8 10/22/2022 1035   PLT 210 10/22/2022 1035   MCV 95 10/22/2022 1035   MCH 30.9 10/22/2022 1035   MCH 30.2 12/03/2015 1704   MCHC 32.6 10/22/2022 1035   MCHC 32.4  12/03/2015 1704   RDW 11.8 10/22/2022 1035   LYMPHSABS 1.4 10/22/2022 1035   MONOABS 518 12/03/2015 1704   EOSABS 0.1 10/22/2022 1035   BASOSABS 0.0 10/22/2022 1035    Lab Results  Component Value Date   HGBA1C 6.8 04/07/2023    Assessment & Plan:      Hypoxia Oxygen saturation measured at 82%, repeat dropped down to the upper 70s. No reported dyspnea, but possible exertional dyspnea with car entry/exit. Possible nocturnal hypoxia suggested by frequent nocturnal urination. Unclear if recent seizure-like activity while driving is related. -Order chest X-ray to evaluate for pulmonary causes of hypoxia. -Order sleep study to evaluate for sleep apnea. -Advise patient to present to ED if seizure-like symptoms recur.  ? Seizure-like activity Recent episode of uncontrolled muscle spasms and weakness while driving.  - Neuro exam is intact  -No reported alteration in consciousness or speech. Possible relation to hypoxia. -Refer for neurology consultation if symptoms recur.  Type 2 Diabetes Mellitus Well-controlled with recent A1C of 6.8. Some episodes of hypoglycemia noted. Patient reports decreased appetite and weight loss. -Continue current regimen of Lantus  insulin  40 units BID. -Advise patient to reduce Lantus  dose by 2 units if blood glucose is less than 60. -Target fasting blood glucose 80-120 and random blood glucose less than 200.  Gabapentin -related sedation Patient reports daytime sleepiness, possibly related to morning gabapentin  dosing. -Advise patient to shift gabapentin  dosing to evening to avoid daytime sedation.  Rosacea Patient reports facial flushing and has been previously diagnosed with rosacea. -Prescribe Metrogel  for rosacea.  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 -Follow-up in 1 month to review results of chest  X-ray and sleep study and determine if pulmonology referral is needed.          Meds ordered this encounter  Medications   metroNIDAZOLE  (METROGEL ) 1 % gel    Sig: Apply topically daily.    Dispense:  60 g    Refill:  3    Follow-up: Return if symptoms worsen or fail to improve, for hypoxia, previously scheduled appointment.       Corrina Sabin, MD, FAAFP. [facility] [place], [place] [phone]   04/08/2023, 8:28 AM

## 2023-04-08 LAB — BASIC METABOLIC PANEL
BUN/Creatinine Ratio: 16 (ref 10–24)
BUN: 21 mg/dL (ref 8–27)
CO2: 28 mmol/L (ref 20–29)
Calcium: 9.4 mg/dL (ref 8.6–10.2)
Chloride: 102 mmol/L (ref 96–106)
Creatinine, Ser: 1.29 mg/dL — ABNORMAL HIGH (ref 0.76–1.27)
Glucose: 122 mg/dL — ABNORMAL HIGH (ref 70–99)
Potassium: 4.9 mmol/L (ref 3.5–5.2)
Sodium: 145 mmol/L — ABNORMAL HIGH (ref 134–144)
eGFR: 63 mL/min/{1.73_m2} (ref >59)

## 2023-04-09 ENCOUNTER — Telehealth: Payer: Self-pay

## 2023-04-09 NOTE — Telephone Encounter (Signed)
 Attempted to contact [facility] to schedule [facility] for patient. I was informed that they will reach out to the patient once they get study approved through patients insurance.

## 2023-04-16 ENCOUNTER — Ambulatory Visit (HOSPITAL_COMMUNITY)
Admission: RE | Admit: 2023-04-16 | Discharge: 2023-04-16 | Disposition: A | Discharge status: Home / Self Care | Payer: Self-pay | Source: Ambulatory Visit | Attending: Family Medicine | Admitting: Family Medicine

## 2023-04-16 DIAGNOSIS — R531 Weakness: Secondary | ICD-10-CM | POA: Insufficient documentation

## 2023-04-16 DIAGNOSIS — R0902 Hypoxemia: Secondary | ICD-10-CM | POA: Insufficient documentation

## 2023-04-22 ENCOUNTER — Inpatient Hospital Stay (HOSPITAL_COMMUNITY): Admission: RE | Admit: 2023-04-22 | Payer: Medicaid Other | Source: Ambulatory Visit

## 2023-04-27 ENCOUNTER — Other Ambulatory Visit: Payer: Self-pay

## 2023-04-29 NOTE — Progress Notes (Unsigned)
 Cardiology Office Note:    Date:  05/01/2023   ID:  Dillon Mcgee, DOB Aug 06, 1960, MRN [id number]  PCP:  Hoy Register, MD  Cardiologist:  Little Ishikawa, MD  Electrophysiologist:  None   Referring MD: Hoy Register, MD   Chief Complaint  Patient presents with   Chest Pain    History of Present Illness:    Dillon Mcgee is a 63 y.o. male with a hx of hypertension, hyperlipidemia, T2DM who presents for follow-up.  He was referred by Dr. Alvis Lemmings for evaluation of hyperlipidemia, initially seen 01/14/2019.  Tried on multiple statins but discontinued due to myalgias.    Echocardiogram 08/2016 showed EF 55 to 60%, grade 1 diastolic dysfunction, no significant valvular disease.  Since last clinic visit, he reports he is doing well.  He has lost 24 pounds in last 3 months.  Made dietary changes.  He denies any recent chest pain or dyspnea, states that symptoms have resolved since he has lost weight.  He denies any lightheadedness, syncope, lower extremity edema, or palpitations.  He exercises by walking a lot at work but does not exercise outside of work.   Wt Readings from Last 3 Encounters:  05/01/23 (!) 363 lb 6.4 oz (164.8 kg)  04/07/23 (!) 376 lb 3.2 oz (170.6 kg)  01/14/23 (!) 387 lb (175.5 kg)     Past Medical History:  Diagnosis Date   Diabetes mellitus without complication (HCC)    Hyperlipidemia    Hypertension     Past Surgical History:  Procedure Laterality Date   COLONOSCOPY     5 years ago     Current Medications: Current Meds  Medication Sig   amLODipine (NORVASC) 5 MG tablet Take 1 tablet (5 mg total) by mouth daily.   aspirin EC 81 MG tablet Take 1 tablet (81 mg total) daily by mouth.   Blood Glucose Monitoring Suppl (TRUE METRIX GO GLUCOSE METER) w/Device KIT 1 each by Does not apply route every 8 (eight) hours as needed.   calcipotriene-betamethasone (TACLONEX) ointment Apply ointment to affected area(s) daily.   cholecalciferol (VITAMIN  D3) 25 MCG (1000 UNIT) tablet Take 1,000 Units by mouth daily.   doxycycline (VIBRA-TABS) 100 MG tablet Take 1 tablet (100 mg total) by mouth 2 (two) times daily.   empagliflozin (JARDIANCE) 25 MG TABS tablet Take 1 tablet (25 mg total) by mouth daily before breakfast.   ezetimibe (ZETIA) 10 MG tablet Take 1 tablet (10 mg total) by mouth daily.   ferrous sulfate 325 (65 FE) MG tablet Take 325 mg by mouth daily with breakfast.   finasteride (PROSCAR) 5 MG tablet Take 1 tablet (5 mg total) by mouth daily.   gabapentin (NEURONTIN) 300 MG capsule Take 1 capsule (300 mg total) by mouth 3 (three) times daily.   glipiZIDE (GLUCOTROL) 10 MG tablet Take 1 tablet (10 mg total) by mouth in the morning AND 0.5 tablets (5 mg total) every evening.   glucose blood (TRUE METRIX BLOOD GLUCOSE TEST) test strip Use as instructed 3 times daily.   Insulin Glargine (BASAGLAR KWIKPEN) 100 UNIT/ML Inject 40 Units into the skin 2 (two) times daily.   Insulin Pen Needle (TRUEPLUS PEN NEEDLES) 32G X 4 MM MISC use as directed to inject insulin twice daily.   Insulin Syringe-Needle U-100 (TRUEPLUS INSULIN SYRINGE) 30G X 5/16" 0.5 ML MISC Use as directed to inject Levemir twice daily.   meloxicam (MOBIC) 15 MG tablet Take 1 tablet (15 mg total) by mouth daily.  metroNIDAZOLE (METROGEL) 1 % gel Apply topically daily.   omega-3 acid ethyl esters (LOVAZA) 1 g capsule TAKE 1 CAPSULE BY MOUTH 2 TIMES DAILY.   tamsulosin (FLOMAX) 0.4 MG CAPS capsule Take 1 capsule (0.4 mg total) by mouth daily.   TRUEPLUS LANCETS 26G MISC 1 each by Does not apply route every 8 (eight) hours as needed.   TRULICITY 4.5 MG/0.5ML SOPN INJECT 4.5MG  (0.5ML) UNDER THE SKIN ONCE A WEEK     Allergies:   Lisinopril and Pioglitazone   Social History   Socioeconomic History   Marital status: Single    Spouse name: Not on file   Number of children: Not on file   Years of education: Not on file   Highest education level: Not on file  Occupational  History   Not on file  Tobacco Use   Smoking status: Never   Smokeless tobacco: Never  Vaping Use   Vaping status: Never Used  Substance and Sexual Activity   Alcohol use: Not Currently   Drug use: Never   Sexual activity: Not Currently    Partners: Female    Comment: SINGLE  Other Topics Concern   Not on file  Social History Narrative   Not on file   Social Drivers of Health   Financial Resource Strain: Medium Risk (04/07/2023)   Overall Financial Resource Strain (CARDIA)    Difficulty of Paying Living Expenses: Somewhat hard  Food Insecurity: Food Insecurity Present (04/07/2023)   Hunger Vital Sign    Worried About Running Out of Food in the Last Year: Never true    Ran Out of Food in the Last Year: Sometimes true  Transportation Needs: No Transportation Needs (04/07/2023)   PRAPARE - Administrator, Civil Service (Medical): No    Lack of Transportation (Non-Medical): No  Physical Activity: Inactive (04/07/2023)   Exercise Vital Sign    Days of Exercise per Week: 0 days    Minutes of Exercise per Session: 0 min  Stress: Stress Concern Present (04/07/2023)   [employer] of Occupational Health - Occupational Stress Questionnaire    Feeling of Stress : To some extent  Social Connections: Moderately Isolated (04/07/2023)   Social Connection and Isolation Panel [NHANES]    Frequency of Communication with Friends and Family: Once a week    Frequency of Social Gatherings with Friends and Family: Never    Attends Religious Services: 1 to 4 times per year    Active Member of [employer] or Organizations: Yes    Attends Banker Meetings: Never    Marital Status: Never married     Family History: The patient's family history includes Diabetes in his maternal uncle; Heart disease in his maternal aunt.  ROS:   Please see the history of present illness.     All other systems reviewed and are negative.  EKGs/Labs/Other Studies Reviewed:    The following studies  were reviewed today:   EKG:   01/14/2023: Normal sinus rhythm, right bundle branch block, rate 73  Recent Labs: 10/22/2022: Hemoglobin 14.6; Platelets 210 11/19/2022: ALT 19 04/07/2023: BUN 21; Creatinine, Ser 1.29; Potassium 4.9; Sodium 145  Recent Lipid Panel    Component Value Date/Time   CHOL 201 (H) 11/19/2022 1121   TRIG 115 11/19/2022 1121   HDL 61 11/19/2022 1121   CHOLHDL 4.4 04/17/2022 0915   CHOLHDL 3.5 06/07/2015 1005   VLDL 33 (H) 06/07/2015 1005   LDLCALC 120 (H) 11/19/2022 1121    Physical Exam:  VS:  BP 135/78   Pulse 82   Ht 6\' 4"  (1.93 m)   Wt (!) 363 lb 6.4 oz (164.8 kg)   SpO2 90%   BMI 44.23 kg/m     Wt Readings from Last 3 Encounters:  05/01/23 (!) 363 lb 6.4 oz (164.8 kg)  04/07/23 (!) 376 lb 3.2 oz (170.6 kg)  01/14/23 (!) 387 lb (175.5 kg)     GEN:  Well nourished, well developed in no acute distress HEENT: Normal NECK: No JVD; No carotid bruits LYMPHATICS: No lymphadenopathy CARDIAC: RRR, no murmurs, rubs, gallops RESPIRATORY:  Clear to auscultation without rales, wheezing or rhonchi  ABDOMEN: Soft, non-tender, non-distended MUSCULOSKELETAL: 1+  edema SKIN: Warm and dry NEUROLOGIC:  Alert and oriented x 3 PSYCHIATRIC:  Normal affect   ASSESSMENT:    1. Hyperlipidemia, unspecified hyperlipidemia type   2. History of open leg wound   3. Chest pain of uncertain etiology   4. Essential hypertension     PLAN:    Chest pain: Atypical in description as describes right-sided pain but does report can occur with exertion suggesting possible angina.  Does have multiple CAD risk factors (age, hypertension, hyperlipidemia, T2DM). -Not a good coronary CTA candidate given BMI.  Recommended stress PET.  Discussed with patient, but he declined testing due to being self-pay.  Our social worker has reached out to him and he has qualified for financial assistance.  However he reports his chest pain has resolved since he lost weight.  Will hold off on  stress test for now, can revisit if recurrent chest pain  Hyperlipidemia: Has tried multiple statins but discontinued due to myalgias.  Currently on Zetia 10 mg daily.  LDL 120 on 11/19/2022.  Refer to pharmacy lipid clinic  Hypertension: On amlodipine 5 mg daily.  Appears controlled  T2DM: On Jardiance, glipizide, insulin.  Most recent A1c improved, 6.8% on 04/07/2023  Leg wound: Has been following with wound clinic for diabetic ulcers in setting of venous insufficiency.  Does report discomfort in legs with walking, recommend checking ABIs   Suspected OSA: has upcoming sleep study.  RTC in 6 months  Medication Adjustments/Labs and Tests Ordered: Current medicines are reviewed at length with the patient today.  Concerns regarding medicines are outlined above.  Orders Placed This Encounter  Procedures   AMB Referral to [facility] Pharm-D   VAS [place] ABI WITH/WO TBI   No orders of the defined types were placed in this encounter.   Patient Instructions  Medication Instructions:  Continue current medication *If you need a refill on your cardiac medications before your next appointment, please call your pharmacy*   Lab Work: none If you have labs (blood work) drawn today and your tests are completely normal, you will receive your results only by: MyChart Message (if you have MyChart) OR A paper copy in the mail If you have any lab test that is abnormal or we need to change your treatment, we will call you to review the results.   Testing/Procedures: ABI  Your physician has requested that you have an ankle brachial index (ABI). During this test an ultrasound and blood pressure cuff are used to evaluate the arteries that supply the arms and legs with blood. Allow thirty minutes for this exam. There are no restrictions or special instructions. This will take place at [address] [facility], [address].    Please note: We ask at that you not bring children with you during ultrasound (echo/  vascular) testing. Due  to room size and safety concerns, children are not allowed in the ultrasound rooms during exams. Our front office staff cannot provide observation of children in our lobby area while testing is being conducted. An adult accompanying a patient to their appointment will only be allowed in the ultrasound room at the discretion of the ultrasound technician under special circumstances. We apologize for any inconvenience.    Follow-Up: At [facility], you and your health needs are our priority.  As part of our continuing mission to provide you with exceptional heart care, we have created designated Provider Care Teams.  These Care Teams include your primary Cardiologist (physician) and Advanced Practice Providers (APPs -  Physician Assistants and Nurse Practitioners) who all work together to provide you with the care you need, when you need it.  We recommend signing up for the patient portal called "MyChart".  Sign up information is provided on this After Visit Summary.  MyChart is used to connect with patients for Virtual Visits (Telemedicine).  Patients are able to view lab/test results, encounter notes, upcoming appointments, etc.  Non-urgent messages can be sent to your provider as well.   To learn more about what you can do with MyChart, go to [email or web address].    Your next appointment:   6 month(s)  Provider:   Little Ishikawa, MD     Other Instructions Referral to Pharm D - hyperlipidemia       Signed, Little Ishikawa, MD  05/01/2023 12:25 PM    [facility]

## 2023-05-01 ENCOUNTER — Ambulatory Visit: Payer: Self-pay | Attending: Cardiology | Admitting: Cardiology

## 2023-05-01 ENCOUNTER — Encounter: Payer: Self-pay | Admitting: Cardiology

## 2023-05-01 VITALS — BP 135/78 | HR 82 | Ht 76.0 in | Wt 363.4 lb

## 2023-05-01 DIAGNOSIS — I10 Essential (primary) hypertension: Secondary | ICD-10-CM

## 2023-05-01 DIAGNOSIS — E785 Hyperlipidemia, unspecified: Secondary | ICD-10-CM

## 2023-05-01 DIAGNOSIS — Z87828 Personal history of other (healed) physical injury and trauma: Secondary | ICD-10-CM

## 2023-05-01 DIAGNOSIS — R079 Chest pain, unspecified: Secondary | ICD-10-CM

## 2023-05-01 NOTE — Patient Instructions (Signed)
 Medication Instructions:  Continue current medication *If you need a refill on your cardiac medications before your next appointment, please call your pharmacy*   Lab Work: none If you have labs (blood work) drawn today and your tests are completely normal, you will receive your results only by: MyChart Message (if you have MyChart) OR A paper copy in the mail If you have any lab test that is abnormal or we need to change your treatment, we will call you to review the results.   Testing/Procedures: ABI  Your physician has requested that you have an ankle brachial index (ABI). During this test an ultrasound and blood pressure cuff are used to evaluate the arteries that supply the arms and legs with blood. Allow thirty minutes for this exam. There are no restrictions or special instructions. This will take place at [address] [facility], [address].    Please note: We ask at that you not bring children with you during ultrasound (echo/ vascular) testing. Due to room size and safety concerns, children are not allowed in the ultrasound rooms during exams. Our front office staff cannot provide observation of children in our lobby area while testing is being conducted. An adult accompanying a patient to their appointment will only be allowed in the ultrasound room at the discretion of the ultrasound technician under special circumstances. We apologize for any inconvenience.    Follow-Up: At [facility], you and your health needs are our priority.  As part of our continuing mission to provide you with exceptional heart care, we have created designated Provider Care Teams.  These Care Teams include your primary Cardiologist (physician) and Advanced Practice Providers (APPs -  Physician Assistants and Nurse Practitioners) who all work together to provide you with the care you need, when you need it.  We recommend signing up for the patient portal called "MyChart".  Sign up information is  provided on this After Visit Summary.  MyChart is used to connect with patients for Virtual Visits (Telemedicine).  Patients are able to view lab/test results, encounter notes, upcoming appointments, etc.  Non-urgent messages can be sent to your provider as well.   To learn more about what you can do with MyChart, go to [email or web address].    Your next appointment:   6 month(s)  Provider:   Little Ishikawa, MD     Other Instructions Referral to Pharm D - hyperlipidemia

## 2023-05-14 ENCOUNTER — Other Ambulatory Visit: Payer: Self-pay

## 2023-05-14 ENCOUNTER — Other Ambulatory Visit: Payer: Self-pay | Admitting: Family Medicine

## 2023-05-14 DIAGNOSIS — E1142 Type 2 diabetes mellitus with diabetic polyneuropathy: Secondary | ICD-10-CM

## 2023-05-14 DIAGNOSIS — I152 Hypertension secondary to endocrine disorders: Secondary | ICD-10-CM

## 2023-05-14 MED ORDER — TRUE METRIX BLOOD GLUCOSE TEST VI STRP
ORAL_STRIP | 0 refills | Status: DC
  Filled 2023-05-14 (×2): qty 100, 30d supply, fill #0

## 2023-05-14 MED ORDER — AMLODIPINE BESYLATE 5 MG PO TABS
5.0000 mg | ORAL_TABLET | Freq: Every day | ORAL | 1 refills | Status: DC
Start: 2023-05-14 — End: 2023-05-20
  Filled 2023-05-14: qty 90, 90d supply, fill #0
  Filled 2023-05-17: qty 90, 90d supply, fill #1

## 2023-05-14 MED ORDER — TRUEPLUS 5-BEVEL PEN NEEDLES 32G X 4 MM MISC
2 refills | Status: AC
  Filled 2023-05-14: qty 100, 50d supply, fill #0

## 2023-05-14 NOTE — Telephone Encounter (Signed)
 Requested Prescriptions  Pending Prescriptions Disp Refills   amLODipine (NORVASC) 5 MG tablet 90 tablet 1    Sig: Take 1 tablet (5 mg total) by mouth daily.     Cardiovascular: Calcium Channel Blockers 2 Passed - 05/14/2023  2:01 PM      Passed - Last BP in normal range    BP Readings from Last 1 Encounters:  05/01/23 135/78         Passed - Last Heart Rate in normal range    Pulse Readings from Last 1 Encounters:  05/01/23 82         Passed - Valid encounter within last 6 months    Recent Outpatient Visits           1 month ago Type 2 diabetes mellitus with diabetic polyneuropathy, with long-term current use of insulin (HCC)   [facility] [place] [facility] Dept Of [place]. [facility] Hoy Register, MD   5 months ago Statin myopathy   [facility] [place] [facility] [place] - A Dept Of [place]. [facility] Hoy Register, MD   6 months ago Left leg cellulitis   [facility] [facility] [place] - A Dept Of [place]. [facility] [place], [place] W, NP   10 months ago Type 2 diabetes mellitus with diabetic polyneuropathy, with long-term current use of insulin (HCC)   [place] [facility] [place] - A Dept Of [place]. [facility] Hoy Register, MD   1 year ago Type 2 diabetes mellitus with diabetic polyneuropathy, with long-term current use of insulin (HCC)   [place] [facility] [place] - A Dept Of [place]. [facility] [place], Marzella Schlein, [place]       Future Appointments             In 6 days Hoy Register, MD [facility] - A Dept Of [place]. [facility]   In 3 weeks [place], [place], [facility] [place] [facility] at [facility]             Insulin Pen Needle (TRUEPLUS 5-BEVEL PEN NEEDLES) 32G X 4 MM MISC 100 each 2    Sig: use as directed to inject insulin twice daily.     Endocrinology: Diabetes - Testing Supplies Passed - 05/14/2023  2:01 PM      Passed - Valid encounter  within last 12 months    Recent Outpatient Visits           1 month ago Type 2 diabetes mellitus with diabetic polyneuropathy, with long-term current use of insulin (HCC)   [place] [facility] [place] - [facility] Dept Of [place]. [facility] Hoy Register, MD   5 months ago Statin myopathy   [place] [facility] [place] - A Dept Of [place]. [facility] Hoy Register, MD   6 months ago Left leg cellulitis   [place] [facility] [place] - A Dept Of [place]. [facility] [place], [place] W, NP   10 months ago Type 2 diabetes mellitus with diabetic polyneuropathy, with long-term current use of insulin (HCC)   [place] [facility] - A Dept Of [place]. [facility] Hoy Register, MD   1 year ago Type 2 diabetes mellitus with diabetic polyneuropathy, with long-term current use of insulin (HCC)   [place] [facility] [place] - A Dept Of . [facility] [place], [place],  PA-C       Future Appointments             In 6 days Hoy Register, MD [facility] [place] - A Dept Of [place]. [facility]   In 3 weeks Pavero, [place], [facility] [facility] at [facility]

## 2023-05-17 ENCOUNTER — Other Ambulatory Visit: Payer: Self-pay | Admitting: Family Medicine

## 2023-05-18 ENCOUNTER — Other Ambulatory Visit: Payer: Self-pay

## 2023-05-18 MED ORDER — MELOXICAM 15 MG PO TABS
15.0000 mg | ORAL_TABLET | Freq: Every day | ORAL | 0 refills | Status: DC
  Filled 2023-05-18: qty 90, 90d supply, fill #0

## 2023-05-20 ENCOUNTER — Ambulatory Visit: Payer: Self-pay | Attending: Family Medicine | Admitting: Family Medicine

## 2023-05-20 ENCOUNTER — Encounter: Payer: Self-pay | Admitting: Family Medicine

## 2023-05-20 ENCOUNTER — Other Ambulatory Visit: Payer: Self-pay

## 2023-05-20 VITALS — BP 128/74 | HR 70 | Ht 76.0 in | Wt 358.6 lb

## 2023-05-20 DIAGNOSIS — N401 Enlarged prostate with lower urinary tract symptoms: Secondary | ICD-10-CM

## 2023-05-20 DIAGNOSIS — N400 Enlarged prostate without lower urinary tract symptoms: Secondary | ICD-10-CM | POA: Insufficient documentation

## 2023-05-20 DIAGNOSIS — Z7984 Long term (current) use of oral hypoglycemic drugs: Secondary | ICD-10-CM | POA: Insufficient documentation

## 2023-05-20 DIAGNOSIS — E1159 Type 2 diabetes mellitus with other circulatory complications: Secondary | ICD-10-CM | POA: Insufficient documentation

## 2023-05-20 DIAGNOSIS — E669 Obesity, unspecified: Secondary | ICD-10-CM | POA: Insufficient documentation

## 2023-05-20 DIAGNOSIS — T426X5A Adverse effect of other antiepileptic and sedative-hypnotic drugs, initial encounter: Secondary | ICD-10-CM

## 2023-05-20 DIAGNOSIS — Z5986 Financial insecurity: Secondary | ICD-10-CM | POA: Insufficient documentation

## 2023-05-20 DIAGNOSIS — E11649 Type 2 diabetes mellitus with hypoglycemia without coma: Secondary | ICD-10-CM | POA: Insufficient documentation

## 2023-05-20 DIAGNOSIS — E1169 Type 2 diabetes mellitus with other specified complication: Secondary | ICD-10-CM

## 2023-05-20 DIAGNOSIS — I10 Essential (primary) hypertension: Secondary | ICD-10-CM

## 2023-05-20 DIAGNOSIS — Z7985 Long-term (current) use of injectable non-insulin antidiabetic drugs: Secondary | ICD-10-CM | POA: Insufficient documentation

## 2023-05-20 DIAGNOSIS — I152 Hypertension secondary to endocrine disorders: Secondary | ICD-10-CM | POA: Insufficient documentation

## 2023-05-20 DIAGNOSIS — R0902 Hypoxemia: Secondary | ICD-10-CM

## 2023-05-20 DIAGNOSIS — Z79899 Other long term (current) drug therapy: Secondary | ICD-10-CM | POA: Insufficient documentation

## 2023-05-20 DIAGNOSIS — Z794 Long term (current) use of insulin: Secondary | ICD-10-CM | POA: Insufficient documentation

## 2023-05-20 DIAGNOSIS — E119 Type 2 diabetes mellitus without complications: Secondary | ICD-10-CM

## 2023-05-20 DIAGNOSIS — E1142 Type 2 diabetes mellitus with diabetic polyneuropathy: Secondary | ICD-10-CM

## 2023-05-20 DIAGNOSIS — M62838 Other muscle spasm: Secondary | ICD-10-CM

## 2023-05-20 DIAGNOSIS — E785 Hyperlipidemia, unspecified: Secondary | ICD-10-CM | POA: Insufficient documentation

## 2023-05-20 DIAGNOSIS — L719 Rosacea, unspecified: Secondary | ICD-10-CM | POA: Insufficient documentation

## 2023-05-20 DIAGNOSIS — Z6841 Body Mass Index (BMI) 40.0 and over, adult: Secondary | ICD-10-CM | POA: Insufficient documentation

## 2023-05-20 LAB — POCT GLYCOSYLATED HEMOGLOBIN (HGB A1C): HbA1c, POC (controlled diabetic range): 6.9 % (ref 0.0–7.0)

## 2023-05-20 MED ORDER — FINASTERIDE 5 MG PO TABS
5.0000 mg | ORAL_TABLET | Freq: Every day | ORAL | 1 refills | Status: DC
  Filled 2023-05-20 – 2023-06-17 (×2): qty 90, 90d supply, fill #0
  Filled 2023-09-28: qty 90, 90d supply, fill #1

## 2023-05-20 MED ORDER — GLIPIZIDE 10 MG PO TABS
ORAL_TABLET | ORAL | 6 refills | Status: DC
  Filled 2023-05-20 – 2023-05-22 (×2): qty 45, 30d supply, fill #0
  Filled 2023-06-17: qty 45, 30d supply, fill #1
  Filled 2023-07-13 – 2023-07-23 (×2): qty 45, 30d supply, fill #2
  Filled 2023-08-25 – 2023-08-26 (×2): qty 45, 30d supply, fill #3
  Filled 2023-09-22: qty 45, 30d supply, fill #0
  Filled 2023-09-22: qty 45, 30d supply, fill #4
  Filled 2023-10-21: qty 45, 30d supply, fill #1
  Filled 2023-11-24 (×2): qty 45, 30d supply, fill #2

## 2023-05-20 MED ORDER — BASAGLAR KWIKPEN 100 UNIT/ML ~~LOC~~ SOPN
38.0000 [IU] | PEN_INJECTOR | Freq: Two times a day (BID) | SUBCUTANEOUS | 6 refills | Status: DC
  Filled 2023-05-20: qty 30, 39d supply, fill #0

## 2023-05-20 MED ORDER — TAMSULOSIN HCL 0.4 MG PO CAPS
0.4000 mg | ORAL_CAPSULE | Freq: Every day | ORAL | 1 refills | Status: DC
  Filled 2023-05-20: qty 90, 90d supply, fill #0
  Filled 2023-08-11: qty 90, 90d supply, fill #1
  Filled 2023-08-11: qty 90, 90d supply, fill #0

## 2023-05-20 MED ORDER — EZETIMIBE 10 MG PO TABS
10.0000 mg | ORAL_TABLET | Freq: Every day | ORAL | 1 refills | Status: AC
  Filled 2023-05-20: qty 90, 90d supply, fill #0

## 2023-05-20 MED ORDER — TRULICITY 4.5 MG/0.5ML ~~LOC~~ SOAJ
4.5000 mg | SUBCUTANEOUS | 6 refills | Status: DC
  Filled 2023-05-20: qty 8, 112d supply, fill #0
  Filled 2023-05-20: qty 2, 28d supply, fill #0

## 2023-05-20 MED ORDER — EMPAGLIFLOZIN 25 MG PO TABS
25.0000 mg | ORAL_TABLET | Freq: Every day | ORAL | 1 refills | Status: DC
  Filled 2023-05-20: qty 90, 90d supply, fill #0

## 2023-05-20 MED ORDER — OMEGA-3-ACID ETHYL ESTERS 1 G PO CAPS
1.0000 | ORAL_CAPSULE | Freq: Two times a day (BID) | ORAL | 2 refills | Status: AC
  Filled 2023-05-20: qty 180, 90d supply, fill #0
  Filled 2023-05-22: qty 60, 30d supply, fill #0
  Filled 2023-07-23: qty 60, 30d supply, fill #1
  Filled 2023-08-25 – 2023-08-26 (×2): qty 60, 30d supply, fill #2
  Filled 2023-09-22: qty 60, 30d supply, fill #0
  Filled 2023-09-22: qty 60, 30d supply, fill #3
  Filled 2023-10-21: qty 60, 30d supply, fill #1
  Filled 2023-11-23: qty 60, 30d supply, fill #2
  Filled 2023-12-21: qty 60, 30d supply, fill #3
  Filled 2024-01-17: qty 60, 30d supply, fill #4
  Filled 2024-02-29: qty 60, 30d supply, fill #5

## 2023-05-20 MED ORDER — AMLODIPINE BESYLATE 5 MG PO TABS
5.0000 mg | ORAL_TABLET | Freq: Every day | ORAL | 1 refills | Status: DC
Start: 2023-05-20 — End: 2023-11-23
  Filled 2023-05-20 – 2023-08-11 (×3): qty 90, 90d supply, fill #0
  Filled 2023-11-15: qty 90, 90d supply, fill #1

## 2023-05-20 MED ORDER — GABAPENTIN 300 MG PO CAPS
300.0000 mg | ORAL_CAPSULE | Freq: Every day | ORAL | 6 refills | Status: DC
  Filled 2023-05-20 – 2023-06-21 (×2): qty 90, 90d supply, fill #0
  Filled 2023-07-23: qty 90, 90d supply, fill #1

## 2023-05-20 NOTE — Patient Instructions (Signed)
 VISIT SUMMARY:  During your follow-up visit, we discussed your diabetes management, recent weight loss, and episodes of low blood sugar. We also addressed your concerns about feeling sleepy with gabapentin, your rosacea treatment, and your cholesterol management. Additionally, we reviewed your referral to a cardiologist and plans for further testing.  YOUR PLAN:  -TYPE 2 DIABETES MELLITUS: Type 2 Diabetes Mellitus is a condition where your body does not use insulin properly, leading to high blood sugar levels. Your diabetes is well-controlled with an A1c of 6.9, and your recent weight loss has been beneficial. However, due to episodes of low blood sugar, we are reducing your Lantus dose to 36 units twice daily. Please continue to monitor your blood glucose levels regularly. A refill for Trulicity has been sent to your pharmacy.  -? SEIZURE-LIKE ACTIVITY: You had a single episode of muscle spasm while driving, but your CT head scan was normal and there have been no repeat episodes. Please monitor for any new episodes and follow up with the cardiologist for further evaluation.  -GABAPENTIN-RELATED SEDATION: Gabapentin, which you take for nerve pain, has been causing daytime sleepiness. To help with this, take all three doses of Gabapentin at night before bed.  -HYPERLIPIDEMIA: Hyperlipidemia is a condition where you have high levels of fats (lipids) in your blood. Since you cannot tolerate statins, you are taking Ezetimibe (Zetia). Please continue with this medication and follow up with the lipid clinic as scheduled.  -ROSACEA: Rosacea is a skin condition that causes redness and visible blood vessels on your face. The topical gel you are using has been effective, so please continue using it.  INSTRUCTIONS:  Please ensure all necessary medication refills are provided. Schedule a follow-up appointment in six months. Additionally, you are scheduled for a sleep study and vascular test, so coordination  with specialists is important.

## 2023-05-20 NOTE — Progress Notes (Addendum)
 Subjective:  Patient ID: Dillon Mcgee, male    DOB: Jul 17, 1960  Age: 63 y.o. MRN: [id number]  CC: Medical Management of Chronic Issues     Discussed the use of AI scribe software for clinical note transcription with the patient, who gave verbal consent to proceed.  History of Present Illness The patient, with a history of hypertension, type 2 diabetes mellitus (A1c 6.8), hyperlipidemia, statin intolerance, obesity, Rosacea  presents for a follow-up visit. He reports significant weight loss of about thirty pounds over the past six months, which has resulted in increased energy levels. He has been monitoring his blood sugars for the past two months and has noticed some episodes of hypoglycemia. He is currently on Lantus, taking thirty-eight units twice a day blood sugar log reveals some episodes of hypoglycemia between 44 and 60  He also reports feeling sleepy with gabapentin, which he has been taking 3 times daily and was advised to take this at night but he forgot to change the timing.  He has been using metronidazole gel for his rosacea and has noticed some improvement in his skin condition. He is also on Zetia for his cholesterol, as he is unable to take statins.  I had referred him to cardiology for initiation of a PCSK9 inhibitor and he is yet to be commenced on this.  He is taking fish oil capsules, Lovaza, and Trulicity once a week for his diabetes.  He had undergone further evaluation due to concerns about hypoxia of 84% at his last visit and questionable seizure-like activity manifested by uncontrolled muscle spasms and weakness while driving.  CT head was negative.  Oxygen saturation today is back up to 94%. He has not had any repeat episodes of uncontrolled muscle spasms or disorientation while driving.    Past Medical History:  Diagnosis Date   Diabetes mellitus without complication (HCC)    Hyperlipidemia    Hypertension     Past Surgical History:  Procedure Laterality  Date   COLONOSCOPY     5 years ago     Family History  Problem Relation Age of Onset   Heart disease Maternal Aunt    Diabetes Maternal Uncle     Social History   Socioeconomic History   Marital status: Single    Spouse name: Not on file   Number of children: Not on file   Years of education: Not on file   Highest education level: Not on file  Occupational History   Not on file  Tobacco Use   Smoking status: Never   Smokeless tobacco: Never  Vaping Use   Vaping status: Never Used  Substance and Sexual Activity   Alcohol use: Not Currently   Drug use: Never   Sexual activity: Not Currently    Partners: Female    Comment: SINGLE  Other Topics Concern   Not on file  Social History Narrative   Not on file   Social Drivers of Health   Financial Resource Strain: Medium Risk (04/07/2023)   Overall Financial Resource Strain (CARDIA)    Difficulty of Paying Living Expenses: Somewhat hard  Food Insecurity: Food Insecurity Present (04/07/2023)   Hunger Vital Sign    Worried About Running Out of Food in the Last Year: Never true    Ran Out of Food in the Last Year: Sometimes true  Transportation Needs: No Transportation Needs (04/07/2023)   PRAPARE - Administrator, Civil Service (Medical): No    Lack of Transportation (Non-Medical): No  Physical Activity: Inactive (04/07/2023)   Exercise Vital Sign    Days of Exercise per Week: 0 days    Minutes of Exercise per Session: 0 min  Stress: Stress Concern Present (04/07/2023)   [employer] of Occupational Health - Occupational Stress Questionnaire    Feeling of Stress : To some extent  Social Connections: Moderately Isolated (04/07/2023)   Social Connection and Isolation Panel [NHANES]    Frequency of Communication with Friends and Family: Once a week    Frequency of Social Gatherings with Friends and Family: Never    Attends Religious Services: 1 to 4 times per year    Active Member of [employer] or Organizations: Yes     Attends Banker Meetings: Never    Marital Status: Never married    Allergies  Allergen Reactions   Lisinopril Other (See Comments)    Hyperkalemia    Pioglitazone Swelling    Lower extremity swelling.    Outpatient Medications Prior to Visit  Medication Sig Dispense Refill   aspirin EC 81 MG tablet Take 1 tablet (81 mg total) daily by mouth. 90 tablet 3   Blood Glucose Monitoring Suppl (TRUE METRIX GO GLUCOSE METER) w/Device KIT 1 each by Does not apply route every 8 (eight) hours as needed. 1 kit 0   calcipotriene-betamethasone (TACLONEX) ointment Apply ointment to affected area(s) daily. 100 g 1   cholecalciferol (VITAMIN D3) 25 MCG (1000 UNIT) tablet Take 1,000 Units by mouth daily.     doxycycline (VIBRA-TABS) 100 MG tablet Take 1 tablet (100 mg total) by mouth 2 (two) times daily. 20 tablet 0   ferrous sulfate 325 (65 FE) MG tablet Take 325 mg by mouth daily with breakfast.     glucose blood (TRUE METRIX BLOOD GLUCOSE TEST) test strip Use as instructed 3 times daily. 100 strip 0   Insulin Pen Needle (TRUEPLUS 5-BEVEL PEN NEEDLES) 32G X 4 MM MISC use as directed to inject insulin twice daily. 100 each 2   Insulin Syringe-Needle U-100 (TRUEPLUS INSULIN SYRINGE) 30G X 5/16" 0.5 ML MISC Use as directed to inject Levemir twice daily. 100 each 1   meloxicam (MOBIC) 15 MG tablet Take 1 tablet (15 mg total) by mouth daily. 90 tablet 0   metroNIDAZOLE (METROGEL) 1 % gel Apply topically daily. 60 g 3   TRUEPLUS LANCETS 26G MISC 1 each by Does not apply route every 8 (eight) hours as needed. 100 each 12   amLODipine (NORVASC) 5 MG tablet Take 1 tablet (5 mg total) by mouth daily. 90 tablet 1   empagliflozin (JARDIANCE) 25 MG TABS tablet Take 1 tablet (25 mg total) by mouth daily before breakfast. 90 tablet 1   ezetimibe (ZETIA) 10 MG tablet Take 1 tablet (10 mg total) by mouth daily. 90 tablet 1   finasteride (PROSCAR) 5 MG tablet Take 1 tablet (5 mg total) by mouth daily. 90  tablet 0   gabapentin (NEURONTIN) 300 MG capsule Take 1 capsule (300 mg total) by mouth 3 (three) times daily. 90 capsule 6   glipiZIDE (GLUCOTROL) 10 MG tablet Take 1 tablet (10 mg total) by mouth in the morning AND 0.5 tablets (5 mg total) every evening. 45 tablet 6   Insulin Glargine (BASAGLAR KWIKPEN) 100 UNIT/ML Inject 40 Units into the skin 2 (two) times daily. 30 mL 6   omega-3 acid ethyl esters (LOVAZA) 1 g capsule TAKE 1 CAPSULE BY MOUTH 2 TIMES DAILY. 180 capsule 2   tamsulosin (FLOMAX) 0.4 MG CAPS  capsule Take 1 capsule (0.4 mg total) by mouth daily. 90 capsule 1   TRULICITY 4.5 MG/0.5ML SOPN INJECT 4.5MG  (0.5ML) UNDER THE SKIN ONCE A WEEK 8 mL 0   No facility-administered medications prior to visit.     ROS Review of Systems  Constitutional:  Negative for activity change and appetite change.  HENT:  Negative for sinus pressure and sore throat.   Respiratory:  Negative for chest tightness, shortness of breath and wheezing.   Cardiovascular:  Negative for chest pain and palpitations.  Gastrointestinal:  Negative for abdominal distention, abdominal pain and constipation.  Genitourinary: Negative.   Musculoskeletal: Negative.   Psychiatric/Behavioral:  Negative for behavioral problems and dysphoric mood.     Objective:  BP 128/74   Pulse 70   Ht 6\' 4"  (1.93 m)   Wt (!) 358 lb 9.6 oz (162.7 kg)   SpO2 94%   BMI 43.65 kg/m      05/20/2023    9:05 AM 05/01/2023   11:47 AM 04/07/2023    2:52 PM  BP/Weight  Systolic BP 128 135 132  Diastolic BP 74 78 69  Wt. (Lbs) 358.6 363.4 376.2  BMI 43.65 kg/m2 44.23 kg/m2 45.79 kg/m2    Wt Readings from Last 3 Encounters:  05/20/23 (!) 358 lb 9.6 oz (162.7 kg)  05/01/23 (!) 363 lb 6.4 oz (164.8 kg)  04/07/23 (!) 376 lb 3.2 oz (170.6 kg)      Physical Exam Constitutional:      Appearance: He is well-developed.  Cardiovascular:     Rate and Rhythm: Normal rate.     Heart sounds: Normal heart sounds. No murmur  heard. Pulmonary:     Effort: Pulmonary effort is normal.     Breath sounds: Normal breath sounds. No wheezing or rales.  Chest:     Chest wall: No tenderness.  Abdominal:     General: Bowel sounds are normal. There is distension.     Palpations: Abdomen is soft. There is no mass.     Tenderness: There is no abdominal tenderness.  Musculoskeletal:        General: Normal range of motion.     Right lower leg: No edema.     Left lower leg: No edema.  Neurological:     Mental Status: He is alert and oriented to person, place, and time.  Psychiatric:        Mood and Affect: Mood normal.        Latest Ref Rng & Units 04/07/2023    3:44 PM 11/19/2022   11:21 AM 04/17/2022    9:15 AM  CMP  Glucose 70 - 99 mg/dL 161  54  60   BUN 8 - 27 mg/dL 21  14  16    Creatinine 0.76 - 1.27 mg/dL 0.96  0.45  4.09   Sodium 134 - 144 mmol/L 145  147  146   Potassium 3.5 - 5.2 mmol/L 4.9  5.2  4.8   Chloride 96 - 106 mmol/L 102  104  106   CO2 20 - 29 mmol/L 28  22  25    Calcium 8.6 - 10.2 mg/dL 9.4  9.5  9.4   Total Protein 6.0 - 8.5 g/dL  7.4  6.7   Total Bilirubin 0.0 - 1.2 mg/dL  0.4  0.3   Alkaline Phos 44 - 121 IU/L  92  86   AST 0 - 40 IU/L  31  20   ALT 0 - 44 IU/L  19  24     Lipid Panel     Component Value Date/Time   CHOL 201 (H) 11/19/2022 1121   TRIG 115 11/19/2022 1121   HDL 61 11/19/2022 1121   CHOLHDL 4.4 04/17/2022 0915   CHOLHDL 3.5 06/07/2015 1005   VLDL 33 (H) 06/07/2015 1005   LDLCALC 120 (H) 11/19/2022 1121    CBC    Component Value Date/Time   WBC 4.7 10/22/2022 1035   WBC 7.4 12/03/2015 1704   RBC 4.72 10/22/2022 1035   RBC 4.17 (L) 12/03/2015 1704   HGB 14.6 10/22/2022 1035   HCT 44.8 10/22/2022 1035   PLT 210 10/22/2022 1035   MCV 95 10/22/2022 1035   MCH 30.9 10/22/2022 1035   MCH 30.2 12/03/2015 1704   MCHC 32.6 10/22/2022 1035   MCHC 32.4 12/03/2015 1704   RDW 11.8 10/22/2022 1035   LYMPHSABS 1.4 10/22/2022 1035   MONOABS 518 12/03/2015 1704    EOSABS 0.1 10/22/2022 1035   BASOSABS 0.0 10/22/2022 1035    Lab Results  Component Value Date   HGBA1C 6.9 05/20/2023       Assessment & Plan Type 2 Diabetes Mellitus Diabetes well-controlled with A1c of 6.9. Weight loss beneficial. Hypoglycemia episodes require Lantus adjustment. - Reduce Lantus to 36 units twice daily. - Monitor blood glucose levels regularly. - Send a refill for Trulicity to the pharmacy.  Muscle spasm with hypoxia Single episode of muscle spasm while driving. CT head normal. No repeat episodes. -Oxygen saturation is back to baseline - Monitor for any new episodes. ? Seizure but will consider Neuro referral with reoccurrence - Follow up with cardiologist for further evaluation.  Gabapentin-related sedation Gabapentin causes daytime sedation. Advised to take doses at night to improve functioning. - Take all three doses of Gabapentin at night before bed.  Hyperlipidemia Intolerant to statins. On Ezetimibe. Referred to lipid clinic. - Continue Ezetimibe (Zetia). - Follow up with the lipid clinic as scheduled.  Rosacea Topical gel effective with improved facial appearance. - Continue using the topical gel for rosacea.  Hypertension associated with diabetes -Controlled -Continue antihypertensives. -Counseled on blood pressure goal of less than 130/80, low-sodium, DASH diet, medication compliance, 150 minutes of moderate intensity exercise per week. Discussed medication compliance, adverse effects.  BPH -Stable on Flomax  Follow-up Scheduled for sleep study and vascular test. Coordination with specialists important. - Ensure all necessary medication refills are provided. - Schedule follow-up in six months.      Meds ordered this encounter  Medications   Insulin Glargine (BASAGLAR KWIKPEN) 100 UNIT/ML    Sig: Inject 38 Units into the skin 2 (two) times daily.    Dispense:  30 mL    Refill:  6   amLODipine (NORVASC) 5 MG tablet    Sig: Take 1  tablet (5 mg total) by mouth daily.    Dispense:  90 tablet    Refill:  1   empagliflozin (JARDIANCE) 25 MG TABS tablet    Sig: Take 1 tablet (25 mg total) by mouth daily before breakfast.    Dispense:  90 tablet    Refill:  1   ezetimibe (ZETIA) 10 MG tablet    Sig: Take 1 tablet (10 mg total) by mouth daily.    Dispense:  90 tablet    Refill:  1   finasteride (PROSCAR) 5 MG tablet    Sig: Take 1 tablet (5 mg total) by mouth daily.    Dispense:  90 tablet    Refill:  1   gabapentin (NEURONTIN) 300 MG capsule    Sig: Take 1 capsule (300 mg total) by mouth at bedtime.    Dispense:  90 capsule    Refill:  6   glipiZIDE (GLUCOTROL) 10 MG tablet    Sig: Take 1 tablet (10 mg total) by mouth in the morning AND 0.5 tablets (5 mg total) every evening.    Dispense:  45 tablet    Refill:  6   omega-3 acid ethyl esters (LOVAZA) 1 g capsule    Sig: TAKE 1 CAPSULE BY MOUTH 2 TIMES DAILY.    Dispense:  180 capsule    Refill:  2   tamsulosin (FLOMAX) 0.4 MG CAPS capsule    Sig: Take 1 capsule (0.4 mg total) by mouth daily.    Dispense:  90 capsule    Refill:  1   Dulaglutide (TRULICITY) 4.5 MG/0.5ML SOAJ    Sig: Inject 4.5 mg into the skin once a week.    Dispense:  8 mL    Refill:  6    Follow-up: Return in about 6 months (around 11/20/2023) for Chronic medical conditions.       Hoy Register, MD, FAAFP. [facility] [place], [place] [phone]   05/20/2023, 12:33 PM

## 2023-05-22 ENCOUNTER — Other Ambulatory Visit: Payer: Self-pay

## 2023-05-29 ENCOUNTER — Other Ambulatory Visit: Payer: Self-pay

## 2023-06-01 ENCOUNTER — Telehealth: Payer: Self-pay

## 2023-06-01 ENCOUNTER — Other Ambulatory Visit: Payer: Self-pay

## 2023-06-01 NOTE — Telephone Encounter (Signed)
 Received notification from [facility] CARES regarding approval for TRULICITY 4MG /0.5ML & BASAGLAR. Patient assistance approved from 06/01/2023 to 03/02/2024.  Medication will ship to [address], [place] [address]  Pt ID: [id number]  Company phone: [phone]

## 2023-06-05 ENCOUNTER — Other Ambulatory Visit: Payer: Self-pay

## 2023-06-09 ENCOUNTER — Encounter: Payer: Self-pay | Admitting: Pharmacist

## 2023-06-09 ENCOUNTER — Ambulatory Visit (INDEPENDENT_AMBULATORY_CARE_PROVIDER_SITE_OTHER): Payer: Medicaid Other | Admitting: Pharmacist

## 2023-06-09 ENCOUNTER — Ambulatory Visit
Admission: RE | Admit: 2023-06-09 | Discharge: 2023-06-09 | Disposition: A | Discharge status: Home / Self Care | Payer: Medicaid Other | Source: Ambulatory Visit | Attending: Cardiology | Admitting: Cardiology

## 2023-06-09 ENCOUNTER — Other Ambulatory Visit: Payer: Self-pay

## 2023-06-09 ENCOUNTER — Ambulatory Visit: Payer: Medicaid Other | Admitting: Pharmacist

## 2023-06-09 VITALS — Ht 76.0 in | Wt 359.4 lb

## 2023-06-09 DIAGNOSIS — Z87828 Personal history of other (healed) physical injury and trauma: Secondary | ICD-10-CM | POA: Insufficient documentation

## 2023-06-09 DIAGNOSIS — T466X5D Adverse effect of antihyperlipidemic and antiarteriosclerotic drugs, subsequent encounter: Secondary | ICD-10-CM

## 2023-06-09 DIAGNOSIS — R079 Chest pain, unspecified: Secondary | ICD-10-CM

## 2023-06-09 DIAGNOSIS — E785 Hyperlipidemia, unspecified: Secondary | ICD-10-CM | POA: Insufficient documentation

## 2023-06-09 DIAGNOSIS — G720 Drug-induced myopathy: Secondary | ICD-10-CM | POA: Insufficient documentation

## 2023-06-09 DIAGNOSIS — E1142 Type 2 diabetes mellitus with diabetic polyneuropathy: Secondary | ICD-10-CM

## 2023-06-09 DIAGNOSIS — Z794 Long term (current) use of insulin: Secondary | ICD-10-CM | POA: Insufficient documentation

## 2023-06-09 DIAGNOSIS — T466X5A Adverse effect of antihyperlipidemic and antiarteriosclerotic drugs, initial encounter: Secondary | ICD-10-CM | POA: Insufficient documentation

## 2023-06-09 MED ORDER — REPATHA SURECLICK 140 MG/ML ~~LOC~~ SOAJ
1.0000 mL | SUBCUTANEOUS | 5 refills | Status: AC
  Filled 2023-06-09 – 2024-01-04 (×4): qty 2, 28d supply, fill #0

## 2023-06-09 NOTE — Patient Instructions (Signed)
 It was nice meeting you today  We would like your LDL (bad cholesterol) to be less than 55  Since you are still having leg pain, you can hold the Zetia (ezetimibe) 10mg  for a week to see if it resolves  The medication we discussed today is called Repatha, which is an injection you would take once every 2 weeks   I will send it in to [facility]. If your copay is high, I will then submit the patient assistance application  Once you start the medication, we would recheck your fasting lipid panel in about 3 months  Laural Golden, PharmD, BCACP, CDCES, CPP [address], [address] [place], [place], [address] Phone: [phone], Fax: [phone]     1st Floor: - Lobby - Registration  - Pharmacy  - Lab - Cafe  2nd Floor: - PV Lab - Diagnostic Testing (echo, CT, nuclear med)  3rd Floor: - Vacant  4th Floor: - TCTS (cardiothoracic surgery) - AFib Clinic - Structural Heart Clinic - Vascular Surgery  - Vascular Ultrasound  5th Floor: - [facility] (general and EP) - Clinical Pharmacy for coumadin, hypertension, lipid, weight-loss medications, and med management appointments    Valet parking services will be available as well.

## 2023-06-09 NOTE — Progress Notes (Signed)
 Patient ID: Dillon Mcgee                 DOB: Jul 19, 1960                    MRN: [id number]     HPI: Dillon Mcgee is a 63 y.o. male patient referred to lipid clinic by Dr Bjorn Pippin. PMH is significant for T2DM, CAD, HLD, HTN and obesity. Patient is statin intolerant.  Patient presents today in good spirits. Has been working on eating healthier and has lost 30# since last November. A1c at goal.    Has tried atorvastatin, rosuvastatin, and pravastatin. All three caused muscle pain in his upper legs. Still has residual pain. Remains on Zetia 10mg  daily.  Patient uninsured. Receives his medications at [facility] and patient assistance foundations.   No smoking history.   Current Medications:  Zetia 10mg  daily  Intolerances:  Pravastatin Rosuvastatin Atorvastatin  Risk Factors:  T2DM Angina HTN HLD  LDL goal: <55   Labs: TC 201. Trigs 115, HDL 61, LDL 120 (11/19/22))  Past Medical History:  Diagnosis Date   Diabetes mellitus without complication (HCC)    Hyperlipidemia    Hypertension     Current Outpatient Medications on File Prior to Visit  Medication Sig Dispense Refill   amLODipine (NORVASC) 5 MG tablet Take 1 tablet (5 mg total) by mouth daily. 90 tablet 1   aspirin EC 81 MG tablet Take 1 tablet (81 mg total) daily by mouth. 90 tablet 3   Blood Glucose Monitoring Suppl (TRUE METRIX GO GLUCOSE METER) w/Device KIT 1 each by Does not apply route every 8 (eight) hours as needed. 1 kit 0   calcipotriene-betamethasone (TACLONEX) ointment Apply ointment to affected area(s) daily. 100 g 1   cholecalciferol (VITAMIN D3) 25 MCG (1000 UNIT) tablet Take 1,000 Units by mouth daily.     doxycycline (VIBRA-TABS) 100 MG tablet Take 1 tablet (100 mg total) by mouth 2 (two) times daily. 20 tablet 0   Dulaglutide (TRULICITY) 4.5 MG/0.5ML SOAJ Inject 4.5 mg into the skin once a week. 8 mL 6   empagliflozin (JARDIANCE) 25 MG TABS tablet Take 1 tablet (25 mg total)  by mouth daily before breakfast. 90 tablet 1   ezetimibe (ZETIA) 10 MG tablet Take 1 tablet (10 mg total) by mouth daily. 90 tablet 1   ferrous sulfate 325 (65 FE) MG tablet Take 325 mg by mouth daily with breakfast.     finasteride (PROSCAR) 5 MG tablet Take 1 tablet (5 mg total) by mouth daily. 90 tablet 1   gabapentin (NEURONTIN) 300 MG capsule Take 1 capsule (300 mg total) by mouth at bedtime. 90 capsule 6   glipiZIDE (GLUCOTROL) 10 MG tablet Take 1 tablet (10 mg total) by mouth in the morning AND 0.5 tablets (5 mg total) every evening. 45 tablet 6   glucose blood (TRUE METRIX BLOOD GLUCOSE TEST) test strip Use as instructed 3 times daily. 100 strip 0   Insulin Glargine (BASAGLAR KWIKPEN) 100 UNIT/ML Inject 38 Units into the skin 2 (two) times daily. 30 mL 6   Insulin Pen Needle (TRUEPLUS 5-BEVEL PEN NEEDLES) 32G X 4 MM MISC use as directed to inject insulin twice daily. 100 each 2   Insulin Syringe-Needle U-100 (TRUEPLUS INSULIN SYRINGE) 30G X 5/16" 0.5 ML MISC Use as directed to inject Levemir twice daily. 100 each 1   meloxicam (MOBIC) 15 MG tablet Take 1 tablet (15 mg total) by mouth daily.  90 tablet 0   metroNIDAZOLE (METROGEL) 1 % gel Apply topically daily. 60 g 3   omega-3 acid ethyl esters (LOVAZA) 1 g capsule TAKE 1 CAPSULE BY MOUTH 2 TIMES DAILY. 180 capsule 2   tamsulosin (FLOMAX) 0.4 MG CAPS capsule Take 1 capsule (0.4 mg total) by mouth daily. 90 capsule 1   TRUEPLUS LANCETS 26G MISC 1 each by Does not apply route every 8 (eight) hours as needed. 100 each 12   No current facility-administered medications on file prior to visit.    Allergies  Allergen Reactions   Lisinopril Other (See Comments)    Hyperkalemia    Pioglitazone Swelling    Lower extremity swelling.    Assessment/Plan:  1. Hyperlipidemia - Patient's most recent LDL 120 which is above goal of <55. Aggressive goal due to history of T2DM and chest pain. Since patient is statin intolerant, recommend starting  PCSK9i.   Using [employer], educated patient on mechanism of action, storage, site selection, administration, and possible adverse effects. Patient voiced understanding. Will send to [facility] and if cost prohibitive, will submit patient assistance application.   Advised he could hold his Zetia for a week to see if muscle pains resolve. Recheck fasting lipid panel in 3 months.  Laural Golden, PharmD, BCACP, CDCES, CPP [address], [address] [place], [place], [address] Phone: [phone], Fax: [phone]

## 2023-06-10 ENCOUNTER — Telehealth: Payer: Self-pay | Admitting: Pharmacist

## 2023-06-10 NOTE — Telephone Encounter (Signed)
Repatha patient assistance application faxed to [employer]

## 2023-06-11 LAB — VAS US ABI WITH/WO TBI
Left ABI: 1.16
Right ABI: 1.2

## 2023-06-17 ENCOUNTER — Other Ambulatory Visit: Payer: Self-pay

## 2023-06-18 ENCOUNTER — Other Ambulatory Visit: Payer: Self-pay

## 2023-06-19 ENCOUNTER — Ambulatory Visit (HOSPITAL_BASED_OUTPATIENT_CLINIC_OR_DEPARTMENT_OTHER): Payer: Medicaid Other | Attending: Family Medicine | Admitting: Internal Medicine

## 2023-06-19 DIAGNOSIS — R29818 Other symptoms and signs involving the nervous system: Secondary | ICD-10-CM

## 2023-06-19 DIAGNOSIS — G4733 Obstructive sleep apnea (adult) (pediatric): Secondary | ICD-10-CM | POA: Diagnosis present

## 2023-06-19 DIAGNOSIS — R0902 Hypoxemia: Secondary | ICD-10-CM | POA: Insufficient documentation

## 2023-06-22 ENCOUNTER — Other Ambulatory Visit: Payer: Self-pay

## 2023-06-23 ENCOUNTER — Telehealth: Payer: Self-pay | Admitting: Pharmacist

## 2023-06-23 DIAGNOSIS — E785 Hyperlipidemia, unspecified: Secondary | ICD-10-CM

## 2023-06-23 DIAGNOSIS — R079 Chest pain, unspecified: Secondary | ICD-10-CM

## 2023-06-23 NOTE — Telephone Encounter (Signed)
 New Message:     Please call, questions about his Repatha  application.

## 2023-06-24 ENCOUNTER — Telehealth: Payer: Self-pay

## 2023-06-24 ENCOUNTER — Other Ambulatory Visit: Payer: Self-pay

## 2023-06-24 NOTE — Telephone Encounter (Signed)
 Contacted patient. Completed incorrect application and refaxed

## 2023-06-24 NOTE — Telephone Encounter (Signed)
 [facility] reached out wanting update on PAP status. Per rep at [employer] page 2 of 4 was signed and dated but the "I consent" bubble wasn't marked. They just need the I consent on page 2 of 4 bubbled in and refaxed. Per [facility] page 2 of 4 has been refaxed to company.

## 2023-06-27 DIAGNOSIS — R29818 Other symptoms and signs involving the nervous system: Secondary | ICD-10-CM

## 2023-06-27 NOTE — Procedures (Signed)
 Dillon Mcgee [facility] [address] [place], [place] [address] Tel: [phone]   Fax: [phone]  Split Night Interpretation  Patient Name:  Dillon Mcgee, Dillon Mcgee Date:  06/19/2023 Referring Physician:  Joaquin Mulberry MD  Indications for Polysomnography The patient is a 63 year old Male who is 6\' 3"  and weighs 350.0 lbs.  His BMI equals 44.0.  A diagnostic polysomnogram was performed to evaluate for -.OSA  After 135.5 minutes of sleep time the patient exhibited sufficient respiratory events qualifying him for a CPAP trial which was then initiated.    Medication  No Data.   Polysomnogram Data A full night polysomnogram was performed recording the standard physiologic parameters including EEG, EOG, EMG, EKG, nasal and oral airflow.  Respiratory parameters of chest and abdominal movements are recorded with Pe[place]-Crystal motion transducers.  Oxygen saturation was recorded by pulse oximetry.    Sleep Architecture The total recording time of the diagnostic portion of the study was 173.9 minutes.  The total sleep time was 135.5 minutes.  During the diagnostic portion of the study, the patient spent 5.2% of total sleep time in Stage N1, 94.8% in Stage N2, 0.0% in Stages N3, and 0.0% in REM.   Sleep latency was 9.4 minutes.  REM latency was - minutes.  Sleep Efficiency was 77.9%.  Wake after Sleep Onset time was 29.0 minutes.   At 12:28:27 AM the patient was placed on PAP treatment and was titrated at pressures ranging from 5* cm/H20 with supplemental oxygen at - up to 15* cm/H20 with supplemental oxygen at O2: 3.  The total recording time of the treatment portion of the study was 232.3 minutes.  The total sleep time was 184.5 minutes.  During the treatment portion of the study, the patient spent 1.1% of total sleep time in Stage N1, 95.1% in Stage N2, 0.0% in Stages N3, and 3.8% in REM.   Sleep latency was 1.5 minutes.  REM latency was 129.5 minutes.  Sleep  Efficiency was 79.4%.  Wake after Sleep Onset time was 46.5 minutes.  Respiratory Events During the diagnostic portion of the study, the polysomnogram revealed a presence of 8 obstructive, - central, and - mixed apneas resulting in an Apnea index of 3.5 events per hour.  There were 146 hypopneas (>=3% desaturation and/or arousal) resulting in an Apnea\Hypopnea Index (AHI >=3% desaturation and/or arousal) of 68.2 events per hour.  There were 106 hypopneas (>=4% desaturation) resulting in an Apnea\Hypopnea Index (AHI >=4% desaturation) of 50.5 events per hour.  There were - Respiratory Effort Related Arousals resulting in a RERA index of - events per hour. The Respiratory Disturbance Index is 68.2 events per hour.  The snore index was - events per hour.  Mean oxygen saturation was 78.7%.  The lowest oxygen saturation during sleep was 57.0%.  Time spent <=88% oxygen saturation was 168.2 minutes (98.4%).  End Tidal CO2 during sleep ranged from - to - mmHg. End Tidal CO2 was greater than 50 mmHg for - minutes and greater than 55 mmHg for - minutes.  During the treatment portion of the study, the polysomnogram revealed a presence of - obstructive, - central, and - mixed apneas resulting in an Apnea index of - events per hour.  There were 24 hypopneas (>=3% desaturation and/or arousal) resulting in an Apnea\Hypopnea Index (AHI >=3% desaturation and/or arousal) of 7.8 events per hour.  There were 21 hypopneas (>=4% desaturation) resulting in an Apnea\Hypopnea Index (AHI >=4%  desaturation) of 6.8 events per hour.  There were - Respiratory Effort Related Arousals resulting in a RERA index of - events per hour. The Respiratory Disturbance Index is 7.8 events per hour.  The snore index was - events per hour.  Mean oxygen saturation was 86.9%.  The lowest oxygen saturation during sleep was 74.0%.  Time spent <=88% oxygen saturation was 121.9 minutes (52.8%).  Limb Activity During the diagnostic portion of the study,  there were - limb movements recorded.  Of this total, - were classified as PLMs.  Of the PLMs, - were associated with arousals.  The Limb Movement index was - per hour while the PLM index was - per hour.  During the treatment portion of the study, there were - limb movements recorded.  Of this total, - were classified as PLMs.  Of the PLMs, - were associated with arousals.  The Limb Movement index was - per hour while the PLM index was - per hour.  Cardiac Summary During the diagnostic portion of the study, the average pulse rate was 83.4 bpm.  The minimum pulse rate was 65.0 bpm while the maximum pulse rate was 100.0 bpm.  During the treatment portion of the study, the average pulse rate was 77.2 bpm.  The minimum pulse rate was 46.0 bpm while the maximum pulse rate was 102.0 bpm.   Comment: Severe obstructive sleep apnea, AHI (4%) 50.5/hr. snoring with desaturation to a nadir of 57% and mean 78.7%. Successful CPAP titration to 15 cwp, residual AHI 0/hr. Supplemental O2 was provided per protocol for persistent saturation below 88%. On CPAP 15, with O2 3L, minimum O2 saturation 89% and mean 89.2%.  Diagnosis: Obstructive sleep apnea. Nocturnal Hypoxemia.  Recommendations: CPAP autopap 10-20, or CPAP 15.  Supplemental O2 3-4L blended through CPAP during sleep. Patient wore a large F&P Simplus full face mask with heated humidification.   This study was personally reviewed and electronically signed by: Rosa College MD Accredited Board Certified in Sleep Medicine Date/Time: 06/27/23 2:08       Split Night Report  Patient Name: Dillon, Mcgee Date: 06/19/2023  Date of Birth: December 06, 1960 Study Type: Split Night  Age: 63 year MRN #: [id number]  Sex: Male Interpreting Physician: Rosa College [id number]  Height: 6\' 3"  Referring Physician: Joaquin Mulberry MD  Weight: 350.0 lbs Recording Tech: Metro Acron RPSGT RST  BMI: 44.0 Scoring Tech: Metro Acron RPSGT RST  ESS: 3 Neck Size: 18   Mask Type Simplus Full Face Mask Final Pressure: 15cmH20  Mask Size: Large Supplemental O2: 3 Liters   Study Overview  DIAGNOSTIC TREATMENT  Lights Off: 09:34:03 PM Lights Off: 12:28:00 AM  Lights On: 12:28:00 AM Lights On: 04:20:15 AM  Time in Bed: 173.9 min. Time in Bed: 232.3 min.  Total Sleep Time: 135.5 min. Total Sleep Time: 184.5 min.  Sleep Efficiency: 77.9% Sleep Efficiency: 79.4%  Sleep Latency: 9.4 min. Sleep Latency: 1.5 min.  REM Latency from Sleep Onset: - min. REM Latency from Sleep Onset: 129.5 min.  Wake After Sleep Onset: 29.0 min. Wake After Sleep Onset: 46.5 min.   DIAGNOSTIC TREATMENT   Count Index  Count Index  Awakenings: 10 4.4 Awakenings: 4 1.3  Arousals: 7 3.1 Arousals: 3 1.0  AHI (>=3% Desat and/or Ar.): 154 68.2 AHI (>=3% Desat and/or Ar.): 24 7.8  AHI (>=4% Desat): 114 50.5 AHI (>=4% Desat): 21 6.8   Limb Movements: - - Limb Movements: - -  Snore: - - Snore: - -  Desaturations:  161 71.3 Desaturations: 26 8.5  Minimum SpO2 TST: 57.0% Minimum SpO2 TST: 74.0%    Sleep Architecture   DIAGNOSTIC TREATMENT ENTIRE NIGHT  Stages Time (mins) % Sleep Time Time (mins) % Sleep Time Time (mins) % Sleep Time  Wake 38.5  48.0  86.5   Stage N1 7.0 5.2% 2.0 1.1% 9.0 2.8%  Stage N2 128.5 94.8% 175.5 95.1% 304.0 95.0%  Stage N3 0.0 0.0% 0.0 0.0% 0.0 0.0%  REM 0.0 0.0% 7.0 3.8% 7.0 2.2%   Arousal Summary   DIAGNOSTIC TREATMENT   NREM REM TST Index NREM REM TST Index  Respiratory Ar. 7 - 7 3.1 - - - -  PLM Ar. - - - - - - - -  Isolated Limb Movement Ar. - - - - - - - -  Snore Ar. - - - - - - - -  Spontaneous Ar. - - - - 3 - 3 1.0  Total Ar. 7 - 7 3.1 3 - 3 1.0    Respiratory Summary  DIAGNOSTIC By Sleep Stage By Body Position Total   NREM REM Supine Non-Supine   Time (min) 135.5 0.0 45.0 90.5 135.5         Obstructive Apnea 8 - 2 6 8   Mixed Apnea - - - - -  Central Apnea - - - - -  Total Apneas 8 - 2 6 8   Total Apnea Index 3.5 - 2.7 4.0 3.5          Hypopneas (>=3% Desat and/or Ar.) 146 - 64 82 146  AHI (>=3% Desat and/or Ar.) 68.2 - 88.0 58.3 68.2         Hypopneas (>=4% Desat) 106 - 51 55 106  AHI (>=4% Desat) 50.5 - 70.7 40.4 50.5          RERAs - - - - -  RERA Index - - - - -         RDI 68.2 - 88.0 58.3 68.2    TREATMENT By Sleep Stage By Body Position Total   NREM REM Supine Non-Supine   Time (min) 177.5 7.0 25.5 159.0 184.5         Obstructive Apnea - - - - -  Mixed Apnea - - - - -  Central Apnea - - - - -  Total Apneas - - - - -  Total Apnea Index - - - - -         Hypopneas (>=3% Desat and/or Ar.) 23 1 4 20 24   AHI (>=3% Desat and/or Ar.) 7.8 8.6 9.4 7.5 7.8         Hypopneas (>=4% Desat) 20 1 3 18 21   AHI (>=4% Desat) 6.8 8.6 7.1 6.8 6.8          RERAs - - - - -  RERA Index - - - - -         RDI 7.8 8.6 9.4 7.5 7.8    Respiratory Event Durations   DIAGNOSTIC TREATMENT  Apnea NREM REM NREM REM  Average (seconds) 18.1 - - -  Maximum (seconds) 22.8 - - -  Hypopnea      Average (seconds) 23.9 - 30.8 32.4  Maximum (seconds) 41.3 - 62.0 32.4    Limb Movement Summary   DIAGNOSTIC TREATMENT   Count Index Count Index  Isolated Limb Movements - - - -  Periodic Limb Movements (PLMs) - - - -  Total Limb Movements - - - -    Oxygen  Saturation Summary   DIAGNOSTIC TREATMENT   Wake NREM REM TST Wake NREM REM TST  Average SpO2 85.7% 76.9% - 76.9% 89.1% 86.3% 86.2% 86.3%  Minimum SpO2 64.0% 57.0% - 57.0%  79.0% 74.0% 84.0% 74.0%   Maximum SpO2 90.0% 90.0% - 90.0%  96.0% 93.0% 90.0% 93.0%    DIAGNOSTIC Oxygen Saturation Distribution  Range (%) Time in range (min) Time in range (%)   90.0 - 100.0 - -  80.0 - 90.0 64.3 37.7%  70.0 - 80.0 98.2 57.5%  60.0 - 70.0 8.1 4.7%  50.0 - 60.0 0.2 0.1%  0.0 - 50.0 - -  Time Spent <=88% SpO2  Range (%) Time in range (min) Time in range (%)  0.0 - 88.0 168.2 98.4%      Count Index  Desaturations: 161 71.3   TREATMENT Oxygen Saturation  Distribution  Range (%) Time in range (min) Time in range (%)   90.0 - 100.0 24.4 10.5%  80.0 - 90.0 175.6 76.0%  70.0 - 80.0 31.0 13.4%  60.0 - 70.0 - -  50.0 - 60.0 - -  0.0 - 50.0 - -  Time Spent <=88% SpO2  Range (%) Time in range (min) Time in range (%)  0.0 - 88.0 121.9 52.8%      Count Index  Desaturations: 26 8.5     Cardiac Summary   DIAGNOSTIC TREATMENT   Wake NREM REM Total Wake NREM REM Total  Average Pulse Rate (BPM) 77.7 84.9 - 83.4 78.2 76.7 83.3 77.2  Minimum Pulse Rate (BPM) 65.0 66.0 - 65.0 46.0 60.0 67.0 46.0  Maximum Pulse Rate (BPM) 100.0 97.0 - 100.0 102.0 90.0 89.0 102.0   Pulse Rate Distribution   DIAGNOSTIC  Range (bpm) Time in range (min) Time in range (%)  0.0 - 40.0 - -  40.0 - 60.0 - -  60.0 - 80.0 47.5 27.8%  80.0 - 100.0 123.4 72.2%  100.0 - 120.0 - -  120.0 - 140.0 - -  140.0 - 200.0 - -   TREATMENT  Range (bpm) Time in range (min) Time in range (%)  0.0 - 40.0 - -  40.0 - 60.0 0.1 0.0%  60.0 - 80.0 176.4 76.5%  80.0 - 100.0 53.7 23.3%  100.0 - 120.0 0.5 0.2%  120.0 - 140.0 - -  140.0 - 200.0 - -   EtCO2 Summary - Diagnostic  Stage Min (mmHg) Average (mmHg) Max (mmHg)  Wake - - -  NREM(1+2+3) - - -  REM - - -   EtCO2 Distribution:  Range (mmHg) Time in range (min) Time in range (%)  20.0 - 40.0 - -  40.0 - 50.0 - -  50.0 - 100.0 - -  55.0 - 100.0 - -  Excluded data <20.0 & >65.0 174.0 100.0%   Titration Summary  PAP Device PAP Level O2 Level Time (min) Wake (min) NREM (min) REM (min) Sleep Eff% OA# CA# MA# Hyp# (>=3%) AHI (>=3%) Hyp# (>=4%) AHI (>=%4) RERA RDI OSat <=88% (min) Min [employer] Ar. Index  - Off - 174.0 38.5 135.5 0.0 77.9% 8 - - 146 68.2 106  50.5 -  68.2  134.8 57.0 76.9 3.1  CPAP 5 - 16.0 15.0 1.0 0.0 6.3% - - - - - -  - -  -  0.1 88.0 89.1 -  CPAP 7 - 14.0 14.0 0.0 0.0 0.0%               CPAP 9 - 37.0  14.5 22.5 0.0 60.8% - - - 17 45.3 15  40.0 -  45.3  20.6 74.0 81.0 -  CPAP 11 - 25.0 4.0  21.0 0.0 84.0% - - - 1 2.9 1  2.9 -  2.9  21.0 74.0 79.7 -  CPAP 11 O2: 2 1.0 0.5 0.5 0.0 50.0% - - - - - -  - -  -  0.1 88.0 89.2 -  CPAP 13 O2: 2 30.5 0.0 30.5 0.0 100.0% - - - - - -  - -  -  30.5 85.0 85.8 -  CPAP 13 O2: 3 4.0 0.0 4.0 0.0 100.0% - - - 1 15.0 1  15.0 -  15.0  4.0 84.0 85.7 15.0  CPAP 14 O2: 3 98.5 0.0 91.5 7.0 100.0% - - - 5 3.0 4  2.4 -  3.0  26.9 81.0 88.9 1.2  CPAP 15 O2: 3 6.5 0.0 6.5 0.0 100.0% - - - - - -  - -  -  0.0 89.0 89.2 -    Hypnograms                           Technologist Comments  STUDY TYPE= SPLIT NIGHT STUDY MASK= SIMPLUS FULL FACE MASK SIZE= LARGE FINAL PRESSURE= 15CMH20 OXYGEN= 3 LITERS  ASSOCIATED DIAGNOSES WAS SUSPECTED OSA. SPLIT NGITH STUDY WAS ORDERED.  TECH EXPLAINED THE PROCESS OF HOOK UP AND WIRES. PATIENT EXPRESSED CLEAR UNDERSTANDING. HOOK UP WAS SMOOTH. TECH OBSERVED MODERATE TO SEVERE RESPIRATORY EVENTS AS THE STDUY PROGRESSED. SPLIT WAS INITIATED. PRESSURE WAS INCREASED ACCORDING TO PROTOCOL. 3 LITERS OF OXYGEN WAS APPLIED DUE TO LOW SATURATION . OPTIMAL PRESSURE WAS REACHED AT [id number]. PATIENT SLEPT RIGHT AND SUPINE. NO SIGNIFICANT PLMs. BATHROOM BREAK WAS TAKING ONE TIME. PATIENT TOLERATED THE MASK -TECH                      Lemoine Goyne Garret Kales, Biomedical engineer of Sleep Medicine  ELECTRONICALLY SIGNED ON:  06/27/2023, 1:56 PM [facility] PH: [phone]   FX: [phone] ACCREDITED BY THE AMERICAN ACADEMY OF SLEEP MEDICINE

## 2023-06-29 ENCOUNTER — Other Ambulatory Visit: Payer: Self-pay

## 2023-07-02 ENCOUNTER — Other Ambulatory Visit: Payer: Self-pay

## 2023-07-09 ENCOUNTER — Other Ambulatory Visit: Payer: Self-pay

## 2023-07-09 ENCOUNTER — Other Ambulatory Visit: Payer: Self-pay | Admitting: Family Medicine

## 2023-07-09 DIAGNOSIS — Z794 Long term (current) use of insulin: Secondary | ICD-10-CM

## 2023-07-09 DIAGNOSIS — Z7984 Long term (current) use of oral hypoglycemic drugs: Secondary | ICD-10-CM

## 2023-07-13 ENCOUNTER — Other Ambulatory Visit: Payer: Self-pay

## 2023-07-20 ENCOUNTER — Other Ambulatory Visit: Payer: Self-pay

## 2023-07-21 ENCOUNTER — Other Ambulatory Visit: Payer: Self-pay

## 2023-07-23 NOTE — Telephone Encounter (Signed)
 Called to follow up on status. PAP: Patient assistance application for REPATHA  has been approved by PAP Companies: [employer] from 06/29/23 to 06/28/23. Medication should be delivered to PAP Delivery: Home. For further shipping updates, please contact [employer]. Patient ID is: [id number]  Shipped on 4/29. Will dispense again on 08/31/23.

## 2023-07-24 ENCOUNTER — Other Ambulatory Visit: Payer: Self-pay | Admitting: Pharmacist

## 2023-07-24 ENCOUNTER — Other Ambulatory Visit: Payer: Self-pay

## 2023-07-24 DIAGNOSIS — E1142 Type 2 diabetes mellitus with diabetic polyneuropathy: Secondary | ICD-10-CM

## 2023-07-24 MED ORDER — GABAPENTIN 300 MG PO CAPS
300.0000 mg | ORAL_CAPSULE | Freq: Three times a day (TID) | ORAL | 6 refills | Status: DC
  Filled 2023-07-24 (×2): qty 90, 30d supply, fill #0
  Filled 2023-08-25 – 2023-08-26 (×2): qty 90, 30d supply, fill #1
  Filled 2023-09-22: qty 90, 30d supply, fill #2
  Filled 2023-09-22: qty 90, 30d supply, fill #0
  Filled 2023-10-21: qty 90, 30d supply, fill #1
  Filled 2023-11-24 (×2): qty 90, 30d supply, fill #2
  Filled 2023-12-21: qty 90, 30d supply, fill #3
  Filled 2024-01-26: qty 90, 30d supply, fill #4

## 2023-07-28 ENCOUNTER — Other Ambulatory Visit (HOSPITAL_COMMUNITY): Payer: Self-pay

## 2023-07-28 ENCOUNTER — Telehealth: Payer: Self-pay | Admitting: Licensed Clinical Social Worker

## 2023-07-28 NOTE — Telephone Encounter (Signed)
 H&V Care Navigation CSW Progress Note  Clinical Social Worker completed chart review to assess for need to assist with renewal of CAFA forms. Note pt previous assistance termed 07/22/23. Per chart [place], no need to renew assistance.   Note he was approved for Medication Assistance with [employer]- may need prior auth now that he has insurance. Will route to Rx Med Assistance.  Patient is participating in a Managed Medicaid Plan:  Yes- [employer]  SDOH Screenings   Food Insecurity: Food Insecurity Present (04/07/2023)  Housing: Unknown (04/07/2023)  Transportation Needs: No Transportation Needs (04/07/2023)  Utilities: Not At Risk (04/07/2023)  Alcohol Screen: Low Risk  (04/07/2023)  Depression (PHQ2-9): Low Risk  (05/20/2023)  Financial Resource Strain: Medium Risk (04/07/2023)  Physical Activity: Inactive (04/07/2023)  Social Connections: Moderately Isolated (04/07/2023)  Stress: Stress Concern Present (04/07/2023)  Tobacco Use: Low Risk  (06/09/2023)  Health Literacy: Adequate Health Literacy (04/07/2023)    Nathen Balder, MSW, LCSW Clinical Social Worker II [facility] Heart/Vascular Care Navigation  [phone]- work cell phone (preferred)

## 2023-08-11 ENCOUNTER — Other Ambulatory Visit (HOSPITAL_COMMUNITY): Payer: Self-pay

## 2023-08-11 ENCOUNTER — Other Ambulatory Visit: Payer: Self-pay

## 2023-08-11 ENCOUNTER — Other Ambulatory Visit: Payer: Self-pay | Admitting: Family Medicine

## 2023-08-11 MED ORDER — MELOXICAM 15 MG PO TABS
15.0000 mg | ORAL_TABLET | Freq: Every day | ORAL | 0 refills | Status: DC
  Filled 2023-08-11 – 2023-08-12 (×2): qty 90, 90d supply, fill #0

## 2023-08-12 ENCOUNTER — Other Ambulatory Visit: Payer: Self-pay

## 2023-08-12 ENCOUNTER — Other Ambulatory Visit (HOSPITAL_COMMUNITY): Payer: Self-pay

## 2023-08-26 ENCOUNTER — Other Ambulatory Visit: Payer: Self-pay

## 2023-08-26 ENCOUNTER — Other Ambulatory Visit (HOSPITAL_COMMUNITY): Payer: Self-pay

## 2023-09-22 ENCOUNTER — Other Ambulatory Visit: Payer: Self-pay

## 2023-09-22 ENCOUNTER — Other Ambulatory Visit: Payer: Self-pay | Admitting: Pharmacist

## 2023-09-22 ENCOUNTER — Other Ambulatory Visit (HOSPITAL_COMMUNITY): Payer: Self-pay

## 2023-09-22 ENCOUNTER — Other Ambulatory Visit: Payer: Self-pay | Admitting: Family Medicine

## 2023-09-22 DIAGNOSIS — E1142 Type 2 diabetes mellitus with diabetic polyneuropathy: Secondary | ICD-10-CM

## 2023-09-22 MED ORDER — ACCU-CHEK SOFTCLIX LANCETS MISC
6 refills | Status: AC
  Filled 2023-09-22: qty 100, fill #0
  Filled 2023-09-22: qty 100, 33d supply, fill #0

## 2023-09-22 MED ORDER — ACCU-CHEK GUIDE W/DEVICE KIT
PACK | 0 refills | Status: AC
Start: 2023-09-22 — End: ?
  Filled 2023-09-22: qty 1, 30d supply, fill #0
  Filled 2023-09-22: qty 1, fill #0

## 2023-09-22 MED ORDER — TRUE METRIX BLOOD GLUCOSE TEST VI STRP
ORAL_STRIP | 0 refills | Status: AC
  Filled 2023-09-22 (×2): qty 100, 33d supply, fill #0

## 2023-09-23 ENCOUNTER — Other Ambulatory Visit: Payer: Self-pay

## 2023-09-28 ENCOUNTER — Other Ambulatory Visit: Payer: Self-pay

## 2023-10-01 ENCOUNTER — Other Ambulatory Visit: Payer: Self-pay

## 2023-10-15 ENCOUNTER — Other Ambulatory Visit: Payer: Self-pay

## 2023-10-19 ENCOUNTER — Other Ambulatory Visit: Payer: Self-pay | Admitting: Family Medicine

## 2023-10-19 ENCOUNTER — Other Ambulatory Visit: Payer: Self-pay

## 2023-10-19 DIAGNOSIS — Z7984 Long term (current) use of oral hypoglycemic drugs: Secondary | ICD-10-CM

## 2023-10-19 DIAGNOSIS — E1142 Type 2 diabetes mellitus with diabetic polyneuropathy: Secondary | ICD-10-CM

## 2023-10-19 MED ORDER — EMPAGLIFLOZIN 25 MG PO TABS
25.0000 mg | ORAL_TABLET | Freq: Every day | ORAL | 0 refills | Status: DC
  Filled 2023-10-19 – 2023-10-20 (×2): qty 90, 90d supply, fill #0

## 2023-10-20 ENCOUNTER — Other Ambulatory Visit (HOSPITAL_COMMUNITY): Payer: Self-pay

## 2023-10-20 ENCOUNTER — Other Ambulatory Visit: Payer: Self-pay

## 2023-10-21 ENCOUNTER — Other Ambulatory Visit: Payer: Self-pay

## 2023-10-21 ENCOUNTER — Other Ambulatory Visit (HOSPITAL_COMMUNITY): Payer: Self-pay

## 2023-10-22 ENCOUNTER — Telehealth: Payer: Self-pay

## 2023-10-22 NOTE — Telephone Encounter (Signed)
 Pharmacy Patient Advocate Encounter  Received notification from [facility] that Prior Authorization for JARDIANCE  has been APPROVED from 10/20/2023 to 10/19/2024   PA #/Case ID/Reference #: [id number]

## 2023-11-16 ENCOUNTER — Other Ambulatory Visit (HOSPITAL_COMMUNITY): Payer: Self-pay

## 2023-11-23 ENCOUNTER — Other Ambulatory Visit (HOSPITAL_COMMUNITY): Payer: Self-pay

## 2023-11-23 ENCOUNTER — Other Ambulatory Visit: Payer: Self-pay

## 2023-11-23 ENCOUNTER — Ambulatory Visit: Attending: Family Medicine | Admitting: Family Medicine

## 2023-11-23 ENCOUNTER — Encounter: Payer: Self-pay | Admitting: Family Medicine

## 2023-11-23 VITALS — BP 128/69 | HR 65 | Ht 75.0 in | Wt 376.8 lb

## 2023-11-23 DIAGNOSIS — Z23 Encounter for immunization: Secondary | ICD-10-CM

## 2023-11-23 DIAGNOSIS — E785 Hyperlipidemia, unspecified: Secondary | ICD-10-CM

## 2023-11-23 DIAGNOSIS — Z1211 Encounter for screening for malignant neoplasm of colon: Secondary | ICD-10-CM

## 2023-11-23 DIAGNOSIS — Z125 Encounter for screening for malignant neoplasm of prostate: Secondary | ICD-10-CM

## 2023-11-23 DIAGNOSIS — Z794 Long term (current) use of insulin: Secondary | ICD-10-CM | POA: Diagnosis not present

## 2023-11-23 DIAGNOSIS — I8393 Asymptomatic varicose veins of bilateral lower extremities: Secondary | ICD-10-CM

## 2023-11-23 DIAGNOSIS — R351 Nocturia: Secondary | ICD-10-CM

## 2023-11-23 DIAGNOSIS — R600 Localized edema: Secondary | ICD-10-CM

## 2023-11-23 DIAGNOSIS — E1169 Type 2 diabetes mellitus with other specified complication: Secondary | ICD-10-CM

## 2023-11-23 DIAGNOSIS — N401 Enlarged prostate with lower urinary tract symptoms: Secondary | ICD-10-CM

## 2023-11-23 DIAGNOSIS — I152 Hypertension secondary to endocrine disorders: Secondary | ICD-10-CM

## 2023-11-23 DIAGNOSIS — E1142 Type 2 diabetes mellitus with diabetic polyneuropathy: Secondary | ICD-10-CM

## 2023-11-23 DIAGNOSIS — E1159 Type 2 diabetes mellitus with other circulatory complications: Secondary | ICD-10-CM | POA: Diagnosis not present

## 2023-11-23 DIAGNOSIS — Z7984 Long term (current) use of oral hypoglycemic drugs: Secondary | ICD-10-CM

## 2023-11-23 LAB — POCT GLYCOSYLATED HEMOGLOBIN (HGB A1C): HbA1c, POC (controlled diabetic range): 7.2 % — AB (ref 0.0–7.0)

## 2023-11-23 MED ORDER — MELOXICAM 15 MG PO TABS
15.0000 mg | ORAL_TABLET | Freq: Every day | ORAL | 0 refills | Status: AC
  Filled 2023-11-23 (×2): qty 90, 90d supply, fill #0

## 2023-11-23 MED ORDER — FINASTERIDE 5 MG PO TABS
5.0000 mg | ORAL_TABLET | Freq: Every day | ORAL | 1 refills | Status: AC
  Filled 2023-11-23 – 2023-12-21 (×3): qty 90, 90d supply, fill #0

## 2023-11-23 MED ORDER — EMPAGLIFLOZIN 25 MG PO TABS
25.0000 mg | ORAL_TABLET | Freq: Every day | ORAL | 1 refills | Status: AC
  Filled 2023-11-23 – 2024-01-14 (×2): qty 90, 90d supply, fill #0
  Filled 2024-01-17: qty 90, 90d supply, fill #1

## 2023-11-23 MED ORDER — AMLODIPINE BESYLATE 5 MG PO TABS
5.0000 mg | ORAL_TABLET | Freq: Every day | ORAL | 1 refills | Status: AC
  Filled 2023-11-23 – 2024-02-22 (×2): qty 90, 90d supply, fill #0

## 2023-11-23 MED ORDER — TAMSULOSIN HCL 0.4 MG PO CAPS
0.4000 mg | ORAL_CAPSULE | Freq: Every day | ORAL | 1 refills | Status: AC
Start: 2023-11-23 — End: ?
  Filled 2023-11-23 (×2): qty 90, 90d supply, fill #0

## 2023-11-23 NOTE — Patient Instructions (Signed)
 VISIT SUMMARY:  During your visit, we discussed the swelling in your left foot, your varicose veins, and your overall health management, including your diabetes and hyperlipidemia. We also reviewed your general health maintenance needs.  YOUR PLAN:  -LEFT LOWER EXTREMITY SWELLING AND VARICOSE VEINS: The swelling in your left foot is likely due to your varicose veins, which are veins that have become enlarged and twisted. This can be worsened by standing for long periods. We recommend continuing to use compression stockings, elevating your legs when possible, and considering reducing your sodium intake. Additionally, you may want to look into getting an exterminator to address the insect bites.  -TYPE 2 DIABETES MELLITUS: Your A1c level, which measures your average blood sugar over the past 2-3 months, has increased slightly from 6.9 to 7.2. This indicates that your blood sugar control has worsened a bit. We recommend continuing your current diabetes management plan and being mindful of your diet.  -HYPERLIPIDEMIA: Hyperlipidemia means you have high levels of fats (lipids) in your blood. You are currently managing this with Repatha  injections. We have ordered a fasting lipid panel to check your current lipid levels.  -GENERAL HEALTH MAINTENANCE: You are due for a colonoscopy and vaccinations. We have placed a referral for a colonoscopy and administered flu and pneumonia vaccinations.  INSTRUCTIONS:  Please schedule a follow-up appointment in 6 months to monitor your chronic conditions and ensure you are up-to-date with your screenings.

## 2023-11-23 NOTE — Progress Notes (Signed)
 Subjective:  Patient ID: Dillon Mcgee, male    DOB: [birth year]  Age: 63 y.o. MRN: [id number]  CC: Medical Management of Chronic Issues (Swelling in both feet)     Discussed the use of AI scribe software for clinical note transcription with the patient, who gave verbal consent to proceed.  History of Present Illness Dillon Mcgee is a 63 year old male with  hypertension, type 2 diabetes mellitus (A1c 6.8), hyperlipidemia, statin intolerance, obesity, Rosacea  who presents with swelling in the left foot.  Swelling is present in the front part of the left foot, attributed to insect bites during sleep. There is no associated pain or skin tightness.  Numbness in the feet is noted, occasionally affecting his gait.  Currently takes gabapentin  for neuropathy.  He experiences tightness and heaviness in the legs, especially when walking.  Of note he has complained of this chronically which led to discontinuation of his statin with a diagnosis of statin myopathy.  He is frequently on his feet. Currently takes Repatha  prescribed by cardiology.  He has diabetes with a recent A1c increase from 6.9 to 7.2. Blood glucose levels are generally well-controlled with some fluctuations and ranged between 60-110 with a single value of 192.  He is adherent with a diabetic diet.  His BPH is stable on Proscar .    Past Medical History:  Diagnosis Date   Diabetes mellitus without complication (HCC)    Hyperlipidemia    Hypertension     Past Surgical History:  Procedure Laterality Date   COLONOSCOPY     5 years ago     Family History  Problem Relation Age of Onset   Heart disease Maternal Aunt    Diabetes Maternal Uncle     Social History   Socioeconomic History   Marital status: Single    Spouse name: Not on file   Number of children: Not on file   Years of education: Not on file   Highest education level: Not on file  Occupational History   Not on file  Tobacco Use   Smoking status:  Never   Smokeless tobacco: Never  Vaping Use   Vaping status: Never Used  Substance and Sexual Activity   Alcohol use: Not Currently   Drug use: Never   Sexual activity: Not Currently    Partners: Female    Comment: SINGLE  Other Topics Concern   Not on file  Social History Narrative   Not on file   Social Drivers of Health   Financial Resource Strain: Medium Risk (04/07/2023)   Overall Financial Resource Strain (CARDIA)    Difficulty of Paying Living Expenses: Somewhat hard  Food Insecurity: Food Insecurity Present (04/07/2023)   Hunger Vital Sign    Worried About Running Out of Food in the Last Year: Never true    Ran Out of Food in the Last Year: Sometimes true  Transportation Needs: No Transportation Needs (04/07/2023)   PRAPARE - Administrator, Civil Service (Medical): No    Lack of Transportation (Non-Medical): No  Physical Activity: Inactive (04/07/2023)   Exercise Vital Sign    Days of Exercise per Week: 0 days    Minutes of Exercise per Session: 0 min  Stress: Stress Concern Present (04/07/2023)   [employer] of Occupational Health - Occupational Stress Questionnaire    Feeling of Stress : To some extent  Social Connections: Moderately Isolated (04/07/2023)   Social Connection and Isolation Panel    Frequency of Communication with  Friends and Family: Once a week    Frequency of Social Gatherings with Friends and Family: Never    Attends Religious Services: 1 to 4 times per year    Active Member of [employer] or Organizations: Yes    Attends Banker Meetings: Never    Marital Status: Never married    Allergies  Allergen Reactions   Lisinopril  Other (See Comments)    Hyperkalemia    Pioglitazone  Swelling    Lower extremity swelling.    Outpatient Medications Prior to Visit  Medication Sig Dispense Refill   Accu-Chek Softclix Lancets lancets Use to check blood sugar 3 times daily. 100 each 6   aspirin  EC 81 MG tablet Take 1 tablet (81 mg  total) daily by mouth. 90 tablet 3   Blood Glucose Monitoring Suppl (ACCU-CHEK GUIDE) w/Device KIT Use to check blood sugar 3 times daily. 1 kit 0   calcipotriene -betamethasone  (TACLONEX ) ointment Apply ointment to affected area(s) daily. 100 g 1   cholecalciferol (VITAMIN D3) 25 MCG (1000 UNIT) tablet Take 1,000 Units by mouth daily.     Dulaglutide  (TRULICITY ) 4.5 MG/0.5ML SOAJ Inject 4.5 mg into the skin once a week. 8 mL 6   Evolocumab  (REPATHA  SURECLICK) 140 MG/ML SOAJ Inject 140 mg into the skin every 14 (fourteen) days. 2 mL 5   ezetimibe  (ZETIA ) 10 MG tablet Take 1 tablet (10 mg total) by mouth daily. 90 tablet 1   ferrous sulfate 325 (65 FE) MG tablet Take 325 mg by mouth daily with breakfast.     gabapentin  (NEURONTIN ) 300 MG capsule Take 1 capsule (300 mg total) by mouth 3 (three) times daily. 90 capsule 6   glipiZIDE  (GLUCOTROL ) 10 MG tablet Take 1 tablet (10 mg total) by mouth in the morning AND 0.5 tablets (5 mg total) every evening. 45 tablet 6   glucose blood (TRUE METRIX BLOOD GLUCOSE TEST) test strip Use as instructed 3 times daily. 100 strip 0   Insulin  Glargine (BASAGLAR  KWIKPEN) 100 UNIT/ML Inject 38 Units into the skin 2 (two) times daily. 30 mL 6   Insulin  Pen Needle (TRUEPLUS 5-BEVEL PEN NEEDLES) 32G X 4 MM MISC use as directed to inject insulin  twice daily. 100 each 2   Insulin  Syringe-Needle U-100 (TRUEPLUS INSULIN  SYRINGE) 30G X 5/16 0.5 ML MISC Use as directed to inject Levemir  twice daily. 100 each 1   metroNIDAZOLE  (METROGEL ) 1 % gel Apply topically daily. 60 g 3   omega-3 acid ethyl esters (LOVAZA ) 1 g capsule TAKE 1 CAPSULE BY MOUTH 2 TIMES DAILY. 180 capsule 2   amLODipine  (NORVASC ) 5 MG tablet Take 1 tablet (5 mg total) by mouth daily. 90 tablet 1   empagliflozin  (JARDIANCE ) 25 MG TABS tablet Take 1 tablet (25 mg total) by mouth daily before breakfast. 90 tablet 0   finasteride  (PROSCAR ) 5 MG tablet Take 1 tablet (5 mg total) by mouth daily. 90 tablet 1    meloxicam  (MOBIC ) 15 MG tablet Take 1 tablet (15 mg total) by mouth daily. 90 tablet 0   tamsulosin  (FLOMAX ) 0.4 MG CAPS capsule Take 1 capsule (0.4 mg total) by mouth daily. 90 capsule 1   No facility-administered medications prior to visit.     ROS Review of Systems  Constitutional:  Negative for activity change and appetite change.  HENT:  Negative for sinus pressure and sore throat.   Respiratory:  Negative for chest tightness, shortness of breath and wheezing.   Cardiovascular:  Positive for leg swelling. Negative for  chest pain and palpitations.  Gastrointestinal:  Negative for abdominal distention, abdominal pain and constipation.  Genitourinary: Negative.   Musculoskeletal: Negative.   Psychiatric/Behavioral:  Negative for behavioral problems and dysphoric mood.     Objective:  BP 128/69   Pulse 65   Ht 6' 3 (1.905 m)   Wt (!) 376 lb 12.8 oz (170.9 kg)   SpO2 97%   BMI 47.10 kg/m      11/23/2023    8:41 AM 06/19/2023    7:30 PM 06/09/2023    8:17 AM  BP/Weight  Systolic BP 128    Diastolic BP 69    Wt. (Lbs) 376.8 350 359.4  BMI 47.1 kg/m2 43.75 kg/m2 43.75 kg/m2      Physical Exam Constitutional:      Appearance: He is well-developed.  Cardiovascular:     Rate and Rhythm: Normal rate.     Heart sounds: Normal heart sounds. No murmur heard. Pulmonary:     Effort: Pulmonary effort is normal.     Breath sounds: Normal breath sounds. No wheezing or rales.  Chest:     Chest wall: No tenderness.  Abdominal:     General: Bowel sounds are normal. There is no distension.     Palpations: Abdomen is soft. There is no mass.     Tenderness: There is no abdominal tenderness.  Musculoskeletal:        General: Normal range of motion.     Right lower leg: Edema present.     Left lower leg: Edema present.  Skin:    Comments: Varicose veins in bilateral lower extremities more pronounced in the left knee and left lower leg  Neurological:     Mental Status: He is  alert and oriented to person, place, and time.  Psychiatric:        Mood and Affect: Mood normal.        Latest Ref Rng & Units 04/07/2023    3:44 PM 11/19/2022   11:21 AM 04/17/2022    9:15 AM  CMP  Glucose 70 - 99 mg/dL 877  54  60   BUN 8 - 27 mg/dL 21  14  16    Creatinine 0.76 - 1.27 mg/dL 8.70  8.93  8.94   Sodium 134 - 144 mmol/L 145  147  146   Potassium 3.5 - 5.2 mmol/L 4.9  5.2  4.8   Chloride 96 - 106 mmol/L 102  104  106   CO2 20 - 29 mmol/L 28  22  25    Calcium  8.6 - 10.2 mg/dL 9.4  9.5  9.4   Total Protein 6.0 - 8.5 g/dL  7.4  6.7   Total Bilirubin 0.0 - 1.2 mg/dL  0.4  0.3   Alkaline Phos 44 - 121 IU/L  92  86   AST 0 - 40 IU/L  31  20   ALT 0 - 44 IU/L  19  24     Lipid Panel     Component Value Date/Time   CHOL 201 (H) 11/19/2022 1121   TRIG 115 11/19/2022 1121   HDL 61 11/19/2022 1121   CHOLHDL 4.4 04/17/2022 0915   CHOLHDL 3.5 06/07/2015 1005   VLDL 33 (H) 06/07/2015 1005   LDLCALC 120 (H) 11/19/2022 1121    CBC    Component Value Date/Time   WBC 4.7 10/22/2022 1035   WBC 7.4 12/03/2015 1704   RBC 4.72 10/22/2022 1035   RBC 4.17 (L) 12/03/2015 1704   HGB 14.6  10/22/2022 1035   HCT 44.8 10/22/2022 1035   PLT 210 10/22/2022 1035   MCV 95 10/22/2022 1035   MCH 30.9 10/22/2022 1035   MCH 30.2 12/03/2015 1704   MCHC 32.6 10/22/2022 1035   MCHC 32.4 12/03/2015 1704   RDW 11.8 10/22/2022 1035   LYMPHSABS 1.4 10/22/2022 1035   MONOABS 518 12/03/2015 1704   EOSABS 0.1 10/22/2022 1035   BASOSABS 0.0 10/22/2022 1035    Lab Results  Component Value Date   HGBA1C 7.2 (A) 11/23/2023    Lab Results  Component Value Date   HGBA1C 7.2 (A) 11/23/2023   HGBA1C 6.9 05/20/2023   HGBA1C 6.8 04/07/2023       Assessment & Plan Left lower extremity swelling and varicose veins Swelling likely due to varicose veins, exacerbated by frequent standing. Differential includes dependent edema although he complains of insects biting him at night - Recommend  compression stockings. - Advise leg elevation. - Discuss potential need for exterminator. - Educate on reducing sodium intake.  Type 2 diabetes mellitus A1c increased to 7.2 from 6.9. Blood glucose generally well-controlled with occasional fluctuations. - Continue current diabetes management. - Counseled on Diabetic diet, the healthy plate, 849 minutes of moderate intensity exercise/week Blood sugar logs with fasting goals of 80-120 mg/dl, random of less than 819 and in the event of sugars less than 60 mg/dl or greater than 599 mg/dl encouraged to notify the clinic. Advised on the need for annual eye exams, annual foot exams, Pneumonia vaccine.   Hyperlipidemia On Repatha , self-administered. -History of statin myopathy - Order fasting lipid panel.   BPH - Symptoms are controlled - Continue Proscar   Hypertension associated with type 2 diabetes mellitus - Controlled - Continue antihypertensives -Counseled on blood pressure goal of less than 130/80, low-sodium, DASH diet, medication compliance, 150 minutes of moderate intensity exercise per week. Discussed medication compliance, adverse effects.  General Health Maintenance Due for colonoscopy and vaccinations. - Place referral for colonoscopy. - Administer flu and pneumonia vaccinations.  Follow-up Requires follow-up for chronic conditions and screenings. - Schedule follow-up in 6 months.    Healthcare maintenance Screening for prostate cancer-PSA sent off Screening for colon cancer-referred for colonoscopy Need for influenza and pneumonia vaccine-PCV 20 and flu shot administered  Meds ordered this encounter  Medications   amLODipine  (NORVASC ) 5 MG tablet    Sig: Take 1 tablet (5 mg total) by mouth daily.    Dispense:  90 tablet    Refill:  1   empagliflozin  (JARDIANCE ) 25 MG TABS tablet    Sig: Take 1 tablet (25 mg total) by mouth daily before breakfast.    Dispense:  90 tablet    Refill:  1   finasteride   (PROSCAR ) 5 MG tablet    Sig: Take 1 tablet (5 mg total) by mouth daily.    Dispense:  90 tablet    Refill:  1   meloxicam  (MOBIC ) 15 MG tablet    Sig: Take 1 tablet (15 mg total) by mouth daily.    Dispense:  90 tablet    Refill:  0   tamsulosin  (FLOMAX ) 0.4 MG CAPS capsule    Sig: Take 1 capsule (0.4 mg total) by mouth daily.    Dispense:  90 capsule    Refill:  1    Follow-up: Return in about 6 months (around 05/22/2024) for Chronic medical conditions.       Corrina Sabin, MD, FAAFP. [facility] and [facility] [place], [place] [phone]  11/23/2023, 9:22 AM

## 2023-11-24 ENCOUNTER — Other Ambulatory Visit (HOSPITAL_COMMUNITY): Payer: Self-pay

## 2023-11-24 ENCOUNTER — Other Ambulatory Visit: Payer: Self-pay

## 2023-11-24 ENCOUNTER — Ambulatory Visit: Payer: Self-pay | Attending: Family Medicine

## 2023-11-24 DIAGNOSIS — E1142 Type 2 diabetes mellitus with diabetic polyneuropathy: Secondary | ICD-10-CM

## 2023-11-24 DIAGNOSIS — Z125 Encounter for screening for malignant neoplasm of prostate: Secondary | ICD-10-CM

## 2023-11-24 DIAGNOSIS — E1169 Type 2 diabetes mellitus with other specified complication: Secondary | ICD-10-CM

## 2023-11-24 DIAGNOSIS — N401 Enlarged prostate with lower urinary tract symptoms: Secondary | ICD-10-CM

## 2023-11-26 ENCOUNTER — Ambulatory Visit: Payer: Self-pay | Admitting: Family Medicine

## 2023-11-26 DIAGNOSIS — E875 Hyperkalemia: Secondary | ICD-10-CM

## 2023-11-26 LAB — MICROALBUMIN / CREATININE URINE RATIO
Creatinine, Urine: 55.1 mg/dL
Microalb/Creat Ratio: 45 mg/g{creat} — AB (ref 0–29)
Microalbumin, Urine: 24.6 ug/mL

## 2023-11-26 LAB — CMP14+EGFR
ALT: 16 IU/L (ref 0–44)
AST: 19 IU/L (ref 0–40)
Albumin: 4.1 g/dL (ref 3.9–4.9)
Alkaline Phosphatase: 95 IU/L (ref 47–123)
BUN/Creatinine Ratio: 14 (ref 10–24)
BUN: 15 mg/dL (ref 8–27)
Bilirubin Total: 0.4 mg/dL (ref 0.0–1.2)
CO2: 26 mmol/L (ref 20–29)
Calcium: 9.2 mg/dL (ref 8.6–10.2)
Chloride: 104 mmol/L (ref 96–106)
Creatinine, Ser: 1.1 mg/dL (ref 0.76–1.27)
Globulin, Total: 2.9 g/dL (ref 1.5–4.5)
Glucose: 74 mg/dL (ref 70–99)
Potassium: 5.6 mmol/L — ABNORMAL HIGH (ref 3.5–5.2)
Sodium: 144 mmol/L (ref 134–144)
Total Protein: 7 g/dL (ref 6.0–8.5)
eGFR: 76 mL/min/1.73 (ref >59)

## 2023-11-26 LAB — LP+NON-HDL CHOLESTEROL
Cholesterol, Total: 160 mg/dL (ref 100–199)
HDL: 56 mg/dL (ref >39)
LDL Chol Calc (NIH): 80 mg/dL (ref 0–99)
Total Non-HDL-Chol (LDL+VLDL): 104 mg/dL (ref 0–129)
Triglycerides: 139 mg/dL (ref 0–149)
VLDL Cholesterol Cal: 24 mg/dL (ref 5–40)

## 2023-11-26 LAB — PSA, TOTAL AND FREE
PSA, Free Pct: <20 %
PSA, Free: <0.02 ng/mL
Prostate Specific Ag, Serum: 0.1 ng/mL (ref 0.0–4.0)

## 2023-12-07 ENCOUNTER — Other Ambulatory Visit: Payer: Self-pay

## 2023-12-15 ENCOUNTER — Ambulatory Visit: Attending: Family Medicine

## 2023-12-15 DIAGNOSIS — E875 Hyperkalemia: Secondary | ICD-10-CM

## 2023-12-16 ENCOUNTER — Ambulatory Visit: Payer: Self-pay | Admitting: Internal Medicine

## 2023-12-16 LAB — POTASSIUM: Potassium: 5.4 mmol/L — ABNORMAL HIGH (ref 3.5–5.2)

## 2023-12-21 ENCOUNTER — Other Ambulatory Visit: Payer: Self-pay

## 2023-12-21 ENCOUNTER — Other Ambulatory Visit (HOSPITAL_COMMUNITY): Payer: Self-pay

## 2023-12-21 ENCOUNTER — Other Ambulatory Visit: Payer: Self-pay | Admitting: Family Medicine

## 2023-12-21 DIAGNOSIS — Z7984 Long term (current) use of oral hypoglycemic drugs: Secondary | ICD-10-CM

## 2023-12-21 DIAGNOSIS — E1142 Type 2 diabetes mellitus with diabetic polyneuropathy: Secondary | ICD-10-CM

## 2023-12-22 ENCOUNTER — Other Ambulatory Visit (HOSPITAL_COMMUNITY): Payer: Self-pay

## 2023-12-22 ENCOUNTER — Other Ambulatory Visit: Payer: Self-pay

## 2023-12-22 MED ORDER — GLIPIZIDE 10 MG PO TABS
ORAL_TABLET | ORAL | 6 refills | Status: AC
  Filled 2023-12-22: qty 45, 30d supply, fill #0
  Filled 2024-01-17: qty 45, 30d supply, fill #1
  Filled 2024-02-29: qty 45, 30d supply, fill #2

## 2024-01-04 ENCOUNTER — Other Ambulatory Visit: Payer: Self-pay

## 2024-01-14 ENCOUNTER — Ambulatory Visit: Payer: Self-pay

## 2024-01-14 ENCOUNTER — Other Ambulatory Visit: Payer: Self-pay

## 2024-01-14 NOTE — Telephone Encounter (Signed)
 Routing to PCP for review.

## 2024-01-14 NOTE — Telephone Encounter (Signed)
 He needs to eat regular meals and check his blood sugar when he is dizzy, that way we can know if he is hypoglycemic so we can adjust his medications . With the shortness of breath, he will need an office visit to evaluate that adequately.

## 2024-01-14 NOTE — Telephone Encounter (Signed)
 FYI Only or Action Required?: Action required by provider: request for appointment and update on patient condition.  Patient was last seen in primary care on 11/23/2023 by Dillon Mcgee, [name], MD.  Called Nurse Triage reporting dry throat, Eye Problem, and back pain.  Symptoms began several weeks ago.  Interventions attempted: Nothing.  Symptoms are: gradually improving.  Triage Disposition: See PCP Within 2 Weeks  Patient/caregiver understands and will follow disposition?: Unsure  Copied from CRM #[id number]. Topic: Clinical - Red Word Triage >> Jan 14, 2024  1:35 PM Donee H wrote: [facility] that prompted transfer to Nurse Triage: Patient states he feels weird. He states sometimes he loses his breath when he sits in car and then it goes away. Patient states he not sure if he is taking too much medication. He states been feeling weird for about a week and half. He is having dizzy spells and seem like objects are closer. He states he not sure if it the  empagliflozin  (JARDIANCE ) 25 MG TABS tablet or weight loss medication. Reason for Disposition  [1] Blurred vision or visual changes AND [2] gradual onset (e.g., weeks, months)  Answer Assessment - Initial Assessment Questions Patient has not been eating much since he is taking jardiance .  He has been purposely not eating.   He will begin eating regular meals and snacks and to see if this will help his symptoms.  Hesitant to schedule appt. Anywhere other than [facility].  1. DESCRIPTION: How has your vision changed? (e.g., complete vision loss, blurred vision, double vision, floaters, etc.)     Patient reports that his depth perception is off 2. LOCATION: One or both eyes? If one, ask: Which eye?     Both eyes 3. SEVERITY: Can you see anything? If Yes, ask: What can you see? (e.g., fine print)     Yes, able to see 4. ONSET: When did this begin? Did it start suddenly or has this been gradual?     About two weeks  ago 5. PATTERN: Does this come and go, or has it been constant since it started?     Noticed when driving and that cars seemed closer than normal, faster 6. PAIN: Is there any pain in your eye(s)?  (Scale 1-10; or mild, moderate, severe)     denies 7. CONTACTS-GLASSES: Do you wear contacts or glasses?     Has glasses, but does not use them.  8. CAUSE: What do you think is causing this visual problem?     Unsure, may be related to not having repatha  for two weeks or admittedly not eating while taking jardiance  9. OTHER SYMPTOMS: Do you have any other symptoms? (e.g., confusion, headache, arm or leg weakness, speech problems)     Dry mouth  Patient states that his vision has improved since he is eating more regularly  Protocols used: Vision Loss or Change-A-AH

## 2024-01-15 NOTE — Telephone Encounter (Signed)
 Patient has been informed.

## 2024-01-18 ENCOUNTER — Other Ambulatory Visit: Payer: Self-pay

## 2024-01-18 ENCOUNTER — Other Ambulatory Visit (HOSPITAL_COMMUNITY): Payer: Self-pay

## 2024-01-26 ENCOUNTER — Other Ambulatory Visit: Payer: Self-pay

## 2024-01-26 ENCOUNTER — Other Ambulatory Visit (HOSPITAL_COMMUNITY): Payer: Self-pay

## 2024-02-22 ENCOUNTER — Other Ambulatory Visit: Payer: Self-pay

## 2024-02-22 ENCOUNTER — Other Ambulatory Visit (HOSPITAL_COMMUNITY): Payer: Self-pay

## 2024-02-29 ENCOUNTER — Other Ambulatory Visit: Payer: Self-pay | Admitting: Family Medicine

## 2024-02-29 ENCOUNTER — Other Ambulatory Visit: Payer: Self-pay

## 2024-02-29 ENCOUNTER — Other Ambulatory Visit (HOSPITAL_COMMUNITY): Payer: Self-pay

## 2024-02-29 DIAGNOSIS — E1142 Type 2 diabetes mellitus with diabetic polyneuropathy: Secondary | ICD-10-CM

## 2024-03-01 MED ORDER — GABAPENTIN 300 MG PO CAPS
300.0000 mg | ORAL_CAPSULE | Freq: Three times a day (TID) | ORAL | 0 refills | Status: AC
  Filled 2024-03-01: qty 90, 30d supply, fill #0

## 2024-03-02 ENCOUNTER — Other Ambulatory Visit: Payer: Self-pay

## 2024-03-02 ENCOUNTER — Other Ambulatory Visit (HOSPITAL_COMMUNITY): Payer: Self-pay

## 2024-03-07 DIAGNOSIS — Z7985 Long-term (current) use of injectable non-insulin antidiabetic drugs: Secondary | ICD-10-CM

## 2024-03-07 DIAGNOSIS — Z794 Long term (current) use of insulin: Secondary | ICD-10-CM

## 2024-03-09 ENCOUNTER — Other Ambulatory Visit: Payer: Self-pay

## 2024-04-03 DEATH — deceased

## 2024-05-24 ENCOUNTER — Ambulatory Visit: Admitting: Family Medicine
